# Patient Record
Sex: Female | Born: 1975 | Race: Black or African American | Hispanic: No | Marital: Single | Smoking: Former smoker
Health system: Southern US, Community
[De-identification: ages and names within clinical notes are randomized; demographics above are authoritative.]

## PROBLEM LIST (undated history)

## (undated) DIAGNOSIS — I10 Essential (primary) hypertension: Secondary | ICD-10-CM

## (undated) DIAGNOSIS — E119 Type 2 diabetes mellitus without complications: Secondary | ICD-10-CM

## (undated) DIAGNOSIS — E785 Hyperlipidemia, unspecified: Secondary | ICD-10-CM

## (undated) DIAGNOSIS — F319 Bipolar disorder, unspecified: Secondary | ICD-10-CM

## (undated) DIAGNOSIS — F32A Depression, unspecified: Secondary | ICD-10-CM

## (undated) DIAGNOSIS — F429 Obsessive-compulsive disorder, unspecified: Secondary | ICD-10-CM

## (undated) DIAGNOSIS — F419 Anxiety disorder, unspecified: Secondary | ICD-10-CM

## (undated) HISTORY — PX: OTHER SURGICAL HISTORY: SHX169

## (undated) HISTORY — DX: Type 2 diabetes mellitus without complications: E11.9

## (undated) HISTORY — DX: Essential (primary) hypertension: I10

## (undated) HISTORY — DX: Hyperlipidemia, unspecified: E78.5

## (undated) HISTORY — DX: Depression, unspecified: F32.A

---

## 1997-06-18 ENCOUNTER — Emergency Department (HOSPITAL_COMMUNITY): Admission: EM | Admit: 1997-06-18 | Discharge: 1997-06-18 | Payer: Self-pay | Admitting: Emergency Medicine

## 1997-07-21 ENCOUNTER — Emergency Department (HOSPITAL_COMMUNITY): Admission: EM | Admit: 1997-07-21 | Discharge: 1997-07-21 | Payer: Self-pay | Admitting: Emergency Medicine

## 1997-07-21 ENCOUNTER — Emergency Department (HOSPITAL_COMMUNITY): Admission: EM | Admit: 1997-07-21 | Discharge: 1997-07-21 | Payer: Self-pay | Admitting: *Deleted

## 1998-03-07 DIAGNOSIS — F259 Schizoaffective disorder, unspecified: Secondary | ICD-10-CM

## 1998-03-07 HISTORY — DX: Schizoaffective disorder, unspecified: F25.9

## 1998-05-18 ENCOUNTER — Emergency Department (HOSPITAL_COMMUNITY): Admission: EM | Admit: 1998-05-18 | Discharge: 1998-05-18 | Payer: Self-pay | Admitting: Emergency Medicine

## 1998-07-18 ENCOUNTER — Emergency Department (HOSPITAL_COMMUNITY): Admission: EM | Admit: 1998-07-18 | Discharge: 1998-07-18 | Payer: Self-pay | Admitting: Emergency Medicine

## 1998-08-28 ENCOUNTER — Inpatient Hospital Stay (HOSPITAL_COMMUNITY): Admission: RE | Admit: 1998-08-28 | Discharge: 1998-09-07 | Payer: Self-pay | Admitting: *Deleted

## 1998-10-28 ENCOUNTER — Ambulatory Visit (HOSPITAL_COMMUNITY): Admission: RE | Admit: 1998-10-28 | Discharge: 1998-10-28 | Payer: Self-pay | Admitting: Internal Medicine

## 1998-10-28 ENCOUNTER — Encounter: Payer: Self-pay | Admitting: Internal Medicine

## 1999-10-14 ENCOUNTER — Encounter: Payer: Self-pay | Admitting: Emergency Medicine

## 1999-10-14 ENCOUNTER — Emergency Department (HOSPITAL_COMMUNITY): Admission: EM | Admit: 1999-10-14 | Discharge: 1999-10-14 | Payer: Self-pay | Admitting: Emergency Medicine

## 2000-06-09 ENCOUNTER — Other Ambulatory Visit: Admission: RE | Admit: 2000-06-09 | Discharge: 2000-06-09 | Payer: Self-pay | Admitting: Obstetrics and Gynecology

## 2000-11-13 ENCOUNTER — Emergency Department (HOSPITAL_COMMUNITY): Admission: EM | Admit: 2000-11-13 | Discharge: 2000-11-13 | Payer: Self-pay | Admitting: *Deleted

## 2002-03-28 ENCOUNTER — Other Ambulatory Visit: Admission: RE | Admit: 2002-03-28 | Discharge: 2002-03-28 | Payer: Self-pay | Admitting: Obstetrics and Gynecology

## 2002-04-14 ENCOUNTER — Emergency Department (HOSPITAL_COMMUNITY): Admission: EM | Admit: 2002-04-14 | Discharge: 2002-04-14 | Payer: Self-pay | Admitting: Emergency Medicine

## 2004-02-09 ENCOUNTER — Emergency Department (HOSPITAL_COMMUNITY): Admission: EM | Admit: 2004-02-09 | Discharge: 2004-02-09 | Payer: Self-pay | Admitting: Emergency Medicine

## 2004-02-12 ENCOUNTER — Emergency Department (HOSPITAL_COMMUNITY): Admission: EM | Admit: 2004-02-12 | Discharge: 2004-02-13 | Payer: Self-pay | Admitting: Emergency Medicine

## 2004-03-13 ENCOUNTER — Emergency Department (HOSPITAL_COMMUNITY): Admission: EM | Admit: 2004-03-13 | Discharge: 2004-03-13 | Payer: Self-pay | Admitting: Emergency Medicine

## 2005-01-20 ENCOUNTER — Ambulatory Visit: Payer: Self-pay | Admitting: Psychiatry

## 2005-01-20 ENCOUNTER — Inpatient Hospital Stay (HOSPITAL_COMMUNITY): Admission: EM | Admit: 2005-01-20 | Discharge: 2005-01-25 | Payer: Self-pay | Admitting: Psychiatry

## 2005-03-28 ENCOUNTER — Ambulatory Visit: Payer: Self-pay | Admitting: Internal Medicine

## 2005-12-25 ENCOUNTER — Inpatient Hospital Stay (HOSPITAL_COMMUNITY): Admission: EM | Admit: 2005-12-25 | Discharge: 2006-01-02 | Payer: Self-pay | Admitting: *Deleted

## 2005-12-25 ENCOUNTER — Ambulatory Visit: Payer: Self-pay | Admitting: *Deleted

## 2006-06-16 ENCOUNTER — Inpatient Hospital Stay (HOSPITAL_COMMUNITY): Admission: AD | Admit: 2006-06-16 | Discharge: 2006-06-21 | Payer: Self-pay | Admitting: *Deleted

## 2006-06-16 ENCOUNTER — Ambulatory Visit: Payer: Self-pay | Admitting: *Deleted

## 2006-11-29 ENCOUNTER — Ambulatory Visit: Payer: Self-pay | Admitting: *Deleted

## 2006-11-29 ENCOUNTER — Inpatient Hospital Stay (HOSPITAL_COMMUNITY): Admission: AD | Admit: 2006-11-29 | Discharge: 2006-12-06 | Payer: Self-pay | Admitting: *Deleted

## 2007-02-23 ENCOUNTER — Ambulatory Visit: Payer: Self-pay | Admitting: Psychiatry

## 2007-02-23 ENCOUNTER — Inpatient Hospital Stay (HOSPITAL_COMMUNITY): Admission: AD | Admit: 2007-02-23 | Discharge: 2007-03-01 | Payer: Self-pay | Admitting: Psychiatry

## 2007-10-17 ENCOUNTER — Ambulatory Visit: Payer: Self-pay | Admitting: Obstetrics & Gynecology

## 2007-10-17 ENCOUNTER — Encounter: Payer: Self-pay | Admitting: Obstetrics & Gynecology

## 2008-07-30 ENCOUNTER — Ambulatory Visit: Payer: Self-pay | Admitting: Gastroenterology

## 2010-07-18 ENCOUNTER — Emergency Department (HOSPITAL_COMMUNITY)
Admission: EM | Admit: 2010-07-18 | Discharge: 2010-07-19 | Disposition: A | Discharge status: Other Acute Inpatient Hospital | Payer: Medicare Other | Source: Home / Self Care | Attending: Emergency Medicine | Admitting: Emergency Medicine

## 2010-07-18 DIAGNOSIS — F29 Unspecified psychosis not due to a substance or known physiological condition: Secondary | ICD-10-CM | POA: Insufficient documentation

## 2010-07-18 LAB — COMPREHENSIVE METABOLIC PANEL
ALT: 14 U/L (ref 0–35)
AST: 20 U/L (ref 0–37)
Albumin: 4.6 g/dL (ref 3.5–5.2)
Alkaline Phosphatase: 59 U/L (ref 39–117)
BUN: 19 mg/dL (ref 6–23)
CO2: 22 mEq/L (ref 19–32)
Calcium: 9.2 mg/dL (ref 8.4–10.5)
Chloride: 103 mEq/L (ref 96–112)
Creatinine, Ser: 1.2 mg/dL (ref 0.4–1.2)
GFR calc Af Amer: >60 mL/min (ref >60)
GFR calc non Af Amer: 52 mL/min — ABNORMAL LOW (ref >60)
Glucose, Bld: 164 mg/dL — ABNORMAL HIGH (ref 70–99)
Potassium: 3.6 mEq/L (ref 3.5–5.1)
Sodium: 138 mEq/L (ref 135–145)
Total Bilirubin: 0.3 mg/dL (ref 0.3–1.2)
Total Protein: 8.3 g/dL (ref 6.0–8.3)

## 2010-07-18 LAB — DIFFERENTIAL
Basophils Absolute: 0 10*3/uL (ref 0.0–0.1)
Basophils Relative: 0 % (ref 0–1)
Eosinophils Absolute: 0.2 10*3/uL (ref 0.0–0.7)
Eosinophils Relative: 2 % (ref 0–5)
Lymphocytes Relative: 37 % (ref 12–46)
Lymphs Abs: 3.4 10*3/uL (ref 0.7–4.0)
Monocytes Absolute: 0.9 10*3/uL (ref 0.1–1.0)
Monocytes Relative: 10 % (ref 3–12)
Neutro Abs: 4.6 10*3/uL (ref 1.7–7.7)
Neutrophils Relative %: 51 % (ref 43–77)

## 2010-07-18 LAB — ETHANOL: Alcohol, Ethyl (B): <11 mg/dL — ABNORMAL HIGH (ref 0–10)

## 2010-07-18 LAB — CBC
HCT: 39.6 % (ref 36.0–46.0)
Hemoglobin: 13.9 g/dL (ref 12.0–15.0)
MCH: 30.3 pg (ref 26.0–34.0)
MCHC: 35.1 g/dL (ref 30.0–36.0)
MCV: 86.5 fL (ref 78.0–100.0)
Platelets: 286 10*3/uL (ref 150–400)
RBC: 4.58 MIL/uL (ref 3.87–5.11)
RDW: 13.3 % (ref 11.5–15.5)
WBC: 9 10*3/uL (ref 4.0–10.5)

## 2010-07-19 ENCOUNTER — Inpatient Hospital Stay (HOSPITAL_COMMUNITY)
Admission: AD | Admit: 2010-07-19 | Discharge: 2010-07-27 | DRG: 885 | Disposition: A | Discharge status: Home / Self Care | Payer: Medicare Other | Attending: Psychiatry | Admitting: Psychiatry

## 2010-07-19 DIAGNOSIS — F29 Unspecified psychosis not due to a substance or known physiological condition: Secondary | ICD-10-CM

## 2010-07-19 DIAGNOSIS — F259 Schizoaffective disorder, unspecified: Secondary | ICD-10-CM

## 2010-07-19 DIAGNOSIS — Z560 Unemployment, unspecified: Secondary | ICD-10-CM

## 2010-07-19 LAB — RAPID URINE DRUG SCREEN, HOSP PERFORMED: Barbiturates: NOT DETECTED

## 2010-07-19 LAB — POCT PREGNANCY, URINE: Preg Test, Ur: NEGATIVE

## 2010-07-20 DIAGNOSIS — F259 Schizoaffective disorder, unspecified: Secondary | ICD-10-CM

## 2010-07-20 NOTE — H&P (Signed)
[name], [name]              ACCOUNT NO.:  [id number]   MEDICAL RECORD NO.:  [id number]          PATIENT TYPE:  IPS   LOCATION:  0407                          FACILITY:  [facility]   PHYSICIAN:  Jasmine Pang, M.D. DATE OF BIRTH:  [birth year]   DATE OF ADMISSION:  11/29/2006  DATE OF DISCHARGE:                       PSYCHIATRIC ADMISSION ASSESSMENT   IDENTIFYING INFORMATION:  This is a 36 year old single African-American  female involuntary committed on November 29, 2006.   HISTORY OF PRESENT ILLNESS:  The patient is here on petition, petitioned  per her brother.  Papers state that the patient is manic, agitated,  intrusive, and belligerent, suicidal and cursing.  The patient states  that she has been bored lately and wants to be a famous Immunologist.   PAST PSYCHIATRIC HISTORY:  Last admission was here in April of 2008  where it states the patient was here for full-blown mania.   SOCIAL HISTORY:  This is a 35 year old single African-American female,  lives alone.  She is unemployed and she is hoping to go to school.   FAMILY HISTORY:  None.   ALCOHOL/DRUG HISTORY:  The patient reports some occasional alcohol,  marijuana use.   PRIMARY CARE PHYSICIAN:  None.   MEDICAL PROBLEMS:  No acute or chronic health issues.   MEDICATIONS:  Has been on Depakote, lithium, Seroquel and Zyprexa.  Has  been noncompliant with her medications.   ALLERGIES:  No known allergies.   PHYSICAL EXAMINATION:  GENERAL:  This is a well-nourished female,  overweight and in no acute distress.  Disheveled.  REVIEW OF SYSTEMS:  Noncontributory.  VITAL SIGNS:  Temperature is 98.3, heart rate 85, respirations 20, blood  pressure 115/91, height 5 feet 2 inches tall and weight 168 pounds.  HEAD:  Atraumatic.  NECK:  Negative lymphadenopathy.  CHEST:  Clear, no wheezes or rales.  BREASTS:  Exam was deferred.  HEART:  Regular rate and rhythm.  No murmurs, gallops or rubs.  ABDOMEN:  Soft, nontender  abdomen with bowel sounds.  PELVIC:  Exam was deferred.  GU:  Exam was deferred.  EXTREMITIES:  He moves all extremities.  No clubbing or edema.  SKIN:  Warm and dry with a tattoo noted to left wrist.  NEUROLOGICAL:  Findings are intact and nonfocal.   LABORATORY DATA:  Glucose 123.  Valproic acid level on December 01, 2006 is 56.9.  CBC within normal limits.  Urinalysis is negative.  Urine  pregnancy test is negative.   MENTAL STATUS EXAM:  She is alert, cooperative, little eye contact.  Eating a snack as we are talking.  Speech is clear but somewhat  tangential, short sentences.  Mood is neutral.  The patient is mildly  irritable but not threatening.  She not cursing.  Thought processes with  some evidence of grandiosity.  No apparent hallucinations.  Cognitive  function intact.  Memory is good.  Judgment and insight are poor.  She  is a poor historian.   DIAGNOSES:  AXIS I:  Bipolar, manic versus mixed.  AXIS II:  Deferred.  AXIS III:  No major medical issues.  AXIS IV:  Other psychosocial problems related to burden of illness.  AXIS V:  Current 35.   PLAN:  Contract for safety.  Stabilize mood and thinking.  Will continue  with the Depakote.  Will have Zyprexa available on a p.r.n. basis.  We  will continue to do reality testing.  Work on medication compliance.  Will do a family session with mother and casemanager is to obtain her  follow-up.   TENTATIVE LENGTH OF STAY:  Five to seven days.      [name], N.P.      Jasmine Pang, M.D.  Electronically Signed    JO/MEDQ  D:  12/04/2006  T:  12/04/2006  Job:  [id number]

## 2010-07-20 NOTE — Group Therapy Note (Signed)
[name], [name]              ACCOUNT NO.:  [id number]   MEDICAL RECORD NO.:  [id number]          PATIENT TYPE:  WOC   LOCATION:  [facility]                   FACILITY:  [facility]   PHYSICIAN:  Allie Bossier, MD        DATE OF BIRTH:  September 03, 1975   DATE OF SERVICE:  10/17/2007                                  CLINIC NOTE   [name] is here for her annual exam.  She has a Pap smear and she  has a past medical history of manic depression, diagnosed when she was  age 35, that is being controlled with Depakote and Zyprexa.  She is  seeing a physician for those prescriptions.  She also was diagnosed with  an ovarian cyst in 2005.  She is currently abstinent but has been  sexually active in the past.  She uses condoms as needed.   She has a family history of mother having hypothyroidism, which is  controlled now.  Father is currently deceased but had a list of problems  of hypertension, coronary artery disease, CVA, prostate cancer, and  kidney failure.   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  She drinks 8-10 alcoholic drinks per week during the  weekend and uses no tobacco.   She has never had any surgical procedures.   No allergies.  She is not allergic to latex and has never had an  abnormal Pap in the past.   PHYSICAL EXAMINATION:  No lymph nodes palpated.  BREASTS:  Normal.  No masses, lumps, or lesions found.  No palpable  lymph nodes in the axilla.  ABDOMEN:  Soft and nontender to palpation.  Positive bowel sounds  throughout.  LUNGS:  Clear to auscultation bilaterally.  HEART:  Regular rate and rhythm.  No murmurs noted.  PELVIC:  Pap smear was done, noted a small amount of white cervical  vaginal discharge and a wet-prep was also done at that time.  Pap smear  performed and bimanual exam noted no tenderness on the cervix or uterus.  No adnexal masses, but she did have left-sided adnexal tenderness to  palpation.  She does  note that she has tenderness on the left side in  this region during her menstrual periods.   ASSESSMENT:  Normal GYN exam.   Follow up will be 1 year or sooner as needed.  The patient was  instructed on self breast exam per month at home.  The patient was asked  if she wanted to be on any birth control and she denied any birth  control at this time, and she denied that she need any birth control  since when she has sex, she has protected sex.  We also ran a GC and  Chlamydia screen for her at this time.  Again, followup will be in 1  year for repeat Pap.     Allie Bossier, MD    MCD/MEDQ  D:  10/17/2007  T:  10/18/2007  Job:  [id number]

## 2010-07-20 NOTE — H&P (Signed)
[name], [name]              ACCOUNT NO.:  [id number]   MEDICAL RECORD NO.:  [id number]          PATIENT TYPE:  IPS   LOCATION:  0402                          [facility]   PHYSICIAN:  Barbara Jungling, MD  DATE OF BIRTH:  23-Aug-1975   DATE OF ADMISSION:  02/23/2007  DATE OF DISCHARGE:                       PSYCHIATRIC ADMISSION ASSESSMENT   CHIEF COMPLAINT:  This is an involuntary admission to the services of  [name].  The patient reports that she had called her mother  and asked her to bring her some food.  She ended up being committed.  Apparently she has been noncompliant with her medication  since her last  admission here, and was fully manic.   When seen at [name], the respondent was noted to  be manic, verbally aggressive.  She had deteriorated once again because  she will not take her medication.  She did not get her last prescription  filled at all after being in [name] for over a month ago.  She was  threatening to kill her mother.  She was totally uncontrolled during the  interview, extremely loud, rude, labile, and threatening.  She continues  in the above manner today, despite having had several doses of Zyprexa.  Today the patient states she will take the Depakote, and Depakote will  be restarted today.   PAST PSYCHIATRIC HISTORY:  [name] was last with [place] here at the  [name] 11/29/2006 to 12/06/2006.  Prior to that she  had been here in 06/2006, when she was also in full-blown mania.   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   FAMILY HISTORY:  She lives alone, and reports that she is currently  employed.   FAMILY HISTORY:  None.   ALCOHOL AND DRUG HISTORY:  She reports occasional alcohol and marijuana  use.  She would not allow [place] to draw her labs, and I cannot confirm  whether or not she  is using substances at this time.  Her primary care  physician:  She does not have one.  Her psychiatrist:  She is followed  at [name] by [name].   MEDICAL PROBLEMS:  None are known.  However, she does appear to have  some degree of exophthalmus.  Her TSH has not been able to be checked.  We will be checking on that.   CURRENT MEDICATIONS:  When last discharged on 12/06/2006, discharge  medications were:  1. Zyprexa 20 mg at h.s.  2. Depakote ER 1500 mg at h.s.   ALLERGIES:  No known drug allergies.   POSITIVE PHYSICAL FINDINGS:  IN GENERAL:  She appears to be a well-  developed, well-nourished, African American female who appears her  stated age.  VITAL SIGNS ON ADMISSION:  Height 62 inches tall, weight 156,  temperature 99.7. blood pressure 133/93 to 129/81, pulse 82 to 89,  respirations 18.  She refuses to participate in any other part of  the  exam or to allow her labs to be drawn at this time.   MENTAL STATUS EXAM:  Today she is casually groomed and dressed.  She has  good eye contact.  Her motor behavior is restless.  She gets up and  leaves the interview several times.  Her speech is normal.  Her level of  consciousness is alert.  Her mood is quite labile.  She has bursts of  unusual laughter.  Her affect is inappropriate at times.  Her anxiety  level is moderate.  Her thought processes are coherent.  She knows her  rights, and she is oriented.  Her judgment is very poor.  Concentration  is decreased.  Her recent memory is intact, as is remote.  Her IQ is at  least average and, as already stated, her insight, impulse control are  poor.  She reports poor appetite and poor sleep.   ADMITTING IMPRESSION:   AXIS I:  Bipolar, manic, due to noncompliance with prescribed  medications.   AXIS II:  Deferred.   AXIS III:  Rule out hyperthyroid disease.   AXIS IV:  Chronic mental illness due to noncompliance of medications.  Problems with  primary support group and occupational issues.   AXIS V:  30.   PLAN:  1. Admit for safety and stabilization.  2. We will re-establish compliance with her medications.  3. Check thyroid.   ESTIMATED LENGTH OF STAY:  Three to 4 days.      [name], P.A.-C.      Barbara Jungling, MD  Electronically Signed    MD/MEDQ  D:  02/24/2007  T:  02/25/2007  Job:  [phone]

## 2010-07-23 NOTE — Discharge Summary (Signed)
[name], [name]              ACCOUNT NO.:  [id number]   MEDICAL RECORD NO.:  [id number]          PATIENT TYPE:  IPS   LOCATION:  0403                          [facility]   PHYSICIAN:  Jasmine Pang, M.D. DATE OF BIRTH:  Nov 23, 1975   DATE OF ADMISSION:  06/16/2006  DATE OF DISCHARGE:  06/21/2006                               DISCHARGE SUMMARY   IDENTIFICATION:  This is an involuntary admission on June 16, 2006 for  this 35 year old single African American female.   HISTORY OF PRESENT ILLNESS:  Her commitment papers indicate that she was  floridly psychotic at the time of her presentation to the [facility].  They state she was in a full blown mania.  She had lost her job  the day before for trying to give a white man sugar then cursing him  out.  She apparently went to her mother's house but was belligerent and  verbally aggressive around her mother and brother.  They brought her to  [facility].  She was noted to be deteriorating rapidly and cannot  survive within the community in this present condition.  Apparently she  stopped her Depakote at least a week ago and was refusing medications.  The patient was last admitted here October 21 to October 29.  At that  time she was bipolar manic with psychosis and was noted to be abusing  marijuana.   PHYSICAL FINDINGS:  The patient refused a physical exam.  There were no  acute physical or medical problems noted.   ADMISSION LABORATORIES:  The patient refused.   HOSPITAL COURSE:  Upon admission the patient was started on Depakote ER  1000 mg p.o. at bedtime and Zyprexa Zydis 10 mg q.a.m. and 20 mg at  bedtime.  On June 17, 2006 the patient was given a one-time dose of  Zydis 20 mg now due to severe agitation.  On June 17, 2006 Haldol 5 mg  p.o. or IM q.6 hours p.r.n. agitation was ordered.  A dose of Haldol 10  mg was also given now.  Cogentin 1 mg p.o. or IM q.6 hours p.r.n. EPS  was ordered.  On June 18, 2006 an a.m. Depakote level was ordered with  CBC and hepatic profile and hypothyroid panel since this had not been  done.  However, the patient was refusing blood drawing.  On June 18, 2006 the patient was given Haldol 5 mg now due to agitation.  On June 18, 2006 later in the day, Zyprexa Zydis 10 mg now times one was given  for agitation.  The patient tolerated her medications well with no  significant side effects   Upon first meeting the patient she was very agitated and rolling around  on the floor when I saw her.  She required p.r.n. Zyprexa and Haldol as  above.  She had been making threats to kill her mother and also threats  to kill herself.  On June 18, 2006 the patient slept well.  Appetite  was good.  Mood was depressed and anxious.  She was hyperverbal and  extremely  irritable, cursing, angry at her mother for not bringing her  clothes.  Thoughts were circumstantial.  Depakote ER 1000 mg p.o. at  bedtime was continued as well as the Zyprexa 10 mg q.a.m. and 20 mg at  bedtime.  On June 19, 2006 the patient slept just a couple of hours.  She was depressed, anxious and irritable.  Affect constricted, sleepy.  She became angry when I told her she was not going home today.  She  became agitated and terminated the conversation with me.  She had some  disorganized thinking.  On June 20, 2006 the patient was calmer.  She  had slept well.  Appetite good.  She was anxious about her job.  She  stated she does not want to lose it (though apparently she had already  been fired).  She is very sedate and kept falling asleep as we talked.  She wanted to know when she will go home.  She accepted that it would  not be today.   On June 21, 2006 mental status had improved markedly from admission.  The patient was friendly, cooperative, talkative.  She had good eye  contact.  Speech was normal rate and flow.  Psychomotor activity was  within normal limits.  Mood was euthymic.  Affect  wide range.  There was  no suicidal or homicidal ideation.  No thoughts of self-injurious  behavior.  No auditory or visual hallucinations.  No paranoia or  delusions.  Thoughts were logical and goal-directed.  Thought content no  predominant theme.  Cognitive was grossly within normal limits..  The  patient was felt safe to be discharged today after the family session  with her mother.   DISCHARGE DIAGNOSES:  AXIS I:  Bipolar disorder, manic with psychosis.  AXIS II:  None.  AXIS III:  No known illnesses.  AXIS IV:  Severe (problems with primary support group, occupational  problems, burden of psychiatric illness).  AXIS V:  GAF upon discharge was 48.  GAF upon admission was 22.  GAF  highest past year was 65.   DISCHARGE PLANS:  There were no specific activity level or dietary  restrictions.   DISCHARGE MEDICATIONS:  1. Depakote ER 1000 mg p.o. at bedtime.  2. Zyprexa 10 mg in the morning and 20 mg at bedtime.   POSTHOSPITAL CARE PLANS:  The patient will be connected with the [facility] team.  They will contact her upon discharge.      Jasmine Pang, M.D.  Electronically Signed     BHS/MEDQ  D:  06/21/2006  T:  06/21/2006  Job:  [id number]

## 2010-07-23 NOTE — Discharge Summary (Signed)
NAMECHARINA, FONS              ACCOUNT NO.:  [id number]   MEDICAL RECORD NO.:  [id number]          PATIENT TYPE:  IPS   LOCATION:  0407                          [facility]ACILITY:  [facility]   PHYSICIAN:  Anselm Jungling, MD  DATE OF BIRTH:  [birth year]   DATE OF ADMISSION:  11/29/2006  DATE OF DISCHARGE:  12/06/2006                               DISCHARGE SUMMARY   IDENTIFYING DATA/REASON FOR ADMISSION:  This was an inpatient  psychiatric admission for [name], a 35 year old single African-American  female with a history of bipolar disorder.  This was a repeat admission  for her.  She is currently a client of [facility],  and was admitted due to increasing symptoms of manic psychosis.  She  reported that she had been taking her outpatient medications correctly.  Please refer to the admission note for further details pertaining to the  symptoms, circumstances and history that led to her hospitalization.   INITIAL DIAGNOSTIC IMPRESSION:  She was given an initial AXIS I  diagnosis of bipolar disorder, currently manic with psychotic features.   MEDICAL/LABORATORY:  The patient was medically and physically assessed  by the psychiatric nurse practitioner.  She was in good health without  any active or chronic medical problems.   HOSPITAL COURSE:  The patient was admitted to the adult inpatient  psychiatric service.  She was initially seen by Dr. Milford Cage.  The  undersigned assumed care of the patient on the fifth hospital day.  At  that time, the patient had stabilized significantly on her regimen of  Zyprexa and Depakote.  She was pleasant, and appropriate, but still a  bit edgy and irritable.  She was making no delusional statements.  She  was sleeping fairly well.   Her mother was contacted regarding family session, which occurred later  that day.  Her mother indicated that she did not yet feel that the  patient was back to her baseline level of functioning.   The  patient remained in the inpatient setting another two days while her  medications were continued.  On the seventh hospital day, the patient  appeared appropriate for discharge.  At that time, she was pleasant,  calm, nonirritable, rational, and showed very good insight into her  illness and need for ongoing medication management.  She agreed to the  following aftercare plan.   AFTERCARE:  The patient was to follow up with the [facility] with  an appointment with her psychiatrist on the day following discharge,  December 07, 2006.   DISCHARGE MEDICATIONS:  1. Zyprexa 20 mg q.h.s.  2. Depakote ER 1500 mg q.h.s.   DISCHARGE DIAGNOSES:  AXIS I:  Bipolar disorder, manic phase, early  remission.  AXIS II:  Deferred.  AXIS III:  No acute or chronic illnesses.  AXIS IV:  Stressors:  Severe.  AXIS V:  GAF on discharge 55.      Anselm Jungling, MD  Electronically Signed     SPB/MEDQ  D:  12/07/2006  T:  12/07/2006  Job:  [id number]

## 2010-07-23 NOTE — Discharge Summary (Signed)
[name], [name]              ACCOUNT NO.:  [id number]   MEDICAL RECORD NO.:  [id number]          PATIENT TYPE:  IPS   LOCATION:  0403                          FACILITY:  [facility]   PHYSICIAN:  Jasmine Pang, M.D. DATE OF BIRTH:  May 18, 1975   DATE OF ADMISSION:  12/25/2005  DATE OF DISCHARGE:  01/02/2006                                 DISCHARGE SUMMARY   IDENTIFYING INFORMATION:  The patient is a 35 year old single African  American female who was admitted on an involuntary basis on December 25, 2005.  History was obtained primary from the petition.  The papers state  patient is a danger to herself and others.  They state she has been acting  in an aggressive, hostile manner and cursing people out.  She has been angry  at her mother and has been verbally abusive towards her.  She reports  decreased sleep and changes in appetite.  No particular weight loss.  She  denies suicidal ideation.  Denies homicidal ideation.  Denies psychosis.  She has a history of bipolar disorder.  This is the second psychiatric  admission for this patient.  She was here in 2006.  She was a patient of  [facility].  She has had no history of suicide  attempts.  The medications Depakote 1000 mg p.o. q.h.s.  For further  psychiatric admission information, see psychiatric admission assessment.   PHYSICAL EXAMINATION:  Well-nourished, well-developed young female without  acute distress.  Her physical exam was within normal limits.   LABORATORY DATA:  Alcohol level was less than 5.  Comprehensive metabolic  panel was remarkable for potassium of 3.4 (3.5-5.1) and glucose 102 (70-99).  A.m. Depakote level was 80.8 (50-100).  CBC was within normal limits.  TSH  was 0.875 which was within normal limits.   HOSPITAL COURSE:  Upon admission, the patient was continued on her Depakote  1000 mg p.o. q.h.s.  She was also started on Abilify 15 mg p.o. q.h.s.  On  December 26, 2005, the patient  was started on trazodone 50 mg p.o. q.h.s.  p.r.n. insomnia.  She was also started on Geodon 20 mg IM x1 for emergency  sedation only.  She was started on Zyprexa Zydis 5 mg p.o. q.6h. p.r.n.  agitation.  On December 26, 2005, the Zyprexa Zydis was discontinued.  Instead, she was started on Zyprexa Zydis 10 mg p.o. q.6h. p.r.n. agitation  or psychosis, not to exceed 30 mg daily.  She was also started on Ativan 1  mg p.o. q.6h. p.r.n. agitation.  On December 28, 2005, the patient's Abilify  was discontinued.  Instead, she was started on Zyprexa 10 mg in the morning  and 20 mg at h.s.  Total dose of Zyprexa in a 24-hour period not to exceed  40 mg including the p.r.n.  The patient tolerated her medications well  except for some sedation.   Upon first meeting the patient, she was very irritable and agitated.  She  was cursing about her mother.  She was also cursing to me and other staff.  She was  angry because I will not let her go home yet.  On December 27, 2005,  the patient continued to be very irritable and angry with me.  She was  cursing and telling me there was no way she would stay as long as I am  saying.  She became hostile with me and said she would not talked anymore.  Then, she became upset when I got up to leave.  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  She was angered by that.   On December 28, 2005, the patient had remained confused with inappropriate  behavior.  She is walking down the hallway in front of the patients in her  underwear.  She could not member who I was are you my nurse.  She was able  to be directed to put some clothes on.  On December 29, 2005, the patient  talked about an argument with her mother.  She had fast pressured speech.  She became agitated and upset.  She cursed towards her mother.  She  was also  agitated with me when told she would have to stay here for five days or  longer.  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  She does not have insight to know she would jeopardize her job should she  return in her state.  She was agitated with other peers on the unit and  staff.  On December 30, 2005, the patient was somewhat less irritable today.  She was able to talk more calmly and less angrily.  She was upset because  she cannot go home but able to handle this better.  She was able to be  redirected when she begins to get agitated.   On December 31, 2005, the patient remained pressured regarding discharge.  She stated I have a job.  She spoke at length about this with the on-call  doctor for the weekend.  She seemed to have minimal insight.  On January 01, 2006, the patient continued to insist that she is ready for discharge.  She  did not seem to absorb the message that the plan is to stay through the  weekend until I could see her tomorrow.  Dr. Electa Sniff did not feel she had  made enough progress to discharge that day.  On January 02, 2006, the  patient's mental status had improved markedly from admission status.  She  was friendly and cooperative with good eye contact.  Speech normal rate and  flow thought a little pressured.  Psychomotor activity was within normal  limits.  She had good eye contact.  Mood was less irritable and anxious and  depressed.  Her affect revealed wider range.  There was no suicidal or  homicidal ideation.  There was no history of self-injurious behavior.  No  auditory or visual hallucinations.  No delusions or paranoia.  Thoughts were  logical and goal directed.  Thought content wants to get home to go back to  work.  The cognitive was grossly within normal limits, back to baseline.   DISCHARGE DIAGNOSES:  AXIS I:  Bipolar disorder, manic severe with  psychosis.  THC abuse.  AXIS II:  None. AXIS III:  No acute or chronic  illness.  AXIS IV:  Severe (occupational problem, housing problem, economic problem,  other psychosocial problems as well as the burden of her illness).  AXIS V:  GAF upon discharge 50; GAF upon admission 35; GAF highest past year  65.   ACTIVITY/DIET:  There were no specific activity level or dietary  restrictions.   DISCHARGE MEDICATIONS:  1. Depakote ER 500 mg, 2 pills at bedtime.  2. Zyprexa 10 mg daily in the morning and 20 mg at bedtime.   POST-HOSPITAL CARE PLANS:  The patient will be seen at the [facility]  for follow-up.  This appointment will be made by our casemanager.      Jasmine Pang, M.D.  Electronically Signed     BHS/MEDQ  D:  01/02/2006  T:  01/02/2006  Job:  [id number]

## 2010-07-23 NOTE — Discharge Summary (Signed)
[name], [name]              ACCOUNT NO.:  [id number]   MEDICAL RECORD NO.:  [id number]          PATIENT TYPE:  IPS   LOCATION:  0402                          FACILITY:  [facility]   PHYSICIAN:  Jasmine Pang, M.D. DATE OF BIRTH:  [birth year]   DATE OF ADMISSION:  02/23/2007  DATE OF DISCHARGE:  03/01/2007                               DISCHARGE SUMMARY   IDENTIFICATION:  This is a 35 year old single female who was admitted on  voluntary admission on February 23, 2007.   HISTORY OF PRESENT ILLNESS:  The patient reports that she had called her  mother and asked her to bring some food.  She ended up being committed.  Apparently, she has been noncompliant with her medications since her  last admission here and was fully manic.  When seen at the [facility] mental [facility] center, the respondent was noted to be manic and  verbally aggressive.  She deteriorated once again because she would not  take her medications.  She did not get her last prescription filled at  all.  After being in [place] for over a month ago, she was  threatening to kill her mother.  She was totally uncontrolled during the  interview, being extremely loud, labile and threatening.  She continued  in the above manner on the day of admission to the hospital, despite  having several doses of Zyprexa.  She did finally agreed to take  Depakote.  Shandora was last  with this in the [facility],  November 29, 2006 to December 06, 2006.  Prior to that she had been here  in April 2008 with full blown mania.  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  She apparently does well at work unless  she becomes manic.  She was currently seen at [facility] by Dr.  De Nurse.   She has no known medical problems.  When discharged on December 06, 2006,  her discharge medications were Zyprexa 20 mg h.s. and Depakote ER 1500  mg h.s.   She has no known drug allergies.   PHYSICAL FINDINGS:   The patient appears to be a well-developed, well-  nourished [place] American female who appears her stated age.  She was  in no acute distress.  She she had no acute medical or physical  problems.   HOSPITAL COURSE:  Upon admission, the patient was started on Zyprexa  Zydis 10 mg p.o. q.h.s. and Zyprexa Zydis 10 mg p.o. q.6 hours p.r.n.  agitation not to exceed 30 mg in the 24-hour period.  On February 24, 2007, the patient was started on Depakote ER 500 mg now then 1500 mg  p.o. q.h.s.  On February 25, 2007, the patient was changed to Zyprexa 15  mg p.o. q.h.s. and Zyprexa Zydis 10 mg p.o. q.4 hours p.r.n. agitation.  The patient tolerated these medications well, although she was sedated  initially and had dry mouth.  As indicated above, she was initially very  hostile and loud and profane.  She was cursing.  She was racially  insensitive, using  racial insensitive language.  She upset her peers on  the unit by changing the television station to what she wanted to see.  At hospitalization progress, she continued to have flight of ideas,  pressured speech and hyper verbal.  Her mood was angry and irritable.  She stated she wanted her mother to die, but was not threatening to harm  her.  She was demanding discharge.  As hospitalization progressed  further mental status improved.  She continued to focus on wanting to go  home and would become temporarily upset when she found out she could not  leave each day.  She stated she was afraid she would lose her job.  She  was unable to have insight that she was in no shape to work at this  point.  Sleep improved.  Appetite was good.  Mood began to stabilize.  On March 01, 2007, mental status had improved.  The patient was  pleasant and well organized.  She stated she knows she needed to take  the prescription, to be the kind of person I want to be.  She had no  suicidal or homicidal ideation.  Mood was less irritable, less angry,  and affect  consistent with mood.  There were no thoughts of self  injurious behavior.  No auditory or visual hallucinations.  No paranoia  or delusions.  Thoughts were logical and goal directed and it was felt  the patient was able to be discharged safely today.   DISCHARGE DIAGNOSES:  Axis I:  Bipolar disorder, manic, and severe.  Axis II:  None.  Axis III:  None.  Axis IV:  Severe (chronic mental illness due to noncompliance of  medications).  Problems with primary support group and occupational  issues.  Axis V:  GAF was 45 and on discharge.  GAF was 30 upon admission.  GAF  highest past year was 65-70.   DISCHARGE/PLAN:  There were no specific activity level or dietary  restrictions.   POST HOSPITAL CARE PLANS:  The patient will be seen at the [facility] by Dr. Joni Reining for re-entry appointment.   POST DISCHARGE MEDICATIONS:  1. Zyprexa 15 mg p.o. q.h.s.  2. Depakote ER 1500 mg p.o. q.h.s.      Jasmine Pang, M.D.  Electronically Signed     BHS/MEDQ  D:  03/22/2007  T:  03/23/2007  Job:  [id number]

## 2010-07-23 NOTE — Discharge Summary (Signed)
NAMETAIS, KOESTNER              ACCOUNT NO.:  [id number]   MEDICAL RECORD NO.:  [id number]          PATIENT TYPE:  IPS   LOCATION:  0403                          FACILITY:  [facility]   PHYSICIAN:  Jasmine Pang, M.D. DATE OF BIRTH:  October 19, 1975   DATE OF ADMISSION:  12/25/2005  DATE OF DISCHARGE:  01/02/2006                               DISCHARGE SUMMARY   IDENTIFYING INFORMATION:  A 35 year old single African American female  who was admitted on an involuntary basis on December 25, 2005.   HISTORY OF PRESENT ILLNESS:  The patient's history of petition paper  states she is a danger to herself and others.  She has been aggressive,  hostile, cursing.  She was angry at her mother for the commitment.  She  reports decreased sleep, changes in appetite.  No apparent weight loss.  She denies suicidal or homicidal ideation or psychosis.  Stressor  revolved around [place] in her apartment.  This is the second  psychiatric admission here.  The patient was at [facility] in 2006.  She is the patient of [facility].  She has had no history of suicide attempts.   MEDICATIONS:  Depakote 1000 mg p.o. q.h.s.   For further admission information see psychiatric admission assessment.   PHYSICAL FINDINGS:  There were no acute physical distress noted on exam.   CBC was within normal limits.  Potassium was 3.4, glucose 102.  Alcohol  level less than 5.  TSH was 0.875 and valproic acid was 80 (50-100).   HOSPITAL COURSE:  Upon admission the patient was continued on her home  medications of Depakote 1000 mg p.o. q.h.s. and Abilify 15 mg p.o.  q.h.s.  She was also started on trazodone 50 mg p.o. q.h.s. p.r.n. and  Geodon 20 mg IM x1 for agitation and sedation only.  She was started on  Zyprexa Zydis 5 mg p.o. q.6 h p.r.n. agitation.  On December 26, 2005 the  patient's Zyprexa was changed to 10 mg p.o. q.6 h p.o. p.r.n. agitation  or psychosis not  to exceed 30 in a day.  She was also placed on Ativan 1  mg p.o. q.6 h p.r.n. agitation.  On December 28, 2005 Abilify was  discontinued.  She was begun on Zyprexa 10 mg p.o. q.a.m. and 20 mg p.o.  q.h.s.  Total dose of Zyprexa not to exceed 40 mg in a 24-hour period  including p.r.n.'s.   Upon first meeting the patient she was very irritable and agitated.  She  was cursing about her mother.  She was also cursing to me and other  staff.  She was angry because I would not let her go home yet.  On  December 27, 2005 the patient was very irritable and angry with me.  She  was cursing and telling me there would be no way she would stay as long  as I am saying.  She became hostile with me and said she would not talk  to me any more.  Then she became upset when I got up  to leave.  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  On December 28, 2005 the patient remained confused with inappropriate behavior.  She  was walking down the hallway in front of patients in her underwear.  She  could not remember who I was (are you my nurse?).  She was able to be  directed and put some clothes on finally.  On December 29, 2005 the  patient had fast pressured speech.  She became agitated and upset.  She  cursed towards her mother.  She was also agitated with me when told she  would have to stay here for 5 days or longer.  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  She does not have the insight to  know that she would jeopardize her job should she go to work with her  current mental status.  She became agitated with other peers on the unit  and the staff.  On December 30, 2005 the patient was somewhat less  irritable.  She was able to talk more calmly and less angrily.  She was  still upset because she could not go home.  She was able to be  redirected when she became agitated. On December 31, 2005 the patient met  with the on-call physician.  She was very focused on  getting back to her  job.  She had fixed eye contact, blunted and constricted affect, minimal  insight.  On January 01, 2006 the patient continued to be pressured  insisting that she was ready for discharge.  She seemed not to absorb  the message that the plan is for her to stay through the weekend until I  could see her the following day.  Her insight was very limited.    On 01/02/2006 mental status had improved markedly from admission status.  She was friendlier, more cooperative, conversant.  She had good eye  contact.  Speech was normal rate and flow.  Psychomotor activity was  within normal limits.  Mood was euthymic though still slightly  irritable.  Affect wide range.  There was no suicidal or homicidal  ideation.  No self injurious behavior.  No auditory or visual  hallucinations.  Thoughts were logical and goal-directed.  Thought  content; no predominant theme.  Cognitive was grossly within normal  limits.  It was felt that she was safe to return home today.   DISCHARGE DIAGNOSES:  AXIS I: Bipolar disorder, manic, severe with  psychosis. THC abuse.  AXIS II: None.  AXIS III: No acute or chronic illness.  AXIS IV: Severe (occupational problem, housing problem, economic  problem, other psychosocial problem, burden of illness).  AXIS V: GAF upon discharge was 47.  GAF upon admission was 35.  GAF  highest past year was 65.   DISCHARGE/PLAN:  There were no specific activity level or dietary  restrictions.   DISCHARGE MEDICATIONS:  1. Depakote ER 500 mg, 2 pills at bedtime.  2. Zyprexa 10 mg daily in the morning and 20 mg at bedtime.   POST HOSPITAL CARE PLANS:  The patient is to return to the [facility] to see Dr. Lang Snow for a reentry meeting.      Jasmine Pang, M.D.  Electronically Signed     BHS/MEDQ  D:  02/15/2006  T:  02/15/2006  Job:  [id number]

## 2010-07-23 NOTE — Discharge Summary (Signed)
NAMEJAQUILLA, WOODROOF              ACCOUNT NO.:  [id number]   MEDICAL RECORD NO.:  [id number]          PATIENT TYPE:  IPS   LOCATION:  0402                          FACILITY:  [facility]   PHYSICIAN:  Anselm Jungling, MD  DATE OF BIRTH:  Jul 22, 1975   DATE OF ADMISSION:  01/20/2005  DATE OF DISCHARGE:  01/25/2005                                 DISCHARGE SUMMARY   IDENTIFYING DATA AND REASON FOR ADMISSION:  This was the first [facility] admission  for Ronda, a 34 year old single African-American female who was admitted on  an involuntary basis. She came to [place] with a history of bipolar disorder, and  had been not taking her usual Depakote. As such, she was assessed by  [facility] mental health where she was found to be in a manic state and  referred for inpatient treatment. She was able to acknowledge fast  thoughts and mixed up thinking.  Please refer to the admission note for  further details pertaining to the symptoms, circumstances and history that  led to her hospitalization. She was given an initial Axis I diagnosis of  bipolar disorder, type 1, manic.   The patient had been on a regimen of Depakote 1000 milligrams q.h.s.Marland Kitchen   MEDICAL AND LABORATORY:  The patient is essentially in good health without  any active or chronic medical problems. There were no significant medical  issues during this brief inpatient psychiatric stay. She was physically and  medically assessed by the nurse practitioner at the onset of her treatment.   HOSPITAL COURSE:  The patient was admitted to the adult inpatient service  where she was restarted on her usual regimen of Depakote ER 1000 milligrams  q.h.s.. A serum pregnancy test was confirmed as negative. Zyprexa 10  milligrams was given on a one-time basis to address agitation, and Ativan  was made available on a p.r.n. basis to address agitation. Subsequently,  Zyprexa was discontinued in favor of a trial of low-dose Risperdal 0.5  milligrams q.h.s. This  did not appear to be to the patient's liking, and was  it was discontinued as well.   Over the 6 days of her inpatient stay, the patient gradually stabilized with  respect to mood, becoming gradually less manic, and merely euthymic at the  time of discharge. She was able to acknowledge that she had a relapsed into  a manic state due to medication noncompliance, and pledged that she would  continue to take her medication more regularly.   AFTERCARE:  The patient was discharged on a regimen of Depakote ER 1000  milligrams q.h.s.. She was to follow-up with Dr. Lang Snow at the [facility] on 02/01/2005 for medication management.   DISCHARGE DIAGNOSES:  AXIS I: Bipolar affective disorder, most recently  manic.  AXIS II: Deferred.  AXIS III: No acute or chronic illnesses.  AXIS IV: Stressors moderate.  AXIS V: GAF on discharge 65.           ______________________________  Anselm Jungling, MD  Electronically Signed     SPB/MEDQ  D:  01/25/2005  T:  01/25/2005  Job:  [id number] 

## 2010-07-23 NOTE — H&P (Signed)
[name], [name]              ACCOUNT NO.:  [id number]   MEDICAL RECORD NO.:  [id number]          PATIENT TYPE:  IPS   LOCATION:  0403                          FACILITY:  [facility]   PHYSICIAN:  Vic Ripper, P.A.-C.DATE OF BIRTH:  1975/12/21   DATE OF ADMISSION:  06/16/2006  DATE OF DISCHARGE:                       PSYCHIATRIC ADMISSION ASSESSMENT   PSYCHIATRIC ADMISSION ASSESSMENT:  This is an involuntary admission to  the services of Dr. Milford Cage.   HISTORY OF PRESENT ILLNESS:  This is a 35 year old single African-  American female.  Her commitment papers indicate that she was floridly  psychotic at the time of her presentation to the [facility] and in  full-blown mania.  She had lost her job yesterday for trying to give a  white man sugar, then cursing him out.  She apparently went to her  mother's house but was belligerent and verbally aggressive around her  mother and her brother, so they brought her to the [facility].  She  was noted to be deteriorating rapidly and cannot survive within the  community in her present condition.  Apparently she stopped her Depakote  at least a week ago and was refusing medications.   PAST PSYCHIATRIC HISTORY:  She was last admitted here October 21 to  October 29.  Also at that time she was bipolar manic with psychosis, and  she was noted to be abusing marijuana at that point in time.   Social history, family history, alcohol and drug history are  unobtainable.  Patient is not cooperative with exam.  She does not seem  to have a primary care physician. She was supposed to have been on  Depakote-ER 1000 mg at h.s. and Seroquel 25 mg at h.s.  However, she had  discontinued meds at least a week ago.   No known drug allergies.   PHYSICAL FINDINGS:  She refuses an exam.   Her vital signs on admission show that she is 63 inches tall, she weighs  140, temperature is 100.6, blood pressure is 137/101, pulse is 118,  respirations  64.  She is alert.  However, she remains manic and  psychotic.  It is difficult to do a thorough assessment as she refuses  to speak.   DIAGNOSES:  AXIS I:  Bipolar manic, noncompliant with medications.  AXIS II:  Deferred.  AXIS III:  No known illnesses.  AXIS IV:  Severe; problems with primary support group and occupation.  AXIS V:  Her GAF on admission was 22.   We admitted her for safety and stabilization.  We restarted her  discharge meds from her last admission, which were Depakote 1000 mg and  Zyprexa 20 mg.  This morning she has had another 10 mg of Zyprexa as  well as 10 of Haldol, and she is not slowing up yet.  We will continue  to adjust her medications until she is free of mania and will return her  to outpatient followup at the [facility].      Vic Ripper, P.A.-C.     MD/MEDQ  D:  06/17/2006  T:  06/17/2006  Job:  [id number] 

## 2010-07-25 NOTE — H&P (Signed)
NAMEMAGNOLIA, [name]              ACCOUNT NO.:  [id number]  MEDICAL RECORD NO.:  [id number]           PATIENT TYPE:  I  LOCATION:  0403                          FACILITY:  [facility]  PHYSICIAN:  Eulogio Ditch, MD DATE OF BIRTH:  [birth year]  DATE OF ADMISSION:  07/19/2010 DATE OF DISCHARGE:                      PSYCHIATRIC ADMISSION ASSESSMENT   IDENTIFICATION:  This is a 35 year old female here on involuntary papers, admitted on Jul 19, 2010.  HISTORY OF PRESENT ILLNESS:  This is a 35 year old female who was admitted on Jul 19, 2010.  The patient presented to the emergency department with paranoid thinking, anger, accusing people stealing her money and making porn movies and has been noncompliant with the medication because she states "she does not need them."  The patient reports that she has been living with ghosts, one of them being [name]. She denies any suicidal or homicidal thoughts.  Denies any auditory hallucinations.  The patient is here on petition.  Papers state the patient believes she is the singer [name] or [name].  She talks about seeing zombies or ghosts.  She states she has come back from the dead twice.  She is refusing to take her psychiatric medications because she thinks they will harm her.  The patient is not able to make rational decisions.  PAST PSYCHIATRIC HISTORY:  The patient has been here before.  Last visit approximately 6-7 years ago.  Is followed by the [facility].  SOCIAL HISTORY:  The patient lives alone.  She resides in [place]. She is unemployed.  FAMILY HISTORY:  None.  ALCOHOL AND DRUG HISTORY:  The patient denies any alcohol or substance use.  PRIMARY CARE PROVIDER:  None.  MEDICAL PROBLEMS:  The patient considers himself healthy.  MEDICATIONS:  Last listed is Depakote and Risperdal.  The patient has been noncompliant.  DRUG ALLERGIES:  No known allergies.  PHYSICAL EXAMINATION:  This is a well-nourished, well-developed  female. She appears in no distress.  She offers no complaints.  The patient again was seen in the emergency room.  She was noted to be agitated, argumentative and delusional and received Geodon 10 mg IM for psychotic symptoms.  Her speech is clear, normal pace and tone.  The patient is mood is neutral.  She is pleasant and laughs readily herself.  Not hostile or argumentative.  Thought processes again delusional thinking. She is aware of herself and situation.  LABORATORY DATA:  Laboratory data shows a urine drug screen positive for cannabis.  Alcohol level less than 11.  Urine pregnancy test is negative.  CBC is within normal limits.  Glucose of 164.  MENTAL STATUS EXAM:  The patient is fully alert.  She was seen in the interview along with Dr. Rogers [name].  She is cooperative with fair eye contact.  She is currently dressed in hospital scrubs.  She has a pleasant.  She rarely talks about the ghosts that she lives with and God that is with [place] all.  She believes Dr. Rogers [name] is a rapper.  ADMISSION DIAGNOSES:  AXIS I:  Psychosis NOS, rule out schizoaffective disorder. AXIS II:  Deferred.  No known medical conditions. AXIS V:  Psychosocial problems related to burden of illness. AXIS V:  Current is 25-30 legal  PLAN:  Our plan is to initiate Risperdal.  Will continue to monitor her mood and affect, identify her support system, reinforce med compliance. Her tentative length of stay 3-5 days.     [name], N.P.   ______________________________ Eulogio Ditch, MD    JO/MEDQ  D:  07/20/2010  T:  07/20/2010  Job:  [id number]  Electronically Signed by Limmie PatriciaP. on 07/21/2010 09:05:30 AM Electronically Signed by Eulogio Ditch  on 07/25/2010 12:21:00 PM

## 2010-08-04 NOTE — Discharge Summary (Signed)
[name], [name]              ACCOUNT NO.:  [id number]  MEDICAL RECORD NO.:  [id number]           PATIENT TYPE:  I  LOCATION:  0403                          FACILITY:  [facility]  PHYSICIAN:  Eulogio Ditch, MD DATE OF BIRTH:  07/10/1975  DATE OF ADMISSION:  07/19/2010 DATE OF DISCHARGE:  07/27/2010                              DISCHARGE SUMMARY   IDENTIFYING INFORMATION:  This is a 35 year old African American female. This was an involuntary admission.  HISTORY OF PRESENT ILLNESS:  This one of several [facility] admissions for [name] who presented our emergency room with paranoid thinking, anger, accusing people of stealing her money and expressing some delusional thoughts with grandiosity, believing that she is a famous rapper .  She is followed as an outpatient by the [facility]  ACTT team.  She apparently not been taking her medications and had been hoarding them at home. Said she did not like the way they made her feel, they were making her feel too sedated and attempting to control her mind.  MEDICAL EVALUATION AND DIAGNOSTIC STUDIES:  This is a well-nourished, well developed African American female, mildly overweight, who presented in no acute physical distress, was noted to be agitated, argumentative and delusional in the emergency room where she received 10 mg of Geodon IM.  Diagnostic studies showed a normal CBC and chemistry.  Urine pregnancy test negative.  Urine drug screen positive for cannabis and alcohol screen was less than 11.  COURSE OF HOSPITALIZATION:  She was admitted to our acute stabilization unit and given a provisional diagnosis of psychosis NOS, rule out schizoaffective disorder and initially started on Risperdal 4 mg p.o. at bedtime and Benadryl 50 mg p.o. at bedtime.  We contacted her ACTT team to verify her home medications which were Depakote ER 1000 mg p.o. at bedtime and Zyprexa 30 mg p.o. bedtime.  She initially presented quite irritable with pressured  speech and floridly psychotic, believing that she was the incarnation of a rapper and complained of having olfactory hallucinations at home and that ghosts were in having her apartment. She gave [place] permission to contact her mother who reported that the patient frequently liked to stay manic because it made her feel alive and stimulated.  Mother also reported that she was indeed hoarding her medication packs rather than taking them.  She reported Tanishi had been employed previously but had been fired from her job because of her olfactory hallucinations.  Emilija was very cautious about taking medications and did not like sedation feel, she had had on many previous medications.  We continued to work with the Risperdal and on Jul 22, 2010 added Klonopin 0.5 mg p.o. t.i.d..  She responded very positively, feeling much more relaxed, could tolerate challenges in group and participation in group therapy improved.  He was compliant with medications throughout her stay. Insight significantly improved by Jul 25, 2010 but she continued to complain about anxiety, still having some paranoid and delusional thoughts.  By Jul 26, 2010 was attentive and alert throughout her group therapy meeting, by Jul 27, 2010 in full contact with reality. Expressed that she was satisfied with medications,  felt that they were helping her and planned to stay on them.  She was initially resistant to returning to the ACTT team but then was cooperative and wanted to work out a schedule for their visits.  Her mother agreed to pick her up and transport her to her own apartment.  For follow-up by the [facility] team was to call her and set up a meeting schedule by 11:00 a.m. on Jul 28, 2010.  They planned on seeing her three times weekly and it was stressed that they would collect her old pill packs at each visit when they delivered the new ones.  DISCHARGE DIAGNOSIS:  Axis I:  Psychosis NOS, rule out  schizoaffective disorder. Axis II:  Deferred. Axis III:  No diagnosis. Axis IV: Deferred. Axis V: Current 58, past year not known.  DISCHARGE CONDITION:  Is stable.Marland Kitchen  DISCHARGE MEDICATIONS: 1. Clonazepam 0.5 mg at 8:00 a.m., 1:00 p.m. and 9:00 p.m. 2. Risperdal 2 mg 2 tablets at bedtime. 3. She was instructed to discontinue Zyprexa 30 mg at bedtime and stop     Depakote 1000 mg at bedtime.     Margaret A. Lorin Picket, N.P.   ______________________________ Eulogio Ditch, MD    MAS/MEDQ  D:  07/29/2010  T:  07/30/2010  Job:  [id number]  Electronically Signed by Kari Baars N.P. on 08/03/2010 01:05:46 PM Electronically Signed by Eulogio Ditch  on 08/04/2010 05:30:21 PM

## 2010-11-26 ENCOUNTER — Emergency Department (HOSPITAL_COMMUNITY)
Admission: EM | Admit: 2010-11-26 | Discharge: 2010-11-29 | Disposition: A | Discharge status: Other Acute Inpatient Hospital | Payer: Medicare Other | Attending: Emergency Medicine | Admitting: Emergency Medicine

## 2010-11-26 DIAGNOSIS — F29 Unspecified psychosis not due to a substance or known physiological condition: Secondary | ICD-10-CM | POA: Insufficient documentation

## 2010-11-26 DIAGNOSIS — F319 Bipolar disorder, unspecified: Secondary | ICD-10-CM | POA: Insufficient documentation

## 2010-11-26 LAB — COMPREHENSIVE METABOLIC PANEL
ALT: 15 U/L (ref 0–35)
Alkaline Phosphatase: 59 U/L (ref 39–117)
BUN: 9 mg/dL (ref 6–23)
CO2: 23 mEq/L (ref 19–32)
Calcium: 9.5 mg/dL (ref 8.4–10.5)
GFR calc Af Amer: >60 mL/min (ref >60)
GFR calc non Af Amer: 59 mL/min — ABNORMAL LOW (ref >60)
Glucose, Bld: 131 mg/dL — ABNORMAL HIGH (ref 70–99)
Potassium: 3.5 mEq/L (ref 3.5–5.1)
Sodium: 130 mEq/L — ABNORMAL LOW (ref 135–145)
Total Protein: 8.3 g/dL (ref 6.0–8.3)

## 2010-11-26 LAB — DIFFERENTIAL
Basophils Relative: 0 % (ref 0–1)
Lymphocytes Relative: 31 % (ref 12–46)
Lymphs Abs: 2 10*3/uL (ref 0.7–4.0)
Monocytes Absolute: 0.8 10*3/uL (ref 0.1–1.0)
Monocytes Relative: 13 % — ABNORMAL HIGH (ref 3–12)
Neutro Abs: 3.6 10*3/uL (ref 1.7–7.7)
Neutrophils Relative %: 56 % (ref 43–77)

## 2010-11-26 LAB — CBC
HCT: 36.7 % (ref 36.0–46.0)
Hemoglobin: 13 g/dL (ref 12.0–15.0)
MCH: 30.2 pg (ref 26.0–34.0)
MCHC: 35.4 g/dL (ref 30.0–36.0)
RBC: 4.31 MIL/uL (ref 3.87–5.11)

## 2010-11-26 LAB — ETHANOL: Alcohol, Ethyl (B): <11 mg/dL (ref 0–11)

## 2010-11-27 LAB — URINALYSIS, ROUTINE W REFLEX MICROSCOPIC
Bilirubin Urine: NEGATIVE
Nitrite: NEGATIVE
Protein, ur: NEGATIVE mg/dL
Urobilinogen, UA: 0.2 mg/dL (ref 0.0–1.0)

## 2010-11-27 LAB — RAPID URINE DRUG SCREEN, HOSP PERFORMED
Amphetamines: NOT DETECTED
Barbiturates: NOT DETECTED
Benzodiazepines: NOT DETECTED
Cocaine: NOT DETECTED
Tetrahydrocannabinol: NOT DETECTED

## 2010-11-29 LAB — POCT PREGNANCY, URINE: Preg Test, Ur: NEGATIVE

## 2010-12-10 LAB — DRUGS OF ABUSE SCREEN W/O ALC, ROUTINE URINE
Benzodiazepines.: NEGATIVE
Cocaine Metabolites: NEGATIVE
Opiate Screen, Urine: NEGATIVE
Phencyclidine (PCP): NEGATIVE
Propoxyphene: NEGATIVE

## 2010-12-10 LAB — URINALYSIS, ROUTINE W REFLEX MICROSCOPIC
Bilirubin Urine: NEGATIVE
Nitrite: NEGATIVE
Protein, ur: NEGATIVE
Urobilinogen, UA: 0.2
pH: 6.5

## 2010-12-16 LAB — COMPREHENSIVE METABOLIC PANEL
ALT: 16
AST: 20
Calcium: 9.5
GFR calc Af Amer: >60
Sodium: 137
Total Protein: 7.2

## 2010-12-16 LAB — CBC
MCHC: 34.8
RBC: 4.1
RDW: 14.3 — ABNORMAL HIGH

## 2010-12-16 LAB — URINALYSIS, ROUTINE W REFLEX MICROSCOPIC
Bilirubin Urine: NEGATIVE
Hgb urine dipstick: NEGATIVE
Ketones, ur: NEGATIVE
Specific Gravity, Urine: 1.016
Urobilinogen, UA: 0.2

## 2010-12-16 LAB — RAPID URINE DRUG SCREEN, HOSP PERFORMED
Amphetamines: NOT DETECTED
Barbiturates: NOT DETECTED
Benzodiazepines: NOT DETECTED
Opiates: NOT DETECTED

## 2011-05-05 ENCOUNTER — Ambulatory Visit (HOSPITAL_COMMUNITY)
Admission: AD | Admit: 2011-05-05 | Discharge: 2011-05-05 | Disposition: A | Discharge status: Home / Self Care | Payer: Medicaid Other | Attending: Psychiatry | Admitting: Psychiatry

## 2011-05-05 ENCOUNTER — Emergency Department (HOSPITAL_COMMUNITY)
Admission: EM | Admit: 2011-05-05 | Discharge: 2011-05-06 | Disposition: A | Discharge status: Other Acute Inpatient Hospital | Payer: Medicare Other | Attending: Emergency Medicine | Admitting: Emergency Medicine

## 2011-05-05 ENCOUNTER — Encounter (HOSPITAL_COMMUNITY): Payer: Self-pay | Admitting: Emergency Medicine

## 2011-05-05 DIAGNOSIS — F39 Unspecified mood [affective] disorder: Secondary | ICD-10-CM | POA: Diagnosis not present

## 2011-05-05 DIAGNOSIS — F29 Unspecified psychosis not due to a substance or known physiological condition: Secondary | ICD-10-CM | POA: Diagnosis not present

## 2011-05-05 DIAGNOSIS — F309 Manic episode, unspecified: Secondary | ICD-10-CM | POA: Diagnosis not present

## 2011-05-05 DIAGNOSIS — IMO0002 Reserved for concepts with insufficient information to code with codable children: Secondary | ICD-10-CM | POA: Insufficient documentation

## 2011-05-05 HISTORY — DX: Bipolar disorder, unspecified: F31.9

## 2011-05-05 LAB — CBC
Hemoglobin: 13.2 g/dL (ref 12.0–15.0)
MCH: 30 pg (ref 26.0–34.0)
MCV: 84.3 fL (ref 78.0–100.0)
RBC: 4.4 MIL/uL (ref 3.87–5.11)

## 2011-05-05 NOTE — ED Notes (Signed)
Pt alert, nad, c/o bi polar, manic behavior, onset this evening, pt presents per [facility], pt denies SI/HI, states told mother she was going to kill herself, resp even unlabored, skin pwd

## 2011-05-05 NOTE — ED Notes (Addendum)
Pt. And belongings searched and wanded by security. Pt. Has 1 belongings bag. Pt. Changed in new blue scrubs and red socks. Pt. Has shoes, pants, hat, jacket, sports bra, hair band and shirt. Pt. Belongings locked up in [facility] locker # 28.

## 2011-05-06 DIAGNOSIS — F6381 Intermittent explosive disorder: Secondary | ICD-10-CM | POA: Diagnosis present

## 2011-05-06 DIAGNOSIS — F17200 Nicotine dependence, unspecified, uncomplicated: Secondary | ICD-10-CM | POA: Diagnosis present

## 2011-05-06 DIAGNOSIS — F3160 Bipolar disorder, current episode mixed, unspecified: Secondary | ICD-10-CM | POA: Diagnosis present

## 2011-05-06 DIAGNOSIS — R45851 Suicidal ideations: Secondary | ICD-10-CM | POA: Diagnosis not present

## 2011-05-06 DIAGNOSIS — F200 Paranoid schizophrenia: Secondary | ICD-10-CM | POA: Diagnosis not present

## 2011-05-06 DIAGNOSIS — R45850 Homicidal ideations: Secondary | ICD-10-CM | POA: Diagnosis not present

## 2011-05-06 DIAGNOSIS — L708 Other acne: Secondary | ICD-10-CM | POA: Diagnosis present

## 2011-05-06 DIAGNOSIS — F3110 Bipolar disorder, current episode manic without psychotic features, unspecified: Secondary | ICD-10-CM | POA: Diagnosis not present

## 2011-05-06 LAB — URINALYSIS, ROUTINE W REFLEX MICROSCOPIC
Hgb urine dipstick: NEGATIVE
Nitrite: NEGATIVE
Specific Gravity, Urine: 1.03 (ref 1.005–1.030)
Urobilinogen, UA: 1 mg/dL (ref 0.0–1.0)
pH: 5.5 (ref 5.0–8.0)

## 2011-05-06 LAB — PREGNANCY, URINE: Preg Test, Ur: NEGATIVE

## 2011-05-06 LAB — ETHANOL: Alcohol, Ethyl (B): <11 mg/dL (ref 0–11)

## 2011-05-06 LAB — RAPID URINE DRUG SCREEN, HOSP PERFORMED
Barbiturates: NOT DETECTED
Cocaine: NOT DETECTED

## 2011-05-06 LAB — COMPREHENSIVE METABOLIC PANEL
ALT: 18 U/L (ref 0–35)
CO2: 24 mEq/L (ref 19–32)
Calcium: 9.5 mg/dL (ref 8.4–10.5)
Creatinine, Ser: 0.94 mg/dL (ref 0.50–1.10)
GFR calc Af Amer: >90 mL/min (ref >90)
GFR calc non Af Amer: 79 mL/min — ABNORMAL LOW (ref >90)
Glucose, Bld: 164 mg/dL — ABNORMAL HIGH (ref 70–99)

## 2011-05-06 MED ORDER — ONDANSETRON HCL 4 MG PO TABS: 4.0000 mg | ORAL_TABLET | Freq: Three times a day (TID) | ORAL | Status: DC | PRN

## 2011-05-06 MED ORDER — ZIPRASIDONE MESYLATE 20 MG IM SOLR
20.0000 mg | Freq: Two times a day (BID) | INTRAMUSCULAR | Status: DC | PRN
  Administered 2011-05-06: 20 mg via INTRAMUSCULAR
  Filled 2011-05-06: qty 20

## 2011-05-06 MED ORDER — ACETAMINOPHEN 325 MG PO TABS: 650.0000 mg | ORAL_TABLET | ORAL | Status: DC | PRN

## 2011-05-06 MED ORDER — LORAZEPAM 1 MG PO TABS
1.0000 mg | ORAL_TABLET | Freq: Three times a day (TID) | ORAL | Status: DC | PRN
  Administered 2011-05-06: 1 mg via ORAL
  Filled 2011-05-06: qty 1

## 2011-05-06 MED ORDER — IBUPROFEN 600 MG PO TABS: 600.0000 mg | ORAL_TABLET | Freq: Three times a day (TID) | ORAL | Status: DC | PRN

## 2011-05-06 MED ORDER — ALUM & MAG HYDROXIDE-SIMETH 200-200-20 MG/5ML PO SUSP: 30.0000 mL | ORAL | Status: DC | PRN

## 2011-05-06 NOTE — ED Notes (Signed)
IVC papers faxed to magistrate  

## 2011-05-06 NOTE — ED Notes (Signed)
Pt. Came to RN window, walked out of RN station to assist pt. Asked pt. What I can do to help her, pt. Approached and pushed my left shoulder then took a step back and laughed.  Informed pt. That behavior will not be tolerated and asked pt. To return to her room,  Pt. Went back to room.

## 2011-05-06 NOTE — ED Notes (Signed)
TC from [place] from [place]. Pt accepted to [facility] by Dr. Forrestine Him. RN Report [phone]. Updated RN and EDP. Pt is IVC and will be transported via [facility].

## 2011-05-06 NOTE — ED Notes (Signed)
Info sent to [facility] for review for possible IPT admission.

## 2011-05-06 NOTE — BH Assessment (Signed)
Assessment Note   [name] is an 36 y.o. female who presented  Here accompanied by her mother and brother. Pt reports "I am crazy". Was here 4-5 moths ago for symptoms of bipolar. Pt is paranoid, agitated and restless. Reports that she has been off her meds for about 4 months "because they were not working". Family has hx of bipolar. Pt admit using marijuana on regular basis and also smokes weed. Last intake was 4 days ago. pt also drinks alcohol, 1 bottle of liquor twice a week.  Pt  reports that is depressed and suicidal with no plan. She is also homicidal toward mother. Pt's mother reports that pt has been exhibiting bizarre behavior, irritable and talking too much. Pt was sent to [facility] per [facility] instructions as there is no available bed on [address] hall.  Axis I: Chronic Paranoid Schizophrenia Axis II: Deferred Axis III:  Past Medical History  Diagnosis Date  . Bipolar affective    Axis IV: housing problems, occupational problems, other psychosocial or environmental problems, problems related to social environment and problems with primary support group Axis V: 21-30 behavior considerably influenced by delusions or hallucinations OR serious impairment in judgment, communication OR inability to function in almost all areas  Past Medical History:  Past Medical History  Diagnosis Date  . Bipolar affective     History reviewed. No pertinent past surgical history.  Family History: No family history on file.  Social History:  reports that she has been smoking Cigarettes.  She has been smoking about 1 pack per day. She does not have any smokeless tobacco history on file. She reports that she uses illicit drugs (Marijuana). She reports that she does not drink alcohol.  Additional Social History:    Allergies: No Known Allergies  Home Medications:  Medications Prior to Admission  Medication Dose Route Frequency Provider Last Rate Last Dose  . acetaminophen (TYLENOL) tablet 650 mg  650 mg  Oral Q4H PRN April K Palumbo-Rasch, MD      . alum & mag hydroxide-simeth (MAALOX/MYLANTA) 200-200-20 MG/5ML suspension 30 mL  30 mL Oral PRN April K Palumbo-Rasch, MD      . ibuprofen (ADVIL,MOTRIN) tablet 600 mg  600 mg Oral Q8H PRN April K Palumbo-Rasch, MD      . LORazepam (ATIVAN) tablet 1 mg  1 mg Oral Q8H PRN April K Palumbo-Rasch, MD      . ondansetron [facility]) tablet 4 mg  4 mg Oral Q8H PRN April K Palumbo-Rasch, MD       No current outpatient prescriptions on file as of 05/05/2011.    OB/GYN Status:  No LMP recorded.  General Assessment Data Location of Assessment: [facility] Services Living Arrangements: Alone Can pt return to current living arrangement?: Yes Admission Status: Voluntary Is patient capable of signing voluntary admission?: Yes Transfer from: Home Referral Source: Self/Family/Friend  Education Status Is patient currently in school?: No Current Grade: na Highest grade of school patient has completed: some college Contact person: [name] (brother: [phone])  Risk to self Suicidal Ideation: Yes-Currently Present Suicidal Intent: No Is patient at risk for suicide?: No Suicidal Plan?: No Access to Means: No What has been your use of drugs/alcohol within the last 12 months?: active (uses marijuane. Last was taken 4 days ago) Previous Attempts/Gestures: No How many times?: 0  Other Self Harm Risks: 0 Triggers for Past Attempts: Unknown Intentional Self Injurious Behavior: None Family Suicide History: No Recent stressful life event(s): Conflict (Comment);Other (Comment) (Stopped taking  meds) Persecutory voices/beliefs?: No Depression: Yes Depression Symptoms: Isolating;Fatigue;Loss of interest in usual pleasures Substance abuse history and/or treatment for substance abuse?: No Suicide prevention information given to non-admitted patients: Not applicable  Risk to Others Homicidal Ideation: Yes-Currently Present (to mother "because the bitch  is crazy') Thoughts of Harm to Others: No Current Homicidal Intent: No Current Homicidal Plan: No Access to Homicidal Means: No Identified Victim: MOTHER History of harm to others?: No Assessment of Violence: None Noted Violent Behavior Description: NA Does patient have access to weapons?: No Criminal Charges Pending?: No Does patient have a court date: No  Psychosis Hallucinations: Auditory;Visual Delusions: None noted  Mental Status Report Appear/Hygiene: Meticulous Eye Contact: Poor Motor Activity: Restlessness Speech: Rapid Level of Consciousness: Alert Mood: Depressed Affect: Depressed;Irritable;Sad;Silly Anxiety Level: Moderate Thought Processes: Coherent;Relevant Judgement: Impaired Orientation: Person;Place;Time;Situation Obsessive Compulsive Thoughts/Behaviors: Moderate  Cognitive Functioning Concentration: Decreased Memory: Recent Intact IQ: Average Insight: Poor Impulse Control: Good Appetite: Fair Weight Loss: 0  Weight Gain: 0  Sleep: No Change Total Hours of Sleep: 4  Vegetative Symptoms: Staying in bed  Prior Inpatient Therapy Prior Inpatient Therapy: Yes Prior Therapy Dates: 4 MOTHS AGO Prior [facility] Facilty[facility]Provider(s): [facility] Reason for Treatment: [facility]  Prior Outpatient Therapy Prior Outpatient Therapy: No Prior Therapy Dates: na            Values / Beliefs Cultural Requests During Hospitalization: None Spiritual Requests During Hospitalization: None        Additional Information 1:1 In Past 12 Months?: No CIRT Risk: No Elopement Risk: No Does patient have medical clearance?: No     Disposition:  Disposition Disposition of Patient: Referred to (referred to [facility]) Patient referred to: Other (Comment)  On Site Evaluation by:   Reviewed with Physician:     Olin Pia 05/06/2011 2:17 AM

## 2011-05-06 NOTE — ED Notes (Signed)
Pt becoming increasingly agitated.  Yelling at staff, pushing security.  Geodon given per order.  Pt redirected back to room.

## 2011-05-06 NOTE — ED Provider Notes (Signed)
History     CSN: [id number]  Arrival date & time 05/05/11  2242   First MD Initiated Contact with Patient 05/05/11 2330      Chief Complaint  Patient presents with  . Manic Behavior  . Medical Clearance    (Consider location/radiation/quality/duration/timing/severity/associated sxs/prior treatment) Patient is a 36 y.o. female presenting with mental health disorder. The history is provided by the patient. No language interpreter was used.  Mental Health Problem The primary symptoms include disorganized speech. The primary symptoms do not include hallucinations. The current episode started today. This is a recurrent problem.  Onset: unknown. Progression: unknown. Her speech exhibits tangentiality, illogicality, circumstantiality and pressured speech.  The onset of the illness is precipitated by emotional stress. The degree of incapacity that she is experiencing as a consequence of her illness is severe. Sequelae of the illness include harmed interpersonal relations. Additional symptoms of the illness include agitation, euphoric mood, flight of ideas and distractible. Additional symptoms of the illness do not include no anhedonia, no insomnia, no hypersomnia, no headaches, no abdominal pain or no seizures. She admits to suicidal ideas. She does not have a plan to commit suicide. She contemplates harming herself. She has not already injured self. She contemplates injuring another person. She has not already  injured another person. Risk factors that are present for mental illness include a history of mental illness.    Past Medical History  Diagnosis Date  . Bipolar affective     History reviewed. No pertinent past surgical history.  No family history on file.  History  Substance Use Topics  . Smoking status: Current Everyday Smoker -- 1.0 packs/day    Types: Cigarettes  . Smokeless tobacco: Not on file  . Alcohol Use: No    OB History    Grav Para Term Preterm Abortions TAB SAB  Ect Mult Living                  Review of Systems  Constitutional: Negative.   HENT: Negative.   Eyes: Negative.   Respiratory: Negative.   Cardiovascular: Negative.   Gastrointestinal: Negative.  Negative for abdominal pain.  Genitourinary: Negative.   Musculoskeletal: Negative.   Skin: Negative.   Neurological: Negative for seizures and headaches.  Hematological: Negative.   Psychiatric/Behavioral: Positive for agitation. Negative for hallucinations. The patient does not have insomnia.     Allergies  Review of patient's allergies indicates no known allergies.  Home Medications  No current outpatient prescriptions on file.  BP 158/90  Pulse 89  Temp(Src) 98 F (36.7 C) (Oral)  Resp 16  Wt 190 lb (86.183 kg)  SpO2 96%  Physical Exam  Constitutional: She is oriented to person, place, and time. She appears well-developed and well-nourished. No distress.  HENT:  Head: Normocephalic and atraumatic.  Mouth/Throat: Oropharynx is clear and moist. No oropharyngeal exudate.  Eyes: Conjunctivae are normal. Pupils are equal, round, and reactive to light.  Neck: Normal range of motion. Neck supple.  Cardiovascular: Normal rate and regular rhythm.   Pulmonary/Chest: Effort normal and breath sounds normal. She has no wheezes. She has no rales.  Abdominal: Soft. Bowel sounds are normal. There is no tenderness. There is no rebound and no guarding.  Musculoskeletal: Normal range of motion. She exhibits no edema.  Neurological: She is alert and oriented to person, place, and time. She has normal reflexes.  Skin: Skin is warm and dry.  Psychiatric: Judgment and thought content normal. Her affect is angry. Her speech is  rapid and/or pressured. She is agitated. Cognition and memory are normal.    ED Course  Procedures (including critical care time)  Labs Reviewed  CBC - Abnormal; Notable for the following:    WBC 10.6 (*)    All other components within normal limits    COMPREHENSIVE METABOLIC PANEL  ETHANOL  URINE RAPID DRUG SCREEN (HOSP PERFORMED)  PREGNANCY, URINE  URINALYSIS, ROUTINE W REFLEX MICROSCOPIC   No results found.   No diagnosis found.    MDM  telepsych        Jerae Izard K Wahid Holley-Rasch, MD 05/06/11 6045

## 2011-05-06 NOTE — ED Notes (Signed)
Redirected back to room

## 2011-08-19 DIAGNOSIS — Z124 Encounter for screening for malignant neoplasm of cervix: Secondary | ICD-10-CM | POA: Diagnosis not present

## 2011-08-19 DIAGNOSIS — Z0000 Encounter for general adult medical examination without abnormal findings: Secondary | ICD-10-CM | POA: Diagnosis not present

## 2011-08-19 DIAGNOSIS — N760 Acute vaginitis: Secondary | ICD-10-CM | POA: Diagnosis not present

## 2011-08-19 DIAGNOSIS — Z01419 Encounter for gynecological examination (general) (routine) without abnormal findings: Secondary | ICD-10-CM | POA: Diagnosis not present

## 2011-08-19 DIAGNOSIS — N9489 Other specified conditions associated with female genital organs and menstrual cycle: Secondary | ICD-10-CM | POA: Diagnosis not present

## 2012-03-25 DIAGNOSIS — F411 Generalized anxiety disorder: Secondary | ICD-10-CM | POA: Diagnosis present

## 2012-03-25 DIAGNOSIS — F429 Obsessive-compulsive disorder, unspecified: Secondary | ICD-10-CM | POA: Diagnosis present

## 2012-03-25 DIAGNOSIS — F259 Schizoaffective disorder, unspecified: Principal | ICD-10-CM | POA: Diagnosis present

## 2012-03-25 DIAGNOSIS — F1220 Cannabis dependence, uncomplicated: Secondary | ICD-10-CM | POA: Diagnosis present

## 2012-03-25 DIAGNOSIS — Z79899 Other long term (current) drug therapy: Secondary | ICD-10-CM

## 2012-06-07 DIAGNOSIS — Z79899 Other long term (current) drug therapy: Secondary | ICD-10-CM | POA: Diagnosis not present

## 2012-06-07 DIAGNOSIS — F3110 Bipolar disorder, current episode manic without psychotic features, unspecified: Secondary | ICD-10-CM | POA: Diagnosis not present

## 2012-08-14 DIAGNOSIS — F3110 Bipolar disorder, current episode manic without psychotic features, unspecified: Secondary | ICD-10-CM | POA: Diagnosis not present

## 2012-10-01 ENCOUNTER — Encounter (HOSPITAL_COMMUNITY): Payer: Self-pay | Admitting: Emergency Medicine

## 2012-10-01 ENCOUNTER — Emergency Department (EMERGENCY_DEPARTMENT_HOSPITAL)
Admission: EM | Admit: 2012-10-01 | Discharge: 2012-10-02 | Disposition: A | Discharge status: Other Acute Inpatient Hospital | Payer: Medicare Other | Source: Home / Self Care | Attending: Emergency Medicine | Admitting: Emergency Medicine

## 2012-10-01 DIAGNOSIS — F29 Unspecified psychosis not due to a substance or known physiological condition: Secondary | ICD-10-CM

## 2012-10-01 DIAGNOSIS — Z3202 Encounter for pregnancy test, result negative: Secondary | ICD-10-CM | POA: Insufficient documentation

## 2012-10-01 DIAGNOSIS — F429 Obsessive-compulsive disorder, unspecified: Secondary | ICD-10-CM | POA: Insufficient documentation

## 2012-10-01 DIAGNOSIS — Z8659 Personal history of other mental and behavioral disorders: Secondary | ICD-10-CM | POA: Insufficient documentation

## 2012-10-01 DIAGNOSIS — F319 Bipolar disorder, unspecified: Secondary | ICD-10-CM | POA: Insufficient documentation

## 2012-10-01 DIAGNOSIS — F17200 Nicotine dependence, unspecified, uncomplicated: Secondary | ICD-10-CM | POA: Insufficient documentation

## 2012-10-01 DIAGNOSIS — F3112 Bipolar disorder, current episode manic without psychotic features, moderate: Secondary | ICD-10-CM

## 2012-10-01 DIAGNOSIS — Z79899 Other long term (current) drug therapy: Secondary | ICD-10-CM | POA: Insufficient documentation

## 2012-10-01 HISTORY — DX: Anxiety disorder, unspecified: F41.9

## 2012-10-01 HISTORY — DX: Obsessive-compulsive disorder, unspecified: F42.9

## 2012-10-01 LAB — CBC WITH DIFFERENTIAL/PLATELET
Hemoglobin: 13.3 g/dL (ref 12.0–15.0)
Lymphocytes Relative: 27 % (ref 12–46)
Lymphs Abs: 3.3 10*3/uL (ref 0.7–4.0)
Neutrophils Relative %: 64 % (ref 43–77)
Platelets: 361 10*3/uL (ref 150–400)
RBC: 4.49 MIL/uL (ref 3.87–5.11)
WBC: 12.3 10*3/uL — ABNORMAL HIGH (ref 4.0–10.5)

## 2012-10-01 LAB — COMPREHENSIVE METABOLIC PANEL
ALT: 25 U/L (ref 0–35)
Alkaline Phosphatase: 56 U/L (ref 39–117)
CO2: 20 mEq/L (ref 19–32)
Chloride: 105 mEq/L (ref 96–112)
GFR calc Af Amer: 51 mL/min — ABNORMAL LOW (ref >90)
GFR calc non Af Amer: 44 mL/min — ABNORMAL LOW (ref >90)
Glucose, Bld: 136 mg/dL — ABNORMAL HIGH (ref 70–99)
Potassium: 3.5 mEq/L (ref 3.5–5.1)
Sodium: 139 mEq/L (ref 135–145)
Total Bilirubin: 0.6 mg/dL (ref 0.3–1.2)
Total Protein: 8.9 g/dL — ABNORMAL HIGH (ref 6.0–8.3)

## 2012-10-01 LAB — ETHANOL: Alcohol, Ethyl (B): <11 mg/dL (ref 0–11)

## 2012-10-01 LAB — URINALYSIS, ROUTINE W REFLEX MICROSCOPIC
Ketones, ur: NEGATIVE mg/dL
Leukocytes, UA: NEGATIVE
Nitrite: NEGATIVE
pH: 5 (ref 5.0–8.0)

## 2012-10-01 LAB — RAPID URINE DRUG SCREEN, HOSP PERFORMED: Amphetamines: NOT DETECTED

## 2012-10-01 LAB — URINE MICROSCOPIC-ADD ON

## 2012-10-01 MED ORDER — ALUM & MAG HYDROXIDE-SIMETH 200-200-20 MG/5ML PO SUSP: 30.0000 mL | ORAL | Status: DC | PRN

## 2012-10-01 MED ORDER — ZIPRASIDONE MESYLATE 20 MG IM SOLR
20.0000 mg | Freq: Once | INTRAMUSCULAR | Status: AC
  Administered 2012-10-01: 20 mg via INTRAMUSCULAR
  Filled 2012-10-01: qty 20

## 2012-10-01 MED ORDER — DIPHENHYDRAMINE HCL 50 MG/ML IJ SOLN
50.0000 mg | Freq: Once | INTRAMUSCULAR | Status: AC
  Administered 2012-10-01: 50 mg via INTRAMUSCULAR
  Filled 2012-10-01: qty 1

## 2012-10-01 MED ORDER — IBUPROFEN 200 MG PO TABS: 400.0000 mg | ORAL_TABLET | Freq: Three times a day (TID) | ORAL | Status: DC | PRN

## 2012-10-01 MED ORDER — LORAZEPAM 1 MG PO TABS
1.0000 mg | ORAL_TABLET | Freq: Three times a day (TID) | ORAL | Status: DC | PRN
  Administered 2012-10-02: 1 mg via ORAL
  Filled 2012-10-01: qty 1

## 2012-10-01 MED ORDER — ACETAMINOPHEN 325 MG PO TABS: 650.0000 mg | ORAL_TABLET | ORAL | Status: DC | PRN

## 2012-10-01 MED ORDER — NICOTINE 21 MG/24HR TD PT24: 21.0000 mg | MEDICATED_PATCH | Freq: Every day | TRANSDERMAL | Status: DC | PRN

## 2012-10-01 MED ORDER — ONDANSETRON HCL 4 MG PO TABS: 4.0000 mg | ORAL_TABLET | Freq: Three times a day (TID) | ORAL | Status: DC | PRN

## 2012-10-01 MED ORDER — LORAZEPAM 2 MG/ML IJ SOLN
2.0000 mg | Freq: Once | INTRAMUSCULAR | Status: AC
  Administered 2012-10-01: 2 mg via INTRAMUSCULAR
  Filled 2012-10-01: qty 1

## 2012-10-01 NOTE — ED Notes (Signed)
Urine sent to lab and verified by Jacqulynn Cadet.

## 2012-10-01 NOTE — ED Notes (Signed)
Pt has been changed into blue scurbs and  wanded by security.  Pt has shirt, pants and shoes.  Put into locker 28

## 2012-10-01 NOTE — ED Notes (Signed)
Pt states she had an altercation with her mother tonight and her mother brought her in for evaluation  Pt states she has been placed in inpt behavioral health several times in the past   Hx of bipolar, OCD, and anxiety  Pt has inappropriate behavior in triage, laughing in room by herself, talking to self, not following directions, etc

## 2012-10-01 NOTE — ED Notes (Signed)
Pt presents for eval after altercation with mother,  Pt agitated, uncooperative, irritable, unredirectable.  As per triage, pt SI & HI. Previous admissions to [facility] in past, Hx of Bipolar, OCD and Anxiety.

## 2012-10-02 ENCOUNTER — Inpatient Hospital Stay (HOSPITAL_COMMUNITY)
Admission: AD | Admit: 2012-10-02 | Discharge: 2012-10-08 | DRG: 885 | Disposition: A | Discharge status: Home / Self Care | Payer: Medicare Other | Source: Intra-hospital | Attending: Psychiatry | Admitting: Psychiatry

## 2012-10-02 ENCOUNTER — Encounter (HOSPITAL_COMMUNITY): Payer: Self-pay | Admitting: *Deleted

## 2012-10-02 DIAGNOSIS — Z79899 Other long term (current) drug therapy: Secondary | ICD-10-CM

## 2012-10-02 DIAGNOSIS — F259 Schizoaffective disorder, unspecified: Principal | ICD-10-CM | POA: Diagnosis present

## 2012-10-02 DIAGNOSIS — F29 Unspecified psychosis not due to a substance or known physiological condition: Secondary | ICD-10-CM | POA: Diagnosis not present

## 2012-10-02 DIAGNOSIS — F1210 Cannabis abuse, uncomplicated: Secondary | ICD-10-CM | POA: Diagnosis present

## 2012-10-02 MED ORDER — MAGNESIUM HYDROXIDE 400 MG/5ML PO SUSP: 30.0000 mL | Freq: Every day | ORAL | Status: DC | PRN

## 2012-10-02 MED ORDER — ALUM & MAG HYDROXIDE-SIMETH 200-200-20 MG/5ML PO SUSP: 30.0000 mL | ORAL | Status: DC | PRN

## 2012-10-02 MED ORDER — ACETAMINOPHEN 325 MG PO TABS: 650.0000 mg | ORAL_TABLET | Freq: Four times a day (QID) | ORAL | Status: DC | PRN

## 2012-10-02 MED ORDER — TRAZODONE HCL 50 MG PO TABS
50.0000 mg | ORAL_TABLET | Freq: Every evening | ORAL | Status: DC | PRN
  Administered 2012-10-02 – 2012-10-07 (×5): 50 mg via ORAL
  Filled 2012-10-02 (×16): qty 1

## 2012-10-02 NOTE — [facility] Group Notes (Signed)
Adult Psychoeducational Group Note  Date:  10/02/2012 Time:  8:44 PM  Group Topic/Focus:  Wrap-Up Group:   The focus of this group is to help patients review their daily goal of treatment and discuss progress on daily workbooks.  Participation Level:  Minimal  Participation Quality:  Redirectable  Affect:  Flat  Cognitive:  Delusional  Insight: Lacking  Engagement in Group:  Distracting  Modes of Intervention:  Discussion  Additional Comments:  [name] stated that her name was [employer].  She went on to say that she didn't know why she was here and that she had an argument with her mother.  [name] had to be redirected numerous times for talking while others were talking and going off topic.  She expressed that she wanted to work on being patient.  Barbara, [name] 10/02/2012, 8:44 PM

## 2012-10-02 NOTE — ED Notes (Signed)
Talking and approaching the NS repeatedly. Not easily directed back to room. Agitated, hyperverbal.

## 2012-10-02 NOTE — Progress Notes (Signed)
[name], female   DOB: 08-03-1975, 37 y.o.   MRN: [id number] Patient admitted to [facility] voluntarily from ED.  Patient got into an altercation with her mother over her being noncompliant with her medications.  Patient has a hx of prior HI toward her mother from a pervious psyche assessment in March of this year.  She denies any SI/HI/AVH.  She lives in an apartment with a roommate.  She has a long hx of schizoaffective d/o.  Patient was also extremely agitated and sedated in the ED.  Her main complaint now is depression.  Patient was oriented to unit and room.

## 2012-10-02 NOTE — Tx Team (Signed)
Initial Interdisciplinary Treatment Plan  PATIENT STRENGTHS: (choose at least two) Average or above average intelligence Capable of independent living Communication skills Physical Health Supportive family/friends  PATIENT STRESSORS: Marital or family conflict Medication change or noncompliance   PROBLEM LIST: Problem List/Patient Goals Date to be addressed Date deferred Reason deferred Estimated date of resolution  Depression 10/02/12     Medication Noncompliance 10/02/12     Family Conflict 10/02/12                                          DISCHARGE CRITERIA:  Improved stabilization in mood, thinking, and/or behavior Motivation to continue treatment in a less acute level of care Verbal commitment to aftercare and medication compliance  PRELIMINARY DISCHARGE PLAN: Return to previous living arrangement Return to previous work or school arrangements  PATIENT/FAMIILY INVOLVEMENT: This treatment plan has been presented to and reviewed with the patient, [name].  The patient and family have been given the opportunity to ask questions and make suggestions.  [place] Mon 10/02/2012, 5:38 PM

## 2012-10-02 NOTE — ED Provider Notes (Signed)
Act team indicates pt accepted to [facility], bed ready, Dr Jannifer Franklin. On review, cr sl elevated, will hold any nsaids, po fluids, order repeat for tomorrow am.  If increases, admitting psych team may get triad hospitalist/med consult.    Suzi Roots, MD 10/02/12 [phone]

## 2012-10-02 NOTE — ED Notes (Signed)
Report given to Blane Ohara at [facility], security to transport.
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
## 2012-10-02 NOTE — BHH Counselor (Signed)
Pt accepted by Donell Sievert, PA, pending 400 hall bed.  Patient also referred to [facility]; pending review.
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
## 2012-10-02 NOTE — BHH Counselor (Signed)
Pt accepted to [facility] by Donell Sievert, PA to Dr. Jannifer Franklin. The room assignment is 403-2. Nursing report # is [phone].

## 2012-10-03 DIAGNOSIS — F3130 Bipolar disorder, current episode depressed, mild or moderate severity, unspecified: Secondary | ICD-10-CM | POA: Diagnosis not present

## 2012-10-03 DIAGNOSIS — F259 Schizoaffective disorder, unspecified: Principal | ICD-10-CM | POA: Diagnosis present

## 2012-10-03 MED ORDER — ARIPIPRAZOLE 10 MG PO TABS
10.0000 mg | ORAL_TABLET | Freq: Every day | ORAL | Status: DC
  Administered 2012-10-03 – 2012-10-08 (×6): 10 mg via ORAL
  Filled 2012-10-03 (×10): qty 1

## 2012-10-03 MED ORDER — TRIHEXYPHENIDYL HCL 2 MG PO TABS
2.0000 mg | ORAL_TABLET | Freq: Every day | ORAL | Status: DC
  Administered 2012-10-03 – 2012-10-08 (×6): 2 mg via ORAL
  Filled 2012-10-03 (×10): qty 1
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
## 2012-10-03 NOTE — BHH Group Notes (Signed)
[facility] LCSW Aftercare Discharge Planning Group Note   10/03/2012 8:03 AM  Participation Quality:  Engaged  Mood/Affect:  Blunted  Depression Rating:  Denies  Anxiety Rating:  denies  Thoughts of Suicide:  No Will you contract for safety?   NA  Current AVH:  Denies, but then states there are "evil, dark spirits" in her apartment  Plan for Discharge/Comments:  States it was her "f___ing mother's idea" for her to come in.  "My mom said I was loud and she was scared.  She also threatened to take away my disability check.    Who does that?"  States she is outpt at [facility] and also says she is compliant with meds.  Admits to smoking cannabis regularly with female roommate.  "It's platonic."  Transportation Means: family  Supports:  family  [name], [name]

## 2012-10-03 NOTE — Progress Notes (Signed)
Recreation Therapy Notes  Date: 07.30.2014 Time: 9:30am Location: 400 Hall Dayroom  Group Topic: Leisure Education  Goal Area(s) Addresses:  Patient will verbalize activity of interest by end of group session. Patient will verbalize the ability to use positive leisure/recreation as a coping mechanism.  Behavioral Response: Disengaged   Intervention: Adapted Game  Activity: Leisure ABC's. LRT wrote the letter of the alphabet on the white board in the dayroom and passed a cup around the room with each letter of the alphabet on it. Patients selected a letter from the cup and stated a leisure/recreation activity to coorespond with that letter.   Education:  Discharge Planning, Pharmacologist, Leisure Education  Education Outcome: Needs additional education  Clinical Observations/Feedback: Patient attended group for approximately 5 minutes, did not participate in group activity and made no statements of interest to group.    Marykay Lex Channell Quattrone, LRT/CTRS  Bethzaida Boord L 10/03/2012 2:45 PM

## 2012-10-03 NOTE — BHH Group Notes (Signed)
Adult Psychoeducational Group Note  Date:  10/03/2012 Time:  9:59 PM  Group Topic/Focus:  Wrap-Up Group:   The focus of this group is to help patients review their daily goal of treatment and discuss progress on daily workbooks.  Participation Level:  Did Not Attend  Participation Quality:  None  Affect:  None  Cognitive:  None  Insight: None  Engagement in Group:  Did not attend  Modes of Intervention:  Discussion  Additional Comments:  [name] did not attend group.  Barbara, [name] 10/03/2012, 9:59 PM
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
## 2012-10-03 NOTE — BHH Group Notes (Signed)
[facility] Group Therapy  10/03/2012 , 1:19 PM    Type of Therapy:  [facility] Presentation  Participation Level:  Was in group initially, but called out to see NP and never returned.    Summary of Progress/Problems:  Onalee Hua from [facility] came to present his recovery story and play the guitar.    [name] 10/03/2012 , 1:19 PM

## 2012-10-03 NOTE — BHH Suicide Risk Assessment (Signed)
Suicide Risk Assessment  Admission Assessment     Nursing information obtained from:    Demographic factors:    Current Mental Status:    Loss Factors:    Historical Factors:    Risk Reduction Factors:     CLINICAL FACTORS:   Severe Anxiety and/or Agitation Bipolar Disorder: manic phase Alcohol/Substance Abuse/Dependencies Previous Psychiatric Diagnoses and Treatments  COGNITIVE FEATURES THAT CONTRIBUTE TO RISK:  Closed-mindedness Polarized thinking    SUICIDE RISK:   Minimal: No identifiable suicidal ideation.  Patients presenting with no risk factors but with morbid ruminations; may be classified as minimal risk based on the severity of the depressive symptoms  PLAN OF CARE:1. Admit for crisis management and stabilization. 2. Medication management to reduce current symptoms to base line and improve the     patient's overall level of functioning 3. Treat health problems as indicated. 4. Develop treatment plan to decrease risk of relapse upon discharge and the need for     readmission. 5. Psycho-social education regarding relapse prevention and self care. 6. Health care follow up as needed for medical problems. 7. Restart home medications where appropriate.   I certify that inpatient services furnished can reasonably be expected to improve the patient's condition.  Maddux Vanscyoc,MD 10/03/2012, 11:19 AM

## 2012-10-03 NOTE — H&P (Signed)
Psychiatric Admission Assessment Adult  Patient Identification:  [name] Date of Evaluation:  10/03/2012 Chief Complaint:  BIPOLAR DISORDER; OCD History of Present Illness: This is a voluntary admission for this 37 year old female who presented to [facility] ED after a reported altercation with her mother. Patient states "I drove over to eat with my mother after she called me up. She was irritable and said I needed to come to the hospital so I went. There wasn't anything wrong with me. I should have just went back to my apartment. I have been manic but I had it under control. I don't really think the medication affects anything. I take the Abilify about once or twice a week. I can tell when I'm manic. My apartment has an evil vibe in it. It just needs to be blessed so the devil will stop moving stuff around and slamming doors. It's really not a big deal. I'm not manic now. I really need to be out to pay my bills. But if the MD thinks I need the long acting medicine I would comply. My mother is the problem saying she needs to have control over my Disability money." The patient does not appear manic during assessment but does appear to be delusional.   Elements:  Location:  [facility] in-patient. Quality:  Mood lability, medication noncompliance. Severity:  Conflict with family, SI. Timing:  Last few weeks. Duration:  Chronic. Context:  Manic behaviors, medication noncompliance. Associated Signs/Synptoms: Depression Symptoms:  depressed mood, anhedonia, feelings of worthlessness/guilt, hopelessness, suicidal thoughts without plan, disturbed sleep, (Hypo) Manic Symptoms:  Delusions, Hallucinations, Irritable Mood, Labiality of Mood, Anxiety Symptoms:  Excessive Worry, Psychotic Symptoms:  Hallucinations: Visual PTSD Symptoms: Denies  Psychiatric Specialty Exam: Physical Exam  Constitutional: She appears well-developed and well-nourished.  -Findings from the ED reviewed.   Review of  Systems  Constitutional: Negative.   HENT: Negative.   Eyes: Negative.   Respiratory: Negative.   Cardiovascular: Negative.   Gastrointestinal: Negative.   Genitourinary: Negative.   Musculoskeletal: Negative.   Skin: Negative.   Neurological: Negative.   Endo/Heme/Allergies: Negative.   Psychiatric/Behavioral: Positive for depression, suicidal ideas, hallucinations and substance abuse. Negative for memory loss. The patient is not nervous/anxious and does not have insomnia.     Blood pressure 111/76, pulse 62, temperature 98.8 F (37.1 C), temperature source Oral, resp. rate 16, height 5\' 1"  (1.549 m), weight 87.091 kg (192 lb).Body mass index is 36.3 kg/(m^2).  General Appearance: Casual and Disheveled  Eye Contact::  Good  Speech:  Clear and Coherent and Pressured  Volume:  Normal  Mood:  Depressed and Dysphoric  Affect:  Flat  Thought Process:  Circumstantial  Orientation:  Full (Time, Place, and Person)  Thought Content:  Delusions and Paranoid Ideation  Suicidal Thoughts:  Yes.  without intent/plan  Homicidal Thoughts:  No  Memory:  Immediate;   Good Recent;   Good Remote;   Good  Judgement:  Poor  Insight:  Shallow  Psychomotor Activity:  Normal  Concentration:  Fair  Recall:  Good  Akathisia:  No  Handed:  Left  AIMS (if indicated):     Assets:  Communication Skills Desire for Improvement Housing Intimacy Leisure Time Physical Health Resilience Social Support  Sleep:  Number of Hours: 5.3    Past Psychiatric History:Yes Diagnosis: Schizoaffective  Hospitalizations:Multiple at [facility], also [facility], [facility]  Outpatient Care: [facility]  Substance Abuse Care:Denies  Self-Mutilation:Denies  Suicidal Attempts:Denies  Violent Behaviors:Denies  Past Medical History:   Past Medical History  Diagnosis Date  . Bipolar affective   . OCD (obsessive compulsive disorder)   . Anxiety    None. Allergies:  No Known Allergies PTA  Medications: Prescriptions prior to admission  Medication Sig Dispense Refill  . ARIPiprazole (ABILIFY) 10 MG tablet Take 10 mg by mouth daily.      . benztropine (COGENTIN) 1 MG tablet Take 1 mg by mouth at bedtime.        Previous Psychotropic Medications:  Medication/Dose  Depakote-"My brain gets to heavy"  Cogentin "Makes my throat dry"             Substance Abuse History in the last 12 months:  yes  Consequences of Substance Abuse: Negative  Social History:  reports that she has been smoking Cigarettes.  She has been smoking about 1.00 pack per day. She does not have any smokeless tobacco history on file. She reports that  drinks alcohol. She reports that she uses illicit drugs (Marijuana). Additional Social History:                      Current Place of Residence: [place], [place] Place of Birth:  [place], [place]  Family Members: Marital Status:  Single Children: None  Sons:  Daughters: Relationships: Education:  Corporate treasurer Problems/Performance: Religious Beliefs/Practices: History of Abuse (Emotional/Phsycial/Sexual)-Denies Teacher, music History:  None. Legal History: Denies Hobbies/Interests:  Family History:   Family History  Problem Relation Age of Onset  . Mental illness Neg Hx     Results for orders placed during the hospital encounter of 10/01/12 (from the past 72 hour(s))  URINE RAPID DRUG SCREEN (HOSP PERFORMED)     Status: Abnormal   Collection Time    10/01/12  9:52 PM      Result Value Range   Opiates NONE DETECTED  NONE DETECTED   Cocaine NONE DETECTED  NONE DETECTED   Benzodiazepines NONE DETECTED  NONE DETECTED   Amphetamines NONE DETECTED  NONE DETECTED   Tetrahydrocannabinol POSITIVE (*) NONE DETECTED   Barbiturates NONE DETECTED  NONE DETECTED   Comment:            DRUG SCREEN FOR MEDICAL PURPOSES     ONLY.  IF CONFIRMATION IS NEEDED     FOR ANY PURPOSE, NOTIFY LAB     WITHIN 5 DAYS.                 LOWEST DETECTABLE LIMITS     FOR URINE DRUG SCREEN     Drug Class       Cutoff (ng/mL)     Amphetamine      1000     Barbiturate      200     Benzodiazepine   200     Tricyclics       300     Opiates          300     Cocaine          300     THC              50  URINALYSIS, ROUTINE W REFLEX MICROSCOPIC     Status: Abnormal   Collection Time    10/01/12  9:52 PM      Result Value Range   Color, Urine AMBER (*) YELLOW   Comment: BIOCHEMICALS MAY BE AFFECTED BY COLOR   APPearance CLOUDY (*) CLEAR   Specific Gravity, Urine 1.033 (*)  1.005 - 1.030   pH 5.0  5.0 - 8.0   Glucose, UA NEGATIVE  NEGATIVE mg/dL   Hgb urine dipstick NEGATIVE  NEGATIVE   Bilirubin Urine SMALL (*) NEGATIVE   Ketones, ur NEGATIVE  NEGATIVE mg/dL   Protein, ur 30 (*) NEGATIVE mg/dL   Urobilinogen, UA 0.2  0.0 - 1.0 mg/dL   Nitrite NEGATIVE  NEGATIVE   Leukocytes, UA NEGATIVE  NEGATIVE  PREGNANCY, URINE     Status: None   Collection Time    10/01/12  9:52 PM      Result Value Range   Preg Test, Ur NEGATIVE  NEGATIVE   Comment:            THE SENSITIVITY OF THIS     METHODOLOGY IS >20 mIU/mL.  URINE MICROSCOPIC-ADD ON     Status: None   Collection Time    10/01/12  9:52 PM      Result Value Range   Squamous Epithelial / LPF RARE  RARE   Urine-Other MUCOUS PRESENT    CBC WITH DIFFERENTIAL     Status: Abnormal   Collection Time    10/01/12  9:55 PM      Result Value Range   WBC 12.3 (*) 4.0 - 10.5 K/uL   RBC 4.49  3.87 - 5.11 MIL/uL   Hemoglobin 13.3  12.0 - 15.0 g/dL   HCT 16.1  09.6 - 04.5 %   MCV 86.0  78.0 - 100.0 fL   MCH 29.6  26.0 - 34.0 pg   MCHC 34.5  30.0 - 36.0 g/dL   RDW 40.9  81.1 - 91.4 %   Platelets 361  150 - 400 K/uL   Neutrophils Relative % 64  43 - 77 %   Neutro Abs 7.8 (*) 1.7 - 7.7 K/uL   Lymphocytes Relative 27  12 - 46 %   Lymphs Abs 3.3  0.7 - 4.0 K/uL   Monocytes Relative 8  3 - 12 %   Monocytes Absolute 0.9  0.1 - 1.0 K/uL   Eosinophils Relative 1  0 - 5 %    Eosinophils Absolute 0.2  0.0 - 0.7 K/uL   Basophils Relative 0  0 - 1 %   Basophils Absolute 0.0  0.0 - 0.1 K/uL  COMPREHENSIVE METABOLIC PANEL     Status: Abnormal   Collection Time    10/01/12  9:55 PM      Result Value Range   Sodium 139  135 - 145 mEq/L   Potassium 3.5  3.5 - 5.1 mEq/L   Chloride 105  96 - 112 mEq/L   CO2 20  19 - 32 mEq/L   Glucose, Bld 136 (*) 70 - 99 mg/dL   BUN 15  6 - 23 mg/dL   Creatinine, Ser 7.82 (*) 0.50 - 1.10 mg/dL   Calcium 9.6  8.4 - 95.6 mg/dL   Total Protein 8.9 (*) 6.0 - 8.3 g/dL   Albumin 4.4  3.5 - 5.2 g/dL   AST 27  0 - 37 U/L   ALT 25  0 - 35 U/L   Alkaline Phosphatase 56  39 - 117 U/L   Total Bilirubin 0.6  0.3 - 1.2 mg/dL   GFR calc non Af Amer 44 (*) >90 mL/min   GFR calc Af Amer 51 (*) >90 mL/min   Comment:            The eGFR has been calculated     using the CKD EPI  equation.     This calculation has not been     validated in all clinical     situations.     eGFR's persistently     <90 mL/min signify     possible Chronic Kidney Disease.  ETHANOL     Status: None   Collection Time    10/01/12  9:55 PM      Result Value Range   Alcohol, Ethyl (B) <11  0 - 11 mg/dL   Comment:            LOWEST DETECTABLE LIMIT FOR     SERUM ALCOHOL IS 11 mg/dL     FOR MEDICAL PURPOSES ONLY   Psychological Evaluations:  Assessment:   AXIS I:  Bipolar, Depressed AXIS II:  Deferred AXIS III:   Past Medical History  Diagnosis Date  . Bipolar affective   . OCD (obsessive compulsive disorder)   . Anxiety    AXIS IV:  economic problems, educational problems, housing problems, occupational problems and other psychosocial or environmental problems AXIS V:  41-50 serious symptoms   Treatment Plan/Recommendations:   1. Admit for crisis management and stabilization. Estimated length of stay 5-7 days. 2. Medication management to reduce current symptoms to base line and improve the patient's level of functioning. Continue on Abilify 10 mg for  mood stability and order Artane 2 mg at hs for prevention of side effects. Due to patient's medication noncompliance will consider starting long acting injection of Abilify. Trazodone 50 mg hs prn initiated to help improve sleep. 3. Develop treatment plan to decrease risk of relapse upon discharge of depressive symptoms and the need for readmission. 5. Group therapy to facilitate development of healthy coping skills to use for depressive and manic symptoms.  6. Health care follow up as needed for medical problems.  7. Discharge plan to include therapy to help patient cope with stressor of chronic mental illness.  8. Call for Consult with Hospitalist for additional specialty patient services as needed.   Treatment Plan Summary: Daily contact with patient to assess and evaluate symptoms and progress in treatment Medication management Current Medications:  Current Facility-Administered Medications  Medication Dose Route Frequency Provider Last Rate Last Dose  . acetaminophen (TYLENOL) tablet 650 mg  650 mg Oral Q6H PRN Kerry Hough, PA-C      . alum & mag hydroxide-simeth (MAALOX/MYLANTA) 200-200-20 MG/5ML suspension 30 mL  30 mL Oral Q4H PRN Kerry Hough, PA-C      . ARIPiprazole (ABILIFY) tablet 10 mg  10 mg Oral Daily Jazlin Tapscott   10 mg at 10/03/12 1134  . magnesium hydroxide (MILK OF MAGNESIA) suspension 30 mL  30 mL Oral Daily PRN Kerry Hough, PA-C      . traZODone (DESYREL) tablet 50 mg  50 mg Oral QHS,MR X 1 Kerry Hough, PA-C   50 mg at 10/02/12 2133  . trihexyphenidyl (ARTANE) tablet 2 mg  2 mg Oral Daily Jillian Warth   2 mg at 10/03/12 1134    Observation Level/Precautions:  15 minute checks  Laboratory:  CBC Chemistry Profile UDS UA  Psychotherapy:  Group Sessions  Medications:  See list  Consultations:  As needed  Discharge Concerns:  Safety, Stability, and Medication Compliance  Estimated LOS: 5-7 days  Other:  Obtain collateral information from mother.     I certify that inpatient services furnished can reasonably be expected to improve the patient's condition.   Fransisca Kaufmann NP-C  7/30/20141:32 PM  1.  Admit for crisis management and stabilization. 2. Medication management to reduce current symptoms to base line and improve the     patient's overall level of functioning 3. Treat health problems as indicated. 4. Develop treatment plan to decrease risk of relapse upon discharge and the need for     readmission. 5. Psycho-social education regarding relapse prevention and self care. 6. Health care follow up as needed for medical problems. 7. Restart home medications where appropriate.

## 2012-10-03 NOTE — Progress Notes (Signed)
[name], female   DOB: February 07, 1976, 37 y.o.   MRN: [id number] D: Patient met with treatment team and discussed her conflict with her mother.  She stated she got upset with her mother because her mother "wants to take over my disability money."  "I'm the payee and she can't do that."  "I called her a f###in bitch."  Patient states that she is seeing evil spirits in her apartment and that "they come from the dark in the atmosphere."  She states that she has had SI, but "I love life too much to do it."  Patient cursed several times while talking to MD and was asked not to use that language.  She complied and then started again.  She appears angry with flat, blunted affect.  She was receptive to the idea of an injectable medication.  She denies have HI toward her mother.  She denies AH, however reports VH.  She has had prior multiple admissions A:  Continue with medication management and monitor MD orders.  Safety checks completed every 15 minutes per protocol.  R: Patient is cooperative and her behavior is appropriate.

## 2012-10-03 NOTE — Progress Notes (Signed)
D: Patient in her room, awake during this assessment. Mood and affects depressed and blunted. Although she reported feeling better, denied SI/HI and denied hallucinations.  A: Writer encouraged and supported patient. R: Patient receptive to encouragement and support. She reported that her mother would be visiting tomorrow. q 15 minute check continues as ordered to maintain safety.

## 2012-10-04 DIAGNOSIS — F3130 Bipolar disorder, current episode depressed, mild or moderate severity, unspecified: Secondary | ICD-10-CM | POA: Diagnosis not present

## 2012-10-04 NOTE — Progress Notes (Signed)
[name], female   DOB: April 22, 1975, 37 y.o.   MRN: [id number] Pt did not attend group. She was asleep in her bed.

## 2012-10-04 NOTE — Progress Notes (Signed)
[name], female   DOB: 28-Aug-1975, 37 y.o.   MRN: [id number]  D:  Pt flat blunted affect upon approach.  Pt denies SI/HI/AVH/Pain. Pt is pleasant and cooperative. Pt a little guarded, but states " I will stay on my medications and surround my self with positive people when I get out"  A: Pt was offered support and encouragement. Pt was given scheduled medications. Pt was encourage to attend groups. Q 15 minute checks were done for safety.   R: Pt is taking medication. Pt has no complaints.Pt receptive to treatment and safety maintained on unit.

## 2012-10-04 NOTE — Progress Notes (Signed)
Adult Psychoeducational Group Note  Date:  10/04/2012 Time:  10:22 AM  Group Topic/Focus:  Rediscovering Joy:   The focus of this group is to explore various ways to relieve stress in a positive manner.  Participation Level:  None  Participation Quality:  Pt left group after 5 minutes.  Affect:  Flat  Cognitive:  Appropriate  Insight: None  Engagement in Group:  Pt left group after 5 minutes.  Modes of Intervention:  Discussion, Socialization and Support  Additional Comments:  Pt came to group and engaged with what the definition of joy was, but then pt left and never returned to group.  Cathlean Cower 10/04/2012, 10:22 AM

## 2012-10-04 NOTE — [facility] Group Notes (Signed)
[facility] Group Notes:  (Counselor/Nursing/MHT/Case Management/Adjunct)  10/04/2012 1:15PM  Type of Therapy:  Group Therapy  Participation Level:  Did not attend    Summary of Progress/Problems: The topic for group was balance in life.  Pt participated in the discussion about when their life was in balance and out of balance and how this feels.  Pt discussed ways to get back in balance and short term goals they can work on to get where they want to be.    [name] 10/04/2012 2:40 PM

## 2012-10-04 NOTE — Progress Notes (Signed)
[name], female   DOB: 1975/09/04, 37 y.o.   MRN: [id number] D: Patient remains with flat, blunted affects.  She is isolative to room and does any attend many groups.  When she attends groups, she is usually disengaged.  She has poor insight and has minimal interaction with staff and others.  She denies any SI/HI/AVH.  She is hoping that her mother will come and visit her today.  A: Continue to monitor medication management and MD orders.  Safety checks completed every 15 minutes per protocol.  R: Patient remains isolative on the milieu.

## 2012-10-04 NOTE — Progress Notes (Signed)
[facility] MD Progress Note  10/04/2012 2:39 PM [name]  MRN:  [id number] Subjective:   Patient states "I slept well. I need to get my mother to pay my bills if I can't get out. I'm fine."   Objective:  The patient remains guarded, paranoid and delusional. She makes very poor eye contact and has limited insight into her reasons for being in the hospital. She is agreeable to receiving an injectable form of Abilify as the patient admits to medication noncompliance. The patient still believes that the devil is residing in her apartment.   Diagnosis:   Axis I: Bipolar, Depressed Axis II: Deferred Axis III:  Past Medical History  Diagnosis Date  . Bipolar affective   . OCD (obsessive compulsive disorder)   . Anxiety    Axis IV: economic problems, educational problems, occupational problems and other psychosocial or environmental problems Axis V: 41-50 serious symptoms  ADL's:  Intact  Sleep: Good  Appetite:  Fair  Suicidal Ideation:  Denies Homicidal Ideation:  Denies AEB (as evidenced by):  Psychiatric Specialty Exam: Review of Systems  Constitutional: Negative.   HENT: Negative.   Eyes: Negative.   Respiratory: Negative.   Cardiovascular: Negative.   Gastrointestinal: Negative.   Genitourinary: Negative.   Musculoskeletal: Negative.   Skin: Negative.   Neurological: Negative.   Endo/Heme/Allergies: Negative.   Psychiatric/Behavioral: Positive for depression and hallucinations. Negative for suicidal ideas, memory loss and substance abuse. The patient is not nervous/anxious and does not have insomnia.     Blood pressure 120/79, pulse 116, temperature 98.2 F (36.8 C), temperature source Oral, resp. rate 16, height 5\' 1"  (1.549 m), weight 87.091 kg (192 lb).Body mass index is 36.3 kg/(m^2).  General Appearance: Disheveled  Eye Solicitor::  Fair  Speech:  Slow  Volume:  Normal  Mood:  Depressed  Affect:  Flat  Thought Process:  Circumstantial  Orientation:  Full  (Time, Place, and Person)  Thought Content:  Delusions  Suicidal Thoughts:  No  Homicidal Thoughts:  No  Memory:  Immediate;   Good Recent;   Good Remote;   Good  Judgement:  Impaired  Insight:  Shallow  Psychomotor Activity:  Normal  Concentration:  Fair  Recall:  Fair  Akathisia:  No  Handed:  Right  AIMS (if indicated):     Assets:  Communication Skills Desire for Improvement Housing Intimacy Leisure Time Physical Health Resilience  Sleep:  Number of Hours: 5.5   Current Medications: Current Facility-Administered Medications  Medication Dose Route Frequency Provider Last Rate Last Dose  . acetaminophen (TYLENOL) tablet 650 mg  650 mg Oral Q6H PRN Kerry Hough, PA-C      . alum & mag hydroxide-simeth (MAALOX/MYLANTA) 200-200-20 MG/5ML suspension 30 mL  30 mL Oral Q4H PRN Kerry Hough, PA-C      . ARIPiprazole (ABILIFY) tablet 10 mg  10 mg Oral Daily Mojeed Akintayo   10 mg at 10/04/12 0748  . magnesium hydroxide (MILK OF MAGNESIA) suspension 30 mL  30 mL Oral Daily PRN Kerry Hough, PA-C      . traZODone (DESYREL) tablet 50 mg  50 mg Oral QHS,MR X 1 Kerry Hough, PA-C   50 mg at 10/03/12 2201  . trihexyphenidyl (ARTANE) tablet 2 mg  2 mg Oral Daily Mojeed Akintayo   2 mg at 10/04/12 9528    Lab Results: No results found for this or any previous visit (from the past 48 hour(s)).  Physical Findings: AIMS: Facial and Oral  Movements Muscles of Facial Expression: None, normal Lips and Perioral Area: None, normal Jaw: None, normal Tongue: None, normal,Extremity Movements Upper (arms, wrists, hands, fingers): None, normal Lower (legs, knees, ankles, toes): None, normal, Trunk Movements Neck, shoulders, hips: None, normal, Overall Severity Severity of abnormal movements (highest score from questions above): None, normal Incapacitation due to abnormal movements: None, normal Patient's awareness of abnormal movements (rate only patient's report): No Awareness,  Dental Status Current problems with teeth and/or dentures?: No Does patient usually wear dentures?: No  CIWA:    COWS:     Treatment Plan Summary: Daily contact with patient to assess and evaluate symptoms and progress in treatment Medication management  Plan:  Continue crisis management and stabilization.  Medication management: Continue Abilify 10 mg daily. Will start Abilify Sustenna long acting injection tomorrow to improve medication compliance and decrease the need for hospital admission.  Encouraged patient to attend groups and participate in group counseling sessions and activities.  Discharge plan in progress.  Continue current treatment plan.  Address health issues: Vitals reviewed and stable.   Medical Decision Making Problem Points:  Established problem, stable/improving (1) and Review of psycho-social stressors (1) Data Points:  Review of medication regiment & side effects (2)  I certify that inpatient services furnished can reasonably be expected to improve the patient's condition.   Adewale Pucillo NP-C 10/04/2012, 2:39 PM

## 2012-10-05 DIAGNOSIS — F259 Schizoaffective disorder, unspecified: Secondary | ICD-10-CM | POA: Diagnosis not present

## 2012-10-05 MED ORDER — DIPHENHYDRAMINE HCL 50 MG/ML IJ SOLN
25.0000 mg | Freq: Once | INTRAMUSCULAR | Status: AC
  Administered 2012-10-05: 25 mg via INTRAMUSCULAR
  Filled 2012-10-05: qty 0.5

## 2012-10-05 MED ORDER — DIPHENHYDRAMINE HCL 50 MG/ML IJ SOLN
INTRAMUSCULAR | Status: AC
  Filled 2012-10-05: qty 1

## 2012-10-05 MED ORDER — ARIPIPRAZOLE ER 400 MG IM SUSR
400.0000 mg | INTRAMUSCULAR | Status: DC
  Administered 2012-10-05: 400 mg via INTRAMUSCULAR
  Filled 2012-10-05: qty 400

## 2012-10-05 NOTE — Progress Notes (Signed)
[name], female   DOB: [birth year], 37 y.o.   MRN: [id number] [facility] MD Progress Note  10/05/2012 10:32 AM [name]  MRN:  [id number] Subjective: "I am worry about not paying my bill on time." Objective:  The patient reports excessive worry about not paying her bill on time due to being in the hospital, she appears withdrawn today. However, she denies suicidal ideation and psychosis but remains guarded and paranoid. She has limited insight into the reasons for being in the hospital, still thinking that her mother put her in the hospital in order to punish her.  The patient still believes that the devil is residing in her apartment. She is agreeable to receiving an injectable form of Abilify today.   Diagnosis:   Axis I: Schizoaffective disorder Axis II: Deferred Axis III:  Past Medical History  Diagnosis Date   Axis IV: economic problems, educational problems, occupational problems and other psychosocial or environmental problems Axis V: 41-50 serious symptoms  ADL's:  Intact  Sleep: Good  Appetite:  Fair  Suicidal Ideation:  Denies Homicidal Ideation:  Denies AEB (as evidenced by):  Psychiatric Specialty Exam: Review of Systems  Constitutional: Negative.   HENT: Negative.   Eyes: Negative.   Respiratory: Negative.   Cardiovascular: Negative.   Gastrointestinal: Negative.   Genitourinary: Negative.   Musculoskeletal: Negative.   Skin: Negative.   Neurological: Negative.   Endo/Heme/Allergies: Negative.   Psychiatric/Behavioral: Positive for depression and hallucinations. Negative for suicidal ideas, memory loss and substance abuse. The patient is not nervous/anxious and does not have insomnia.     Blood pressure 128/80, pulse 91, temperature 97 F (36.1 C), temperature source Oral, resp. rate 16, height 5\' 1"  (1.549 m), weight 87.091 kg (192 lb).Body mass index is 36.3 kg/(m^2).  General Appearance: Disheveled  Eye Solicitor::  Fair  Speech:  Slow   Volume:  Normal  Mood:  Depressed  Affect:  Flat  Thought Process:  Circumstantial  Orientation:  Full (Time, Place, and Person)  Thought Content:  Delusions  Suicidal Thoughts:  No  Homicidal Thoughts:  No  Memory:  Immediate;   Good Recent;   Good Remote;   Good  Judgement:  Impaired  Insight:  Shallow  Psychomotor Activity:  Normal  Concentration:  Fair  Recall:  Fair  Akathisia:  No  Handed:  Right  AIMS (if indicated):     Assets:  Communication Skills Desire for Improvement Housing Intimacy Leisure Time Physical Health Resilience  Sleep:  Number of Hours: 5.5   Current Medications: Current Facility-Administered Medications  Medication Dose Route Frequency Provider Last Rate Last Dose  . acetaminophen (TYLENOL) tablet 650 mg  650 mg Oral Q6H PRN Kerry Hough, PA-C      . alum & mag hydroxide-simeth (MAALOX/MYLANTA) 200-200-20 MG/5ML suspension 30 mL  30 mL Oral Q4H PRN Kerry Hough, PA-C      . ARIPiprazole (ABILIFY) tablet 10 mg  10 mg Oral Daily Torsten Weniger   10 mg at 10/05/12 0744  . ARIPiprazole SUSR 400 mg  400 mg Intramuscular Q28 days Addilee Neu      . magnesium hydroxide (MILK OF MAGNESIA) suspension 30 mL  30 mL Oral Daily PRN Kerry Hough, PA-C      . traZODone (DESYREL) tablet 50 mg  50 mg Oral QHS,MR X 1 Kerry Hough, PA-C   50 mg at 10/03/12 2201  . trihexyphenidyl (ARTANE) tablet 2 mg  2 mg Oral Daily Emad Brechtel  2 mg at 10/05/12 1610    Lab Results: No results found for this or any previous visit (from the past 48 hour(s)).  Physical Findings: AIMS: Facial and Oral Movements Muscles of Facial Expression: None, normal Lips and Perioral Area: None, normal Jaw: None, normal Tongue: None, normal,Extremity Movements Upper (arms, wrists, hands, fingers): None, normal Lower (legs, knees, ankles, toes): None, normal, Trunk Movements Neck, shoulders, hips: None, normal, Overall Severity Severity of abnormal movements (highest  score from questions above): None, normal Incapacitation due to abnormal movements: None, normal Patient's awareness of abnormal movements (rate only patient's report): No Awareness, Dental Status Current problems with teeth and/or dentures?: No Does patient usually wear dentures?: No  CIWA:    COWS:     Treatment Plan Summary: Daily contact with patient to assess and evaluate symptoms and progress in treatment Medication management  Plan:  Continue crisis management and stabilization.  Medication management: Continue Abilify 10 mg daily. Will start Abilify Sustenna long acting injection tomorrow to improve medication compliance and decrease the need for hospital admission.  Encouraged patient to attend groups and participate in group counseling sessions and activities.  Discharge plan in progress.  Continue current treatment plan.  Address health issues: Vitals reviewed and stable.  Abilify Maintaina 400mg  IM every 28 days, first dose today. Benadryl 25mg  IM x 1 dose for EPR prevention. Medical Decision Making Problem Points:  Established problem, stable/improving (1) and Review of psycho-social stressors (1) Data Points:  Review of medication regiment & side effects (2)  I certify that inpatient services furnished can reasonably be expected to improve the patient's condition.   Thedore Mins, MD 10/05/2012, 10:32 AM

## 2012-10-05 NOTE — Progress Notes (Signed)
D: Patient denies SI/HI and auditory and visual hallucinations. The patient has a depressed mood and an appropriate affect. The patient reports sleeping fairly well and states that she is feeling "a little better today." The patient is isolative to her room and isn't having much interaction within the milieu.  A: Patient given emotional support from RN. Patient encouraged to come to staff with concerns and/or questions. Patient's medication routine continued. Patient's orders and plan of care reviewed. Patient given education regarding the importance of attending groups.  R: Patient remains cooperative with medications but remains isolative to her room. Patient states that she just gets "too sad sometimes" and that she is "too tired" to participate fully. Will continue to monitor patient q15 minutes for safety.

## 2012-10-05 NOTE — [facility] Group Notes (Signed)
[facility] LCSW Aftercare Discharge Planning Group Note   10/05/2012 8:06 AM  Participation Quality:  Minimal  Mood/Affect:  Flat  Depression Rating:  5  Anxiety Rating:  7  Thoughts of Suicide:  No Will you contract for safety?   NA  Current AVH:  No  Plan for Discharge/Comments:  States she is doing a bit better.  OK with getting injection today.  OK with being here thru the weekend.  Transportation Means: family  Supports: family  [name], [name]

## 2012-10-05 NOTE — [facility] Group Notes (Signed)
[facility] LCSW Group Therapy  10/05/2012  1:05 PM  Type of Therapy:  Group therapy  Participation Level:  Did not attend    Summary of Progress/Problems:  Chaplain was here to lead a group on themes of hope and courage.  Ida Rogue 10/05/2012 2:48 PM

## 2012-10-06 DIAGNOSIS — F259 Schizoaffective disorder, unspecified: Secondary | ICD-10-CM | POA: Diagnosis not present

## 2012-10-06 NOTE — Progress Notes (Signed)
[name], female   DOB: July 01, 1975, 37 y.o.   MRN: [id number] D. The patient isolated in her room most of the evening. Was pleasant upon approach. Stated that her mother visited and it went well. A. Encouraged to attend evening group. Administered HS medication. R. She did not attend evening group. Compliant with medication.

## 2012-10-06 NOTE — [facility] Group Notes (Signed)
[facility] Group Notes: (Clinical Social Work)   10/06/2012      Type of Therapy:  Group Therapy   Participation Level:  Did Not Attend    Ambrose Mantle, LCSW 10/06/2012, 12:22 PM

## 2012-10-06 NOTE — Progress Notes (Signed)
[name], female   DOB: 1976-03-05, 37 y.o.   MRN: [id number]   [facility] MD Progress Note  10/06/2012 10:24 AM [name]  MRN:  [id number] Subjective:  Patient states "I'm a little drowsy from the medication but I'm ok. I'm glad I came now. I was upset that my mother sent me to the hospital. She can tell when I'm manic and I guess I was acting that way. I'm ok with getting the injections. It's a good idea since I forget to take the medications most of the time."   Objective:  Patient observed resting in bed this morning and appears depressed. Patient remains somewhat guarded during conversation and is not forthcoming with information. Patient encouraged to attend groups.   Diagnosis:   Axis I: Schizoaffective disorder Axis II: Deferred Axis III:  Past Medical History  Diagnosis Date   Axis IV: economic problems, educational problems, occupational problems and other psychosocial or environmental problems Axis V: 41-50 serious symptoms  ADL's:  Intact  Sleep: Good  Appetite:  Fair  Suicidal Ideation:  Denies Homicidal Ideation:  Denies AEB (as evidenced by):  Psychiatric Specialty Exam: Review of Systems  Constitutional: Negative.   HENT: Negative.   Eyes: Negative.   Respiratory: Negative.   Cardiovascular: Negative.   Gastrointestinal: Negative.   Genitourinary: Negative.   Musculoskeletal: Negative.   Skin: Negative.   Neurological: Negative.   Endo/Heme/Allergies: Negative.   Psychiatric/Behavioral: Positive for depression. Negative for suicidal ideas, hallucinations, memory loss and substance abuse. The patient is not nervous/anxious and does not have insomnia.     Blood pressure 123/80, pulse 90, temperature 97 F (36.1 C), temperature source Oral, resp. rate 16, height 5\' 1"  (1.549 m), weight 87.091 kg (192 lb).Body mass index is 36.3 kg/(m^2).  General Appearance: Disheveled  Eye Solicitor::  Fair  Speech:  Slow  Volume:  Normal  Mood:   Depressed  Affect:  Flat  Thought Process:  Circumstantial  Orientation:  Full (Time, Place, and Person)  Thought Content:  Delusions  Suicidal Thoughts:  No  Homicidal Thoughts:  No  Memory:  Immediate;   Good Recent;   Good Remote;   Good  Judgement:  Impaired  Insight:  Shallow  Psychomotor Activity:  Normal  Concentration:  Fair  Recall:  Fair  Akathisia:  No  Handed:  Right  AIMS (if indicated):     Assets:  Communication Skills Desire for Improvement Housing Intimacy Leisure Time Physical Health Resilience  Sleep:  Number of Hours: 5.5   Current Medications: Current Facility-Administered Medications  Medication Dose Route Frequency Provider Last Rate Last Dose  . acetaminophen (TYLENOL) tablet 650 mg  650 mg Oral Q6H PRN Kerry Hough, PA-C      . alum & mag hydroxide-simeth (MAALOX/MYLANTA) 200-200-20 MG/5ML suspension 30 mL  30 mL Oral Q4H PRN Kerry Hough, PA-C      . ARIPiprazole (ABILIFY) tablet 10 mg  10 mg Oral Daily Mojeed Akintayo   10 mg at 10/06/12 0803  . ARIPiprazole SUSR 400 mg  400 mg Intramuscular Q28 days Mojeed Akintayo   400 mg at 10/05/12 1100  . magnesium hydroxide (MILK OF MAGNESIA) suspension 30 mL  30 mL Oral Daily PRN Kerry Hough, PA-C      . traZODone (DESYREL) tablet 50 mg  50 mg Oral QHS,MR X 1 Kerry Hough, PA-C   50 mg at 10/05/12 2145  . trihexyphenidyl (ARTANE) tablet 2 mg  2 mg Oral Daily  Mojeed Akintayo   2 mg at 10/06/12 9604    Lab Results: No results found for this or any previous visit (from the past 48 hour(s)).  Physical Findings: AIMS: Facial and Oral Movements Muscles of Facial Expression: None, normal Lips and Perioral Area: None, normal Jaw: None, normal Tongue: None, normal,Extremity Movements Upper (arms, wrists, hands, fingers): None, normal Lower (legs, knees, ankles, toes): None, normal, Trunk Movements Neck, shoulders, hips: None, normal, Overall Severity Severity of abnormal movements (highest score  from questions above): None, normal Incapacitation due to abnormal movements: None, normal Patient's awareness of abnormal movements (rate only patient's report): No Awareness, Dental Status Current problems with teeth and/or dentures?: No Does patient usually wear dentures?: No  CIWA:    COWS:     Treatment Plan Summary: Daily contact with patient to assess and evaluate symptoms and progress in treatment Medication management  Plan:  Continue crisis management and stabilization.  Medication management: Continue Abilify 10 mg daily. Reviewed with patient who stated no untoward effects from oral medication or from [facility].  Encouraged patient to attend groups and participate in group counseling sessions and activities.  Discharge plan in progress. Anticipate discharge Monday if no side effects from medication observed over the weekend.  Continue current treatment plan.  Address health issues: Vitals reviewed and stable.  Abilify Maintaina 400mg  IM every 28 days, first dose given yesterday.   Medical Decision Making Problem Points:  Established problem, stable/improving (1) and Review of psycho-social stressors (1) Data Points:  Review of medication regiment & side effects (2)  I certify that inpatient services furnished can reasonably be expected to improve the patient's condition.   Fransisca Kaufmann, NP-C 10/06/2012, 10:24 AM

## 2012-10-06 NOTE — Progress Notes (Signed)
[name], female   DOB: [birth year], 37 y.o.   MRN: [id number] D. The patient has a blunted mood and affect, but is pleasant on approach. Stated that she was feeling much better and hoped that she would be discharged over the weekend. A. Met with patient 1:1. Verbal support given. Administered HS medication. R. The patient is compliant with medication. Safety maintained.

## 2012-10-06 NOTE — Progress Notes (Signed)
Patient ID: ANDRENA MARGERUM, female   DOB: [birth year], 37 y.o.   MRN: [id number] Psychoeducational Group Note  Date:  10/06/2012 Time:1000am  Group Topic/Focus:  Identifying Needs:   The focus of this group is to help patients identify their personal needs that have been historically problematic and identify healthy behaviors to address their needs.  Participation Level:  Did Not Attend  Participation Quality:    Affect:   Cognitive:  Insight: Engagement in Group: Additional Comments:  Inventory and Psychoeducational group   Valente David 10/06/2012,10:44 AM

## 2012-10-06 NOTE — Progress Notes (Signed)
[name], female   DOB: [birth year], 37 y.o.   MRN: [id number] 10-06-2012 nursing shift note: A: pt has not been visible in the milieu and did not come to am group. She has had some hi toward her mother and stated she is needing a medication adjustment . She has had no complaints of pain and has not voiced any needs. A: she is taking her medications without any adverse effects. Staff is supportive and encouraging her. R: on her inventory sheet she wrote slept well, appetite good, energy normal, attention good and she didn't address her depression and hopelessness. She denied any si/hi. She took her abilify injection yesterday and hopes to be discharge on Sunday or Monday. RN will monitor and Q 15 min ck's continue.

## 2012-10-07 DIAGNOSIS — F259 Schizoaffective disorder, unspecified: Secondary | ICD-10-CM | POA: Diagnosis not present

## 2012-10-07 NOTE — Progress Notes (Signed)
[name], female   DOB: [birth year], 37 y.o.   MRN: [id number] Psychoeducational Group Note  Date:  10/07/2012 Time:  1000am  Group Topic/Focus:  Making Healthy Choices:   The focus of this group is to help patients identify negative/unhealthy choices they were using prior to admission and identify positive/healthier coping strategies to replace them upon discharge.  Participation Level:  Did Not Attend  Participation Quality:    Affect: Cognitive:  Insight:  Engagement in Group:  Additional Comments:  Inventory and health support systems.   Valente David 10/07/2012,11:20 AM

## 2012-10-07 NOTE — Progress Notes (Signed)
[name], female   DOB: 1975-06-02, 37 y.o.   MRN: [id number] 10-07-12 nursing shift note: D: pt has not been visible in the milieu and not coming to groups. She denies any si/hi/av presently. She is not showing any adverse effects from her medications. She stated she will be discharged on 10-08-12. A: staff continues to encourage her to come to groups and has made themselves available to her if she has any needs. Staff continues to encourage and support. R: on her inventory sheet she wrote: slept well, appetite good, energy normal and attention good. She is not having any physical problems. After discharge she plans to take her medications. RN will monitor and Q 15 min ck's continue.

## 2012-10-07 NOTE — Progress Notes (Signed)
[name], female   DOB: Apr 13, 1975, 37 y.o.   MRN: [id number] D. The patient was pleasant and appropriate. Isolated in her room. The patient stated she is going home tomorrow. Denied any a/v hallucinations. Denied any suicidal/homicidal ideation. A. Encouraged to attend evening group. Administered HS medication. R. Did not attend wrap up group. Compliant with medication.

## 2012-10-07 NOTE — Progress Notes (Signed)
[facility] MD Progress Note  10/07/2012 2:37 PM [name]  MRN:  [id number] Subjective:  Patient has minimal symptoms of depression, denies suicidal ideations, but anxiety high because she needs to pay her bills tomorrow--discharge is planned tomorrow and she needs to get things done.  Sleep and appetite are good, affect bright, and medications are doing well for her.  Diagnosis:   Axis I: Schizoaffective Disorder Axis II: Deferred Axis III:  Past Medical History  Diagnosis Date  . Bipolar affective   . OCD (obsessive compulsive disorder)   . Anxiety    Axis IV: other psychosocial or environmental problems, problems related to social environment and problems with primary support group Axis V: 41-50 serious symptoms  ADL's:  Intact  Sleep: Good  Appetite:  Good  Suicidal Ideation:  Denies Homicidal Ideation:  Denies  Psychiatric Specialty Exam: Review of Systems  Constitutional: Negative.   HENT: Negative.   Eyes: Negative.   Respiratory: Negative.   Cardiovascular: Negative.   Gastrointestinal: Negative.   Genitourinary: Negative.   Musculoskeletal: Negative.   Skin: Negative.   Neurological: Negative.   Endo/Heme/Allergies: Negative.     Blood pressure 123/73, pulse 112, temperature 98.9 F (37.2 C), temperature source Oral, resp. rate 18, height 5\' 1"  (1.549 m), weight 87.091 kg (192 lb).Body mass index is 36.3 kg/(m^2).  General Appearance: Casual  Eye Contact::  Good  Speech:  Normal Rate  Volume:  Normal  Mood:  Anxious  Affect:  Congruent  Thought Process:  Coherent  Orientation:  Full (Time, Place, and Person)  Thought Content:  WDL  Suicidal Thoughts:  No  Homicidal Thoughts:  No  Memory:  Immediate;   Fair Recent;   Fair Remote;   Fair  Judgement:  Fair  Insight:  Fair  Psychomotor Activity:  Normal  Concentration:  Fair  Recall:  Fair  Akathisia:  No  Handed:  Right  AIMS (if indicated):     Assets:  Communication Skills Resilience Social  Support  Sleep:  Number of Hours: 6.25   Current Medications: Current Facility-Administered Medications  Medication Dose Route Frequency Provider Last Rate Last Dose  . acetaminophen (TYLENOL) tablet 650 mg  650 mg Oral Q6H PRN Kerry Hough, PA-C      . alum & mag hydroxide-simeth (MAALOX/MYLANTA) 200-200-20 MG/5ML suspension 30 mL  30 mL Oral Q4H PRN Kerry Hough, PA-C      . ARIPiprazole (ABILIFY) tablet 10 mg  10 mg Oral Daily Mojeed Akintayo   10 mg at 10/07/12 0818  . ARIPiprazole SUSR 400 mg  400 mg Intramuscular Q28 days Mojeed Akintayo   400 mg at 10/05/12 1100  . magnesium hydroxide (MILK OF MAGNESIA) suspension 30 mL  30 mL Oral Daily PRN Kerry Hough, PA-C      . traZODone (DESYREL) tablet 50 mg  50 mg Oral QHS,MR X 1 Kerry Hough, PA-C   50 mg at 10/06/12 2117  . trihexyphenidyl (ARTANE) tablet 2 mg  2 mg Oral Daily Mojeed Akintayo   2 mg at 10/07/12 0818    Lab Results: No results found for this or any previous visit (from the past 48 hour(s)).  Physical Findings: AIMS: Facial and Oral Movements Muscles of Facial Expression: None, normal Lips and Perioral Area: None, normal Jaw: None, normal Tongue: None, normal,Extremity Movements Upper (arms, wrists, hands, fingers): None, normal Lower (legs, knees, ankles, toes): None, normal, Trunk Movements Neck, shoulders, hips: None, normal, Overall Severity Severity of abnormal movements (highest score  from questions above): None, normal Incapacitation due to abnormal movements: None, normal Patient's awareness of abnormal movements (rate only patient's report): No Awareness, Dental Status Current problems with teeth and/or dentures?: No Does patient usually wear dentures?: No  CIWA:    COWS:     Treatment Plan Summary: Daily contact with patient to assess and evaluate symptoms and progress in treatment Medication management  Plan:  Review of chart, vital signs, medications, and notes. 1-Individual and group  therapy 2-Medication management for depression and anxiety:  Medications reviewed with the patient and she stated no untoward effects, no changes made 3-Coping skills for depression, anxiety 4-Continue crisis stabilization and management 5-Address health issues--monitoring vital signs, stable 6-Treatment plan in progress to prevent relapse of depression and anxiety  Medical Decision Making Problem Points:  Established problem, stable/improving (1) and Review of psycho-social stressors (1) Data Points:  Review of medication regiment & side effects (2)  I certify that inpatient services furnished can reasonably be expected to improve the patient's condition.   Nanine Means, PMH-NP 10/07/2012, 2:37 PM  Reviewed the information documented and agree with the treatment plan.  Wyona Neils,JANARDHAHA R. 10/08/2012 12:31 PM

## 2012-10-07 NOTE — [facility] Group Notes (Signed)
[facility] Group Notes: (Clinical Social Work)   10/07/2012      Type of Therapy:  Group Therapy   Participation Level:  Did Not Attend    Ambrose Mantle, LCSW 10/07/2012, 12:32 PM

## 2012-10-08 DIAGNOSIS — F259 Schizoaffective disorder, unspecified: Secondary | ICD-10-CM | POA: Diagnosis not present

## 2012-10-08 MED ORDER — ARIPIPRAZOLE 10 MG PO TABS: 10.0000 mg | ORAL_TABLET | Freq: Every day | ORAL | Status: DC

## 2012-10-08 MED ORDER — ARIPIPRAZOLE ER 400 MG IM SUSR: 400.0000 mg | INTRAMUSCULAR | Status: DC

## 2012-10-08 MED ORDER — TRIHEXYPHENIDYL HCL 2 MG PO TABS
2.0000 mg | ORAL_TABLET | Freq: Every day | ORAL | Status: DC
Start: 2012-10-08 — End: 2012-12-31

## 2012-10-08 MED ORDER — TRAZODONE HCL 50 MG PO TABS: 50.0000 mg | ORAL_TABLET | Freq: Every evening | ORAL | Status: DC | PRN

## 2012-10-08 NOTE — Progress Notes (Signed)
Recreation Therapy Notes  Date: 08.04.2014 Time: 9:30am Location: [address] Day Room  Group Topic: Leisure Education  Goal Area(s) Addresses:  Patient will verbalize activity of interest by end of group session. Patient will verbalize the ability to use positive leisure/recreation as a coping mechanism.  Behavioral Response: Did not attend.    Marykay Lex Anshu Wehner, LRT/CTRS  Jearl Klinefelter 10/08/2012 8:36 PM

## 2012-10-08 NOTE — Progress Notes (Signed)
D/C instructions/meds/follow-up appointments reviewed, pt verbalized understanding, pt's belongings returned to pt, samples given. 

## 2012-10-08 NOTE — [facility] Suicide Risk Assessment (Signed)
Suicide Risk Assessment  Discharge Assessment     Demographic Factors:  Low socioeconomic status, Unemployed and female  Mental Status Per Nursing Assessment::   On Admission:     Current Mental Status by Physician: patient denies suicidal ideation, intent or plan  Loss Factors: Financial problems/change in socioeconomic status  Historical Factors: Impulsivity  Risk Reduction Factors:   Sense of responsibility to family, Living with another person, especially a relative and Positive social support  Continued Clinical Symptoms:  Alcohol/Substance Abuse/Dependencies  Cognitive Features That Contribute To Risk:  Closed-mindedness Polarized thinking    Suicide Risk:  Minimal: No identifiable suicidal ideation.  Patients presenting with no risk factors but with morbid ruminations; may be classified as minimal risk based on the severity of the depressive symptoms  Discharge Diagnoses:   AXIS I:  Schizoaffective disorder, unspecified condition              Cannabis use disorder AXIS II:  Deferred AXIS III:   Past Medical History  Diagnosis Date   AXIS IV:  other psychosocial or environmental problems, problems related to social environment and problems with primary support group AXIS V:  61-70 mild symptoms  Plan Of Care/Follow-up recommendations:  Activity:  as tolerated Diet:  healthy Tests:  routine blood work Other:  patient to keep her after care appointment  Is patient on multiple antipsychotic therapies at discharge:  No   Has Patient had three or more failed trials of antipsychotic monotherapy by history:  No  Recommended Plan for Multiple Antipsychotic Therapies: N/A  Preston Weill,MD 10/08/2012, 10:20 AM

## 2012-10-08 NOTE — Progress Notes (Signed)
Adult Psychoeducational Group Note  Date:  10/08/2012 Time:  11:00am Group Topic/Focus:  Goals Group:   The focus of this group is to help patients establish daily goals to achieve during treatment and discuss how the patient can incorporate goal setting into their daily lives to aide in recovery.  Participation Level:  Did Not Attend  Participation Quality:    Affect:    Cognitive:   Insight:  Engagement in Group:  Modes of Intervention:    Additional Comments:  Pt did not attend group.  Shelly Bombard D 10/08/2012, 12:01 PM
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
## 2012-10-08 NOTE — Progress Notes (Signed)
[facility] Adult Case Management Discharge Plan :  Will you be returning to the same living situation after discharge: Yes,  home At discharge, do you have transportation home?:Yes,  mother Do you have the ability to pay for your medications:Yes,  MCD  Release of information consent forms completed and in the chart;  Patient's signature needed at discharge.  Patient to Follow up at: Follow-up Information   Follow up with [facility]. (Go to the walk-in clinic M-F between 8 and 9AM for your hospital follow up appointment, unless you already have an appointment in the next 14 days.  If so, just go to that appointment.)    Contact information:   [address]  [facility]  [phone]      Patient denies SI/HI:   Yes,  yes    Safety Planning and Suicide Prevention discussed:  Yes,  yes  [name] 10/08/2012, 10:09 AM

## 2012-10-08 NOTE — BHH Suicide Risk Assessment (Signed)
[facility] INPATIENT:  Family/Significant Other Suicide Prevention Education  Suicide Prevention Education:  Education Completed; [name], mother, [phone] has been identified by the patient as the family member/significant other with whom the patient will be residing, and identified as the person(s) who will aid the patient in the event of a mental health crisis (suicidal ideations/suicide attempt).  With written consent from the patient, the family member/significant other has been provided the following suicide prevention education, prior to the and/or following the discharge of the patient.  The suicide prevention education provided includes the following:  Suicide risk factors  Suicide prevention and interventions  National Suicide Hotline telephone number  [facility] assessment telephone number  [facility] Emergency Assistance 911  [facility] and/or Residential Mobile Crisis Unit telephone number  Request made of family/significant other to:  Remove weapons (e.g., guns, rifles, knives), all items previously/currently identified as safety concern.    Remove drugs/medications (over-the-counter, prescriptions, illicit drugs), all items previously/currently identified as a safety concern.  The family member/significant other verbalizes understanding of the suicide prevention education information provided.  The family member/significant other agrees to remove the items of safety concern listed above.  [name] B 10/08/2012, 11:50 AM

## 2012-10-08 NOTE — Tx Team (Signed)
  Interdisciplinary Treatment Plan Update   Date Reviewed:  10/08/2012  Time Reviewed:  10:05 AM  Progress in Treatment:   Attending groups: Yes Participating in groups: Yes Taking medication as prescribed: Yes  Tolerating medication: Yes Family/Significant other contact made: Yes  Patient understands diagnosis: Yes  Discussing patient identified problems/goals with staff: Yes Medical problems stabilized or resolved: Yes Denies suicidal/homicidal ideation: Yes Patient has not harmed self or others: Yes  For review of initial/current patient goals, please see plan of care.  Estimated Length of Stay:  D/C today  Reason for Continuation of Hospitalization:   New Problems/Goals identified:  N/A  Discharge Plan or Barriers:   return home, follow up outpt  Additional Comments:  Attendees:  Signature: Thedore Mins, MD 10/08/2012 10:05 AM   Signature: Richelle Ito, LCSW 10/08/2012 10:05 AM  Signature: Fransisca Kaufmann, NP 10/08/2012 10:05 AM  Signature: Joslyn Devon, RN 10/08/2012 10:05 AM  Signature:  10/08/2012 10:05 AM  Signature:  10/08/2012 10:05 AM  Signature:   10/08/2012 10:05 AM  Signature:    Signature:    Signature:    Signature:    Signature:    Signature:      Scribe for Treatment Team:   Richelle Ito, LCSW  10/08/2012 10:05 AM

## 2012-10-12 NOTE — Progress Notes (Signed)
Patient Discharge Instructions:  No documentation was sent as there was no ROI available.  [name], 10/12/2012, 3:41 PM

## 2012-10-12 NOTE — Discharge Summary (Signed)
Seen and agreed. Sadey Yandell, MD 

## 2012-10-15 DIAGNOSIS — F3110 Bipolar disorder, current episode manic without psychotic features, unspecified: Secondary | ICD-10-CM | POA: Diagnosis not present

## 2012-12-18 DIAGNOSIS — F3110 Bipolar disorder, current episode manic without psychotic features, unspecified: Secondary | ICD-10-CM | POA: Diagnosis not present

## 2012-12-22 ENCOUNTER — Ambulatory Visit (HOSPITAL_COMMUNITY)
Admission: AD | Admit: 2012-12-22 | Discharge: 2012-12-22 | Disposition: A | Discharge status: Home / Self Care | Payer: Medicare Other | Attending: Psychiatry | Admitting: Psychiatry

## 2012-12-22 ENCOUNTER — Inpatient Hospital Stay (HOSPITAL_COMMUNITY): Admit: 2012-12-22 | Payer: Self-pay

## 2012-12-22 NOTE — BH Assessment (Signed)
Pt presented to [facility] psychotic and agitated. At Laverle Hobby, [facility] recommendation the writer called 911 and requested police assistance with keeping Pt safe and on premises until [facility] staff could go to [employer] office and petition for involuntary commitment. Per Verne Spurr, PA Pt needs to be transported to [facility] for medical clearance. Called [phone] and spoke with RN and notified of situation.  Harlin Rain Ria Comment, [facility] Triage Specialist

## 2012-12-23 ENCOUNTER — Encounter (HOSPITAL_COMMUNITY): Payer: Self-pay | Admitting: Emergency Medicine

## 2012-12-23 ENCOUNTER — Emergency Department (EMERGENCY_DEPARTMENT_HOSPITAL)
Admission: EM | Admit: 2012-12-23 | Discharge: 2012-12-24 | Disposition: A | Discharge status: Other Acute Inpatient Hospital | Payer: Medicare Other | Source: Home / Self Care | Attending: Emergency Medicine | Admitting: Emergency Medicine

## 2012-12-23 DIAGNOSIS — F411 Generalized anxiety disorder: Secondary | ICD-10-CM | POA: Insufficient documentation

## 2012-12-23 DIAGNOSIS — F3112 Bipolar disorder, current episode manic without psychotic features, moderate: Secondary | ICD-10-CM | POA: Diagnosis not present

## 2012-12-23 DIAGNOSIS — F3110 Bipolar disorder, current episode manic without psychotic features, unspecified: Secondary | ICD-10-CM

## 2012-12-23 DIAGNOSIS — F429 Obsessive-compulsive disorder, unspecified: Secondary | ICD-10-CM | POA: Insufficient documentation

## 2012-12-23 DIAGNOSIS — F29 Unspecified psychosis not due to a substance or known physiological condition: Secondary | ICD-10-CM | POA: Diagnosis not present

## 2012-12-23 DIAGNOSIS — Z79899 Other long term (current) drug therapy: Secondary | ICD-10-CM | POA: Insufficient documentation

## 2012-12-23 DIAGNOSIS — F3012 Manic episode without psychotic symptoms, moderate: Secondary | ICD-10-CM | POA: Insufficient documentation

## 2012-12-23 DIAGNOSIS — F259 Schizoaffective disorder, unspecified: Secondary | ICD-10-CM | POA: Diagnosis not present

## 2012-12-23 DIAGNOSIS — F17200 Nicotine dependence, unspecified, uncomplicated: Secondary | ICD-10-CM | POA: Insufficient documentation

## 2012-12-23 LAB — SALICYLATE LEVEL: Salicylate Lvl: <2 mg/dL — ABNORMAL LOW (ref 2.8–20.0)

## 2012-12-23 LAB — COMPREHENSIVE METABOLIC PANEL
Albumin: 4.6 g/dL (ref 3.5–5.2)
Alkaline Phosphatase: 56 U/L (ref 39–117)
BUN: 17 mg/dL (ref 6–23)
Calcium: 10.2 mg/dL (ref 8.4–10.5)
Creatinine, Ser: 1.33 mg/dL — ABNORMAL HIGH (ref 0.50–1.10)
GFR calc Af Amer: 59 mL/min — ABNORMAL LOW (ref >90)
Glucose, Bld: 138 mg/dL — ABNORMAL HIGH (ref 70–99)
Total Protein: 8.8 g/dL — ABNORMAL HIGH (ref 6.0–8.3)

## 2012-12-23 LAB — URINE MICROSCOPIC-ADD ON

## 2012-12-23 LAB — ETHANOL: Alcohol, Ethyl (B): <11 mg/dL (ref 0–11)

## 2012-12-23 LAB — CBC
HCT: 37.3 % (ref 36.0–46.0)
Hemoglobin: 12.9 g/dL (ref 12.0–15.0)
MCH: 29.4 pg (ref 26.0–34.0)
MCHC: 34.6 g/dL (ref 30.0–36.0)
MCV: 85 fL (ref 78.0–100.0)
RDW: 13.5 % (ref 11.5–15.5)

## 2012-12-23 LAB — URINALYSIS, ROUTINE W REFLEX MICROSCOPIC
Bilirubin Urine: NEGATIVE
Glucose, UA: NEGATIVE mg/dL
Ketones, ur: NEGATIVE mg/dL
Protein, ur: NEGATIVE mg/dL

## 2012-12-23 LAB — RAPID URINE DRUG SCREEN, HOSP PERFORMED
Barbiturates: NOT DETECTED
Cocaine: NOT DETECTED

## 2012-12-23 MED ORDER — IBUPROFEN 200 MG PO TABS: 600.0000 mg | ORAL_TABLET | Freq: Three times a day (TID) | ORAL | Status: DC | PRN

## 2012-12-23 MED ORDER — ONDANSETRON HCL 4 MG PO TABS: 4.0000 mg | ORAL_TABLET | Freq: Three times a day (TID) | ORAL | Status: DC | PRN

## 2012-12-23 MED ORDER — DIPHENHYDRAMINE HCL 50 MG/ML IJ SOLN
50.0000 mg | Freq: Once | INTRAMUSCULAR | Status: AC
  Administered 2012-12-23: 50 mg via INTRAMUSCULAR
  Filled 2012-12-23: qty 1

## 2012-12-23 MED ORDER — NICOTINE 21 MG/24HR TD PT24: 21.0000 mg | MEDICATED_PATCH | Freq: Every day | TRANSDERMAL | Status: DC

## 2012-12-23 MED ORDER — ZIPRASIDONE MESYLATE 20 MG IM SOLR
20.0000 mg | Freq: Once | INTRAMUSCULAR | Status: AC
  Administered 2012-12-23: 20 mg via INTRAMUSCULAR
  Filled 2012-12-23: qty 20

## 2012-12-23 MED ORDER — LORAZEPAM 2 MG/ML IJ SOLN
2.0000 mg | Freq: Once | INTRAMUSCULAR | Status: AC
  Administered 2012-12-23: 2 mg via INTRAMUSCULAR
  Filled 2012-12-23: qty 1

## 2012-12-23 MED ORDER — LORAZEPAM 1 MG PO TABS
1.0000 mg | ORAL_TABLET | Freq: Three times a day (TID) | ORAL | Status: DC | PRN
  Administered 2012-12-23 – 2012-12-24 (×4): 1 mg via ORAL
  Filled 2012-12-23 (×4): qty 1

## 2012-12-23 MED ORDER — ACETAMINOPHEN 325 MG PO TABS: 650.0000 mg | ORAL_TABLET | ORAL | Status: DC | PRN

## 2012-12-23 MED ORDER — ALUM & MAG HYDROXIDE-SIMETH 200-200-20 MG/5ML PO SUSP: 30.0000 mL | ORAL | Status: DC | PRN

## 2012-12-23 MED ORDER — ARIPIPRAZOLE 10 MG PO TABS
10.0000 mg | ORAL_TABLET | Freq: Two times a day (BID) | ORAL | Status: DC
  Administered 2012-12-23 – 2012-12-24 (×3): 10 mg via ORAL
  Filled 2012-12-23 (×5): qty 1

## 2012-12-23 MED ORDER — HALOPERIDOL LACTATE 5 MG/ML IJ SOLN
5.0000 mg | Freq: Once | INTRAMUSCULAR | Status: AC
  Administered 2012-12-23: 5 mg via INTRAMUSCULAR
  Filled 2012-12-23: qty 1

## 2012-12-23 MED ORDER — ZOLPIDEM TARTRATE 5 MG PO TABS
5.0000 mg | ORAL_TABLET | Freq: Every evening | ORAL | Status: DC | PRN
  Administered 2012-12-23 – 2012-12-24 (×2): 5 mg via ORAL
  Filled 2012-12-23 (×2): qty 1

## 2012-12-23 NOTE — BH Assessment (Signed)
BHH Assessment Progress Note     Spoke with Verne Spurr, PA-C; she declined patient due to acuity and the need for a higher level of care; recommending long term hospitalization, preferably at [facility]. Patient has had multiple inpatient hospitalizations, with poor psychiatric compliance.   Shon Baton, MSW, LCSW, LCASA, CSW-G

## 2012-12-23 NOTE — ED Notes (Signed)
Ford from Triage just called and Nanine Means NP cannot get TTS machine to work so she will see pt in the AM.

## 2012-12-23 NOTE — Progress Notes (Addendum)
Contacted Coralee North at [facility] [phone]) to inform that staff that this writer is faxing pt. Lab work. Writer was informed that patient medical history and physical needed to be faxed as well.   Rodman Pickle, MHT   Contacted Ann at [employer] who confirmed that the documents listed above had been received.  Rodman Pickle, MHT

## 2012-12-23 NOTE — BH Assessment (Addendum)
BHH Assessment Progress Note Update: Received call from Danny from [facility] stating pt declined by Dr. Otelia Santee due to pt's high acuity @ 1650.

## 2012-12-23 NOTE — BH Assessment (Signed)
Nanine Means, NP called and said Assist was unable to resolve the tele-psych problem and she will see Pt face-to-face later this morning. Notified Roanna Epley, RN of situation.  Harlin Rain Ria Comment, [facility] Triage Specialist
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
## 2012-12-23 NOTE — ED Notes (Signed)
By the time pt calmed down enough to have BP retaken she fell asleep. Due to reports of her being awake several days and behavior at [facility] warranting [employer] to be called we aren't waking pt up to retake BP at this time. Will mention to EDP, awaiting a call back from him now.

## 2012-12-23 NOTE — ED Notes (Signed)
Has been up and down, back and forth all day long. Will not stay in her room. Requires constant redirection.

## 2012-12-23 NOTE — Progress Notes (Signed)
RN from [facility] called stating he needed a UA, drug screen and pregnancy test when results are back.  Spoke with Archie Patten, RN at [facility] on 12/23/12 pt refused tests.  RN would pass on to next shift those needed to be faxed in to put pt on wait list.   Blain Pais, MHT/NS

## 2012-12-23 NOTE — ED Notes (Signed)
Marcelino Duster called from [facility] asking for the IVC and 1st opinion to get pt on [facility] wait list. Told her EDP has to fill out and will call [phone] and fax [phone] asap.

## 2012-12-23 NOTE — BH Assessment (Signed)
Per Dewayne Hatch at [facility], Pt has been accepted to wait list.  Pamalee Leyden, [facility], [facility] Triage Specialist

## 2012-12-23 NOTE — Progress Notes (Signed)
Follow- up calls were placed to the following facilities regarding inpt treatment:  [facility]- per Lanora Manis no beds available [facility]- per Trinna Post no beds available at this time; possible d/c's tomorrow [facility]- per Joyce Gross referral was not received but can refax for review [facility]- Per Belenda Cruise asked if information could be resent, have not received referral [facility]- left message @ [facility]- currently being review, will call TTS with decision [facility]- per Bonita Quin at capacity, may have d/c's tomorrow [facility]-  Per Thayer Ohm pt has been declined d/t aggression  Tomi Bamberger, MHT
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
## 2012-12-23 NOTE — Progress Notes (Signed)
Underwriter contacted Stephen,clinican at [facility] and got authorization [id number] for [facility] referral.  This authorization is good until 12/30/12.  [facility] referral was faxed to add pt on wait list.  Blain Pais, MHT/NS
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
## 2012-12-23 NOTE — Progress Notes (Signed)
Underwriter initiated bed placement for the pt by contacting the following facilities: 1)[facility]-no beds 2)[facility]- faxed referral 3)[facility]-faxed referral [facility]-no beds 5)[facility]- no beds 6)[facility]- no beds 7)[facility]-no beds 8)[facility]-faxed referral 9)[facility]-no beds 10)[facility] beds 11)[facility]called about open beds in the AM 12)[facility]-no beds 13)[facility]-faxed referral 14)[facility]-faxed referral 15)[facility]-faxed referral [facility]-no beds 17)[facility]-out of encatchment area 18)[facility]-faxed referral 19)[facility]-faxed referral.  Blain Pais, MHT/NS

## 2012-12-23 NOTE — BH Assessment (Signed)
Spoke to Nanine Means, NP who is on-call for tele-psych consults. She reports she is having difficulty connecting to the tele-consult equipment at [facility] and she has called Assist and requested a critical ticket. She will call back when she has an update on the problem. Notified Ardelia Mems, RN of delay.  Harlin Rain Ria Comment, [facility] Triage Specialist

## 2012-12-23 NOTE — Consult Note (Signed)
[facility] Face-to-Face Psychiatry Consult   Reason for Consult:  Psychosis Referring Physician:  EDP  [name] is an 37 y.o. female.  Assessment: AXIS I:  Bipolar, Manic AXIS II:  Deferred AXIS III:   Past Medical History  Diagnosis Date  . Bipolar affective   . OCD (obsessive compulsive disorder)   . Anxiety    AXIS IV:  other psychosocial or environmental problems, problems related to social environment and problems with primary support group AXIS V:  1-10 persistent dangerousness to self and others present  Plan:  Recommend psychiatric Inpatient admission when medically cleared.  Subjective:   [name] is a 37 y.o. female patient admitted with bizarre behavior.Marland Kitchen  HPI:  Patient seen chart reviewed.  Patient is 37 year old African American female who was brought in by her brother as patient was acting bizarre.  She was initially as a walk-in at [facility] however during conversation she pulled her chair up close to the assessment counselor that she has to be asked to move back.  She was banging the floor with her chair.  She was IVC and referred to emergency room.  In the emergency room patient was very loud, cursing yelling and threatening to kill her mother.  She was last discharged from behavioral Center in July however as per family noncompliance with medication.  Patient is very delusional, psychotic and grandiose.  She is using profanity against her mother.  She also endorsed hearing voices, feeling people are reading her mind and appears responding to internal stimuli.  Her thought process is very disorganized illogical and she remains very impulsive.  In the emergency room she was given Haldol Ativan and Benadryl to calm her down.  As per chart, family reported patient is not taking her medications soon after release from [facility].  She smoking pot.  Her UDS is pending.  Patient has extensive history of psychosis, bipolar disorder and multiple hospitalization.   Patient will require inpatient psychiatric services. HPI Elements:   Location:  Emergency room. Quality:  Poor. Severity:  Severe.  Past Psychiatric History: Past Medical History  Diagnosis Date  . Bipolar affective   . OCD (obsessive compulsive disorder)   . Anxiety     reports that she has been smoking Cigarettes.  She has been smoking about 1.00 pack per day. She does not have any smokeless tobacco history on file. She reports that she drinks alcohol. She reports that she uses illicit drugs (Marijuana). Family History  Problem Relation Age of Onset  . Mental illness Neg Hx            Allergies:  No Known Allergies  ACT Assessment Complete:  Yes:    Educational Status    Risk to Self: Risk to self Substance abuse history and/or treatment for substance abuse?: Yes (admits smoking weed)  Risk to Others:    Abuse:    Prior Inpatient Therapy:    Prior Outpatient Therapy:    Additional Information:                    Objective: Blood pressure 125/86, pulse 118, temperature 98.7 F (37.1 C), temperature source Oral, resp. rate 22, height 5\' 3"  (1.6 m), weight 180 lb (81.647 kg), last menstrual period 12/23/2012, SpO2 100.00%.Body mass index is 31.89 kg/(m^2). Results for orders placed during the hospital encounter of 12/23/12 (from the past 72 hour(s))  ACETAMINOPHEN LEVEL     Status: None   Collection Time    12/23/12  12:55 AM      Result Value Range   Acetaminophen (Tylenol), Serum <15.0  10 - 30 ug/mL   Comment:            THERAPEUTIC CONCENTRATIONS VARY     SIGNIFICANTLY. A RANGE OF 10-30     ug/mL MAY BE AN EFFECTIVE     CONCENTRATION FOR MANY PATIENTS.     HOWEVER, SOME ARE BEST TREATED     AT CONCENTRATIONS OUTSIDE THIS     RANGE.     ACETAMINOPHEN CONCENTRATIONS     >150 ug/mL AT 4 HOURS AFTER     INGESTION AND >50 ug/mL AT 12     HOURS AFTER INGESTION ARE     OFTEN ASSOCIATED WITH TOXIC     REACTIONS.  CBC     Status: None   Collection Time     12/23/12 12:55 AM      Result Value Range   WBC 8.8  4.0 - 10.5 K/uL   RBC 4.39  3.87 - 5.11 MIL/uL   Hemoglobin 12.9  12.0 - 15.0 g/dL   HCT 40.9  81.1 - 91.4 %   MCV 85.0  78.0 - 100.0 fL   MCH 29.4  26.0 - 34.0 pg   MCHC 34.6  30.0 - 36.0 g/dL   RDW 78.2  95.6 - 21.3 %   Platelets 359  150 - 400 K/uL  COMPREHENSIVE METABOLIC PANEL     Status: Abnormal   Collection Time    12/23/12 12:55 AM      Result Value Range   Sodium 137  135 - 145 mEq/L   Potassium 3.9  3.5 - 5.1 mEq/L   Chloride 103  96 - 112 mEq/L   CO2 21  19 - 32 mEq/L   Glucose, Bld 138 (*) 70 - 99 mg/dL   BUN 17  6 - 23 mg/dL   Creatinine, Ser 0.86 (*) 0.50 - 1.10 mg/dL   Calcium 57.8  8.4 - 46.9 mg/dL   Total Protein 8.8 (*) 6.0 - 8.3 g/dL   Albumin 4.6  3.5 - 5.2 g/dL   AST 31  0 - 37 U/L   ALT 40 (*) 0 - 35 U/L   Alkaline Phosphatase 56  39 - 117 U/L   Total Bilirubin 0.3  0.3 - 1.2 mg/dL   GFR calc non Af Amer 51 (*) >90 mL/min   GFR calc Af Amer 59 (*) >90 mL/min   Comment: (NOTE)     The eGFR has been calculated using the CKD EPI equation.     This calculation has not been validated in all clinical situations.     eGFR's persistently <90 mL/min signify possible Chronic Kidney     Disease.  ETHANOL     Status: None   Collection Time    12/23/12 12:55 AM      Result Value Range   Alcohol, Ethyl (B) <11  0 - 11 mg/dL   Comment:            LOWEST DETECTABLE LIMIT FOR     SERUM ALCOHOL IS 11 mg/dL     FOR MEDICAL PURPOSES ONLY  SALICYLATE LEVEL     Status: Abnormal   Collection Time    12/23/12 12:55 AM      Result Value Range   Salicylate Lvl <2.0 (*) 2.8 - 20.0 mg/dL   Labs are reviewed and are pertinent for high creatinine and ALT.  Current Facility-Administered Medications  Medication Dose Route  Frequency Provider Last Rate Last Dose  . acetaminophen (TYLENOL) tablet 650 mg  650 mg Oral Q4H PRN Antony Madura, PA-C      . alum & mag hydroxide-simeth (MAALOX/MYLANTA) 200-200-20 MG/5ML  suspension 30 mL  30 mL Oral PRN Antony Madura, PA-C      . ARIPiprazole (ABILIFY) tablet 10 mg  10 mg Oral BID Verne Spurr, PA-C   10 mg at 12/23/12 0228  . ibuprofen (ADVIL,MOTRIN) tablet 600 mg  600 mg Oral Q8H PRN Antony Madura, PA-C      . LORazepam (ATIVAN) tablet 1 mg  1 mg Oral Q8H PRN Antony Madura, PA-C   1 mg at 12/23/12 0229  . nicotine (NICODERM CQ - dosed in mg/24 hours) patch 21 mg  21 mg Transdermal Daily Antony Madura, PA-C      . ondansetron [facility]) tablet 4 mg  4 mg Oral Q8H PRN Antony Madura, PA-C      . zolpidem (AMBIEN) tablet 5 mg  5 mg Oral QHS PRN Antony Madura, PA-C   5 mg at 12/23/12 0229   Current Outpatient Prescriptions  Medication Sig Dispense Refill  . ARIPiprazole (ABILIFY) 10 MG tablet Take 1 tablet (10 mg total) by mouth daily. For mood stability.  30 tablet  0  . traZODone (DESYREL) 50 MG tablet Take 1 tablet (50 mg total) by mouth at bedtime and may repeat dose one time if needed. For sleep.  60 tablet  0  . trihexyphenidyl (ARTANE) 2 MG tablet Take 1 tablet (2 mg total) by mouth daily.  30 tablet  0  . ARIPiprazole (ABILIFY MAINTENA) 400 MG SUSR Inject 400 mg into the muscle every 28 (twenty-eight) days.  1 each      Psychiatric Specialty Exam:     Blood pressure 125/86, pulse 118, temperature 98.7 F (37.1 C), temperature source Oral, resp. rate 22, height 5\' 3"  (1.6 m), weight 180 lb (81.647 kg), last menstrual period 12/23/2012, SpO2 100.00%.Body mass index is 31.89 kg/(m^2).  General Appearance: Bizarre and Guarded  Eye Contact::  Poor  Speech:  Pressured  Volume:  Increased  Mood:  Angry and Irritable  Affect:  Labile  Thought Process:  Circumstantial, Disorganized, Irrelevant, Loose and Tangential  Orientation:  Full (Time, Place, and Person)  Thought Content:  Delusions, Hallucinations: Auditory, Paranoid Ideation and Rumination  Suicidal Thoughts:  No  Homicidal Thoughts:  Yes.  with intent/plan  Memory:  Negative  Judgement:  Poor  Insight:   Lacking  Psychomotor Activity:  Increased  Concentration:  Poor  Recall:  Fair  Akathisia:  Yes  Handed:  Right  AIMS (if indicated):     Assets:  Housing  Sleep:      Treatment Plan Summary: Medication management We will start her antipsychotic medication.  Recommend inpatient psychiatric services for stabilization and treatment.  Kingdavid Leinbach T. 12/23/2012 10:19 AM

## 2012-12-23 NOTE — Progress Notes (Signed)
Informed RN Ardelia Mems that [facility] needed UA, pregnancy test, and UDS for evaluation. At this time patient refusing to give urine specimen. Rosey Bath, RN

## 2012-12-23 NOTE — BH Assessment (Signed)
[facility] Assessment Progress Note     Spoke with Marcelino Duster at [facility]; she will begin to work on the disposition for the patient.   Shon Baton, MSW, LCSW, LCASA, CSW-G

## 2012-12-24 ENCOUNTER — Encounter (HOSPITAL_COMMUNITY): Payer: Self-pay

## 2012-12-24 ENCOUNTER — Inpatient Hospital Stay (HOSPITAL_COMMUNITY)
Admission: AD | Admit: 2012-12-24 | Discharge: 2012-12-31 | DRG: 885 | Disposition: A | Discharge status: Home / Self Care | Payer: Medicare Other | Source: Intra-hospital | Attending: Psychiatry | Admitting: Psychiatry

## 2012-12-24 DIAGNOSIS — F29 Unspecified psychosis not due to a substance or known physiological condition: Secondary | ICD-10-CM

## 2012-12-24 DIAGNOSIS — F259 Schizoaffective disorder, unspecified: Secondary | ICD-10-CM

## 2012-12-24 DIAGNOSIS — F1210 Cannabis abuse, uncomplicated: Secondary | ICD-10-CM

## 2012-12-24 MED ORDER — LORAZEPAM 2 MG/ML IJ SOLN
2.0000 mg | Freq: Once | INTRAMUSCULAR | Status: AC
  Administered 2012-12-24: 2 mg via INTRAMUSCULAR
  Filled 2012-12-24: qty 1

## 2012-12-24 MED ORDER — ACETAMINOPHEN 325 MG PO TABS: 650.0000 mg | ORAL_TABLET | Freq: Four times a day (QID) | ORAL | Status: DC | PRN

## 2012-12-24 MED ORDER — DIPHENHYDRAMINE HCL 50 MG/ML IJ SOLN
50.0000 mg | Freq: Once | INTRAMUSCULAR | Status: AC
  Administered 2012-12-24: 50 mg via INTRAMUSCULAR
  Filled 2012-12-24: qty 1

## 2012-12-24 MED ORDER — RISPERIDONE 1 MG PO TABS
1.0000 mg | ORAL_TABLET | Freq: Two times a day (BID) | ORAL | Status: DC
  Administered 2012-12-24: 1 mg via ORAL
  Filled 2012-12-24: qty 1

## 2012-12-24 MED ORDER — ALUM & MAG HYDROXIDE-SIMETH 200-200-20 MG/5ML PO SUSP: 30.0000 mL | ORAL | Status: DC | PRN

## 2012-12-24 MED ORDER — HYDROXYZINE HCL 50 MG PO TABS
50.0000 mg | ORAL_TABLET | Freq: Every evening | ORAL | Status: DC | PRN
  Administered 2012-12-24: 50 mg via ORAL
  Filled 2012-12-24 (×2): qty 1

## 2012-12-24 MED ORDER — TRAZODONE HCL 50 MG PO TABS: 50.0000 mg | ORAL_TABLET | Freq: Every evening | ORAL | Status: DC | PRN

## 2012-12-24 MED ORDER — ARIPIPRAZOLE ER 400 MG IM SUSR
400.0000 mg | INTRAMUSCULAR | Status: DC
  Filled 2012-12-24: qty 400

## 2012-12-24 MED ORDER — LORAZEPAM 1 MG PO TABS
1.0000 mg | ORAL_TABLET | Freq: Once | ORAL | Status: AC
  Administered 2012-12-24: 1 mg via ORAL
  Filled 2012-12-24: qty 1

## 2012-12-24 MED ORDER — BENZTROPINE MESYLATE 1 MG PO TABS: 1.0000 mg | ORAL_TABLET | Freq: Every day | ORAL | Status: DC

## 2012-12-24 MED ORDER — MAGNESIUM HYDROXIDE 400 MG/5ML PO SUSP: 30.0000 mL | Freq: Every day | ORAL | Status: DC | PRN

## 2012-12-24 NOTE — Progress Notes (Signed)
[name], female   DOB: Jul 06, 1975, 37 y.o.   MRN: [id number] Patient admitted to [facility].  Patient was brought to [facility] by her bother.  Patient was acting bizarre.  She was initially a walk in at [facility] and acted out in the assessment room.  Patient was IVCd and referred to ED.  In the ER, patient was loud and threatening to kill her mother.  She also reported that she was hearing voices.  Patient was discharged from [facility] in July and stopped taking her medications.  Patient does live with a roommate.  She denies any SI/HI.  She does appear to be responding to internal stimuli.  Patient was oriented to the unit and given nutrition.

## 2012-12-24 NOTE — BH Assessment (Signed)
Per Marylene Land at [facility], pt is on their wait list.  Evette Cristal, [facility] Assessment Counselor

## 2012-12-24 NOTE — Progress Notes (Signed)
[name], female   DOB: 04-15-1975, 37 y.o.   MRN: [id number] Patient Identification:  [name] Date of Evaluation:  12/24/2012   History of Present Illness:  Patient is here with [place] for psychosis and agitation.  Patient have not been compliant with her Abilify.   We will change her Abilify to Risperdal due to cost and medication is not effective this time.  We will try patient on Risperdal and plan to give her Risperdal injection by tomorrow afternoon.  Patient is still Psychotic and wandering about drowsy, she is homicidal towards her mother.  And she is having auditory hallucinations but is unable to say what the voices are saying..   She is easily redirectable.  Past Psychiatric History: Schizoaffective d/o   Past Medical History:     Past Medical History  Diagnosis Date  . Bipolar affective   . OCD (obsessive compulsive disorder)   . Anxiety       History reviewed. No pertinent past surgical history.  Allergies: No Known Allergies  Current Medications:  Prior to Admission medications   Medication Sig Start Date End Date Taking? Authorizing Provider  ARIPiprazole (ABILIFY) 10 MG tablet Take 1 tablet (10 mg total) by mouth daily. For mood stability. 10/08/12  Yes Fransisca Kaufmann, NP  traZODone (DESYREL) 50 MG tablet Take 1 tablet (50 mg total) by mouth at bedtime and may repeat dose one time if needed. For sleep. 10/08/12  Yes Fransisca Kaufmann, NP  trihexyphenidyl (ARTANE) 2 MG tablet Take 1 tablet (2 mg total) by mouth daily. 10/08/12  Yes Fransisca Kaufmann, NP  ARIPiprazole (ABILIFY MAINTENA) 400 MG SUSR Inject 400 mg into the muscle every 28 (twenty-eight) days. 10/08/12   Fransisca Kaufmann, NP    Social History:    reports that she has been smoking Cigarettes.  She has been smoking about 1.00 pack per day. She does not have any smokeless tobacco history on file. She reports that she drinks alcohol. She reports that she uses illicit drugs (Marijuana).   Family History:     Family History  Problem Relation Age of Onset  . Mental illness Neg Hx     Mental Status Examination/Evaluation: Unable to perform at this time.  Patient is not able to answer much questions or participate.  Patient is seen walking the hallway trying to get into peoples room drowsy but refused to sleep.   DIAGNOSIS:   AXIS I   Psychosis, Schizoaffective d/o  AXIS II  Deffered  AXIS III See medical notes.  AXIS IV housing problems, other psychosocial or environmental problems, problems related to social environment and problems with primary support group  AXIS V 21-30 behavior considerably influenced by delusions or hallucinations OR serious impairment in judgment, communication OR inability to function in almost all areas     Assessment/Plan: Consult and face to face interview with Dr Lolly Mustache We will  Switch patient from Abilify to Risperdal tablet 1 mg po bid We plan to administer Risperdal injection in the next 24 hours if patient tolerates po Risperdal We will continue to seek admission to our 400 hall unit when bed is available We will also seek placement at other facilities with available beds. Dahlia Byes  PMHNP-BC   I have personally seen the patient and agreed with the findings and involved in the treatment plan. Kathryne Sharper, MD

## 2012-12-24 NOTE — BH Assessment (Addendum)
Myriam Jacobson at [facility] - may have one d/c later today so can fax referral.  Writer faxed referral at 1:10 PM. Jeanine Luz - no beds available and she has pts in her ED she is trying to place.  Education officer, environmental for [employer] at [place] re: available beds and if Clinical research associate could fax referral. [facility] Toniann Fail - intake coordinator at lunch. Toniann Fail not sure if they received TTS referral.   Kathlene November at [facility] states pt is on their wait list, however there are no discharges today. HH will call TTS when beds becomes available.  Christiane Ha at [facility] sts he doesn't have pt's referral. Writer faxed referral at 12:20.  Evette Cristal, [place] Assessment Counselor

## 2012-12-24 NOTE — ED Provider Notes (Signed)
Medical screening examination/treatment/procedure(s) were performed by non-physician practitioner and as supervising physician I was immediately available for consultation/collaboration.   Louanna Vanliew, MD 12/24/12 0609 

## 2012-12-24 NOTE — ED Provider Notes (Signed)
4:32 PM Pt accepted to [facility], Dr. Jannifer Franklin. I was not directly involved in pt's care or disposition planning.  1. Bipolar disorder, manic, moderate      Shanna Cisco, MD 12/24/12 1946

## 2012-12-24 NOTE — Tx Team (Signed)
Initial Interdisciplinary Treatment Plan  PATIENT STRENGTHS: (choose at least two) General fund of knowledge  PATIENT STRESSORS: Marital or family conflict Medication change or noncompliance   PROBLEM LIST: Problem List/Patient Goals Date to be addressed Date deferred Reason deferred Estimated date of resolution  Risk for suicide 12/24/12     depression 12/24/12     HI 12/24/12                                          DISCHARGE CRITERIA:  Improved stabilization in mood, thinking, and/or behavior Motivation to continue treatment in a less acute level of care  PRELIMINARY DISCHARGE PLAN: Attend aftercare/continuing care group Outpatient therapy  PATIENT/FAMIILY INVOLVEMENT: This treatment plan has been presented to and reviewed with the patient, [name].  The patient and family have been given the opportunity to ask questions and make suggestions.  Jacques Navy A 12/24/2012, 7:04 PM

## 2012-12-24 NOTE — Progress Notes (Signed)
Patient confirms she does not have a pcp but does have Medicare insurance.  [facility] informed patient that there will be a list of Medicare accepting physicians within five to ten miles of her zip code on the front of her chart to help her find a pcp.  Patient verbalized understanding.  Psych ED RN aware.

## 2012-12-24 NOTE — Consult Note (Signed)
  Appraoched by Nursing-patient remains manic and hypersexual -Geodon 20 mg cocktail(Ativan 2 mg Benadryl 50 mg) 7 hours ago.Also on oral abilify .artane 2 mg ativan 1 mg.. Records indicate she was on monthly injections of Abilify (sustenna) last dose non compliant/not given.Last recorded dose was August 4. . No Record of lithium rx. She has gotten ambien 5 mg which probably not a good drug for her given the type of mental side effects it can have.Will stop the ambien and add trazodone HS tomorrow Restart her Lorelei Pont and give her 2 mg of Ativan IM now. She may have additional benadryl prn if nursing feels she needs it.Reassess her meds on day shift.Would not give her anymore Geodon.  Addendum: Pharmacy noted Abilify injection on order for 10 AM ?Not sure why it was delayed but will not insist she get it now. Will add benadryl to her Ativan for now.Day shift can reassess.

## 2012-12-24 NOTE — BHH Counselor (Signed)
Pt accepted to [facility], Dr. Jannifer Franklin, bed 403-2. Pt is IVC and has signed the ROI. Document faxed to [facility]. Ranae Pila, LCAS, ICAADC 12/24/2012 4:00 PM

## 2012-12-24 NOTE — Progress Notes (Addendum)
[name], female   DOB: Apr 09, 1975, 37 y.o.   MRN: [id number] D: pt. Slept most of the evening, reports she got in about 2 am and haven't had any sleep.Pt. Reports she was admitted because she got upset with mom "I was going to hurt her" no SHI at this time. "I'm going to keep smoking marijuana"  A: Writer introduced self to client and encouraged her to get up for snacks and to interact a little. Writer encourage client to stop the marijuana and take medication instead.  and Staff will monitor q63min for safety. R: Pt. Up as requested, pt. Did not attend group. Pt. Is safe on the unit.

## 2012-12-25 DIAGNOSIS — F1210 Cannabis abuse, uncomplicated: Secondary | ICD-10-CM

## 2012-12-25 DIAGNOSIS — F1220 Cannabis dependence, uncomplicated: Secondary | ICD-10-CM

## 2012-12-25 LAB — URINE CULTURE: Colony Count: 30000

## 2012-12-25 LAB — TSH: TSH: 1.565 u[IU]/mL (ref 0.350–4.500)

## 2012-12-25 LAB — GLUCOSE, CAPILLARY: Glucose-Capillary: 129 mg/dL — ABNORMAL HIGH (ref 70–99)

## 2012-12-25 MED ORDER — BENZTROPINE MESYLATE 1 MG PO TABS
1.0000 mg | ORAL_TABLET | Freq: Every day | ORAL | Status: DC
  Administered 2012-12-25 – 2012-12-30 (×6): 1 mg via ORAL
  Filled 2012-12-25 (×3): qty 1
  Filled 2012-12-25: qty 3
  Filled 2012-12-25 (×4): qty 1

## 2012-12-25 MED ORDER — TRAZODONE HCL 50 MG PO TABS
50.0000 mg | ORAL_TABLET | Freq: Every evening | ORAL | Status: DC | PRN
  Administered 2012-12-25: 50 mg via ORAL
  Filled 2012-12-25: qty 3
  Filled 2012-12-25 (×2): qty 1

## 2012-12-25 MED ORDER — OLANZAPINE 10 MG PO TBDP
10.0000 mg | ORAL_TABLET | Freq: Three times a day (TID) | ORAL | Status: DC | PRN
  Filled 2012-12-25: qty 1

## 2012-12-25 MED ORDER — ARIPIPRAZOLE 10 MG PO TABS
10.0000 mg | ORAL_TABLET | Freq: Every day | ORAL | Status: DC
  Administered 2012-12-25 – 2012-12-30 (×6): 10 mg via ORAL
  Filled 2012-12-25 (×7): qty 1
  Filled 2012-12-25: qty 3

## 2012-12-25 NOTE — BHH Suicide Risk Assessment (Signed)
Suicide Risk Assessment  Admission Assessment     Nursing information obtained from:    Demographic factors:    Current Mental Status:    Loss Factors:    Historical Factors:    Risk Reduction Factors:     CLINICAL FACTORS:   Severe Anxiety and/or Agitation Bipolar Disorder:   Depressive phase Depression:   Aggression Anhedonia Comorbid alcohol abuse/dependence Delusional Hopelessness Impulsivity Insomnia Alcohol/Substance Abuse/Dependencies Schizophrenia:   Command hallucinatons Paranoid or undifferentiated type Currently Psychotic Unstable or Poor Therapeutic Relationship  COGNITIVE FEATURES THAT CONTRIBUTE TO RISK:  Closed-mindedness Polarized thinking    SUICIDE RISK:   Minimal: No identifiable suicidal ideation.  Patients presenting with no risk factors but with morbid ruminations; may be classified as minimal risk based on the severity of the depressive symptoms  PLAN OF CARE:1. Admit for crisis management and stabilization. 2. Medication management to reduce current symptoms to base line and improve the     patient's overall level of functioning 3. Treat health problems as indicated. 4. Develop treatment plan to decrease risk of relapse upon discharge and the need for     readmission. 5. Psycho-social education regarding relapse prevention and self care. 6. Health care follow up as needed for medical problems. 7. Restart home medications where appropriate.   I certify that inpatient services furnished can reasonably be expected to improve the patient's condition.  Thedore Mins, MD 12/25/2012, 11:18 AM

## 2012-12-25 NOTE — H&P (Signed)
Psychiatric Admission Assessment Adult  Patient Identification:  [name] Date of Evaluation:  12/25/2012 Chief Complaint:  BIPOLAR DISORDER  History of Present Illness:  This is a 37 year old female with a history of Bipolar Disorder who was last a patient at [facility] in July of this year. The patient was brought to [facility] under by her brother due to bizarre behaviors. Her brother reports that the patient has not been compliant with medications but rather smoking marijuana. Per the tele assessment note from 12/23/12 the patient acted very psychotic demonstrating intrusive behaviors and was observed responding to internal stimuli. The patient was petitioned for IVC by the [facility] as she escalated during the initial assessment and was continually making verbal threats towards her mother. The patient shows no insight into her symptoms today upon assessment. The patient is observed resting in bed with the covers over her head. She minimizes all events recorded in the chart stating "None of that is true. I was just angry at my mother and that's just the way I was feeling. That day I had been to the store to get steaks and everything was fine. I don't know why my mother called my brother to take me to the hospital. I am still hearing voices telling me to just kill my mother. But I don't really think I would ever act on that." Patient became mildly agitated during routine admission questions. Of note on her previous admission in July patient reported the same story that her mother had committed her for no reason. Patient maintains that she has been compliant with medications but information in the chart contradicts this. She states "I took the oral abilify but I could not afford the long acting injectable."   Elements:  Location:  [facility] in-patient. Quality:  Mood lability, medication noncompliance. Severity:  Conflict with family, SI. Timing:  Last few weeks. Duration:  Chronic. Context:  Manic behaviors,  medication noncompliance. Associated Signs/Synptoms: Depression Symptoms:  depressed mood, anhedonia, feelings of worthlessness/guilt, hopelessness, suicidal thoughts without plan, disturbed sleep, (Hypo) Manic Symptoms:  Delusions, Hallucinations, Irritable Mood, Labiality of Mood, Anxiety Symptoms:  Excessive Worry, Psychotic Symptoms:  Auditory Hallucinations  PTSD Symptoms: Denies  Psychiatric Specialty Exam: Physical Exam  -Findings from the ED reviewed and I concur with no exceptions.   Review of Systems  Constitutional: Negative.   HENT: Negative.   Eyes: Negative.   Respiratory: Negative.   Cardiovascular: Negative.   Gastrointestinal: Negative.   Genitourinary: Negative.   Musculoskeletal: Negative.   Skin: Negative.   Neurological: Negative.   Endo/Heme/Allergies: Negative.   Psychiatric/Behavioral: Positive for depression, suicidal ideas, hallucinations and substance abuse. Negative for memory loss. The patient is not nervous/anxious and does not have insomnia.     Blood pressure 122/90, pulse 101, temperature 97.6 F (36.4 C), temperature source Oral, resp. rate 18, height 5\' 3"  (1.6 m), weight 88.451 kg (195 lb), last menstrual period 12/23/2012.Body mass index is 34.55 kg/(m^2).  General Appearance: Casual and Disheveled  Eye Contact::  Good  Speech:  Clear and Coherent  Volume:  Normal  Mood:  Depressed and Dysphoric  Affect:  Flat  Thought Process:  Circumstantial  Orientation:  Full (Time, Place, and Person)  Thought Content:  Delusions and Paranoid Ideation  Suicidal Thoughts:  Denies  Homicidal Thoughts:  Yes, toward mother  Memory:  Immediate;   Good Recent;   Good Remote;   Good  Judgement:  Poor  Insight:  Shallow  Psychomotor Activity:  Normal  Concentration:  Fair  Recall:  Good  Akathisia:  No  Handed:  Left  AIMS (if indicated):     Assets:  Communication Skills Desire for Improvement Housing Intimacy Leisure Time Physical  Health Resilience Social Support  Sleep:  Number of Hours: 5.25    Past Psychiatric History:Yes Diagnosis: Schizoaffective  Hospitalizations:Multiple at [facility], also [facility], [place]  Outpatient Care: [facility]  Substance Abuse Care:Denies  Self-Mutilation:Denies  Suicidal Attempts:Denies  Violent Behaviors:Denies   Past Medical History:   Past Medical History  Diagnosis Date  . Bipolar affective   . OCD (obsessive compulsive disorder)   . Anxiety    None. Allergies:  No Known Allergies PTA Medications: Prescriptions prior to admission  Medication Sig Dispense Refill  . traZODone (DESYREL) 50 MG tablet Take 1 tablet (50 mg total) by mouth at bedtime and may repeat dose one time if needed. For sleep.  60 tablet  0  . trihexyphenidyl (ARTANE) 2 MG tablet Take 1 tablet (2 mg total) by mouth daily.  30 tablet  0    Previous Psychotropic Medications:  Medication/Dose  Depakote-"My brain gets to heavy"  Abilify              Substance Abuse History in the last 12 months:  yes  Consequences of Substance Abuse: Patient has been abusing marijuana which as resulted in psychotic symptoms leading to Involuntary hospital admission.   Social History:  reports that she has been smoking Cigarettes.  She has been smoking about 0.25 packs per day. She does not have any smokeless tobacco history on file. She reports that she drinks alcohol. She reports that she uses illicit drugs (Marijuana). Additional Social History:                      Current Place of Residence: [place], [place] Place of Birth:  [place], [place]  Family Members: Marital Status:  Single Children: None  Sons:  Daughters: Relationships: Education:  Corporate treasurer Problems/Performance: Religious Beliefs/Practices: History of Abuse (Emotional/Phsycial/Sexual)-Denies Teacher, music History:  None. Legal History: Denies Hobbies/Interests:  Family History:    Family History  Problem Relation Age of Onset  . Mental illness Neg Hx     Results for orders placed during the hospital encounter of 12/24/12 (from the past 72 hour(s))  TSH     Status: None   Collection Time    12/24/12  6:50 PM      Result Value Range   TSH 1.565  0.350 - 4.500 uIU/mL   Comment: Performed at [facility]   Psychological Evaluations:  Assessment:   AXIS I:  Schizoaffective, unspecified condition, Cannabis Dependent AXIS II:  Deferred AXIS III:   Past Medical History  Diagnosis Date  . Bipolar affective   . OCD (obsessive compulsive disorder)   . Anxiety    AXIS IV:  economic problems, educational problems, housing problems, occupational problems and other psychosocial or environmental problems AXIS V:  41-50 serious symptoms   Treatment Plan/Recommendations:   1. Admit for crisis management and stabilization. Estimated length of stay 5-7 days. 2. Medication management to reduce current symptoms to base line and improve the patient's level of functioning. Continue on Abilify 10 mg for mood stability and order Cogentin 1 mg at hs for prevention of side effects. Trazodone 50 mg hs prn initiated to help improve sleep. 3. Develop treatment plan to decrease risk of relapse upon discharge of depressive symptoms and the need for readmission. 5. Group  therapy to facilitate development of healthy coping skills to use for depressive and manic symptoms.  6. Health care follow up as needed for medical problems.  7. Discharge plan to include therapy to help patient cope with stressor of chronic mental illness.  8. Call for Consult with Hospitalist for additional specialty patient services as needed.   Treatment Plan Summary: Daily contact with patient to assess and evaluate symptoms and progress in treatment Medication management Current Medications:  Current Facility-Administered Medications  Medication Dose Route Frequency Provider Last Rate Last Dose  .  acetaminophen (TYLENOL) tablet 650 mg  650 mg Oral Q6H PRN Earney Navy, NP      . alum & mag hydroxide-simeth (MAALOX/MYLANTA) 200-200-20 MG/5ML suspension 30 mL  30 mL Oral Q4H PRN Earney Navy, NP      . hydrOXYzine (ATARAX/VISTARIL) tablet 50 mg  50 mg Oral QHS PRN Earney Navy, NP   50 mg at 12/24/12 2140  . magnesium hydroxide (MILK OF MAGNESIA) suspension 30 mL  30 mL Oral Daily PRN Earney Navy, NP        Observation Level/Precautions:  15 minute checks  Laboratory:  CBC Chemistry Profile UDS UA TSH  Psychotherapy:  Group Sessions  Medications:  See list  Consultations:  As needed  Discharge Concerns:  Safety, Stability, and Medication Compliance  Estimated LOS: 5-7 days  Other:  Obtain collateral information from mother.    I certify that inpatient services furnished can reasonably be expected to improve the patient's condition.   DAVIS, LAURA NP-C  10/21/20149:39 AM   Seen and agreed. Thedore Mins, MD

## 2012-12-25 NOTE — BHH Counselor (Signed)
Adult Psychosocial Assessment Update Interdisciplinary Team  Previous [facility] admissions/discharges:  Admissions Discharges  Date:10/02/12 Date:  Date:05/05/11 Date:  Date:07/19/10 Date:  Date: Date:  Date: Date:   Changes since the last Psychosocial Assessment (including adherence to outpatient mental health and/or substance abuse treatment, situational issues contributing to decompensation and/or relapse). Londan has been living in her own apartment for several years.  She has not worked in 4 years and is supported by disability.  She would like to work part-time.  She demonstrates little insight by stating there is nothing wrong with her, and her mother is getting her back by putting her here in the hospital.  She is a client at [facility], and admits to taking her medication "only when I feel like I need it."             Discharge Plan 1. Will you be returning to the same living situation after discharge?   Yes:X home No:      If no, what is your plan?           2. Would you like a referral for services when you are discharged? Yes:     If yes, for what services?  No:       Will talk to pt about the possibility of working with an ACT team       Summary and Recommendations (to be completed by the evaluator) Pami is a 37 YO [name] who is here due to disorganized thoughts.  She is often non-compliant with medications and gets mad at her mother because she believes her mother is trying to run her life.  She can benefit from crises stabilization, medication management, therapeutic milieu and referral for services.                       Signature:  Ida Rogue, 12/25/2012 5:54 PM

## 2012-12-25 NOTE — Tx Team (Signed)
  Interdisciplinary Treatment Plan Update   Date Reviewed:  12/25/2012  Time Reviewed:  8:09 AM  Progress in Treatment:   Attending groups: Yes Participating in groups: Yes Taking medication as prescribed: Yes  Tolerating medication: Yes Family/Significant other contact made: No Patient understands diagnosis: No  Limited insight  States she is here because her mother is vindictive  Discussing patient identified problems/goals with staff: Yes  See initial care plan Medical problems stabilized or resolved: Yes Denies suicidal/homicidal ideation: Yes  In tx team  States her thoughts of HI towards mother "are in dreams only.  I don't have a plan." Patient has not harmed self or others: Yes  For review of initial/current patient goals, please see plan of care.  Estimated Length of Stay:  4-5 days  Reason for Continuation of Hospitalization: Delusions  Mania Medication stabilization  New Problems/Goals identified:  N/A  Discharge Plan or Barriers:   return home, follow up [facility]  Additional Comments:  Patient was brought to [facility] by her brother. Patient was acting bizarre. She was initially a walk in at [facility] and acted out in the assessment room. Patient was IVCd and referred to ED. In the ER, patient was loud and threatening to kill her mother. She also reported that she was hearing voices. Patient was discharged from [facility] in July and stopped taking her medications. Patient does live with a roommate. She denies any SI/HI. She does appear to be responding to internal stimuli. Admits to smoking weed on an on-going basis.   Attendees:  Signature: Thedore Mins, MD 12/25/2012 8:09 AM   Signature: Richelle Ito, LCSW 12/25/2012 8:09 AM  Signature: Fransisca Kaufmann, NP 12/25/2012 8:09 AM  Signature: Joslyn Devon, RN 12/25/2012 8:09 AM  Signature: Liborio Nixon, RN 12/25/2012 8:09 AM  Signature:  12/25/2012 8:09 AM  Signature:   12/25/2012 8:09 AM  Signature:    Signature:    Signature:     Signature:    Signature:    Signature:      Scribe for Treatment Team:   Richelle Ito, LCSW  12/25/2012 8:09 AM

## 2012-12-25 NOTE — Progress Notes (Signed)
Adult Psychoeducational Group Note  Date:  12/25/2012 Time:  8:00 pm  Group Topic/Focus:  Wrap-Up Group:   The focus of this group is to help patients review their daily goal of treatment and discuss progress on daily workbooks.  Participation Level:  Active  Participation Quality:  Appropriate and Sharing  Affect:  Appropriate  Cognitive:  Appropriate  Insight: Good  Engagement in Group:  Engaged  Modes of Intervention:  Discussion, Education, Socialization and Support  Additional Comments:  Pt stated that she is in the hospital due to her Bipolar Disorder. Pt stated that she is thankful to be alive and thankful for her friends and family.   Laural Benes, Kalyna Paolella 12/25/2012, 10:27 PM

## 2012-12-25 NOTE — BHH Group Notes (Signed)
BHH LCSW Group Therapy  12/25/2012 , 12:37 PM   Type of Therapy:  Group Therapy  Participation Level:  Minimal  Participation Quality:  Attentive  Affect:  Appropriate  Cognitive:  Alert  Insight: Limited  Engagement in Therapy:  Engaged  Modes of Intervention:  Discussion, Exploration and Socialization  Summary of Progress/Problems: Today's group focused on the term Diagnosis.  Participants were asked to define the term, and then pronounce whether it is a negative, positive or neutral term.  Shevelle participated in her limited way.  Would periodically blurt out unrelated questions such as "what time is lunch served?"  However, she not overly disruptive,  and joined in a limited manner on the discussion of diagnosis.  She gave firm support to the concept of stimgatization by others towards those with mental health diagnosis-and then blamed her mother for her admission to the hospital.  Ida Rogue 12/25/2012 , 12:37 PM

## 2012-12-25 NOTE — Progress Notes (Signed)
Patient ID: [name], female   DOB: 1975-12-08, 37 y.o.   MRN: [id number]  D: Pt. Reports no SI but is positive for HI. Writer asked patient if she felt homicidal towards mother and patient stated, "she's already dead." Writer encouraged patient to elaborate on her statement and patient said, "She's not dead, I wish I could kill her." Patient also states that her mother has [place] by proxy and that is why patient is here now. Patient states that she kills her mom in her dreams. Patient continues to elaborate about how she does not care if she is alive or not and her boyfriend is mean. Patient denies A/V hallucinations and reports no pain or discomfort at this time. Patient rates her depression and hopelessness at a 1/10 today.  A: Encouragement is offered to patient by Clinical research associate to speak with Clinical research associate if patient has any concerns and support is given for patient to elaborate on her homicidal ideation.  R: Patient is direct with answers but is irritable when writer asks patient questions. Q15 minute safety checks are maintained for safety.

## 2012-12-25 NOTE — Progress Notes (Signed)
D   Pt is flat and blunted  She came to the medication window and was requesting to go home   She said she is ready to be discharged    She attended and participated in group   She said she is angry with her mother and believes she kills her mother in her mind   Pt denies voices   And she denies suicidal ideation  Her thinking is disorganized and she is suspicious at times A   Verbal support given  Medications administered and effectiveness monitored   Q 15 min checks    R   Pt safe at present

## 2012-12-26 LAB — GLUCOSE, CAPILLARY
Glucose-Capillary: 136 mg/dL — ABNORMAL HIGH (ref 70–99)
Glucose-Capillary: 124 mg/dL — ABNORMAL HIGH (ref 70–99)

## 2012-12-26 NOTE — [facility] Group Notes (Signed)
[facility] LCSW Aftercare Discharge Planning Group Note   12/26/2012 10:17 AM  Participation Quality:  Minimal   Mood/Affect:  Appropriate  Depression Rating:  Denies   Anxiety Rating:  Denies   Thoughts of Suicide:  No Will you contract for safety?   N/A  Current AVH:  No  Plan for Discharge/Comments:  Citlally indicated that she slept okay.  In regards to her mother, she stated that "Everything is fine."  However, in the notes from last night, she stated to a nurse that she thinks she is killing her mother in her mind.  Seems to be minimizing what is going on.    Transportation Means: family  Supports:  Family.    Simona Huh

## 2012-12-26 NOTE — [facility] Group Notes (Signed)
[facility] [employer] Group Therapy  12/26/2012 , 1:41 PM    Type of Therapy:  [employer] Presentation  Participation Level:  Invited, chose not to attend    Summary of Progress/Problems:  Onalee Hua from [employer] came to present his recovery story and play the guitar.    [name] 12/26/2012 , 1:41 PM

## 2012-12-26 NOTE — [facility] Suicide Risk Assessment (Signed)
[facility] INPATIENT:  Family/Significant Other Suicide Prevention Education  Suicide Prevention Education:  Education Completed; No one has been identified by the patient as the family member/significant other with whom the patient will be residing, and identified as the person(s) who will aid the patient in the event of a mental health crisis (suicidal ideations/suicide attempt).  With written consent from the patient, the family member/significant other has been provided the following suicide prevention education, prior to the and/or following the discharge of the patient.  The suicide prevention education provided includes the following:  Suicide risk factors  Suicide prevention and interventions  National Suicide Hotline telephone number  [facility] assessment telephone number  [place] Emergency Assistance 911  County and/or Residential Mobile Crisis Unit telephone number  Request made of family/significant other to:  Remove weapons (e.g., guns, rifles, knives), all items previously/currently identified as safety concern.    Remove drugs/medications (over-the-counter, prescriptions, illicit drugs), all items previously/currently identified as a safety concern.  The family member/significant other verbalizes understanding of the suicide prevention education information provided.  The family member/significant other agrees to remove the items of safety concern listed above. The patient did not endorse SI at the time of admission, nor did the patient c/o SI during the stay here.  SPE not required.   [name] 12/26/2012, 4:39 PM 

## 2012-12-26 NOTE — Progress Notes (Signed)
Recreation Therapy Notes  ate: 10.22.2014 Time: 9:30am Location: [address] Dayroom  Group Topic: Self-Esteem  Goal Area(s) Addresses:  Patient will effectively give examples of way to increase self-esteem. Patient will verbalize benefit of increased self-esteem.   Behavioral Response: Appropriate   Intervention: Informational Worksheet  Activity: Patient were given the '10 Steps to Self-Esteem' worksheet and asked to identify why each step is important, as well as given examples of how they can invest in each step. 10 Steps include: 1- Know yourself, 2- Understand what makes you feel great. 3- Recognize things that get your down. 4- Set goals to achieve what you want. 5- Develop trusting friendships that make you feel good. 6- Don't be afraid to ask for help. 7- Stand up for your beliefs and values 8- Help someone else 9- Take responsibility for your own actions 10- Take good care of yourself.   Education:  Discharge Planning, Self investment  Education Outcome: Needs additional education  Clinical Observations/Feedback: Patient attended group, but made no contributions to group discussion.   Marykay Lex Crysten Kaman, LRT/CTRS  Jearl Klinefelter 12/26/2012 12:42 PM

## 2012-12-26 NOTE — Progress Notes (Signed)
[facility] MD Progress Note  12/26/2012 11:04 AM [name]  MRN:  [id number] Subjective: "I am still upset with my mother for putting me in the hospital just because I was not taking my medication and smoking Marijuana with my roommate." Objective: Patient reports that she continues to hear voices telling her to hurt her mother but would not act on that. She reports that she is feeling less irritable an upset today and says she now know she needs to take her medications. Patient remains withdrawn, depressed but paranoid. She still refuse to take a monthly injectable offered to her due to lack of compliance with oral medications outside of the hospital. Diagnosis:   DSM5: Schizophrenia Disorders:  Delusional Disorder (297.1) and Schizophrenia (295.7) Obsessive-Compulsive Disorders:   Trauma-Stressor Disorders:   Substance/Addictive Disorders:  Cannabis Use Disorder - Severe (304.30) Depressive Disorders:  Disruptive Mood Dysregulation Disorder (296.99)  Axis I: Schizoaffective Disorder            Cannabis use disorder severe ADL's:  Intact  Sleep: Fair  Appetite:  Fair  Suicidal Ideation: denies  Homicidal Ideation: denies  AEB (as evidenced by):  Psychiatric Specialty Exam: Review of Systems  Constitutional: Negative.   HENT: Negative.   Eyes: Negative.   Respiratory: Negative.   Cardiovascular: Negative.   Gastrointestinal: Negative.   Genitourinary: Negative.   Musculoskeletal: Negative.   Skin: Negative.   Neurological: Negative.   Endo/Heme/Allergies: Negative.   Psychiatric/Behavioral: Positive for depression, hallucinations and substance abuse. The patient is nervous/anxious.     Blood pressure 104/68, pulse 120, temperature 97.6 F (36.4 C), temperature source Oral, resp. rate 18, height 5\' 3"  (1.6 m), weight 88.451 kg (195 lb), last menstrual period 12/23/2012.Body mass index is 34.55 kg/(m^2).  General Appearance: Fairly Groomed  Patent attorney::  Minimal  Speech:   Pressured  Volume:  Normal  Mood:  Dysphoric and Irritable  Affect:  Blunt  Thought Process:  Circumstantial  Orientation:  Full (Time, Place, and Person)  Thought Content:  Delusions and Hallucinations: Auditory  Suicidal Thoughts:  No  Homicidal Thoughts:  No  Memory:  Immediate;   Fair Recent;   Fair Remote;   Fair  Judgement:  Poor  Insight:  Lacking  Psychomotor Activity:  Increased  Concentration:  Fair  Recall:  Fair  Akathisia:  No  Handed:  Right  AIMS (if indicated):     Assets:  Physical Health Social Support  Sleep:  Number of Hours: 6.75   Current Medications: Current Facility-Administered Medications  Medication Dose Route Frequency Provider Last Rate Last Dose  . acetaminophen (TYLENOL) tablet 650 mg  650 mg Oral Q6H PRN Earney Navy, NP      . alum & mag hydroxide-simeth (MAALOX/MYLANTA) 200-200-20 MG/5ML suspension 30 mL  30 mL Oral Q4H PRN Earney Navy, NP      . ARIPiprazole (ABILIFY) tablet 10 mg  10 mg Oral QHS Madelene Kaatz   10 mg at 12/25/12 2028  . benztropine (COGENTIN) tablet 1 mg  1 mg Oral QHS Shemeka Wardle   1 mg at 12/25/12 2028  . hydrOXYzine (ATARAX/VISTARIL) tablet 50 mg  50 mg Oral QHS PRN Earney Navy, NP   50 mg at 12/24/12 2140  . magnesium hydroxide (MILK OF MAGNESIA) suspension 30 mL  30 mL Oral Daily PRN Earney Navy, NP      . OLANZapine zydis (ZYPREXA) disintegrating tablet 10 mg  10 mg Oral Q8H PRN Rally Ouch      .  traZODone (DESYREL) tablet 50 mg  50 mg Oral QHS PRN Niki Payment   50 mg at 12/25/12 2027    Lab Results:  Results for orders placed during the hospital encounter of 12/24/12 (from the past 48 hour(s))  TSH     Status: None   Collection Time    12/24/12  6:50 PM      Result Value Range   TSH 1.565  0.350 - 4.500 uIU/mL   Comment: Performed at [facility]  GLUCOSE, CAPILLARY     Status: Abnormal   Collection Time    12/25/12  8:30 PM      Result Value Range    Glucose-Capillary 129 (*) 70 - 99 mg/dL  GLUCOSE, CAPILLARY     Status: Abnormal   Collection Time    12/26/12  6:55 AM      Result Value Range   Glucose-Capillary 113 (*) 70 - 99 mg/dL   Comment 1 Notify RN      Physical Findings: AIMS: Facial and Oral Movements Muscles of Facial Expression: None, normal Lips and Perioral Area: None, normal Jaw: None, normal Tongue: None, normal,Extremity Movements Upper (arms, wrists, hands, fingers): None, normal Lower (legs, knees, ankles, toes): None, normal, Trunk Movements Neck, shoulders, hips: None, normal, Overall Severity Severity of abnormal movements (highest score from questions above): None, normal Incapacitation due to abnormal movements: None, normal Patient's awareness of abnormal movements (rate only patient's report): No Awareness, Dental Status Current problems with teeth and/or dentures?: No Does patient usually wear dentures?: No  CIWA:  CIWA-Ar Total: 0 COWS:  COWS Total Score: 1  Treatment Plan Summary: Daily contact with patient to assess and evaluate symptoms and progress in treatment Medication management  Plan:1. Admit for crisis management and stabilization. 2. Medication management to reduce current symptoms to base line and improve the     patient's overall level of functioning 3. Treat health problems as indicated. 4. Develop treatment plan to decrease risk of relapse upon discharge and the need for     readmission. 5. Psycho-social education regarding relapse prevention and self care. 6. Health care follow up as needed for medical problems. 7. Restart home medications where appropriate. 8. Will continue Ability 10mg  po Qhs  Medical Decision Making Problem Points:  Established problem, stable/improving (1), Review of last therapy session (1) and Review of psycho-social stressors (1) Data Points:  Order Aims Assessment (2) Review of medication regiment & side effects (2) Review of new medications or change in  dosage (2)  I certify that inpatient services furnished can reasonably be expected to improve the patient's condition.   Thedore Mins, MD 12/26/2012, 11:04 AM

## 2012-12-26 NOTE — Progress Notes (Signed)
Adult Psychoeducational Group Note  Date:  12/26/2012 Time:  11:00AM Group Topic/Focus:  Personal Choices and Values:   The focus of this group is to help patients assess and explore the importance of values in their lives, how their values affect their decisions, how they express their values and what opposes their expression.  Participation Level:  Did Not Attend   Additional Comments: Pt. Didn't attend group.   Bing Plume D 12/26/2012, 12:07 PM

## 2012-12-26 NOTE — Progress Notes (Signed)
[name], female   DOB: 1975-12-20, 37 y.o.   MRN: [id number]  D: Patient presents with blunted, depressed affect and depressed mood. However, patient is pleasant when engaging with staff in conversation. Pt. denies SI/HI and A/V hallucinations. Patient denies pain at this time. Patient stated that she has talked to her mother and apologized for the way she was acting. Patient states that she is ready to go home. When speaking with physician, patient stated that she realizes she "can't do weed anymore." Patient also reports that her mom told her roommate to cut her off from his supply that she partakes in. Pt. Reports her depression and hopelessness at 1/10 (0-10 scale) today.   A: Support and encouragement given to patient to fill out daily inventory sheet and to go to groups.  R: Patient is receptive and cooperative. Q15 minute safety checks are maintained. Pt. is safe at this time.

## 2012-12-27 LAB — HEMOGLOBIN A1C: Hgb A1c MFr Bld: 6.3 % — ABNORMAL HIGH (ref <5.7)

## 2012-12-27 NOTE — Progress Notes (Signed)
[name], female   DOB: 09-Aug-1975, 37 y.o.   MRN: [id number] D: pt. Lying in bed, eyes closed respiration even. A: Writer assessed of s/s of distress. Staff will monitor q53min for safety. R: Pt. Is safe on the unit, no distress noted.

## 2012-12-27 NOTE — [facility] Group Notes (Signed)
[facility] Group Notes:  (Counselor/Nursing/MHT/Case Management/Adjunct)  12/27/2012 1:15PM  Type of Therapy:  Group Therapy  Participation Level:  None  Participation Quality:  Appropriate  Affect:  Flat  Cognitive:  Oriented  Insight:  Limited  Engagement in Group:  Limited  Engagement in Therapy:  None  Modes of Intervention:  Discussion, Exploration and Socialization  Summary of Progress/Problems: The topic for group was balance in life.  Pt participated in the discussion about when their life was in balance and out of balance and how this feels.  Pt discussed ways to get back in balance and short term goals they can work on to get where they want to be. Vicie sat quietly for 20 minutes, then left.   [name] 12/27/2012 2:00 PM

## 2012-12-27 NOTE — Care Management Utilization Note (Signed)
   Per State Regulation 482.30  This chart was reviewed for necessity with respect to the patient's Admission/ Duration of stay.  Next review date: 12/30/12  Nicolasa Ducking RN, BSN

## 2012-12-27 NOTE — Progress Notes (Signed)
[name], female   DOB: 1975/11/29, 37 y.o.   MRN: [id number] [facility] MD Progress Note  12/27/2012 11:22 AM [name]  MRN:  [id number] Subjective: "I think I have been feeling better since I got back on my medications." Objective: Patient reports decreased, agitation, irritability, psychosis and delusional symptoms. She denies suicidal/homicidal ideation, intent or plan. She is compliant with her medications and has not endorsed any adverse reactions. Diagnosis:   DSM5: Schizophrenia Disorders:  Delusional Disorder (297.1) and Schizophrenia (295.7) Obsessive-Compulsive Disorders:   Trauma-Stressor Disorders:   Substance/Addictive Disorders:  Cannabis Use Disorder - Severe (304.30) Depressive Disorders:  Disruptive Mood Dysregulation Disorder (296.99)  Axis I: Schizoaffective Disorder            Cannabis use disorder severe ADL's:  Intact  Sleep: Fair  Appetite:  Fair  Suicidal Ideation: denies  Homicidal Ideation: denies  AEB (as evidenced by):  Psychiatric Specialty Exam: Review of Systems  Constitutional: Negative.   HENT: Negative.   Eyes: Negative.   Respiratory: Negative.   Cardiovascular: Negative.   Gastrointestinal: Negative.   Genitourinary: Negative.   Musculoskeletal: Negative.   Skin: Negative.   Neurological: Negative.   Endo/Heme/Allergies: Negative.   Psychiatric/Behavioral: Positive for depression, hallucinations and substance abuse. The patient is nervous/anxious.     Blood pressure 109/74, pulse 106, temperature 98 F (36.7 C), temperature source Oral, resp. rate 16, height 5\' 3"  (1.6 m), weight 88.451 kg (195 lb), last menstrual period 12/23/2012.Body mass index is 34.55 kg/(m^2).  General Appearance: Fairly Groomed  Patent attorney::  Minimal  Speech:  Pressured  Volume:  Normal  Mood:  Dysphoric and Irritable  Affect:  Blunt  Thought Process:  Circumstantial  Orientation:  Full (Time, Place, and Person)  Thought Content:   Delusions and Hallucinations: Auditory  Suicidal Thoughts:  No  Homicidal Thoughts:  No  Memory:  Immediate;   Fair Recent;   Fair Remote;   Fair  Judgement:  Poor  Insight:  Lacking  Psychomotor Activity:  Increased  Concentration:  Fair  Recall:  Fair  Akathisia:  No  Handed:  Right  AIMS (if indicated):     Assets:  Physical Health Social Support  Sleep:  Number of Hours: 6.75   Current Medications: Current Facility-Administered Medications  Medication Dose Route Frequency Provider Last Rate Last Dose  . acetaminophen (TYLENOL) tablet 650 mg  650 mg Oral Q6H PRN Earney Navy, NP      . alum & mag hydroxide-simeth (MAALOX/MYLANTA) 200-200-20 MG/5ML suspension 30 mL  30 mL Oral Q4H PRN Earney Navy, NP      . ARIPiprazole (ABILIFY) tablet 10 mg  10 mg Oral QHS Pervis Macintyre   10 mg at 12/26/12 2219  . benztropine (COGENTIN) tablet 1 mg  1 mg Oral QHS Dorotha Hirschi   1 mg at 12/26/12 2219  . hydrOXYzine (ATARAX/VISTARIL) tablet 50 mg  50 mg Oral QHS PRN Earney Navy, NP   50 mg at 12/24/12 2140  . magnesium hydroxide (MILK OF MAGNESIA) suspension 30 mL  30 mL Oral Daily PRN Earney Navy, NP      . OLANZapine zydis (ZYPREXA) disintegrating tablet 10 mg  10 mg Oral Q8H PRN Rahkeem Senft      . traZODone (DESYREL) tablet 50 mg  50 mg Oral QHS PRN Edit Ricciardelli   50 mg at 12/25/12 2027    Lab Results:  Results for orders placed during the hospital encounter of 12/24/12 (from the  past 48 hour(s))  GLUCOSE, CAPILLARY     Status: Abnormal   Collection Time    12/25/12  8:30 PM      Result Value Range   Glucose-Capillary 129 (*) 70 - 99 mg/dL  GLUCOSE, CAPILLARY     Status: Abnormal   Collection Time    12/26/12  6:55 AM      Result Value Range   Glucose-Capillary 113 (*) 70 - 99 mg/dL   Comment 1 Notify RN    GLUCOSE, CAPILLARY     Status: Abnormal   Collection Time    12/26/12 11:35 AM      Result Value Range   Glucose-Capillary 136 (*) 70 -  99 mg/dL  GLUCOSE, CAPILLARY     Status: Abnormal   Collection Time    12/26/12  4:21 PM      Result Value Range   Glucose-Capillary 124 (*) 70 - 99 mg/dL  HEMOGLOBIN Z6X     Status: Abnormal   Collection Time    12/26/12  7:41 PM      Result Value Range   Hemoglobin A1C 6.3 (*) <5.7 %   Comment: (NOTE)                                                                               According to the ADA Clinical Practice Recommendations for 2011, when     HbA1c is used as a screening test:      >=6.5%   Diagnostic of Diabetes Mellitus               (if abnormal result is confirmed)     5.7-6.4%   Increased risk of developing Diabetes Mellitus     References:Diagnosis and Classification of Diabetes Mellitus,Diabetes     Care,2011,34(Suppl 1):S62-S69 and Standards of Medical Care in             Diabetes - 2011,Diabetes Care,2011,34 (Suppl 1):S11-S61.   Mean Plasma Glucose 134 (*) <117 mg/dL   Comment: Performed at [facility]    Physical Findings: AIMS: Facial and Oral Movements Muscles of Facial Expression: None, normal Lips and Perioral Area: None, normal Jaw: None, normal Tongue: None, normal,Extremity Movements Upper (arms, wrists, hands, fingers): None, normal Lower (legs, knees, ankles, toes): None, normal, Trunk Movements Neck, shoulders, hips: None, normal, Overall Severity Severity of abnormal movements (highest score from questions above): None, normal Incapacitation due to abnormal movements: None, normal Patient's awareness of abnormal movements (rate only patient's report): No Awareness, Dental Status Current problems with teeth and/or dentures?: No Does patient usually wear dentures?: No  CIWA:  CIWA-Ar Total: 0 COWS:  COWS Total Score: 1  Treatment Plan Summary: Daily contact with patient to assess and evaluate symptoms and progress in treatment Medication management  Plan:1. Admit for crisis management and stabilization. 2. Medication management to  reduce current symptoms to base line and improve the     patient's overall level of functioning 3. Treat health problems as indicated. 4. Develop treatment plan to decrease risk of relapse upon discharge and the need for     readmission. 5. Psycho-social education regarding relapse prevention and self care. 6. Health care follow up as needed for  medical problems. 7. Restart home medications where appropriate. 8. Will continue Ability 10mg  po Qhs  Medical Decision Making Problem Points:  Established problem, stable/improving (1), Review of last therapy session (1) and Review of psycho-social stressors (1) Data Points:  Order Aims Assessment (2) Review of medication regiment & side effects (2) Review of new medications or change in dosage (2)  I certify that inpatient services furnished can reasonably be expected to improve the patient's condition.   Thedore Mins, MD 12/27/2012, 11:22 AM

## 2012-12-27 NOTE — Progress Notes (Signed)
D: Patient pleasant and cooperative with staff and peers. Patient's affect and mood is appropriate to circumstance. She reported on the self inventory sheet that she's sleeping well, appetite and ability to pay attention are good and energy level is normal. Patient reported no depression, but rated feelings of hopelessness "2". She's attending groups and interacting with peers in the milieu. No scheduled medications at this time.   A: Support and encouragement provided to patient. Monitor Q15 minute checks for safety.  R: Patient receptive. Denies SI/HI. Patient remains safe on the unit.

## 2012-12-28 DIAGNOSIS — F1210 Cannabis abuse, uncomplicated: Secondary | ICD-10-CM

## 2012-12-28 DIAGNOSIS — F259 Schizoaffective disorder, unspecified: Principal | ICD-10-CM

## 2012-12-28 LAB — COMPREHENSIVE METABOLIC PANEL
ALT: 20 U/L (ref 0–35)
Alkaline Phosphatase: 51 U/L (ref 39–117)
CO2: 25 mEq/L (ref 19–32)
Calcium: 9.4 mg/dL (ref 8.4–10.5)
Chloride: 102 mEq/L (ref 96–112)
GFR calc Af Amer: 77 mL/min — ABNORMAL LOW (ref >90)
GFR calc non Af Amer: 66 mL/min — ABNORMAL LOW (ref >90)
Glucose, Bld: 145 mg/dL — ABNORMAL HIGH (ref 70–99)
Potassium: 4 mEq/L (ref 3.5–5.1)
Sodium: 137 mEq/L (ref 135–145)
Total Bilirubin: 0.2 mg/dL — ABNORMAL LOW (ref 0.3–1.2)

## 2012-12-28 NOTE — Progress Notes (Signed)
[name], female   DOB: 06/07/1975, 37 y.o.   MRN: [id number]  [facility] MD Progress Note  12/28/2012 1:06 PM [name]  MRN:  [id number] Subjective: Patient states "I guess I need to be more serious about taking my medications. I didn't think it was a big deal to miss a few days. I don't like coming into the hospital back to back. I am going to take them after I leave."  Objective: Patient reports decreased, agitation, irritability, psychosis and delusional symptoms. However, the patient appears depressed and withdrawn. She is assessed resting in bed late in the morning. Patient is not regularly attending groups as well. The patient is guarded and denies presence of any psychiatric symptoms despite appearing very depressed. [place] has limited insight into her illness and also minimizes the negative effects of her Cannabis Use Disorder on her mental health and ability to remain compliant with treatment.   Diagnosis:   DSM5: Schizophrenia Disorders:  Delusional Disorder (297.1) and Schizophrenia (295.7) Obsessive-Compulsive Disorders:   Trauma-Stressor Disorders:   Substance/Addictive Disorders:  Cannabis Use Disorder - Severe (304.30) Depressive Disorders:  Disruptive Mood Dysregulation Disorder (296.99)  Axis I: Schizoaffective Disorder            Cannabis use disorder severe ADL's:  Intact  Sleep: Fair  Appetite:  Fair  Suicidal Ideation: denies  Homicidal Ideation: denies  AEB (as evidenced by):  Psychiatric Specialty Exam: Review of Systems  Constitutional: Negative.   HENT: Negative.   Eyes: Negative.   Respiratory: Negative.   Cardiovascular: Negative.   Gastrointestinal: Negative.   Genitourinary: Negative.   Musculoskeletal: Negative.   Skin: Negative.   Neurological: Negative.   Endo/Heme/Allergies: Negative.   Psychiatric/Behavioral: Positive for depression, hallucinations and substance abuse. The patient is nervous/anxious.     Blood  pressure 129/78, pulse 102, temperature 97.5 F (36.4 C), temperature source Oral, resp. rate 18, height 5\' 3"  (1.6 m), weight 88.451 kg (195 lb), last menstrual period 12/23/2012.Body mass index is 34.55 kg/(m^2).  General Appearance: Fairly Groomed  Patent attorney::  Minimal  Speech:  Pressured  Volume:  Normal  Mood:  Dysphoric and Irritable  Affect:  Blunt  Thought Process:  Circumstantial  Orientation:  Full (Time, Place, and Person)  Thought Content:  Delusions and Hallucinations: Auditory  Suicidal Thoughts:  No  Homicidal Thoughts:  No  Memory:  Immediate;   Fair Recent;   Fair Remote;   Fair  Judgement:  Poor  Insight:  Lacking  Psychomotor Activity:  Increased  Concentration:  Fair  Recall:  Fair  Akathisia:  No  Handed:  Right  AIMS (if indicated):     Assets:  Physical Health Social Support  Sleep:  Number of Hours: 6.5   Current Medications: Current Facility-Administered Medications  Medication Dose Route Frequency Provider Last Rate Last Dose  . acetaminophen (TYLENOL) tablet 650 mg  650 mg Oral Q6H PRN Earney Navy, NP      . alum & mag hydroxide-simeth (MAALOX/MYLANTA) 200-200-20 MG/5ML suspension 30 mL  30 mL Oral Q4H PRN Earney Navy, NP      . ARIPiprazole (ABILIFY) tablet 10 mg  10 mg Oral QHS Mojeed Akintayo   10 mg at 12/27/12 2126  . benztropine (COGENTIN) tablet 1 mg  1 mg Oral QHS Mojeed Akintayo   1 mg at 12/27/12 2126  . hydrOXYzine (ATARAX/VISTARIL) tablet 50 mg  50 mg Oral QHS PRN Earney Navy, NP   50 mg at 12/24/12 2140  .  magnesium hydroxide (MILK OF MAGNESIA) suspension 30 mL  30 mL Oral Daily PRN Earney Navy, NP      . OLANZapine zydis (ZYPREXA) disintegrating tablet 10 mg  10 mg Oral Q8H PRN Mojeed Akintayo      . traZODone (DESYREL) tablet 50 mg  50 mg Oral QHS PRN Mojeed Akintayo   50 mg at 12/25/12 2027    Lab Results:  Results for orders placed during the hospital encounter of 12/24/12 (from the past 48 hour(s))   GLUCOSE, CAPILLARY     Status: Abnormal   Collection Time    12/26/12  4:21 PM      Result Value Range   Glucose-Capillary 124 (*) 70 - 99 mg/dL  HEMOGLOBIN U9W     Status: Abnormal   Collection Time    12/26/12  7:41 PM      Result Value Range   Hemoglobin A1C 6.3 (*) <5.7 %   Comment: (NOTE)                                                                               According to the ADA Clinical Practice Recommendations for 2011, when     HbA1c is used as a screening test:      >=6.5%   Diagnostic of Diabetes Mellitus               (if abnormal result is confirmed)     5.7-6.4%   Increased risk of developing Diabetes Mellitus     References:Diagnosis and Classification of Diabetes Mellitus,Diabetes     Care,2011,34(Suppl 1):S62-S69 and Standards of Medical Care in             Diabetes - 2011,Diabetes Care,2011,34 (Suppl 1):S11-S61.   Mean Plasma Glucose 134 (*) <117 mg/dL   Comment: Performed at [facility]    Physical Findings: AIMS: Facial and Oral Movements Muscles of Facial Expression: None, normal Lips and Perioral Area: None, normal Jaw: None, normal Tongue: None, normal,Extremity Movements Upper (arms, wrists, hands, fingers): None, normal Lower (legs, knees, ankles, toes): None, normal, Trunk Movements Neck, shoulders, hips: None, normal, Overall Severity Severity of abnormal movements (highest score from questions above): None, normal Incapacitation due to abnormal movements: None, normal Patient's awareness of abnormal movements (rate only patient's report): No Awareness, Dental Status Current problems with teeth and/or dentures?: No Does patient usually wear dentures?: No  CIWA:  CIWA-Ar Total: 0 COWS:  COWS Total Score: 1  Treatment Plan Summary: Daily contact with patient to assess and evaluate symptoms and progress in treatment Medication management  Plan:1. Continue crisis management and stabilization. 2. Medication management to reduce  current symptoms to base line and improve the patient's overall level of functioning 3. Treat health problems as indicated. 4. Develop treatment plan to decrease risk of relapse upon discharge and the need for readmission. 5. Psycho-social education regarding relapse prevention and self care. 6. Health care follow up as needed for medical problems. Diabetic consult for elevated A1C.  7. Restart home medications where appropriate. 8. Will continue Ability 10mg  po Qhs and stress the importance of compliance to reduce repeat hospitalization.   Medical Decision Making Problem Points:  Established problem, stable/improving (1), Review  of last therapy session (1) and Review of psycho-social stressors (1) Data Points:  Order Aims Assessment (2) Review of medication regiment & side effects (2) Review of new medications or change in dosage (2)  I certify that inpatient services furnished can reasonably be expected to improve the patient's condition.   Fransisca Kaufmann, NP-C 12/28/2012, 1:06 PM

## 2012-12-28 NOTE — Progress Notes (Signed)
Psychoeducational Group Note  Date:  12/28/2012 Time:  2000  Group Topic/Focus:  Wrap-Up Group:   The focus of this group is to help patients review their daily goal of treatment and discuss progress on daily workbooks.  Participation Level: Did Not Attend  Participation Quality:  Not Applicable  Affect:  Not Applicable  Cognitive:  Not Applicable  Insight:  Not Applicable  Engagement in Group: Not Applicable  Additional Comments: The patient did not attend group since she was asleep.   Hazle Coca S 12/28/2012, 9:42 PM

## 2012-12-28 NOTE — Progress Notes (Signed)
Pt did not attend karaoke group this evening.  

## 2012-12-28 NOTE — [facility] Group Notes (Signed)
[facility] LCSW Group Therapy  12/28/2012 3:06 PM  Type of Therapy:  Group Therapy   Participation Level:  Did not attend.  CSW intern came into her room after group.  She asked when she would be discharge.  She was explained the procedures.  She indicated that her relationship with her mother is doing well, and she would like to seek therapeutic services with the [facility].    Simona Huh   12/28/2012  3:06 PM

## 2012-12-28 NOTE — Progress Notes (Addendum)
D: Pt in bed resting on her side.  Despite encouragement, pt decided to stay back from [facility] this evening.There were no concerns voiced by this pt. Pt denied any SI/HI/AVH. Pt also denied any physical pain.  A: Writer administered scheduled medications to pt. Continued support and availability as needed was extended to this pt. Staff continue to monitor pt with q80min checks.  R: No adverse drug reactions noted. Pt receptive to treatment. Pt remains safe at this time.

## 2012-12-28 NOTE — Progress Notes (Signed)
D: Patient denies SI or AVH at this time. Patient has a flat mood and depressed affect.  A: Patient given emotional support from RN. Patient encouraged to come to staff with concerns and/or questions. Patient's medication routine continued. Patient's orders and plan of care reviewed.   R: Patient remains appropriate and cooperative. Will continue to monitor patient q15 minutes for safety.  Pt. Seen attending some groups and interacting appropriately with others within the milieu.

## 2012-12-28 NOTE — Tx Team (Signed)
  Interdisciplinary Treatment Plan Update   Date Reviewed:  12/28/2012  Time Reviewed:  12:46 PM  Progress in Treatment:   Attending groups: Yes Participating in groups: Yes Taking medication as prescribed: Yes  Tolerating medication: Yes Family/Significant other contact made: No Patient understands diagnosis: Yes, by taking her medications.    Discussing patient identified problems/goals with staff: Yes  See initial care plan Medical problems stabilized or resolved: Yes Denies suicidal/homicidal ideation: Yes  Patient has not harmed self or others: Yes  For review of initial/current patient goals, please see plan of care.  Estimated Length of Stay:  4-5 days  Reason for Continuation of Hospitalization: Delusions  Mania Medication stabilization  New Problems/Goals identified:  N/A  Discharge Plan or Barriers:   return home, follow up [facility]   Additional Comments:  Patient was brought to [facility] by her brother. Patient was acting bizarre. She was initially a walk in at [facility] and acted out in the assessment room. Patient was IVCd and referred to ED. In the ER, patient was loud and threatening to kill her mother. She also reported that she was hearing voices. Patient was discharged from [facility] in July and stopped taking her medications. Patient does live with a roommate. She denies any SI/HI. She does appear to be responding to internal stimuli. Admits to smoking weed on an on-going basis.   Attendees:  Signature: Thedore Mins, MD 12/28/2012 12:46 PM   Signature: Richelle Ito, LCSW 12/28/2012 12:46 PM  Signature: Fransisca Kaufmann, NP 12/28/2012 12:46 PM  Signature: Joslyn Devon, RN 12/28/2012 12:46 PM  Signature: Liborio Nixon, RN 12/28/2012 12:46 PM  Signature:  12/28/2012 12:46 PM  Signature:   12/28/2012 12:46 PM  Signature:    Signature:    Signature:    Signature:    Signature:    Signature:      Scribe for Treatment Team:   Richelle Ito, LCSW  12/28/2012 12:46 PM

## 2012-12-28 NOTE — Progress Notes (Signed)
Inpatient Diabetes Program Recommendations  AACE/ADA: New Consensus Statement on Inpatient Glycemic Control (2013)  Target Ranges:  Prepandial:   less than 140 mg/dL      Peak postprandial:   less than 180 mg/dL (1-2 hours)      Critically ill patients:  140 - 180 mg/dL   Reason for Assessment:  Diabetes Consult - HgbA1C 6.3% Results for [name], [name] (MRN [id number]) as of 12/28/2012 15:00  Ref. Range 12/23/2012 00:55  Sodium Latest Range: 135-145 mEq/L 137  Potassium Latest Range: 3.5-5.1 mEq/L 3.9  Chloride Latest Range: 96-112 mEq/L 103  CO2 Latest Range: 19-32 mEq/L 21  BUN Latest Range: 6-23 mg/dL 17  Creatinine Latest Range: 0.50-1.10 mg/dL 1.19 (H)  Calcium Latest Range: 8.4-10.5 mg/dL 14.7  GFR calc non Af Amer Latest Range: >90 mL/min 51 (L)  GFR calc Af Amer Latest Range: >90 mL/min 59 (L)  Glucose Latest Range: 70-99 mg/dL 829 (H)  Results for [name], [name] (MRN [id number]) as of 12/28/2012 15:00  Ref. Range 12/26/2012 06:55 12/26/2012 11:35 12/26/2012 16:21  Glucose-Capillary Latest Range: 70-99 mg/dL 784 (H) 696 (H) 295 (H)   Blood sugars stable here at [facility]. HgbA1C indicative of pre-diabetes.   Needs healthy diet and exercise for first line therapy.  Weight loss will definitely reduce risk factor for developing full-blown diabetes.  Inpatient Diabetes Program Recommendations Correction (SSI): Add Novolog sensitive tidwc Oral Agents: May benefit from addition of glipizide 10 mg bid. Recheck renal function.  Hesitate to recommend metformin until renal function is cleared. HgbA1C: 6.3% diagnosis of pre-diabetes - needs lifestyle modification such as diet, exercise and stress management Diet: jEncourage pt to make healthy choices in cafeteria using portion control  Note: Will continue to follow. Thank you. Ailene Ards, RD, LDN, CDE Inpatient Diabetes Coordinator [phone]

## 2012-12-28 NOTE — [facility] Group Notes (Signed)
[facility] LCSW Aftercare Discharge Planning Group Note   12/28/2012 10:04 AM  Participation Quality:  Did not attend.  Was asleep.  Her medication management is through [place] and she will D/C to her parent's hosue.    [name]

## 2012-12-28 NOTE — Progress Notes (Signed)
D: Pt denies SI/HI/AVH. Pt is pleasant and cooperative.  Pt remained in bed most of the evening. Pt guarded, and forwards little. Pt hopeful to be leaving early next week.   A: Pt was offered support and encouragement. Pt was given scheduled medications. Pt was encourage to attend groups. Q 15 minute checks were done for safety.   R: Pt is taking medication. Pt has no complaints at this time .Pt receptive to treatment and safety maintained on unit.

## 2012-12-28 NOTE — Progress Notes (Signed)
Recreation Therapy Notes  Date: 10.24.2014 Time: 9:30am Location: [address] Dayroom   Group Topic: Decision Making  Goal Area(s) Addresses:  Patient will verbalize benefit of using good decision making skills. Patient will identify method of good decision making.  Behavioral Response: Did not attend   Jearl Klinefelter, LRT/CTRS  Jearl Klinefelter 12/28/2012 12:13 PM

## 2012-12-29 NOTE — Progress Notes (Signed)
[facility] MD Progress Note  12/29/2012 1:40 PM VALARIA KOHUT  MRN:  [id number] Subjective:  [name] is lying in bed and responds immediately to verbal prompts.  She denies that she is experiencing any depression today and rates her depression as a 1 on a scale of 1 -10 where 10 is the worst. She endorses some anxiety in that she wants to go home.  She rates her axiety as a 3 or 4 on a scale of 1 -10.  she denies any SI/HI of AVH.  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   HENT: Negative.   Eyes: Negative.   Respiratory: Negative.   Cardiovascular: Negative.   Gastrointestinal: Negative.   Genitourinary: Negative.   Musculoskeletal: Negative.   Skin: Negative.   Neurological: Negative.   Endo/Heme/Allergies: Negative.   Psychiatric/Behavioral: The patient is nervous/anxious.     Blood pressure 89/57, pulse 106, temperature 96.5 F (35.8 C), temperature source Oral, resp. rate 18, height 5\' 3"  (1.6 m), weight 88.451 kg (195 lb), last menstrual period 12/23/2012.Body mass index is 34.55 kg/(m^2).  General Appearance: Disheveled  Eye Contact::  Good  Speech:  Clear and Coherent  Volume:  Normal  Mood:  Depressed  Affect:  Congruent  Thought Process:  Linear  Orientation:  Full (Time, Place, and Person)  Thought Content:  WDL  Suicidal Thoughts:  No  Homicidal Thoughts:  No  Memory:  Immediate;   Good  Judgement:  Fair  Insight:  Fair  Psychomotor  Activity:  Decreased  Concentration:  Good  Recall:  Good  Akathisia:  No  Handed:  Right  AIMS (if indicated):     Assets:  Communication Skills Desire for Improvement Resilience Social Support  Sleep:  Number of Hours: 6.75   Current Medications: Current Facility-Administered Medications  Medication Dose Route Frequency Provider Last Rate Last Dose  . acetaminophen (TYLENOL) tablet 650 mg  650 mg Oral Q6H PRN Earney Navy, NP      . alum & mag hydroxide-simeth (MAALOX/MYLANTA) 200-200-20 MG/5ML suspension 30 mL  30 mL Oral Q4H PRN Earney Navy, NP      . ARIPiprazole (ABILIFY) tablet 10 mg  10 mg Oral QHS Mojeed Akintayo   10 mg at 12/28/12 2231  . benztropine (COGENTIN) tablet 1 mg  1 mg Oral QHS Mojeed Akintayo   1 mg at 12/28/12 2231  . hydrOXYzine (ATARAX/VISTARIL) tablet 50 mg  50 mg Oral QHS PRN Earney Navy, NP   50 mg at 12/24/12 2140  . magnesium hydroxide (MILK OF MAGNESIA) suspension 30 mL  30 mL Oral Daily PRN Earney Navy, NP      . OLANZapine zydis (ZYPREXA) disintegrating tablet 10 mg  10 mg Oral Q8H PRN Mojeed Akintayo      . traZODone (DESYREL) tablet 50 mg  50 mg Oral QHS PRN Mojeed Akintayo   50 mg at 12/25/12 2027    Lab Results:  Results for orders placed during the hospital encounter of 12/24/12 (from the past 48 hour(s))  COMPREHENSIVE METABOLIC PANEL     Status:  Abnormal   Collection Time    12/28/12  7:54 PM      Result Value Range   Sodium 137  135 - 145 mEq/L   Potassium 4.0  3.5 - 5.1 mEq/L   Chloride 102  96 - 112 mEq/L   CO2 25  19 - 32 mEq/L   Glucose, Bld 145 (*) 70 - 99 mg/dL   BUN 14  6 - 23 mg/dL   Creatinine, Ser 1.61  0.50 - 1.10 mg/dL   Calcium 9.4  8.4 - 09.6 mg/dL   Total Protein 7.5  6.0 - 8.3 g/dL   Albumin 3.7  3.5 - 5.2 g/dL   AST 19  0 - 37 U/L   ALT 20  0 - 35 U/L   Alkaline Phosphatase 51  39 - 117 U/L   Total Bilirubin 0.2 (*) 0.3 - 1.2 mg/dL   GFR calc non Af Amer 66 (*) >90 mL/min   GFR calc Af  Amer 77 (*) >90 mL/min   Comment: (NOTE)     The eGFR has been calculated using the CKD EPI equation.     This calculation has not been validated in all clinical situations.     eGFR's persistently <90 mL/min signify possible Chronic Kidney     Disease.     Performed at [facility]    Physical Findings: AIMS: Facial and Oral Movements Muscles of Facial Expression: None, normal Lips and Perioral Area: None, normal Jaw: None, normal Tongue: None, normal,Extremity Movements Upper (arms, wrists, hands, fingers): None, normal Lower (legs, knees, ankles, toes): None, normal, Trunk Movements Neck, shoulders, hips: None, normal, Overall Severity Severity of abnormal movements (highest score from questions above): None, normal Incapacitation due to abnormal movements: None, normal Patient's awareness of abnormal movements (rate only patient's report): No Awareness, Dental Status Current problems with teeth and/or dentures?: No Does patient usually wear dentures?: No  CIWA:  CIWA-Ar Total: 0 COWS:  COWS Total Score: 1  Treatment Plan Summary: Daily contact with patient to assess and evaluate symptoms and progress in treatment Medication management  Plan: 1. Admit for crisis management and stabilization.  2. Medication management to reduce current symptoms to base line and improve the patient's overall level of functioning  3. Treat health problems as indicated.  4. Develop treatment plan to decrease risk of relapse upon discharge and the need for readmission.  5. Psycho-social education regarding relapse prevention and self care.  6. Health care follow up as needed for medical problems.  7. Restart home medications where appropriate.  8. Will continue Ability 10mg  po Qhs  Medical Decision Making Problem Points:  Established problem, stable/improving (1) and Review of psycho-social stressors (1) Data Points:  Review or order clinical lab tests (1) Review of  medication regiment & side effects (2)  I certify that inpatient services furnished can reasonably be expected to improve the patient's condition.   WATT,ALAN 12/29/2012, 1:40 PM Agree with assessment and plan Madie Reno A. Dub Mikes, M.D.

## 2012-12-29 NOTE — Progress Notes (Signed)
[name], female   DOB: April 13, 1975, 37 y.o.   MRN: [id number] D. Pt presents with depressed mood, affect blunted. Patient states ''I'm fine. I don't need any medications right now I take everything I need at night. '' Patient denies any auditory or visual hallucinations. Pt denies any SI/HI. Pt has been isolative to room throughout most of shift, and in bed resting throughout most of am despite encouragement to attend groups. Pt with minimal interaction with peers and staff. A. Support and encouragement provided.R. Pt in no acute distress at this time. No further voiced concerns at this time. Will continue to monitor q 15 minutes for safety.

## 2012-12-29 NOTE — [facility] Group Notes (Signed)
[facility] Group Notes: (Clinical Social Work)   12/29/2012      Type of Therapy:  Group Therapy   Participation Level:  Did Not Attend    Ambrose Mantle, LCSW 12/29/2012, 1:02 PM

## 2012-12-29 NOTE — [facility] Group Notes (Signed)
[facility] Group Notes:  (Nursing/MHT/Case Management/Adjunct)  Date:  12/29/2012  Time:  0900 Type of Therapy: Psychoeducational Skills  Participation Level: Did Not Attend  Participation Quality: na  Affect: na  Cognitive: na  Insight: None  Engagement in Group: na  Modes of Intervention: na  Summary of Progress/Problems:  [name] 12/29/2012, 10:30 AM

## 2012-12-29 NOTE — Progress Notes (Signed)
Psychoeducational Group Note  Date:  12/29/2012 Time:  2000  Group Topic/Focus:  Wrap-Up Group:   The focus of this group is to help patients review their daily goal of treatment and discuss progress on daily workbooks.  Participation Level: Did Not Attend  Participation Quality:  Not Applicable  Affect:  Not Applicable  Cognitive:  Not Applicable  Insight:  Not Applicable  Engagement in Group: Not Applicable  Additional Comments:  The patient did not attend group since she was asleep in her bed.   Hazle Coca S 12/29/2012, 8:49 PM

## 2012-12-30 NOTE — [facility] Group Notes (Signed)
[facility] Group Notes:  (Clinical Social Work)  12/30/2012   11:15am-12:00pm  Summary of Progress/Problems:  The main focus of today's process group was to listen to a variety of genres of music and to identify that different types of music provoke different responses.  The patient then was able to identify personally what was soothing for them, as well as energizing.  Handouts were used to record feelings evoked, as well as how patient can personally use this knowledge in sleep habits, with depression, and with other symptoms.  The patient expressed understanding of concepts, as well as knowledge of how each type of music affected them and how this can be used when they are at home as a tool in their recovery.  Type of Therapy:  Music Therapy   Participation Level:  Active  Participation Quality:  Attentive and Sharing  Affect:  Flat and Depressed  Cognitive:  Oriented  Insight:  Engaged  Engagement in Therapy:  Limited  Modes of Intervention:   Activity, Exploration  Ambrose Mantle, LCSW 12/30/2012, 1:20 PM

## 2012-12-30 NOTE — Progress Notes (Signed)
Psychoeducational Group Note  Date:  12/30/2012 Time:  2000  Group Topic/Focus:  Wrap-Up Group:   The focus of this group is to help patients review their daily goal of treatment and discuss progress on daily workbooks.  Participation Level: Did Not Attend  Participation Quality:  Not Applicable  Affect:  Not Applicable  Cognitive:  Not Applicable  Insight:  Not Applicable  Engagement in Group: Not Applicable  Additional Comments:  The patient slept in her bedroom during group time.   Hazle Coca S 12/30/2012, 9:04 PM

## 2012-12-30 NOTE — Progress Notes (Signed)
Pt has been resting in bed all evening. Denies complaints. Pt states, "I'm just passing the time until I can leave. I'm doing much better." No SI/HI/AVH. Medicated per orders without difficulty. Pt is safe. [name]

## 2012-12-30 NOTE — [facility] Group Notes (Signed)
[facility] Group Notes:  (Nursing/MHT/Case Management/Adjunct)  Date:  12/30/2012  Time:  10:42 AM  Type of Therapy:  Psychoeducational Skills  Participation Level:  Did Not Attend  Participation Quality:  na  Affect:  na  Cognitive:  na  Insight:  None  Engagement in Group:  na  Modes of Intervention:  na  Summary of Progress/Problems:  [name] 12/30/2012, 10:42 AM

## 2012-12-30 NOTE — Progress Notes (Signed)
[name], female   DOB: March 18, 1975, 37 y.o.   MRN: [id number]  D: Pt denies SI/HI/AVH. Pt is pleasant and cooperative. Pt stays in room most of the night and does not participate in groups. Little interaction, forwards little and remains guarded.    A: Pt was offered support and encouragement. Pt was given scheduled medications. Pt was encourage to attend groups. Q 15 minute checks were done for safety.   R: Pt is taking medication. Pt has no complaints at this time .Pt receptive to treatment and safety maintained on unit.

## 2012-12-30 NOTE — Progress Notes (Signed)
[facility] MD Progress Note  12/30/2012 11:13 AM [name]  MRN:  [id number] Subjective:  [name] reports she is doing "ok."  She rates her anxiety and depression as a 2 on a scale of 1 -10, where 10 is the worst.  She denies any SI/HI or AVH.  She endorses good sleep and appetite.  She reports she is feeling much calmer, and less manic.  She asked about possible dicharge date.  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   HENT: Negative.   Eyes: Negative.   Respiratory: Negative.   Cardiovascular: Negative.   Gastrointestinal: Negative.   Genitourinary: Negative.   Musculoskeletal: Negative.   Skin: Negative.   Neurological: Negative.   Endo/Heme/Allergies: Negative.   Psychiatric/Behavioral: Negative.     Blood pressure 89/57, pulse 106, temperature 96.5 F (35.8 C), temperature source Oral, resp. rate 18, height 5\' 3"  (1.6 m), weight 88.451 kg (195 lb), last menstrual period 12/23/2012.Body mass index is 34.55 kg/(m^2).  General Appearance: Casual  Eye Contact::  Good  Speech:  Clear and Coherent  Volume:  Normal  Mood:  Depressed  Affect:  Congruent  Thought Process:  Linear  Orientation:  Full (Time, Place, and Person)  Thought Content:  WDL  Suicidal Thoughts:  No  Homicidal Thoughts:  No  Memory:  Immediate;   Good  Judgement:  Fair  Insight:  Fair  Psychomotor Activity:  Normal  Concentration:  Good  Recall:  Good  Akathisia:  No   Handed:  Right  AIMS (if indicated):     Assets:  Communication Skills Desire for Improvement Resilience Social Support  Sleep:  Number of Hours: 6   Current Medications: Current Facility-Administered Medications  Medication Dose Route Frequency Provider Last Rate Last Dose  . acetaminophen (TYLENOL) tablet 650 mg  650 mg Oral Q6H PRN Earney Navy, NP      . alum & mag hydroxide-simeth (MAALOX/MYLANTA) 200-200-20 MG/5ML suspension 30 mL  30 mL Oral Q4H PRN Earney Navy, NP      . ARIPiprazole (ABILIFY) tablet 10 mg  10 mg Oral QHS Mojeed Akintayo   10 mg at 12/29/12 2150  . benztropine (COGENTIN) tablet 1 mg  1 mg Oral QHS Mojeed Akintayo   1 mg at 12/29/12 2150  . hydrOXYzine (ATARAX/VISTARIL) tablet 50 mg  50 mg Oral QHS PRN Earney Navy, NP   50 mg at 12/24/12 2140  . magnesium hydroxide (MILK OF MAGNESIA) suspension 30 mL  30 mL Oral Daily PRN Earney Navy, NP      . OLANZapine zydis (ZYPREXA) disintegrating tablet 10 mg  10 mg Oral Q8H PRN Mojeed Akintayo      . traZODone (DESYREL) tablet 50 mg  50 mg Oral QHS PRN Mojeed Akintayo   50 mg at 12/25/12 2027    Lab Results:  Results for orders placed during the hospital encounter of 12/24/12 (from the past 48 hour(s))  COMPREHENSIVE METABOLIC PANEL     Status: Abnormal   Collection Time    12/28/12  7:54 PM  Result Value Range   Sodium 137  135 - 145 mEq/L   Potassium 4.0  3.5 - 5.1 mEq/L   Chloride 102  96 - 112 mEq/L   CO2 25  19 - 32 mEq/L   Glucose, Bld 145 (*) 70 - 99 mg/dL   BUN 14  6 - 23 mg/dL   Creatinine, Ser 1.61  0.50 - 1.10 mg/dL   Calcium 9.4  8.4 - 09.6 mg/dL   Total Protein 7.5  6.0 - 8.3 g/dL   Albumin 3.7  3.5 - 5.2 g/dL   AST 19  0 - 37 U/L   ALT 20  0 - 35 U/L   Alkaline Phosphatase 51  39 - 117 U/L   Total Bilirubin 0.2 (*) 0.3 - 1.2 mg/dL   GFR calc non Af Amer 66 (*) >90 mL/min   GFR calc Af Amer 77 (*) >90 mL/min   Comment: (NOTE)     The eGFR has been calculated using  the CKD EPI equation.     This calculation has not been validated in all clinical situations.     eGFR's persistently <90 mL/min signify possible Chronic Kidney     Disease.     Performed at [facility]    Physical Findings: AIMS: Facial and Oral Movements Muscles of Facial Expression: None, normal Lips and Perioral Area: None, normal Jaw: None, normal Tongue: None, normal,Extremity Movements Upper (arms, wrists, hands, fingers): None, normal Lower (legs, knees, ankles, toes): None, normal, Trunk Movements Neck, shoulders, hips: None, normal, Overall Severity Severity of abnormal movements (highest score from questions above): None, normal Incapacitation due to abnormal movements: None, normal Patient's awareness of abnormal movements (rate only patient's report): No Awareness, Dental Status Current problems with teeth and/or dentures?: No Does patient usually wear dentures?: No  CIWA:  CIWA-Ar Total: 0 COWS:  COWS Total Score: 1  Treatment Plan Summary: Daily contact with patient to assess and evaluate symptoms and progress in treatment Medication management  Plan: 1. Continue crisis management and stabilization.  2. Medication management to reduce current symptoms to base line and improve the patient's overall level of functioning  3. Treat health problems as indicated.  4. Develop treatment plan to decrease risk of relapse upon discharge and the need for readmission.  5. Psycho-social education regarding relapse prevention and self care.  6. Health care follow up as needed for medical problems.  7. Restart home medications where appropriate.  8. Will continue Ability 10mg  po Qhs  Medical Decision Making Problem Points:  Established problem, stable/improving (1) and Review of psycho-social stressors (1) Data Points:  Review or order clinical lab tests (1) Review of medication regiment & side effects (2)  I certify that inpatient services furnished can  reasonably be expected to improve the patient's condition.   WATT,ALAN 12/30/2012, 11:13 AM Agree with assessment and plan Madie Reno A. Dub Mikes, M.D.

## 2012-12-30 NOTE — Progress Notes (Signed)
[name], female   DOB: 28-Sep-1975, 37 y.o.   MRN: [id number] D:Patient has been in her room in bed most of the morning.  Patient stated, "I'm doing just fine. I'm ready to go home."  Patient stated that her mother came to visit her yesterday and it went well.  Patient is no longer homicidal toward mom and plans to be compliant with her meds. Patient denies any depression or hopelessness.  She denies any SI/HI/AVH.  A:Continue to monitor medication management and MD orders.  Safety checks completed every 15 minutes per protocol.  R:Patient is receptive to staff.

## 2012-12-31 MED ORDER — BENZTROPINE MESYLATE 1 MG PO TABS: 1.0000 mg | ORAL_TABLET | Freq: Every day | ORAL | Status: DC

## 2012-12-31 MED ORDER — TRAZODONE HCL 50 MG PO TABS: 50.0000 mg | ORAL_TABLET | Freq: Every evening | ORAL | Status: DC | PRN

## 2012-12-31 MED ORDER — ARIPIPRAZOLE 10 MG PO TABS: 10.0000 mg | ORAL_TABLET | Freq: Every day | ORAL | Status: DC

## 2012-12-31 NOTE — Progress Notes (Signed)
[facility] Adult Case Management Discharge Plan :  Will you be returning to the same living situation after discharge: Yes,  home At discharge, do you have transportation home?:Yes,  family Do you have the ability to pay for your medications:Yes,  MCD  Release of information consent forms completed and in the chart;  Patient's signature needed at discharge.  Patient to Follow up at: Follow-up Information   Follow up with [facility]. (Go to the walk-in clinic between 8 and 9AM for your hospital follow up appointment.  )    Contact information:   [address]  [place]  [phone]] [phone]      Patient denies SI/HI:   Yes,  yes    Safety Planning and Suicide Prevention discussed:  Yes,  yes  Ida Rogue 12/31/2012, 9:52 AM

## 2012-12-31 NOTE — Tx Team (Signed)
  Interdisciplinary Treatment Plan Update   Date Reviewed:  12/31/2012  Time Reviewed:  9:54 AM  Progress in Treatment:   Attending groups: Yes Participating in groups: Yes Taking medication as prescribed: Yes  Tolerating medication: Yes Family/Significant other contact made: Yes  Patient understands diagnosis: Yes  Discussing patient identified problems/goals with staff: Yes Medical problems stabilized or resolved: Yes Denies suicidal/homicidal ideation: Yes Patient has not harmed self or others: Yes  For review of initial/current patient goals, please see plan of care.  Estimated Length of Stay:  D/C today  Reason for Continuation of Hospitalization:   New Problems/Goals identified:  N/A  Discharge Plan or Barriers:   return home, follow up outpt  Additional Comments:  Attendees:  Signature: Thedore Mins, MD 12/31/2012 9:54 AM   Signature: Richelle Ito, LCSW 12/31/2012 9:54 AM  Signature: Fransisca Kaufmann, NP 12/31/2012 9:54 AM  Signature: Joslyn Devon, RN 12/31/2012 9:54 AM  Signature: Liborio Nixon, RN 12/31/2012 9:54 AM  Signature:  12/31/2012 9:54 AM  Signature:   12/31/2012 9:54 AM  Signature:    Signature:    Signature:    Signature:    Signature:    Signature:      Scribe for Treatment Team:   Richelle Ito, LCSW  12/31/2012 9:54 AM

## 2012-12-31 NOTE — Progress Notes (Signed)
[name], female   DOB: 1976-01-06, 37 y.o.   MRN: [id number] Patient discharged home today per MD order.  Patient received all personal belongings, prescriptions and medication samples.  She denies any SI/HI/AVH.  She will follow up with [facility].  Patient understood all discharge instructions and medication was reviewed with her.  Patient left ambulatory with her mother.

## 2012-12-31 NOTE — Progress Notes (Signed)
Recreation Therapy Notes  Date: 10.27.2014 Time: 9:30am Location: [address] Dayroom  Group Topic: Emotional Recognition, Communication  Goal Area(s) Addresses:  Patient will successfully display emotions during group session.  Patient will identify importance of displaying emotions. Patient will identify impact of emotions on communication.   Behavioral Response: Did not attend  Jearl Klinefelter, LRT/CTRS  Jearl Klinefelter 12/31/2012 1:39 PM

## 2012-12-31 NOTE — [facility] Suicide Risk Assessment (Signed)
Suicide Risk Assessment  Discharge Assessment     Demographic Factors:  Gay, lesbian, or bisexual orientation, Low socioeconomic status, Unemployed and female  Mental Status Per Nursing Assessment::   On Admission:     Current Mental Status by Physician: patient denies suicidal ideation, intent or plan  Loss Factors: Financial problems/change in socioeconomic status  Historical Factors: Impulsivity  Risk Reduction Factors:   Sense of responsibility to family, Living with another person, especially a relative and Positive social support  Continued Clinical Symptoms:  Alcohol/Substance Abuse/Dependencies  Cognitive Features That Contribute To Risk:  Closed-mindedness Polarized thinking    Suicide Risk:  Minimal: No identifiable suicidal ideation.  Patients presenting with no risk factors but with morbid ruminations; may be classified as minimal risk based on the severity of the depressive symptoms  Discharge Diagnoses:   AXIS I:  Schizoaffective disorder, unspecified condition              Cannabis use disorder  AXIS II:  Deferred AXIS III:   Past Medical History  Diagnosis Date  . Bipolar affective   . OCD (obsessive compulsive disorder)   . Anxiety    AXIS IV:  other psychosocial or environmental problems and problems related to social environment AXIS V:  61-70 mild symptoms  Plan Of Care/Follow-up recommendations:  Activity:  as tolerated Diet:  healthy Tests:  routine Other:  patient to keep her after care appointment  Is patient on multiple antipsychotic therapies at discharge:  No   Has Patient had three or more failed trials of antipsychotic monotherapy by history:  No  Recommended Plan for Multiple Antipsychotic Therapies: NA  Liam Bossman,MD 12/31/2012, 8:38 AM

## 2012-12-31 NOTE — Discharge Summary (Signed)
Physician Discharge Summary Note  Patient:  [name] is an 37 y.o., female MRN:  [id number] DOB:  08/31/1975 Patient phone:  [phone] (home)  Patient address:   [address] [place] [address]   Date of Admission:  12/24/2012 Date of Discharge: 12/31/2012  Discharge Diagnoses: Principal Problem:   Schizoaffective disorder, unspecified condition Active Problems:   Cannabis dependent  Axis Diagnosis:  AXIS I: Schizoaffective disorder, unspecified condition  Cannabis use disorder  AXIS II: Deferred  AXIS III:  Past Medical History   Diagnosis  Date   .  Bipolar affective    .  OCD (obsessive compulsive disorder)    .  Anxiety     AXIS IV: other psychosocial or environmental problems and problems related to social environment  AXIS V: 61-70 mild symptoms  Level of Care:  OP  Hospital Course:   This is a 37 year old female with a history of Bipolar Disorder who was last a patient at [facility] in July of this year. The patient was brought to [facility] under by her brother due to bizarre behaviors. Her brother reports that the patient has not been compliant with medications but rather smoking marijuana. Per the tele assessment note from 12/23/12 the patient acted very psychotic demonstrating intrusive behaviors and was observed responding to internal stimuli. The patient was petitioned for IVC by the [facility] as she escalated during the initial assessment and was continually making verbal threats towards her mother. The patient shows no insight into her symptoms today upon assessment. The patient is observed resting in bed with the covers over her head. She minimizes all events recorded in the chart stating "None of that is true. I was just angry at my mother and that's just the way I was feeling. That day I had been to the store to get steaks and everything was fine. I don't know why my mother called my brother to take me to the hospital. I am still hearing voices telling me to just  kill my mother. But I don't really think I would ever act on that." Patient became mildly agitated during routine admission questions. Of note on her previous admission in July patient reported the same story that her mother had committed her for no reason. Patient maintains that she has been compliant with medications but information in the chart contradicts this. She states "I took the oral abilify but I could not afford the long acting injectable."   While a patient in this hospital, [name] was enrolled in group counseling and activities. The patient's medications were managed by the MD. Patient was restarted on Abilify 10 mg daily for symptom management. The patient continued to refuse to be on an injectable medication. She verbalized that she would be compliant with her mediations after discharge. Patient attended treatment team meeting this am and met with treatment team members. Pt symptoms, treatment plan and response to treatment discussed. [name] endorsed that their symptoms have improved. Pt also stated that they are stable for discharge.  In other to control Principal Problem:   Schizoaffective disorder, unspecified condition Active Problems:   Cannabis dependent , they will continue psychiatric care on outpatient basis. They will follow-up at  Follow-up Information   Follow up with [facility]. (Go to the walk-in clinic between 8 and 9AM for your hospital follow up appointment.  )    Contact information:   [address]  [place]  [336] [phone]    .  In addition they were instructed to take all your medications as prescribed by your mental healthcare provider, to report any adverse effects and or reactions from your medicines to your outpatient provider promptly, patient is instructed and cautioned to not engage in alcohol and or illegal drug use while on prescription medicines, in the event of worsening symptoms, patient is instructed to call the crisis hotline, 911 and  or go to the nearest ED for appropriate evaluation and treatment of symptoms.   Upon discharge, patient adamantly denies suicidal, homicidal ideations, auditory, visual hallucinations and or delusional thinking. They left [facility] with all personal belongings in no apparent distress.  Consults:  See electronic record for details  Significant Diagnostic Studies:  See electronic record for details  Discharge Vitals:   Blood pressure 98/67, pulse 102, temperature 97.4 F (36.3 C), temperature source Oral, resp. rate 17, height 5\' 3"  (1.6 m), weight 88.451 kg (195 lb), last menstrual period 12/23/2012..  Mental Status Exam: See Mental Status Examination and Suicide Risk Assessment completed by Attending Physician prior to discharge.  Discharge destination:  Home  Is patient on multiple antipsychotic therapies at discharge:  No  Has Patient had three or more failed trials of antipsychotic monotherapy by history: N/A Recommended Plan for Multiple Antipsychotic Therapies: N/A Discharge Orders   Future Orders Complete By Expires   Discharge instructions  As directed    Comments:     Please follow up with your Primary Care Provider for possible treatment of your prediabetes as your A1C was elevated at 6.3.       Medication List    STOP taking these medications       trihexyphenidyl 2 MG tablet  Commonly known as:  ARTANE      TAKE these medications     Indication   ARIPiprazole 10 MG tablet  Commonly known as:  ABILIFY  Take 1 tablet (10 mg total) by mouth at bedtime.   Indication:  Manic-Depression, Schizophrenia     benztropine 1 MG tablet  Commonly known as:  COGENTIN  Take 1 tablet (1 mg total) by mouth at bedtime.   Indication:  Extrapyramidal Reaction caused by Medications     traZODone 50 MG tablet  Commonly known as:  DESYREL  Take 1 tablet (50 mg total) by mouth at bedtime as needed for sleep.   Indication:  Trouble Sleeping       Follow-up Information   Follow up  with [facility]. (Go to the walk-in clinic between 8 and 9AM for your hospital follow up appointment.  )    Contact information:   [address]  [place]  [336] [phone]     Follow-up recommendations:   Activities: Resume typical activities Diet: Resume typical diet Tests: none Other: Follow up with outpatient provider and report any side effects to out patient prescriber.  Comments:  Take all your medications as prescribed by your mental healthcare provider. Report any adverse effects and or reactions from your medicines to your outpatient provider promptly. Patient is instructed and cautioned to not engage in alcohol and or illegal drug use while on prescription medicines. In the event of worsening symptoms, patient is instructed to call the crisis hotline, 911 and or go to the nearest ED for appropriate evaluation and treatment of symptoms. Follow-up with your primary care provider for your other medical issues, concerns and or health care needs.  SignedFransisca Kaufmann NP-C 12/31/2012 4:39 PM

## 2012-12-31 NOTE — Progress Notes (Signed)
Adult Psychoeducational Group Note  Date:  12/31/2012 Time:  11:54 AM  Group Topic/Focus:  Self Care: The focus of this group is to help patients understand the importance of self-care in order to improve or restore emotional, physical, spiritual, interpersonal, and financial health.  Participation Level: Did Not Attend  Participation Quality:  Affect:  Cognitive:  Insight:  Engagement in Group:  Modes of Intervention:  Additional Comments: pt was sleeping, refused to attend.     Isla Pence M 12/31/2012, 11:54 AM

## 2013-01-01 NOTE — Discharge Summary (Signed)
Seen and agreed. Katniss Weedman, MD 

## 2013-01-01 NOTE — Progress Notes (Signed)
Seen and agreed. Louise Victory, MD 

## 2013-01-03 NOTE — Progress Notes (Signed)
Patient Discharge Instructions:  After Visit Summary (AVS):   Faxed to:  01/03/13 Discharge Summary Note:   Faxed to:  01/03/13 Psychiatric Admission Assessment Note:   Faxed to:  01/03/13 Suicide Risk Assessment - Discharge Assessment:   Faxed to:  01/03/13 Faxed/Sent to the Next Level Care provider:  01/03/13 Faxed to [facility] @ [phone]  [name], 01/03/2013, 3:45 PM

## 2013-03-05 DIAGNOSIS — F319 Bipolar disorder, unspecified: Secondary | ICD-10-CM | POA: Diagnosis not present

## 2013-03-05 DIAGNOSIS — F3110 Bipolar disorder, current episode manic without psychotic features, unspecified: Secondary | ICD-10-CM | POA: Diagnosis not present

## 2013-05-01 DIAGNOSIS — F3110 Bipolar disorder, current episode manic without psychotic features, unspecified: Secondary | ICD-10-CM | POA: Diagnosis not present

## 2013-06-06 DIAGNOSIS — F314 Bipolar disorder, current episode depressed, severe, without psychotic features: Secondary | ICD-10-CM | POA: Diagnosis not present

## 2013-06-17 DIAGNOSIS — F314 Bipolar disorder, current episode depressed, severe, without psychotic features: Secondary | ICD-10-CM | POA: Diagnosis not present

## 2013-06-20 DIAGNOSIS — F314 Bipolar disorder, current episode depressed, severe, without psychotic features: Secondary | ICD-10-CM | POA: Diagnosis not present

## 2013-06-22 ENCOUNTER — Encounter (HOSPITAL_COMMUNITY): Payer: Self-pay | Admitting: Emergency Medicine

## 2013-06-22 ENCOUNTER — Emergency Department (HOSPITAL_COMMUNITY)
Admission: EM | Admit: 2013-06-22 | Discharge: 2013-06-23 | Disposition: A | Discharge status: Home / Self Care | Payer: Medicare Other | Attending: Emergency Medicine | Admitting: Emergency Medicine

## 2013-06-22 DIAGNOSIS — F39 Unspecified mood [affective] disorder: Secondary | ICD-10-CM | POA: Insufficient documentation

## 2013-06-22 DIAGNOSIS — F411 Generalized anxiety disorder: Secondary | ICD-10-CM | POA: Insufficient documentation

## 2013-06-22 DIAGNOSIS — F309 Manic episode, unspecified: Secondary | ICD-10-CM | POA: Insufficient documentation

## 2013-06-22 DIAGNOSIS — F429 Obsessive-compulsive disorder, unspecified: Secondary | ICD-10-CM | POA: Diagnosis not present

## 2013-06-22 DIAGNOSIS — F1210 Cannabis abuse, uncomplicated: Secondary | ICD-10-CM | POA: Diagnosis present

## 2013-06-22 DIAGNOSIS — F259 Schizoaffective disorder, unspecified: Secondary | ICD-10-CM | POA: Insufficient documentation

## 2013-06-22 DIAGNOSIS — IMO0002 Reserved for concepts with insufficient information to code with codable children: Secondary | ICD-10-CM | POA: Insufficient documentation

## 2013-06-22 DIAGNOSIS — Z79899 Other long term (current) drug therapy: Secondary | ICD-10-CM | POA: Insufficient documentation

## 2013-06-22 DIAGNOSIS — F17200 Nicotine dependence, unspecified, uncomplicated: Secondary | ICD-10-CM | POA: Insufficient documentation

## 2013-06-22 DIAGNOSIS — F909 Attention-deficit hyperactivity disorder, unspecified type: Secondary | ICD-10-CM | POA: Insufficient documentation

## 2013-06-22 DIAGNOSIS — F3010 Manic episode without psychotic symptoms, unspecified: Secondary | ICD-10-CM

## 2013-06-22 DIAGNOSIS — Z3202 Encounter for pregnancy test, result negative: Secondary | ICD-10-CM | POA: Insufficient documentation

## 2013-06-22 DIAGNOSIS — F1220 Cannabis dependence, uncomplicated: Secondary | ICD-10-CM | POA: Insufficient documentation

## 2013-06-22 LAB — RAPID URINE DRUG SCREEN, HOSP PERFORMED
Amphetamines: NOT DETECTED
BENZODIAZEPINES: NOT DETECTED
Barbiturates: NOT DETECTED
Cocaine: NOT DETECTED
OPIATES: NOT DETECTED
Tetrahydrocannabinol: POSITIVE — AB

## 2013-06-22 LAB — CBC WITH DIFFERENTIAL/PLATELET
Basophils Absolute: 0 10*3/uL (ref 0.0–0.1)
Basophils Relative: 0 % (ref 0–1)
EOS PCT: 2 % (ref 0–5)
Eosinophils Absolute: 0.2 10*3/uL (ref 0.0–0.7)
HCT: 37 % (ref 36.0–46.0)
HEMOGLOBIN: 13 g/dL (ref 12.0–15.0)
LYMPHS ABS: 3.5 10*3/uL (ref 0.7–4.0)
LYMPHS PCT: 35 % (ref 12–46)
MCH: 29.3 pg (ref 26.0–34.0)
MCHC: 35.1 g/dL (ref 30.0–36.0)
MCV: 83.5 fL (ref 78.0–100.0)
MONO ABS: 0.9 10*3/uL (ref 0.1–1.0)
MONOS PCT: 9 % (ref 3–12)
Neutro Abs: 5.2 10*3/uL (ref 1.7–7.7)
Neutrophils Relative %: 53 % (ref 43–77)
Platelets: 379 10*3/uL (ref 150–400)
RBC: 4.43 MIL/uL (ref 3.87–5.11)
RDW: 13.4 % (ref 11.5–15.5)
WBC: 9.8 10*3/uL (ref 4.0–10.5)

## 2013-06-22 LAB — COMPREHENSIVE METABOLIC PANEL
ALT: 20 U/L (ref 0–35)
AST: 30 U/L (ref 0–37)
Albumin: 4.6 g/dL (ref 3.5–5.2)
Alkaline Phosphatase: 64 U/L (ref 39–117)
BUN: 25 mg/dL — AB (ref 6–23)
CALCIUM: 9.8 mg/dL (ref 8.4–10.5)
CO2: 20 meq/L (ref 19–32)
CREATININE: 1.45 mg/dL — AB (ref 0.50–1.10)
Chloride: 103 mEq/L (ref 96–112)
GFR, EST AFRICAN AMERICAN: 53 mL/min — AB (ref >90)
GFR, EST NON AFRICAN AMERICAN: 46 mL/min — AB (ref >90)
GLUCOSE: 135 mg/dL — AB (ref 70–99)
Potassium: 4 mEq/L (ref 3.7–5.3)
Sodium: 138 mEq/L (ref 137–147)
Total Bilirubin: 0.5 mg/dL (ref 0.3–1.2)
Total Protein: 9.1 g/dL — ABNORMAL HIGH (ref 6.0–8.3)

## 2013-06-22 LAB — ETHANOL

## 2013-06-22 LAB — POC URINE PREG, ED: Preg Test, Ur: NEGATIVE

## 2013-06-22 MED ORDER — CLONAZEPAM 1 MG PO TABS
1.0000 mg | ORAL_TABLET | Freq: Once | ORAL | Status: AC
  Administered 2013-06-22: 1 mg via ORAL
  Filled 2013-06-22: qty 1

## 2013-06-22 MED ORDER — ARIPIPRAZOLE 10 MG PO TABS
10.0000 mg | ORAL_TABLET | Freq: Every day | ORAL | Status: DC
  Administered 2013-06-23: 10 mg via ORAL
  Filled 2013-06-22 (×2): qty 1

## 2013-06-22 MED ORDER — TRAZODONE HCL 50 MG PO TABS
50.0000 mg | ORAL_TABLET | Freq: Every evening | ORAL | Status: DC | PRN
  Administered 2013-06-23: 50 mg via ORAL
  Filled 2013-06-22: qty 1

## 2013-06-22 MED ORDER — OLANZAPINE 10 MG PO TBDP
10.0000 mg | ORAL_TABLET | Freq: Once | ORAL | Status: AC
  Administered 2013-06-22: 10 mg via ORAL
  Filled 2013-06-22: qty 1

## 2013-06-22 MED ORDER — NICOTINE 21 MG/24HR TD PT24: 21.0000 mg | MEDICATED_PATCH | Freq: Every day | TRANSDERMAL | Status: DC

## 2013-06-22 MED ORDER — ALUM & MAG HYDROXIDE-SIMETH 200-200-20 MG/5ML PO SUSP: 30.0000 mL | ORAL | Status: DC | PRN

## 2013-06-22 MED ORDER — LORAZEPAM 1 MG PO TABS
1.0000 mg | ORAL_TABLET | Freq: Three times a day (TID) | ORAL | Status: DC | PRN
  Administered 2013-06-22 – 2013-06-23 (×3): 1 mg via ORAL
  Filled 2013-06-22 (×3): qty 1

## 2013-06-22 MED ORDER — ZOLPIDEM TARTRATE 5 MG PO TABS: 5.0000 mg | ORAL_TABLET | Freq: Every evening | ORAL | Status: DC | PRN

## 2013-06-22 MED ORDER — ACETAMINOPHEN 325 MG PO TABS: 650.0000 mg | ORAL_TABLET | ORAL | Status: DC | PRN

## 2013-06-22 MED ORDER — ONDANSETRON HCL 4 MG PO TABS: 4.0000 mg | ORAL_TABLET | Freq: Three times a day (TID) | ORAL | Status: DC | PRN

## 2013-06-22 MED ORDER — ARIPIPRAZOLE 10 MG PO TABS
10.0000 mg | ORAL_TABLET | Freq: Every day | ORAL | Status: DC
  Filled 2013-06-22: qty 1

## 2013-06-22 MED ORDER — BENZTROPINE MESYLATE 1 MG PO TABS
1.0000 mg | ORAL_TABLET | Freq: Every day | ORAL | Status: DC
  Administered 2013-06-23: 1 mg via ORAL
  Filled 2013-06-22: qty 1

## 2013-06-22 MED ORDER — IBUPROFEN 200 MG PO TABS: 600.0000 mg | ORAL_TABLET | Freq: Three times a day (TID) | ORAL | Status: DC | PRN

## 2013-06-22 NOTE — ED Provider Notes (Signed)
CSN: [id number]     Arrival date & time 06/22/13  1041 History   First MD Initiated Contact with Patient 06/22/13 1123     Chief Complaint  Patient presents with  . Medical Clearance     (Consider location/radiation/quality/duration/timing/severity/associated sxs/prior Treatment) HPI  38 year old female with history of obsessive-compulsive disorder, bipolar, anxiety who was brought here for further management of the manic episode. History was difficult as patient is not cooperative. Patient reports she ran out of her medication for the past 3 days, medication included Depakote and trazodone. She admits that she is having a manic episode. Reports feeling anxious. Denies HI, SI, or hallucination. Denies any recent alcohol use but does admits to smoking marijuana 3 days ago. Denies having any pain or discomfort. No other complaints. Her mother is in the room.  Past Medical History  Diagnosis Date  . Bipolar affective   . OCD (obsessive compulsive disorder)   . Anxiety    No past surgical history on file. Family History  Problem Relation Age of Onset  . Mental illness Neg Hx    History  Substance Use Topics  . Smoking status: Current Every Day Smoker -- 0.25 packs/day    Types: Cigarettes  . Smokeless tobacco: Not on file  . Alcohol Use: Yes   OB History   Grav Para Term Preterm Abortions TAB SAB Ect Mult Living                 Review of Systems  Respiratory: Negative for shortness of breath.   Cardiovascular: Negative for chest pain.  Gastrointestinal: Negative for abdominal pain.  Genitourinary: Negative for dysuria.  Musculoskeletal: Negative for back pain.  Skin: Negative for wound.  Neurological: Negative for headaches.  Psychiatric/Behavioral: Positive for dysphoric mood. Negative for suicidal ideas and hallucinations. The patient is hyperactive.   All other systems reviewed and are negative.     Allergies  Review of patient's allergies indicates no known  allergies.  Home Medications   Prior to Admission medications   Medication Sig Start Date End Date Taking? Authorizing Provider  ARIPiprazole (ABILIFY) 10 MG tablet Take 1 tablet (10 mg total) by mouth at bedtime. 12/31/12  Yes Fransisca KaufmannLaura Davis, NP  traZODone (DESYREL) 50 MG tablet Take 1 tablet (50 mg total) by mouth at bedtime as needed for sleep. 12/31/12  Yes Fransisca KaufmannLaura Davis, NP   BP 126/75  Pulse 98  Temp(Src) 98.1 F (36.7 C) (Oral)  Resp 16  SpO2 100%  LMP 01/22/2013 Physical Exam  Nursing note and vitals reviewed. Constitutional: She is oriented to person, place, and time. She appears well-developed and well-nourished. No distress.  HENT:  Head: Atraumatic.  Eyes: Conjunctivae are normal.  Neck: Neck supple.  Cardiovascular: Normal rate and regular rhythm.   Pulmonary/Chest: Effort normal and breath sounds normal.  Abdominal: Soft. There is no tenderness.  Musculoskeletal: She exhibits no edema.  Neurological: She is alert and oriented to person, place, and time. GCS eye subscore is 4. GCS verbal subscore is 5. GCS motor subscore is 6.  Skin: No rash noted.  Psychiatric: Her affect is inappropriate. Her speech is rapid and/or pressured. She is agitated and hyperactive. Thought content is not paranoid. She expresses impulsivity. She expresses no homicidal and no suicidal ideation.    ED Course  Procedures (including critical care time)  11:54 AM Pt with hx of bipolar, not on her meds, having manic state.  No SI/HI/hallucination.  Need psychiatric management.  Will consult TTS.  2:33 PM Pt has been evaluated by TTS and got accepted for inpt treatment.  No bed available at this time.  Will need IVC paper and placement.    Labs Review Labs Reviewed  COMPREHENSIVE METABOLIC PANEL - Abnormal; Notable for the following:    Glucose, Bld 135 (*)    BUN 25 (*)    Creatinine, Ser 1.45 (*)    Total Protein 9.1 (*)    GFR calc non Af Amer 46 (*)    GFR calc Af Amer 53 (*)    All  other components within normal limits  URINE RAPID DRUG SCREEN (HOSP PERFORMED) - Abnormal; Notable for the following:    Tetrahydrocannabinol POSITIVE (*)    All other components within normal limits  CBC WITH DIFFERENTIAL  ETHANOL  POC URINE PREG, ED    Imaging Review No results found.   EKG Interpretation None      MDM   Final diagnoses:  Manic behavior  Cannabis abuse  Schizoaffective disorder, unspecified condition    BP 126/75  Pulse 98  Temp(Src) 98.1 F (36.7 C) (Oral)  Resp 16  SpO2 100%  LMP 01/22/2013     Fayrene HelperBowie Jeraldine Primeau, PA-C 06/22/13 2013

## 2013-06-22 NOTE — Consult Note (Signed)
[facility] Consult   Reason for Consult:  Mania, schizoaffective disorder Referring Physician:  ED MD  [name] is an 38 y.o. female. Total Time spent with patient: 20 minutes  Assessment: AXIS I:  Obsessive Compulsive Disorder and Schizoaffective Disorder AXIS II:  Deferred AXIS III:   Past Medical History  Diagnosis Date  . Bipolar affective   . OCD (obsessive compulsive disorder)   . Anxiety    AXIS IV:  other psychosocial or environmental problems, problems related to social environment and problems with primary support group AXIS V:  21-30 behavior considerably influenced by delusions or hallucinations OR serious impairment in judgment, communication OR inability to function in almost all areas  Plan:  Recommend psychiatric Inpatient admission when medically cleared.  Subjective:   [name] is a 38 y.o. female patient admitted with mania, schizoaffective disorder.  HPI:  Patient agitated, irritable, increased psychomotor agitation, pressured speech, cursing.  She denies suicidal/homicidal ideations and hallucinations, states her mother "lied on her" but admits she came here for mania.  She denies issues sleeping or eating or taking any medication but Trazodone for sleep.  [name] did report drinking and smoking marijuana daily, no other drug use.  Her alcohol level was negligible.  "I'm crazy, been crazy, always going be crazy." HPI Elements:   Location:  generalized. Quality:  acute. Severity:  severe. Timing:  constant. Duration:  past week. Context:  stressors, not taking her medications.  Past Psychiatric History: Past Medical History  Diagnosis Date  . Bipolar affective   . OCD (obsessive compulsive disorder)   . Anxiety     reports that she has been smoking Cigarettes.  She has been smoking about 0.25 packs per day. She does not have any smokeless tobacco history on file. She reports that she drinks alcohol. She reports that she uses  illicit drugs (Marijuana). Family History  Problem Relation Age of Onset  . Mental illness Neg Hx    Family History Substance Abuse: Yes, Describe: ("both sides") Family Supports: Yes, List: (mother) Living Arrangements: Alone Can pt return to current living arrangement?: Yes Abuse/Neglect [facility]) Physical Abuse: Denies Verbal Abuse: Denies Sexual Abuse: Denies Allergies:  No Known Allergies  ACT Assessment Complete:  Yes:    Educational Status    Risk to Self: Risk to self Suicidal Ideation: No Suicidal Intent: No Is patient at risk for suicide?: No Suicidal Plan?: No Access to Means: No What has been your use of drugs/alcohol within the last 12 months?:  (uses marijuana weekly) Previous Attempts/Gestures: No Other Self Harm Risks:  (none known) Intentional Self Injurious Behavior: None Family Suicide History: Unknown Recent stressful life event(s): Financial Problems Persecutory voices/beliefs?: No Depression: Yes Depression Symptoms: Despondent;Isolating;Fatigue;Guilt;Loss of interest in usual pleasures;Feeling worthless/self pity;Feeling angry/irritable Substance abuse history and/or treatment for substance abuse?: Yes Suicide prevention information given to non-admitted patients: Not applicable  Risk to Others: Risk to Others Homicidal Ideation: No Thoughts of Harm to Others: No Current Homicidal Intent: No Current Homicidal Plan: No Access to Homicidal Means: No History of harm to others?: No Assessment of Violence: None Noted Criminal Charges Pending?: No Does patient have a court date: No  Abuse: Abuse/Neglect Assessment (Assessment to be complete while patient is alone) Physical Abuse: Denies Verbal Abuse: Denies Sexual Abuse: Denies Exploitation of patient/patient's resources: Denies Self-Neglect: Denies  Prior Inpatient Therapy: Prior Inpatient Therapy Prior Inpatient Therapy: Yes Prior Therapy Dates:  (unknown) Prior Therapy Facilty/Provider(s):  ([place],  multiple facilities) Reason for Treatment:  (  bipolar)  Prior Outpatient Therapy: Prior Outpatient Therapy Prior Outpatient Therapy: Yes Prior Therapy Dates: unknown-present Prior Therapy Facilty/Provider(s): [facility] Reason for Treatment: bipolar  Additional Information: Additional Information 1:1 In Past 12 Months?: No CIRT Risk: Yes Elopement Risk: Yes Does patient have medical clearance?: Yes                  Objective: Blood pressure 126/75, pulse 98, temperature 98.1 F (36.7 C), temperature source Oral, resp. rate 16, last menstrual period 01/22/2013, SpO2 100.00%.There is no weight on file to calculate BMI. Results for orders placed during the hospital encounter of 06/22/13 (from the past 72 hour(s))  CBC WITH DIFFERENTIAL     Status: None   Collection Time    06/22/13 11:25 AM      Result Value Ref Range   WBC 9.8  4.0 - 10.5 K/uL   RBC 4.43  3.87 - 5.11 MIL/uL   Hemoglobin 13.0  12.0 - 15.0 g/dL   HCT 37.0  36.0 - 46.0 %   MCV 83.5  78.0 - 100.0 fL   MCH 29.3  26.0 - 34.0 pg   MCHC 35.1  30.0 - 36.0 g/dL   RDW 13.4  11.5 - 15.5 %   Platelets 379  150 - 400 K/uL   Neutrophils Relative % 53  43 - 77 %   Neutro Abs 5.2  1.7 - 7.7 K/uL   Lymphocytes Relative 35  12 - 46 %   Lymphs Abs 3.5  0.7 - 4.0 K/uL   Monocytes Relative 9  3 - 12 %   Monocytes Absolute 0.9  0.1 - 1.0 K/uL   Eosinophils Relative 2  0 - 5 %   Eosinophils Absolute 0.2  0.0 - 0.7 K/uL   Basophils Relative 0  0 - 1 %   Basophils Absolute 0.0  0.0 - 0.1 K/uL  COMPREHENSIVE METABOLIC PANEL     Status: Abnormal   Collection Time    06/22/13 11:25 AM      Result Value Ref Range   Sodium 138  137 - 147 mEq/L   Potassium 4.0  3.7 - 5.3 mEq/L   Chloride 103  96 - 112 mEq/L   CO2 20  19 - 32 mEq/L   Glucose, Bld 135 (*) 70 - 99 mg/dL   BUN 25 (*) 6 - 23 mg/dL   Creatinine, Ser 1.45 (*) 0.50 - 1.10 mg/dL   Calcium 9.8  8.4 - 10.5 mg/dL   Total Protein 9.1 (*) 6.0 - 8.3 g/dL    Albumin 4.6  3.5 - 5.2 g/dL   AST 30  0 - 37 U/L   ALT 20  0 - 35 U/L   Alkaline Phosphatase 64  39 - 117 U/L   Total Bilirubin 0.5  0.3 - 1.2 mg/dL   GFR calc non Af Amer 46 (*) >90 mL/min   GFR calc Af Amer 53 (*) >90 mL/min   Comment: (NOTE)     The eGFR has been calculated using the CKD EPI equation.     This calculation has not been validated in all clinical situations.     eGFR's persistently <90 mL/min signify possible Chronic Kidney     Disease.  ETHANOL     Status: None   Collection Time    06/22/13 11:25 AM      Result Value Ref Range   Alcohol, Ethyl (B) <11  0 - 11 mg/dL   Comment:  LOWEST DETECTABLE LIMIT FOR     SERUM ALCOHOL IS 11 mg/dL     FOR MEDICAL PURPOSES ONLY  URINE RAPID DRUG SCREEN (HOSP PERFORMED)     Status: Abnormal   Collection Time    06/22/13 11:43 AM      Result Value Ref Range   Opiates NONE DETECTED  NONE DETECTED   Cocaine NONE DETECTED  NONE DETECTED   Benzodiazepines NONE DETECTED  NONE DETECTED   Amphetamines NONE DETECTED  NONE DETECTED   Tetrahydrocannabinol POSITIVE (*) NONE DETECTED   Barbiturates NONE DETECTED  NONE DETECTED   Comment:            DRUG SCREEN FOR MEDICAL PURPOSES     ONLY.  IF CONFIRMATION IS NEEDED     FOR ANY PURPOSE, NOTIFY LAB     WITHIN 5 DAYS.                LOWEST DETECTABLE LIMITS     FOR URINE DRUG SCREEN     Drug Class       Cutoff (ng/mL)     Amphetamine      1000     Barbiturate      200     Benzodiazepine   200     Tricyclics       300     Opiates          300     Cocaine          300     THC              50  POC URINE PREG, ED     Status: None   Collection Time    06/22/13 11:52 AM      Result Value Ref Range   Preg Test, Ur NEGATIVE  NEGATIVE   Comment:            THE SENSITIVITY OF THIS     METHODOLOGY IS >24 mIU/mL   Labs are reviewed and are pertinent for no medical issues.  Current Facility-Administered Medications  Medication Dose Route Frequency Provider Last Rate Last  Dose  . acetaminophen (TYLENOL) tablet 650 mg  650 mg Oral Q4H PRN Fayrene Helper, PA-C      . alum & mag hydroxide-simeth (MAALOX/MYLANTA) 200-200-20 MG/5ML suspension 30 mL  30 mL Oral PRN Fayrene Helper, PA-C      . ARIPiprazole (ABILIFY) tablet 10 mg  10 mg Oral QHS Fayrene Helper, PA-C      . benztropine (COGENTIN) tablet 1 mg  1 mg Oral QHS Nanine Means, NP      . ibuprofen (ADVIL,MOTRIN) tablet 600 mg  600 mg Oral Q8H PRN Fayrene Helper, PA-C      . LORazepam (ATIVAN) tablet 1 mg  1 mg Oral Q8H PRN Fayrene Helper, PA-C   1 mg at 06/22/13 1257  . nicotine (NICODERM CQ - dosed in mg/24 hours) patch 21 mg  21 mg Transdermal Daily Fayrene Helper, PA-C      . ondansetron [facility]) tablet 4 mg  4 mg Oral Q8H PRN Fayrene Helper, PA-C      . traZODone (DESYREL) tablet 50 mg  50 mg Oral QHS PRN Fayrene Helper, PA-C      . zolpidem (AMBIEN) tablet 5 mg  5 mg Oral QHS PRN Fayrene Helper, PA-C       Current Outpatient Prescriptions  Medication Sig Dispense Refill  . ARIPiprazole (ABILIFY) 10 MG tablet Take 1 tablet (10 mg total)  by mouth at bedtime.  30 tablet  0  . traZODone (DESYREL) 50 MG tablet Take 1 tablet (50 mg total) by mouth at bedtime as needed for sleep.  30 tablet  0    Psychiatric Specialty Exam:     Blood pressure 126/75, pulse 98, temperature 98.1 F (36.7 C), temperature source Oral, resp. rate 16, last menstrual period 01/22/2013, SpO2 100.00%.There is no weight on file to calculate BMI.  General Appearance: Casual  Eye Contact::  Fair  Speech:  Pressured  Volume:  Increased  Mood:  Angry, Anxious and Irritable  Affect:  Labile  Thought Process:  Irrelevant  Orientation:  Full (Time, Place, and Person)  Thought Content:  Delusions, Paranoid Ideation and Rumination  Suicidal Thoughts:  No  Homicidal Thoughts:  No  Memory:  Immediate;   Fair Recent;   Fair Remote;   Fair  Judgement:  Poor  Insight:  Lacking  Psychomotor Activity:  Increased  Concentration:  Poor  Recall:  [employer] of Knowledge:Fair   Language: Good  Akathisia:  No  Handed:  Right  AIMS (if indicated):     Assets:  Housing Leisure Time Physical Health Resilience Social Support  Sleep:      Musculoskeletal: Strength & Muscle Tone: within normal limits Gait & Station: normal Patient leans: N/A  Treatment Plan Summary: Admit to inpatient psychiatry for stabilization, home medications restarted with an increase in her Abilify from 5 mg to 10 mg at bedtime.  Waylan Boga, PMH-NP 06/22/2013 4:21 PM Pt seen face to face and I agree with treatment plan Levonne Spiller MD

## 2013-06-22 NOTE — ED Notes (Signed)
On the phone, calmer, still cursing at times, tone more normal.

## 2013-06-22 NOTE — ED Notes (Signed)
Jamison NP into see 

## 2013-06-22 NOTE — ED Notes (Signed)
Bed: WA29 Expected date:  Expected time:  Means of arrival:  Comments: TCU 

## 2013-06-22 NOTE — ED Notes (Signed)
Pt states she has been having a manic episode x 2 days.  Has not had her meds in 3 days.  No SI/HI.  Supposed to be on depakote and trazodone.

## 2013-06-22 NOTE — ED Notes (Signed)
Patient coming out to nurses station and talking to tech and nurse, being sarcastic, manipulative, and generally acting out.  Redirected patient to stay in room.  Showed her how to operate the television.  Told her she needed to stay in her room unless she needed something.  Offered food and fluids.

## 2013-06-22 NOTE — ED Notes (Signed)
Up at the desk, talking loudly, cursing, redirected back to room w/ difficulty

## 2013-06-22 NOTE — ED Notes (Signed)
Redirected back to her room w/o difficulty

## 2013-06-22 NOTE — Progress Notes (Signed)
Pts referral faxed to the following with bed availability:  [facility]- per Dewayne HatchAnn can fax [facility]- per Olegario MessierKathy can fax, referral [facility]- referral faxed [facility]- per Ladona Ridgelaylor taking [facility]errals [facility]- [facility] fax[facility]- per mary can fax   The following at capacity: [facility]- [facility]- per Tereso NewcomerMariam Davis- per Mohammed Kindleonna Rowan- per [facility] PartyBarbara only [facility]- per Lorn Junesbeth [facility]- [facility]- per Wandra ScotYvonne [facility]- [facility]- per [facility]- per [facility] only child 12 and under and 65+   [facility] Dis[facility]

## 2013-06-22 NOTE — ED Notes (Signed)
Back out at the desk, arguing w/ staff, questioning other pt, not as loud, but still difficult to redirect.  Pt back to room

## 2013-06-22 NOTE — BH Assessment (Signed)
[facility] Assessment Progress Note  Spoke with Fayrene HelperBowie Tran, PA, took history on patient and scheduled tele assessment.

## 2013-06-22 NOTE — BH Assessment (Signed)
Assessment Note  Barbara SenterChanda D [name] is an 38 y.o. female who [facility] by her mother who reports in ED notes that she was afraid of pt due to recent manic episode.  Pt says that she has been off of her meds for 3 days.  She said that she "had to kick her roommate out this morning" because he was not paying his rent, and then her mom came over and told her that she was manic and needed to go to the hospital.  Pt was uncooperative in the ED, has rapid speech with tangential thinking.  Pt goes on tangents such as talking about how society is discriminatory and lists all of the things one must do and be in order to get a job,"and I'm not going to do all of that".  Pt starts cursing and talking about how her mom is verbally abusive and "stupid" for bringing her to the hospital, and "[facility] because that is for STUPID people, but [facility] and since I don't have anything else to do....but I have to work on Monday.  Pt thinks it is Wednesday, and it is Saturday.  Pt gets outpt tx at the [facility] and has multiple admissions.  Pt denies SI/HI/A/V hallucinations or a history of violence.  She admits to marijuana use 1x/week.  Attempted to call mom to get collateral, but there was no answer and no voicemeail/  To stabilize mania, IP is recommended by Nanine MeansJamison Lord, NP.  IVC will be initiated, and [facility], outside placement will be sought.    Axis I: Bipolar, Manic Axis II: Deferred Axis III:  Past Medical History  Diagnosis Date  . Bipolar affective   . OCD (obsessive compulsive disorder)   . Anxiety    Axis IV: economic problems and problems related to legal system/crime Axis V: 31-40 impairment in reality testing  Past Medical History:  Past Medical History  Diagnosis Date  . Bipolar affective   . OCD (obsessive compulsive disorder)   . Anxiety     No past surgical history on file.  Family History:  Family History   Problem Relation Age of Onset  . Mental illness Neg Hx     Social History:  reports that she has been smoking Cigarettes.  She has been smoking about 0.25 packs per day. She does not have any smokeless tobacco history on file. She reports that she drinks alcohol. She reports that she uses illicit drugs (Marijuana).  Additional Social History:  Alcohol / Drug Use Pain Medications: denies Prescriptions: denies Over the Counter: denies History of alcohol / drug use?: Yes Longest period of sobriety (when/how long): none Negative Consequences of Use:  (denies) Withdrawal Symptoms:  (denies) Substance #1 Name of Substance 1: marijuana 1 - Age of First Use: 16 1 - Amount (size/oz): unknown 1 - Frequency: 1x/week 1 - Duration: years  CIWA: CIWA-Ar BP: 126/75 mmHg Pulse Rate: 98 COWS:    Allergies: No Known Allergies  Home Medications:  (Not in a hospital admission)  OB/GYN Status:  Patient's last menstrual period was 01/22/2013.  General Assessment Data Location of Assessment: [facility] ED Is this a Tele or Face-to-Face Assessment?: Tele Assessment Is this an Initial Assessment or a Re-assessment for this encounter?: Initial Assessment Living Arrangements: Alone Can pt return to current living arrangement?: Yes Admission Status: Voluntary Is patient capable of signing voluntary admission?: Yes Transfer from:  Home Referral Source: Self/Family/Friend     [facility] Living Arrangements: Alone  Education Status Is patient currently in school?: No  Risk to self Suicidal Ideation: No Suicidal Intent: No Is patient at risk for suicide?: No Suicidal Plan?: No Access to Means: No What has been your use of drugs/alcohol within the last 12 months?:  (uses marijuana weekly) Previous Attempts/Gestures: No Other Self Harm Risks:  (none known) Intentional Self Injurious Behavior: None Family Suicide History: Unknown Recent stressful life event(s): Financial  Problems Persecutory voices/beliefs?: No Depression: Yes Depression Symptoms: Despondent;Isolating;Fatigue;Guilt;Loss of interest in usual pleasures;Feeling worthless/self pity;Feeling angry/irritable Substance abuse history and/or treatment for substance abuse?: Yes Suicide prevention information given to non-admitted patients: Not applicable  Risk to Others Homicidal Ideation: No Thoughts of Harm to Others: No Current Homicidal Intent: No Current Homicidal Plan: No Access to Homicidal Means: No History of harm to others?: No Assessment of Violence: None Noted Criminal Charges Pending?: No Does patient have a court date: No  Psychosis Hallucinations: None noted Delusions: None noted  Mental Status Report Appear/Hygiene:  (Casual) Eye Contact: Good Motor Activity: Restlessness;Hyperactivity Speech: Rapid;Pressured;Argumentative Level of Consciousness: Alert Mood: Irritable;Suspicious Affect: Irritable;Angry Anxiety Level: Moderate Thought Processes: Tangential;Flight of Ideas Judgement: Unimpaired Orientation: Person;Place;Situation Obsessive Compulsive Thoughts/Behaviors: Moderate  Cognitive Functioning Concentration: Decreased Memory: Recent Intact;Remote Intact IQ: Average Insight: Poor Impulse Control: Poor Appetite: Fair Weight Loss: 0 Weight Gain: 50 (in past year) Sleep: Decreased Total Hours of Sleep: 8 Vegetative Symptoms: None  ADLScreening [facility]) Patient's cognitive ability adequate to safely complete daily activities?: Yes Patient able to express need for assistance with ADLs?: Yes Independently performs ADLs?: Yes (appropriate for developmental age)  Prior Inpatient Therapy Prior Inpatient Therapy: Yes Prior Therapy Dates:  (unknown) Prior Therapy Facilty/Provider(s):  ([facility], multiple facilities) Reason for Treatment:  (bipolar)  Prior Outpatient Therapy Prior Outpatient Therapy: Yes Prior Therapy Dates:  unknown-present Prior Therapy Facilty/Provider(s): [facility] Reason for Treatment: bipolar  ADL Screening (condition at time of admission) Patient's cognitive ability adequate to safely complete daily activities?: Yes Is the patient deaf or have difficulty hearing?: No Does the patient have difficulty seeing, even when wearing glasses/contacts?: No Does the patient have difficulty concentrating, remembering, or making decisions?: No Patient able to express need for assistance with ADLs?: Yes Does the patient have difficulty dressing or bathing?: No Independently performs ADLs?: Yes (appropriate for developmental age) Does the patient have difficulty walking or climbing stairs?: No  Home Assistive Devices/Equipment Home Assistive Devices/Equipment: None  Therapy Consults (therapy consults require a physician order) PT Evaluation Needed: No OT Evalulation Needed: No SLP Evaluation Needed: No Abuse/Neglect Assessment (Assessment to be complete while patient is alone) Physical Abuse: Denies Verbal Abuse: Denies Sexual Abuse: Denies Exploitation of patient/patient's resources: Denies Self-Neglect: Denies Values / Beliefs Cultural Requests During Hospitalization: None Spiritual Requests During Hospitalization: None Consults Spiritual Care Consult Needed: No Social Work Consult Needed: No Merchant navy officerAdvance Directives (For Healthcare) Advance Directive: Patient does not have advance directive Pre-existing out of facility DNR order (yellow form or pink MOST form): No    Additional Information 1:1 In Past 12 Months?: No CIRT Risk: Yes Elopement Risk: Yes Does patient have medical clearance?: Yes     Disposition:  Disposition Initial Assessment Completed for this Encounter: Yes Disposition of Patient: Inpatient treatment program Type of inpatient treatment program: Adult  On Site Evaluation by:   Reviewed with Physician:    Theo DillsEmily Hines Kevontay Burks 06/22/2013 2:09 PM

## 2013-06-23 ENCOUNTER — Encounter (HOSPITAL_COMMUNITY): Payer: Self-pay | Admitting: Registered Nurse

## 2013-06-23 DIAGNOSIS — F429 Obsessive-compulsive disorder, unspecified: Secondary | ICD-10-CM

## 2013-06-23 DIAGNOSIS — F259 Schizoaffective disorder, unspecified: Secondary | ICD-10-CM | POA: Diagnosis not present

## 2013-06-23 MED ORDER — ARIPIPRAZOLE 10 MG PO TABS: 10.0000 mg | ORAL_TABLET | Freq: Every day | ORAL | Status: DC

## 2013-06-23 NOTE — ED Notes (Signed)
Dr ross and shuvon np into see 

## 2013-06-23 NOTE — ED Notes (Addendum)
Written dc instructions reviewed w/ pt.  Pt encouraged to follow up at the [facility] as instructed.  Pt encouraged to take her medications as directed.  Pt verbalized understanding.  Pt's mom is on the way to pick her up.  Rx x1 given

## 2013-06-23 NOTE — ED Notes (Addendum)
Watching tv in activity room, remains calm, cooperative, eating snack
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
## 2013-06-23 NOTE — Progress Notes (Signed)
Provided info to Dr. Tenny Crawoss regarding discussion with mother of pt and communicated the mother's concerns.  Pt does not appear to meet IVC criteria and Dr. Tenny Crawoss rescinded IVC and pt will be d/c home  Pt will follow up [facility].

## 2013-06-23 NOTE — ED Notes (Signed)
IVC has been rescinded, pt to be dc'd home 
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
## 2013-06-23 NOTE — ED Notes (Signed)
Still angry w/ her mom "she put me here" loud and cursing at times, but easily redirected back to room, and lowered her tone at request.

## 2013-06-23 NOTE — ED Notes (Signed)
Pt ambulatory to dc window w/ security.  Belongings returned after leaving unit

## 2013-06-23 NOTE — ED Notes (Signed)
Up to the desk talking, argumentative, redirected back to room.

## 2013-06-23 NOTE — ED Notes (Signed)
Pt reports she tried to call her mother and she hung up on her.  Pt angry and loud, but not aggressive.

## 2013-06-23 NOTE — ED Notes (Signed)
On the phone 

## 2013-06-23 NOTE — Discharge Instructions (Signed)
Marijuana Abuse Your exam shows you have used marijuana or pot. There are many health problems related to marijuana abuse. These include:  Bronchitis.  Chronic cough.  Emphysema.  Lung and upper airway cancer. Abusers also experience impairment in:  Memory.  Judgment.  Ability to learn.  Coordination. Students who smoke marijuana:  Get lower grades.  Are less likely to graduate than those who do not. Adults who abuse marijuana:  Have problems at work.  May even lose their jobs due to:  Poor work International aid/development worker.  Absenteeism. Attention, memory, and learning skills have been shown to be diminished for up to 6 months after stopping regular use, and there is evidence that the effects can be cumulative over a lifetime.  Heavier use of marijuana also puts a strain on relationships with friends and loved ones and can lead to moodiness and loss of confidence. Acute intoxication can lead to:  Increased anxiety.  A panic episode. It also increases the risk for having an automobile accident. This is especially true if the pot is combined with alcohol or other intoxicants. Treatment for acute intoxication is rarely needed. However, medicine to reduce anxiety may be helpful in some people. Millions of people are considered to be dependent on marijuana. It is long-term regular use that leads to addiction and all of its complex problems. Information on the problem of addiction and the health problems of long-term abuse is posted at the [facility] for Drug Abuse website, http://www.price-smith.com/. Consult with your doctor or counselor if you want further information and support in handling this common problem. Document Released: 03/31/2004 Document Revised: 05/16/2011 Document Reviewed: 01/16/2007 [facility] Patient Information 2014 [place], [place].  Bipolar Disorder Bipolar disorder is a mental illness. The term bipolar disorder actually is used to describe a group of disorders that all  share varying degrees of emotional highs and lows that can interfere with daily functioning, such as work, school, or relationships. Bipolar disorder also can lead to drug abuse, hospitalization, and suicide. The emotional highs of bipolar disorder are periods of elation or irritability and high energy. These highs can range from a mild form (hypomania) to a severe form (mania). People experiencing episodes of hypomania may appear energetic, excitable, and highly productive. People experiencing mania may behave impulsively or erratically. They often make poor decisions. They may have difficulty sleeping. The most severe episodes of mania can involve having very distorted beliefs or perceptions about the world and seeing or hearing things that are not real (psychotic delusions and hallucinations).  The emotional lows of bipolar disorder (depression) also can range from mild to severe. Severe episodes of bipolar depression can involve psychotic delusions and hallucinations. Sometimes people with bipolar disorder experience a state of mixed mood. Symptoms of hypomania or mania and depression are both present during this mixed-mood episode. SIGNS AND SYMPTOMS There are signs and symptoms of the episodes of hypomania and mania as well as the episodes of depression. The signs and symptoms of hypomania and mania are similar but vary in severity. They include:  Inflated self-esteem or feeling of increased self-confidence.  Decreased need for sleep.  Unusual talkativeness (rapid or pressured speech) or the feeling of a need to keep talking.  Sensation of racing thoughts or constant talking, with quick shifts between topics that may or may not be related (flight of ideas).  Decreased ability to focus or concentrate.  Increased purposeful activity, such as work, studies, or social activity, or nonproductive activity, such as pacing, squirming and fidgeting, or  finger and toe tapping.  Impulsive behavior and  use of poor judgment, resulting in high-risk activities, such as having unprotected sex or spending excessive amounts of money. Signs and symptoms of depression include the following:   Feelings of sadness, hopelessness, or helplessness.  Frequent or uncontrollable episodes of crying.  Lack of feeling anything or caring about anything.  Difficulty sleeping or sleeping too much.  Inability to enjoy the things you used to enjoy.   Desire to be alone all the time.   Feelings of guilt or worthlessness.  Lack of energy or motivation.   Difficulty concentrating, remembering, or making decisions.  Change in appetite or weight beyond normal fluctuations.  Thoughts of death or the desire to harm yourself. DIAGNOSIS  Bipolar disorder is diagnosed through an assessment by your caregiver. Your caregiver will ask questions about your emotional episodes. There are two main types of bipolar disorder. People with type I bipolar disorder have manic episodes with or without depressive episodes. People with type II bipolar disorder have hypomanic episodes and major depressive episodes, which are more serious than mild depression. The type of bipolar disorder you have can make an important difference in how your illness is monitored and treated. Your caregiver may ask questions about your medical history and use of alcohol or drugs, including prescription medication. Certain medical conditions and substances also can cause emotional highs and lows that resemble bipolar disorder (secondary bipolar disorder).  TREATMENT  Bipolar disorder is a long-term illness. It is best controlled with continuous treatment rather than treatment only when symptoms occur. The following treatments can be prescribed for bipolar disorders:  Medication Medication can be prescribed by a doctor that is an expert in treating mental disorders (psychiatrists). Medications called mood stabilizers are usually prescribed to help  control the illness. Other medications are sometimes added if symptoms of mania, depression, or psychotic delusions and hallucinations occur despite the use of a mood stabilizer.  Talk therapy Some forms of talk therapy are helpful in providing support, education, and guidance. A combination of medication and talk therapy is best for managing the disorder over time. A procedure in which electricity is applied to your brain through your scalp (electroconvulsive therapy) is used in cases of severe mania when medication and talk therapy do not work or work too slowly. Document Released: 05/30/2000 Document Revised: 06/18/2012 Document Reviewed: 03/19/2012 [facility] Patient Information 2014 [place], [place].  Bipolar Disorder Bipolar disorder is a mental illness. The term bipolar disorder actually is used to describe a group of disorders that all share varying degrees of emotional highs and lows that can interfere with daily functioning, such as work, school, or relationships. Bipolar disorder also can lead to drug abuse, hospitalization, and suicide. The emotional highs of bipolar disorder are periods of elation or irritability and high energy. These highs can range from a mild form (hypomania) to a severe form (mania). People experiencing episodes of hypomania may appear energetic, excitable, and highly productive. People experiencing mania may behave impulsively or erratically. They often make poor decisions. They may have difficulty sleeping. The most severe episodes of mania can involve having very distorted beliefs or perceptions about the world and seeing or hearing things that are not real (psychotic delusions and hallucinations).  The emotional lows of bipolar disorder (depression) also can range from mild to severe. Severe episodes of bipolar depression can involve psychotic delusions and hallucinations. Sometimes people with bipolar disorder experience a state of mixed mood. Symptoms of hypomania or  mania and  depression are both present during this mixed-mood episode. SIGNS AND SYMPTOMS There are signs and symptoms of the episodes of hypomania and mania as well as the episodes of depression. The signs and symptoms of hypomania and mania are similar but vary in severity. They include:  Inflated self-esteem or feeling of increased self-confidence.  Decreased need for sleep.  Unusual talkativeness (rapid or pressured speech) or the feeling of a need to keep talking.  Sensation of racing thoughts or constant talking, with quick shifts between topics that may or may not be related (flight of ideas).  Decreased ability to focus or concentrate.  Increased purposeful activity, such as work, studies, or social activity, or nonproductive activity, such as pacing, squirming and fidgeting, or finger and toe tapping.  Impulsive behavior and use of poor judgment, resulting in high-risk activities, such as having unprotected sex or spending excessive amounts of money. Signs and symptoms of depression include the following:   Feelings of sadness, hopelessness, or helplessness.  Frequent or uncontrollable episodes of crying.  Lack of feeling anything or caring about anything.  Difficulty sleeping or sleeping too much.  Inability to enjoy the things you used to enjoy.   Desire to be alone all the time.   Feelings of guilt or worthlessness.  Lack of energy or motivation.   Difficulty concentrating, remembering, or making decisions.  Change in appetite or weight beyond normal fluctuations.  Thoughts of death or the desire to harm yourself. DIAGNOSIS  Bipolar disorder is diagnosed through an assessment by your caregiver. Your caregiver will ask questions about your emotional episodes. There are two main types of bipolar disorder. People with type I bipolar disorder have manic episodes with or without depressive episodes. People with type II bipolar disorder have hypomanic episodes and  major depressive episodes, which are more serious than mild depression. The type of bipolar disorder you have can make an important difference in how your illness is monitored and treated. Your caregiver may ask questions about your medical history and use of alcohol or drugs, including prescription medication. Certain medical conditions and substances also can cause emotional highs and lows that resemble bipolar disorder (secondary bipolar disorder).  TREATMENT  Bipolar disorder is a long-term illness. It is best controlled with continuous treatment rather than treatment only when symptoms occur. The following treatments can be prescribed for bipolar disorders:  Medication Medication can be prescribed by a doctor that is an expert in treating mental disorders (psychiatrists). Medications called mood stabilizers are usually prescribed to help control the illness. Other medications are sometimes added if symptoms of mania, depression, or psychotic delusions and hallucinations occur despite the use of a mood stabilizer.  Talk therapy Some forms of talk therapy are helpful in providing support, education, and guidance. A combination of medication and talk therapy is best for managing the disorder over time. A procedure in which electricity is applied to your brain through your scalp (electroconvulsive therapy) is used in cases of severe mania when medication and talk therapy do not work or work too slowly. Document Released: 05/30/2000 Document Revised: 06/18/2012 Document Reviewed: 03/19/2012 [facility] Patient Information 2014 [place].  Bipolar Disorder Bipolar disorder is a mental illness. The term bipolar disorder actually is used to describe a group of disorders that all share varying degrees of emotional highs and lows that can interfere with daily functioning, such as work, school, or relationships. Bipolar disorder also can lead to drug abuse, hospitalization, and suicide. The emotional  highs of bipolar disorder are  periods of elation or irritability and high energy. These highs can range from a mild form (hypomania) to a severe form (mania). People experiencing episodes of hypomania may appear energetic, excitable, and highly productive. People experiencing mania may behave impulsively or erratically. They often make poor decisions. They may have difficulty sleeping. The most severe episodes of mania can involve having very distorted beliefs or perceptions about the world and seeing or hearing things that are not real (psychotic delusions and hallucinations).  The emotional lows of bipolar disorder (depression) also can range from mild to severe. Severe episodes of bipolar depression can involve psychotic delusions and hallucinations. Sometimes people with bipolar disorder experience a state of mixed mood. Symptoms of hypomania or mania and depression are both present during this mixed-mood episode. SIGNS AND SYMPTOMS There are signs and symptoms of the episodes of hypomania and mania as well as the episodes of depression. The signs and symptoms of hypomania and mania are similar but vary in severity. They include:  Inflated self-esteem or feeling of increased self-confidence.  Decreased need for sleep.  Unusual talkativeness (rapid or pressured speech) or the feeling of a need to keep talking.  Sensation of racing thoughts or constant talking, with quick shifts between topics that may or may not be related (flight of ideas).  Decreased ability to focus or concentrate.  Increased purposeful activity, such as work, studies, or social activity, or nonproductive activity, such as pacing, squirming and fidgeting, or finger and toe tapping.  Impulsive behavior and use of poor judgment, resulting in high-risk activities, such as having unprotected sex or spending excessive amounts of money. Signs and symptoms of depression include the following:   Feelings of sadness, hopelessness,  or helplessness.  Frequent or uncontrollable episodes of crying.  Lack of feeling anything or caring about anything.  Difficulty sleeping or sleeping too much.  Inability to enjoy the things you used to enjoy.   Desire to be alone all the time.   Feelings of guilt or worthlessness.  Lack of energy or motivation.   Difficulty concentrating, remembering, or making decisions.  Change in appetite or weight beyond normal fluctuations.  Thoughts of death or the desire to harm yourself. DIAGNOSIS  Bipolar disorder is diagnosed through an assessment by your caregiver. Your caregiver will ask questions about your emotional episodes. There are two main types of bipolar disorder. People with type I bipolar disorder have manic episodes with or without depressive episodes. People with type II bipolar disorder have hypomanic episodes and major depressive episodes, which are more serious than mild depression. The type of bipolar disorder you have can make an important difference in how your illness is monitored and treated. Your caregiver may ask questions about your medical history and use of alcohol or drugs, including prescription medication. Certain medical conditions and substances also can cause emotional highs and lows that resemble bipolar disorder (secondary bipolar disorder).  TREATMENT  Bipolar disorder is a long-term illness. It is best controlled with continuous treatment rather than treatment only when symptoms occur. The following treatments can be prescribed for bipolar disorders:  Medication Medication can be prescribed by a doctor that is an expert in treating mental disorders (psychiatrists). Medications called mood stabilizers are usually prescribed to help control the illness. Other medications are sometimes added if symptoms of mania, depression, or psychotic delusions and hallucinations occur despite the use of a mood stabilizer.  Talk therapy Some forms of talk therapy are  helpful in providing support, education, and guidance. A  combination of medication and talk therapy is best for managing the disorder over time. A procedure in which electricity is applied to your brain through your scalp (electroconvulsive therapy) is used in cases of severe mania when medication and talk therapy do not work or work too slowly. Document Released: 05/30/2000 Document Revised: 06/18/2012 Document Reviewed: 03/19/2012 [facility] Patient Information 2014 [place], [place].

## 2013-06-23 NOTE — ED Notes (Signed)
Watching tv in activity room, Pt's mom to pick her up around 300pm

## 2013-06-25 NOTE — ED Provider Notes (Signed)
Medical screening examination/treatment/procedure(s) were conducted as a shared visit with non-physician practitioner(s) and myself.  I personally evaluated the patient during the encounter.   EKG Interpretation None      Pt with hx of bipolar d/o, now with manic behavior, combative in the ED.  Psych to see  Rolan BuccoMelanie Socorro Kanitz, MD 06/25/13 [phone]

## 2013-07-11 DIAGNOSIS — F314 Bipolar disorder, current episode depressed, severe, without psychotic features: Secondary | ICD-10-CM | POA: Diagnosis not present

## 2013-08-12 DIAGNOSIS — F314 Bipolar disorder, current episode depressed, severe, without psychotic features: Secondary | ICD-10-CM | POA: Diagnosis not present

## 2013-08-13 DIAGNOSIS — F314 Bipolar disorder, current episode depressed, severe, without psychotic features: Secondary | ICD-10-CM | POA: Diagnosis not present

## 2013-08-31 ENCOUNTER — Encounter (HOSPITAL_COMMUNITY): Payer: Self-pay | Admitting: Emergency Medicine

## 2013-08-31 ENCOUNTER — Emergency Department (HOSPITAL_COMMUNITY)
Admission: EM | Admit: 2013-08-31 | Discharge: 2013-09-02 | Disposition: A | Discharge status: Other Acute Inpatient Hospital | Payer: Medicare Other | Attending: Emergency Medicine | Admitting: Emergency Medicine

## 2013-08-31 DIAGNOSIS — F309 Manic episode, unspecified: Secondary | ICD-10-CM | POA: Diagnosis not present

## 2013-08-31 DIAGNOSIS — F23 Brief psychotic disorder: Secondary | ICD-10-CM

## 2013-08-31 DIAGNOSIS — F3110 Bipolar disorder, current episode manic without psychotic features, unspecified: Secondary | ICD-10-CM | POA: Insufficient documentation

## 2013-08-31 DIAGNOSIS — F17200 Nicotine dependence, unspecified, uncomplicated: Secondary | ICD-10-CM | POA: Insufficient documentation

## 2013-08-31 DIAGNOSIS — F411 Generalized anxiety disorder: Secondary | ICD-10-CM | POA: Diagnosis not present

## 2013-08-31 DIAGNOSIS — F29 Unspecified psychosis not due to a substance or known physiological condition: Secondary | ICD-10-CM | POA: Diagnosis not present

## 2013-08-31 DIAGNOSIS — R45850 Homicidal ideations: Secondary | ICD-10-CM | POA: Diagnosis not present

## 2013-08-31 DIAGNOSIS — F259 Schizoaffective disorder, unspecified: Secondary | ICD-10-CM | POA: Diagnosis present

## 2013-08-31 DIAGNOSIS — F429 Obsessive-compulsive disorder, unspecified: Secondary | ICD-10-CM | POA: Insufficient documentation

## 2013-08-31 DIAGNOSIS — Z3202 Encounter for pregnancy test, result negative: Secondary | ICD-10-CM | POA: Diagnosis not present

## 2013-08-31 DIAGNOSIS — Z79899 Other long term (current) drug therapy: Secondary | ICD-10-CM | POA: Diagnosis not present

## 2013-08-31 DIAGNOSIS — F1210 Cannabis abuse, uncomplicated: Secondary | ICD-10-CM | POA: Diagnosis present

## 2013-08-31 DIAGNOSIS — F3010 Manic episode without psychotic symptoms, unspecified: Secondary | ICD-10-CM | POA: Diagnosis present

## 2013-08-31 LAB — URINALYSIS, ROUTINE W REFLEX MICROSCOPIC
Glucose, UA: NEGATIVE mg/dL
Ketones, ur: NEGATIVE mg/dL
Leukocytes, UA: NEGATIVE
NITRITE: NEGATIVE
PH: 5 (ref 5.0–8.0)
Protein, ur: 30 mg/dL — AB
SPECIFIC GRAVITY, URINE: 1.031 — AB (ref 1.005–1.030)
Urobilinogen, UA: 1 mg/dL (ref 0.0–1.0)

## 2013-08-31 LAB — COMPREHENSIVE METABOLIC PANEL
ALT: 10 U/L (ref 0–35)
AST: 21 U/L (ref 0–37)
Albumin: 4.3 g/dL (ref 3.5–5.2)
Alkaline Phosphatase: 54 U/L (ref 39–117)
BUN: 21 mg/dL (ref 6–23)
CALCIUM: 9.2 mg/dL (ref 8.4–10.5)
CO2: 19 mEq/L (ref 19–32)
Chloride: 105 mEq/L (ref 96–112)
Creatinine, Ser: 1.28 mg/dL — ABNORMAL HIGH (ref 0.50–1.10)
GFR calc non Af Amer: 53 mL/min — ABNORMAL LOW (ref >90)
GFR, EST AFRICAN AMERICAN: 62 mL/min — AB (ref >90)
Glucose, Bld: 136 mg/dL — ABNORMAL HIGH (ref 70–99)
Potassium: 3.7 mEq/L (ref 3.7–5.3)
SODIUM: 140 meq/L (ref 137–147)
TOTAL PROTEIN: 8.6 g/dL — AB (ref 6.0–8.3)
Total Bilirubin: 0.3 mg/dL (ref 0.3–1.2)

## 2013-08-31 LAB — CBC WITH DIFFERENTIAL/PLATELET
Basophils Absolute: 0 10*3/uL (ref 0.0–0.1)
Basophils Relative: 0 % (ref 0–1)
EOS ABS: 0.1 10*3/uL (ref 0.0–0.7)
EOS PCT: 1 % (ref 0–5)
HCT: 31.1 % — ABNORMAL LOW (ref 36.0–46.0)
Hemoglobin: 10.6 g/dL — ABNORMAL LOW (ref 12.0–15.0)
Lymphocytes Relative: 37 % (ref 12–46)
Lymphs Abs: 2.8 10*3/uL (ref 0.7–4.0)
MCH: 28.6 pg (ref 26.0–34.0)
MCHC: 34.1 g/dL (ref 30.0–36.0)
MCV: 83.8 fL (ref 78.0–100.0)
Monocytes Absolute: 0.5 10*3/uL (ref 0.1–1.0)
Monocytes Relative: 7 % (ref 3–12)
NEUTROS PCT: 55 % (ref 43–77)
Neutro Abs: 4.1 10*3/uL (ref 1.7–7.7)
PLATELETS: 334 10*3/uL (ref 150–400)
RBC: 3.71 MIL/uL — ABNORMAL LOW (ref 3.87–5.11)
RDW: 13.9 % (ref 11.5–15.5)
WBC: 7.5 10*3/uL (ref 4.0–10.5)

## 2013-08-31 LAB — RAPID URINE DRUG SCREEN, HOSP PERFORMED
Amphetamines: NOT DETECTED
BARBITURATES: NOT DETECTED
BENZODIAZEPINES: NOT DETECTED
COCAINE: NOT DETECTED
OPIATES: NOT DETECTED
Tetrahydrocannabinol: NOT DETECTED

## 2013-08-31 LAB — URINE MICROSCOPIC-ADD ON

## 2013-08-31 LAB — PREGNANCY, URINE: Preg Test, Ur: NEGATIVE

## 2013-08-31 LAB — ETHANOL

## 2013-08-31 MED ORDER — LORAZEPAM 2 MG/ML IJ SOLN: 1.0000 mg | Freq: Once | INTRAMUSCULAR | Status: DC

## 2013-08-31 MED ORDER — STERILE WATER FOR INJECTION IJ SOLN
INTRAMUSCULAR | Status: AC
  Administered 2013-08-31: 1.6 mL
  Filled 2013-08-31: qty 10

## 2013-08-31 MED ORDER — ZIPRASIDONE MESYLATE 20 MG IM SOLR
10.0000 mg | Freq: Once | INTRAMUSCULAR | Status: AC
  Administered 2013-08-31: 10 mg via INTRAMUSCULAR
  Filled 2013-08-31: qty 20

## 2013-08-31 MED ORDER — LORAZEPAM 2 MG/ML IJ SOLN
1.0000 mg | Freq: Once | INTRAMUSCULAR | Status: AC
  Administered 2013-08-31: 10 mg via INTRAMUSCULAR
  Filled 2013-08-31: qty 1

## 2013-08-31 MED ORDER — LORAZEPAM 1 MG PO TABS
1.0000 mg | ORAL_TABLET | Freq: Three times a day (TID) | ORAL | Status: DC | PRN
  Administered 2013-08-31 – 2013-09-01 (×2): 1 mg via ORAL
  Filled 2013-08-31 (×3): qty 1

## 2013-08-31 MED ORDER — NICOTINE 21 MG/24HR TD PT24: 21.0000 mg | MEDICATED_PATCH | Freq: Every day | TRANSDERMAL | Status: DC

## 2013-08-31 MED ORDER — ZIPRASIDONE MESYLATE 20 MG IM SOLR
INTRAMUSCULAR | Status: AC
  Filled 2013-08-31: qty 20

## 2013-08-31 MED ORDER — IBUPROFEN 200 MG PO TABS
600.0000 mg | ORAL_TABLET | Freq: Three times a day (TID) | ORAL | Status: DC | PRN
  Administered 2013-08-31: 600 mg via ORAL
  Filled 2013-08-31: qty 3

## 2013-08-31 MED ORDER — LORAZEPAM 2 MG/ML IJ SOLN
2.0000 mg | Freq: Once | INTRAMUSCULAR | Status: AC
  Administered 2013-08-31: 2 mg via INTRAMUSCULAR
  Filled 2013-08-31: qty 1

## 2013-08-31 MED ORDER — ONDANSETRON HCL 4 MG PO TABS: 4.0000 mg | ORAL_TABLET | Freq: Three times a day (TID) | ORAL | Status: DC | PRN

## 2013-08-31 MED ORDER — DIPHENHYDRAMINE HCL 50 MG/ML IJ SOLN
50.0000 mg | Freq: Once | INTRAMUSCULAR | Status: AC
  Administered 2013-08-31: 50 mg via INTRAMUSCULAR
  Filled 2013-08-31: qty 1

## 2013-08-31 MED ORDER — STERILE WATER FOR INJECTION IJ SOLN
INTRAMUSCULAR | Status: DC
Start: 2013-08-31 — End: 2013-08-31
  Filled 2013-08-31: qty 10

## 2013-08-31 MED ORDER — ZIPRASIDONE MESYLATE 20 MG IM SOLR: 10.0000 mg | INTRAMUSCULAR | Status: DC | PRN

## 2013-08-31 MED ORDER — ZOLPIDEM TARTRATE 10 MG PO TABS: 10.0000 mg | ORAL_TABLET | Freq: Every evening | ORAL | Status: DC | PRN

## 2013-08-31 MED ORDER — ALUM & MAG HYDROXIDE-SIMETH 200-200-20 MG/5ML PO SUSP
30.0000 mL | ORAL | Status: DC | PRN
  Administered 2013-09-01: 30 mL via ORAL
  Filled 2013-08-31: qty 30

## 2013-08-31 MED ORDER — TRAZODONE HCL 50 MG PO TABS
50.0000 mg | ORAL_TABLET | Freq: Every evening | ORAL | Status: DC | PRN
  Administered 2013-08-31 – 2013-09-01 (×2): 50 mg via ORAL
  Filled 2013-08-31 (×2): qty 1

## 2013-08-31 MED ORDER — STERILE WATER FOR INJECTION IJ SOLN
INTRAMUSCULAR | Status: AC
  Administered 2013-08-31: 14:00:00
  Filled 2013-08-31: qty 10

## 2013-08-31 MED ORDER — LORAZEPAM 2 MG/ML IJ SOLN
1.0000 mg | INTRAMUSCULAR | Status: DC | PRN
  Administered 2013-09-01: 1 mg via INTRAMUSCULAR
  Filled 2013-08-31: qty 1

## 2013-08-31 MED ORDER — WHITE PETROLATUM GEL
Status: DC | PRN
  Filled 2013-08-31: qty 5

## 2013-08-31 MED ORDER — ACETAMINOPHEN 325 MG PO TABS: 650.0000 mg | ORAL_TABLET | ORAL | Status: DC | PRN

## 2013-08-31 MED ORDER — ZIPRASIDONE MESYLATE 20 MG IM SOLR
20.0000 mg | Freq: Once | INTRAMUSCULAR | Status: AC
  Administered 2013-08-31: 20 mg via INTRAMUSCULAR
  Filled 2013-08-31: qty 20

## 2013-08-31 NOTE — ED Notes (Signed)
Pt brought in by her mother  Pt is manic  Pt talking rapidly  [place] [address] [place] was there causing a disturbance  Police were on scene   Pt has bipolar and schizophrenia

## 2013-08-31 NOTE — ED Notes (Signed)
Per Charge RN FM, pt to be moved back to triage d/t using foul language to visitors and will not stop making inappropriate comments to staff and pt family members.

## 2013-08-31 NOTE — ED Notes (Addendum)
Pt sleeping soundly, arousable, but started cursing at staff when she woke up then fell back asleep.
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
## 2013-08-31 NOTE — ED Notes (Signed)
Patient frequently stating that she is going to shoot others including her mother, [employer], and security.

## 2013-08-31 NOTE — ED Notes (Signed)
IVC papers not received by magistrate, refaxed.

## 2013-08-31 NOTE — ED Provider Notes (Signed)
CSN: [id number]     Arrival date & time 08/31/13  0615 History   First MD Initiated Contact with Patient 08/31/13 [phone]     Chief Complaint  Patient presents with  . bipolar/ manic    Level V caveat for psychiatric illness  (Consider location/radiation/quality/duration/timing/severity/associated sxs/prior Treatment) HPI patient has history of bipolar disorder. She presents to the emergency department today with her mother. Her mother states she was called by a convenient store because the patient was there causing a disturbance. She reports the police also been called. She states the patient has not slept in a couple days. She also states she does not think she's been taking her medications. During the course of our conversation patient is very verbally aggressive and abusive, she is also threatening to shoot her mother. Her mother states she has acted this way before. She believes she was last admitted just a couple months ago for similar behavior. Patient has pressured speech and is difficult to keep on topic. She appears to be having conversations with people who were not in the room.  PCP none  Past Medical History  Diagnosis Date  . Bipolar affective   . OCD (obsessive compulsive disorder)   . Anxiety    History reviewed. No pertinent past surgical history. Family History  Problem Relation Age of Onset  . Mental illness Neg Hx    History  Substance Use Topics  . Smoking status: Current Every Day Smoker -- 0.25 packs/day    Types: Cigarettes  . Smokeless tobacco: Not on file  . Alcohol Use: Yes   Lives alone Uses [facility]C   OB History   Grav Para Term Preterm Abortions TAB SAB Ect Mult Living                 Review of Systems  All other systems reviewed and are negative.     Allergies  Review of patient's allergies indicates no known allergies.  Home Medications   Prior to Admission medications   Medication Sig Start Date End Date Taking? Authorizing Provider   clonazePAM (KLONOPIN) 0.5 MG tablet Take 0.5 mg by mouth 2 (two) times daily as needed for anxiety.   Yes Historical Provider, MD  traZODone (DESYREL) 50 MG tablet Take 1 tablet (50 mg total) by mouth at bedtime as needed for sleep. 12/31/12  Yes Fransisca Kaufmann, NP   BP 128/70  Pulse 111  Temp(Src) 98.7 F (37.1 C) (Oral)  Resp 22  SpO2 98%  Vital signs normal except tachycardia  Physical Exam  Nursing note and vitals reviewed. Constitutional: She is oriented to person, place, and time. She appears well-developed and well-nourished.  Non-toxic appearance. She does not appear ill. No distress.  HENT:  Head: Normocephalic and atraumatic.  Right Ear: External ear normal.  Left Ear: External ear normal.  Nose: Nose normal. No mucosal edema or rhinorrhea.  Mouth/Throat: Oropharynx is clear and moist and mucous membranes are normal. No dental abscesses or uvula swelling.  Eyes: Conjunctivae and EOM are normal. Pupils are equal, round, and reactive to light.  Neck: Normal range of motion and full passive range of motion without pain. Neck supple.  Cardiovascular: Normal rate, regular rhythm and normal heart sounds.  Exam reveals no gallop and no friction rub.   No murmur heard. Pulmonary/Chest: Effort normal and breath sounds normal. No respiratory distress. She has no wheezes. She has no rhonchi. She has no rales. She exhibits no tenderness and no crepitus.  Abdominal: Soft. Normal appearance  and bowel sounds are normal. She exhibits no distension. There is no tenderness. There is no rebound and no guarding.  Musculoskeletal: Normal range of motion. She exhibits no edema and no tenderness.  Moves all extremities well.   Neurological: She is alert and oriented to person, place, and time. She has normal strength. No cranial nerve deficit.  Skin: Skin is warm, dry and intact. No rash noted. No erythema. No pallor.  Psychiatric: Her mood appears anxious. Her affect is angry, labile and  inappropriate. Her speech is rapid and/or pressured. She is agitated, aggressive and hyperactive. Thought content is paranoid and delusional. She expresses homicidal ideation. She expresses homicidal plans.  Patient is talking into her hand like it's a phone, she is having active conversations with people who are not there. She is very verbally abusive and aggressive. She is threading to shoot her mother "because she is stupid"    ED Course  Procedures (including critical care time)  Medications  LORazepam (ATIVAN) tablet 1 mg (1 mg Oral Given 08/31/13 1247)  acetaminophen (TYLENOL) tablet 650 mg (not administered)  ibuprofen (ADVIL,MOTRIN) tablet 600 mg (600 mg Oral Given 08/31/13 1402)  nicotine (NICODERM CQ - dosed in mg/24 hours) patch 21 mg (21 mg Transdermal Not Given 08/31/13 1309)  ondansetron (ZOFRAN) tablet 4 mg (not administered)  alum & mag hydroxide-simeth (MAALOX/MYLANTA) 200-200-20 MG/5ML suspension 30 mL (not administered)  traZODone (DESYREL) tablet 50 mg (not administered)  LORazepam (ATIVAN) injection 1 mg (not administered)  ziprasidone (GEODON) injection 10 mg (not administered)  ziprasidone (GEODON) injection 20 mg (20 mg Intramuscular Given 08/31/13 0739)  sterile water (preservative free) injection (1.6 mLs  Given 08/31/13 0740)  LORazepam (ATIVAN) injection 1 mg (10 mg Intramuscular Given 08/31/13 1414)  ziprasidone (GEODON) injection 10 mg (10 mg Intramuscular Given 08/31/13 1415)  sterile water (preservative free) injection (  Given 08/31/13 1416)      IVC papers filled out and signed by me.   Patient was sleeping soundly after her first she is on injection.  About 5 hours after that she was starting to get more agitated, she was being verbally abusive to visitors passing by her room. She was sedated again.   16:00 pt waiting for TSS evaluation, this is going to be difficult because she swings from being totally sedated to being very agitated and aggressive.    Labs Review Results for orders placed during the hospital encounter of 08/31/13  CBC WITH DIFFERENTIAL      Result Value Ref Range   WBC 7.5  4.0 - 10.5 K/uL   RBC 3.71 (*) 3.87 - 5.11 MIL/uL   Hemoglobin 10.6 (*) 12.0 - 15.0 g/dL   HCT 16.131.1 (*) 09.636.0 - 04.546.0 %   MCV 83.8  78.0 - 100.0 fL   MCH 28.6  26.0 - 34.0 pg   MCHC 34.1  30.0 - 36.0 g/dL   RDW 40.913.9  81.111.5 - 91.415.5 %   Platelets 334  150 - 400 K/uL   Neutrophils Relative % 55  43 - 77 %   Neutro Abs 4.1  1.7 - 7.7 K/uL   Lymphocytes Relative 37  12 - 46 %   Lymphs Abs 2.8  0.7 - 4.0 K/uL   Monocytes Relative 7  3 - 12 %   Monocytes Absolute 0.5  0.1 - 1.0 K/uL   Eosinophils Relative 1  0 - 5 %   Eosinophils Absolute 0.1  0.0 - 0.7 K/uL   Basophils Relative 0  0 -  1 %   Basophils Absolute 0.0  0.0 - 0.1 K/uL  COMPREHENSIVE METABOLIC PANEL      Result Value Ref Range   Sodium 140  137 - 147 mEq/L   Potassium 3.7  3.7 - 5.3 mEq/L   Chloride 105  96 - 112 mEq/L   CO2 19  19 - 32 mEq/L   Glucose, Bld 136 (*) 70 - 99 mg/dL   BUN 21  6 - 23 mg/dL   Creatinine, Ser 1.611.28 (*) 0.50 - 1.10 mg/dL   Calcium 9.2  8.4 - 09.610.5 mg/dL   Total Protein 8.6 (*) 6.0 - 8.3 g/dL   Albumin 4.3  3.5 - 5.2 g/dL   AST 21  0 - 37 U/L   ALT 10  0 - 35 U/L   Alkaline Phosphatase 54  39 - 117 U/L   Total Bilirubin 0.3  0.3 - 1.2 mg/dL   GFR calc non Af Amer 53 (*) >90 mL/min   GFR calc Af Amer 62 (*) >90 mL/min  ETHANOL      Result Value Ref Range   Alcohol, Ethyl (B) <11  0 - 11 mg/dL   Laboratory interpretation all normal except mild anemia, stable renal insuffic     Imaging Review No results found.   EKG Interpretation None      MDM   Final diagnoses:  Bipolar disease, manic  Acute psychosis    Disposition pending  Devoria AlbeIva Vianney Kopecky, MD, Armando GangFACEP     Ward GivensIva L Triana Coover, MD 08/31/13 1610
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
## 2013-09-01 DIAGNOSIS — F3110 Bipolar disorder, current episode manic without psychotic features, unspecified: Secondary | ICD-10-CM | POA: Diagnosis not present

## 2013-09-01 DIAGNOSIS — F259 Schizoaffective disorder, unspecified: Secondary | ICD-10-CM | POA: Diagnosis not present

## 2013-09-01 DIAGNOSIS — R45850 Homicidal ideations: Secondary | ICD-10-CM

## 2013-09-01 MED ORDER — RISPERIDONE 1 MG PO TBDP
1.0000 mg | ORAL_TABLET | ORAL | Status: AC
  Administered 2013-09-01: 1 mg via ORAL
  Filled 2013-09-01: qty 1

## 2013-09-01 MED ORDER — RISPERIDONE 1 MG PO TBDP
1.0000 mg | ORAL_TABLET | Freq: Two times a day (BID) | ORAL | Status: DC
  Administered 2013-09-01: 1 mg via ORAL
  Filled 2013-09-01 (×3): qty 1

## 2013-09-01 MED ORDER — CLONAZEPAM 0.5 MG PO TABS
0.5000 mg | ORAL_TABLET | Freq: Two times a day (BID) | ORAL | Status: DC | PRN
  Administered 2013-09-02: 0.5 mg via ORAL
  Filled 2013-09-01: qty 1
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
## 2013-09-01 NOTE — ED Notes (Signed)
Up on the phone 

## 2013-09-01 NOTE — ED Notes (Signed)
tts in talking w/ pt

## 2013-09-01 NOTE — Progress Notes (Signed)
[facility]: Referral Faxed [place]:Referral Faxed [place]:Referral Faxed [place]:[id number]:Referral Faxed  The following facilities were at capacity: [facility]  KeDra Cathey Disposition Tech

## 2013-09-01 NOTE — BH Assessment (Signed)
[facility] Assessment Progress Note   Update:  [facility] referral completed by this clincian.  Called Sonya @ [facility] and got authorization number [id number] for 09/01/13-09/07/13 @ 1155.  Called Gilchris @ [phone] and completed phone referral for the pt.  [facility]  TTS to follow up with referral.  Casimer LaniusKristen Butler, MS, [facility] Licensed Professional Counselor Triage Specialist

## 2013-09-01 NOTE — ED Notes (Signed)
In the bathroom to shower and change scrubs 

## 2013-09-01 NOTE — ED Notes (Signed)
Up in room eating pop, still cursing at times, calmer, medicated per orders. Procedures explained.

## 2013-09-01 NOTE — BH Assessment (Signed)
Per Rosey BathKelly Southard, [facility] at [facility], adult unit is currently at capacity. Contacted the following facilities for placement:   [facility]: [facility] on [facility].  AT CAPACITY:  [facility], per [facility]  [facility], per Earl LitesJennifer  [facility], Per [facility]  [facility], Per [facility]  [facility] per [facility]  [facility], Per [facility]  [facility], per [facility]  [facility], Per [facility]  [facility], per [facility]  [facility], per Meryle ReadyWanda  [facility], per [facility]  [facility] [facility], per Darvin Neighbourshristine  [facility], per [facility]  [facility], per [facility]  [facility], per [facility]  [facility], per [place]  DECLINED: [employer]   Harlin RainFord Ellis Patsy BaltimoreWarrick Jr, [facility], [facility] Triage Specialist [phone]
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
## 2013-09-01 NOTE — ED Notes (Signed)
Dr arfeen and shuvon np into see 

## 2013-09-01 NOTE — ED Notes (Signed)
Up to the bathroom 

## 2013-09-01 NOTE — ED Notes (Signed)
Talking w/ greg-tts

## 2013-09-01 NOTE — Progress Notes (Signed)
[place]: Pt Declined due to aggression.  KeDra Cathey Disposition Tech

## 2013-09-02 ENCOUNTER — Encounter (HOSPITAL_COMMUNITY): Payer: Self-pay | Admitting: Psychiatry

## 2013-09-02 DIAGNOSIS — R45850 Homicidal ideations: Secondary | ICD-10-CM | POA: Diagnosis not present

## 2013-09-02 DIAGNOSIS — F259 Schizoaffective disorder, unspecified: Secondary | ICD-10-CM | POA: Diagnosis not present

## 2013-09-02 DIAGNOSIS — F3110 Bipolar disorder, current episode manic without psychotic features, unspecified: Secondary | ICD-10-CM | POA: Diagnosis not present

## 2013-09-02 DIAGNOSIS — F23 Brief psychotic disorder: Secondary | ICD-10-CM | POA: Diagnosis not present

## 2013-09-02 NOTE — BH Assessment (Signed)
[facility]: 1.)Pregnancy test 2.) MAR and 3.) Vitals. This Clinical research associatewriter informed Junious DresserConnie that this information will be faxed for review.   Glorious PeachNajah Caroleen Stoermer, MS, LCASA Assessment Counselor

## 2013-09-02 NOTE — Progress Notes (Signed)
Pt. Given packet of community mental health resources.  The packet was explained to the pt.

## 2013-09-02 NOTE — BH Assessment (Signed)
[facility] Assessment Progress Note  At 11:20 I received a [facility].  Pt has been accepted to their facility.  I have notified Melynda Rippleoyka Perry, TS in the SAPPU.  Doylene Canninghomas Mung Rinker, MA Triage Specialist 6.29/2015 @ 11:24

## 2013-09-02 NOTE — ED Notes (Signed)
Pt aaox3.  Pt at the window talking.  Pt deneis SI/HI/AH/H. Pt reports wanting to go home.  "My mom just want me here, she know aint nothing wrong with me."
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
## 2013-09-02 NOTE — ED Notes (Signed)
Pt on the phone with her mom.  Pt screaming and cursing her mom on the phone.  Pt advised to give up phone.  Will continue to monitor.

## 2013-09-02 NOTE — ED Notes (Signed)
Pt continuously at nurses station.  Pt going in other patients rooms.  Pt asked to go in room.  Pt easily redirected but patient gets back up and repeats behavior.

## 2013-09-02 NOTE — BH Assessment (Deleted)
Pt. given packet of community mental health resources.  Packet was explained to the pt.

## 2013-09-02 NOTE — BH Assessment (Signed)
[name],[facility] called to confirm that pt is still on waitlist @0830 .  [name], MS, LCASA  Assessment Counselor
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
## 2013-09-17 DIAGNOSIS — F314 Bipolar disorder, current episode depressed, severe, without psychotic features: Secondary | ICD-10-CM | POA: Diagnosis not present

## 2013-09-24 DIAGNOSIS — B9689 Other specified bacterial agents as the cause of diseases classified elsewhere: Secondary | ICD-10-CM | POA: Diagnosis not present

## 2013-09-24 DIAGNOSIS — B3731 Acute candidiasis of vulva and vagina: Secondary | ICD-10-CM | POA: Diagnosis not present

## 2013-09-24 DIAGNOSIS — A499 Bacterial infection, unspecified: Secondary | ICD-10-CM | POA: Diagnosis not present

## 2013-09-24 DIAGNOSIS — B373 Candidiasis of vulva and vagina: Secondary | ICD-10-CM | POA: Diagnosis not present

## 2013-10-01 ENCOUNTER — Emergency Department (HOSPITAL_COMMUNITY)
Admission: EM | Admit: 2013-10-01 | Discharge: 2013-10-03 | Disposition: A | Discharge status: Home / Self Care | Payer: Medicare Other | Attending: Emergency Medicine | Admitting: Emergency Medicine

## 2013-10-01 ENCOUNTER — Encounter (HOSPITAL_COMMUNITY): Payer: Self-pay | Admitting: Emergency Medicine

## 2013-10-01 DIAGNOSIS — F419 Anxiety disorder, unspecified: Secondary | ICD-10-CM

## 2013-10-01 DIAGNOSIS — F319 Bipolar disorder, unspecified: Secondary | ICD-10-CM | POA: Insufficient documentation

## 2013-10-01 DIAGNOSIS — F17200 Nicotine dependence, unspecified, uncomplicated: Secondary | ICD-10-CM | POA: Insufficient documentation

## 2013-10-01 DIAGNOSIS — F3189 Other bipolar disorder: Secondary | ICD-10-CM | POA: Diagnosis not present

## 2013-10-01 DIAGNOSIS — IMO0002 Reserved for concepts with insufficient information to code with codable children: Secondary | ICD-10-CM | POA: Insufficient documentation

## 2013-10-01 DIAGNOSIS — F3010 Manic episode without psychotic symptoms, unspecified: Secondary | ICD-10-CM

## 2013-10-01 DIAGNOSIS — Z79899 Other long term (current) drug therapy: Secondary | ICD-10-CM | POA: Diagnosis not present

## 2013-10-01 DIAGNOSIS — F411 Generalized anxiety disorder: Secondary | ICD-10-CM | POA: Insufficient documentation

## 2013-10-01 DIAGNOSIS — F3110 Bipolar disorder, current episode manic without psychotic features, unspecified: Secondary | ICD-10-CM | POA: Diagnosis not present

## 2013-10-01 DIAGNOSIS — F919 Conduct disorder, unspecified: Secondary | ICD-10-CM | POA: Diagnosis not present
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
## 2013-10-02 DIAGNOSIS — F3110 Bipolar disorder, current episode manic without psychotic features, unspecified: Secondary | ICD-10-CM | POA: Diagnosis not present

## 2013-10-02 DIAGNOSIS — F411 Generalized anxiety disorder: Secondary | ICD-10-CM | POA: Diagnosis not present

## 2013-10-02 LAB — ETHANOL: Alcohol, Ethyl (B): <11 mg/dL (ref 0–11)

## 2013-10-02 LAB — CBC WITH DIFFERENTIAL/PLATELET
Basophils Absolute: 0 10*3/uL (ref 0.0–0.1)
Basophils Relative: 0 % (ref 0–1)
EOS ABS: 0.2 10*3/uL (ref 0.0–0.7)
Eosinophils Relative: 2 % (ref 0–5)
HCT: 32.5 % — ABNORMAL LOW (ref 36.0–46.0)
HEMOGLOBIN: 10.7 g/dL — AB (ref 12.0–15.0)
LYMPHS PCT: 37 % (ref 12–46)
Lymphs Abs: 3.4 10*3/uL (ref 0.7–4.0)
MCH: 28.1 pg (ref 26.0–34.0)
MCHC: 32.9 g/dL (ref 30.0–36.0)
MCV: 85.3 fL (ref 78.0–100.0)
MONO ABS: 0.6 10*3/uL (ref 0.1–1.0)
MONOS PCT: 6 % (ref 3–12)
Neutro Abs: 4.9 10*3/uL (ref 1.7–7.7)
Neutrophils Relative %: 55 % (ref 43–77)
Platelets: 382 10*3/uL (ref 150–400)
RBC: 3.81 MIL/uL — ABNORMAL LOW (ref 3.87–5.11)
RDW: 14.4 % (ref 11.5–15.5)
WBC: 9 10*3/uL (ref 4.0–10.5)

## 2013-10-02 LAB — COMPREHENSIVE METABOLIC PANEL
ALT: 15 U/L (ref 0–35)
ANION GAP: 15 (ref 5–15)
AST: 25 U/L (ref 0–37)
Albumin: 4.2 g/dL (ref 3.5–5.2)
Alkaline Phosphatase: 57 U/L (ref 39–117)
BUN: 18 mg/dL (ref 6–23)
CALCIUM: 9.2 mg/dL (ref 8.4–10.5)
CO2: 19 meq/L (ref 19–32)
CREATININE: 1.3 mg/dL — AB (ref 0.50–1.10)
Chloride: 104 mEq/L (ref 96–112)
GFR, EST AFRICAN AMERICAN: 61 mL/min — AB (ref >90)
GFR, EST NON AFRICAN AMERICAN: 52 mL/min — AB (ref >90)
GLUCOSE: 176 mg/dL — AB (ref 70–99)
Potassium: 3.7 mEq/L (ref 3.7–5.3)
SODIUM: 138 meq/L (ref 137–147)
TOTAL PROTEIN: 8.8 g/dL — AB (ref 6.0–8.3)
Total Bilirubin: <0.2 mg/dL — ABNORMAL LOW (ref 0.3–1.2)

## 2013-10-02 MED ORDER — OLANZAPINE 10 MG PO TBDP
20.0000 mg | ORAL_TABLET | Freq: Every day | ORAL | Status: DC
  Administered 2013-10-02: 20 mg via ORAL
  Filled 2013-10-02: qty 2

## 2013-10-02 MED ORDER — ZIPRASIDONE MESYLATE 20 MG IM SOLR
10.0000 mg | Freq: Once | INTRAMUSCULAR | Status: AC
  Administered 2013-10-02: 10 mg via INTRAMUSCULAR
  Filled 2013-10-02: qty 20

## 2013-10-02 MED ORDER — OLANZAPINE 10 MG PO TBDP
10.0000 mg | ORAL_TABLET | Freq: Once | ORAL | Status: AC
  Administered 2013-10-02: 10 mg via ORAL
  Filled 2013-10-02: qty 1

## 2013-10-02 MED ORDER — LORAZEPAM 2 MG/ML IJ SOLN
2.0000 mg | Freq: Once | INTRAMUSCULAR | Status: AC
  Administered 2013-10-02: 2 mg via INTRAMUSCULAR
  Filled 2013-10-02: qty 1

## 2013-10-02 MED ORDER — LORAZEPAM 1 MG PO TABS
1.0000 mg | ORAL_TABLET | Freq: Three times a day (TID) | ORAL | Status: DC | PRN
  Administered 2013-10-02 – 2013-10-03 (×3): 1 mg via ORAL
  Filled 2013-10-02 (×3): qty 1

## 2013-10-02 MED ORDER — ZOLPIDEM TARTRATE 5 MG PO TABS
5.0000 mg | ORAL_TABLET | Freq: Every evening | ORAL | Status: DC | PRN
  Administered 2013-10-03: 5 mg via ORAL
  Filled 2013-10-02: qty 1

## 2013-10-02 MED ORDER — NICOTINE 21 MG/24HR TD PT24: 21.0000 mg | MEDICATED_PATCH | Freq: Every day | TRANSDERMAL | Status: DC

## 2013-10-02 MED ORDER — DIPHENHYDRAMINE HCL 50 MG/ML IJ SOLN
50.0000 mg | Freq: Once | INTRAMUSCULAR | Status: AC
  Administered 2013-10-02: 50 mg via INTRAMUSCULAR
  Filled 2013-10-02: qty 1

## 2013-10-02 MED ORDER — ONDANSETRON HCL 4 MG PO TABS: 4.0000 mg | ORAL_TABLET | Freq: Three times a day (TID) | ORAL | Status: DC | PRN

## 2013-10-02 MED ORDER — IBUPROFEN 200 MG PO TABS: 600.0000 mg | ORAL_TABLET | Freq: Three times a day (TID) | ORAL | Status: DC | PRN

## 2013-10-02 MED ORDER — ACETAMINOPHEN 325 MG PO TABS: 650.0000 mg | ORAL_TABLET | ORAL | Status: DC | PRN

## 2013-10-02 MED ORDER — ALUM & MAG HYDROXIDE-SIMETH 200-200-20 MG/5ML PO SUSP: 30.0000 mL | ORAL | Status: DC | PRN

## 2013-10-02 NOTE — ED Notes (Signed)
Pt. Asleep.

## 2013-10-02 NOTE — ED Notes (Signed)
Pt came back out to the nurses station, started to laugh very loudly.  Pt redirected back to her room.
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
## 2013-10-02 NOTE — ED Notes (Signed)
Pt. Pacing unit, loud and verbally abusive to staff. Pt. Cursing loudly and won't be redirected despite multiple attempts to deescalate. Security and [employer] called to assist to no avail. Pt. Not responding to attempts to direct her to her room. Will consult with EDP for possible pharmaceutical intervention.

## 2013-10-02 NOTE — ED Notes (Signed)
Terri counselor reported that IVC papers would be initiated by the [employer] for patient violent and threaten behavior.

## 2013-10-02 NOTE — ED Notes (Signed)
Tied to get patient to lay in her bed because she seem to be a little drowsy, patient refused and start cursing saying " you need to go and do your fucking job and stop listening to them"(security). She would sit in here chair but once she realizes security has left she will come back out of her room. Patient will not follow directions to try and lay down because e of her drowsiness
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
## 2013-10-02 NOTE — ED Notes (Signed)
Patient being monitor at this time respirations equal and unlabored, skin warm and dry. No acute distress noted. Will continue to monitor patient. NAD.

## 2013-10-02 NOTE — ED Notes (Signed)
Pt came to the desk, states "this crazy bitch keeps talking about y'all."  Pt's mother came out of the room, calling her name.  Asked her mother to stay in the waiting area while pt is waiting to be assessed.  Tresa EndoKelly EDPA on the phone with TTS

## 2013-10-02 NOTE — ED Notes (Signed)
After injection given patient continues to walk in hallways and security redirects back to her room. Carrolyn LeighMegan Rhodes, RN at patient bedside providing encouragement and support. Safety maintain.
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
## 2013-10-02 NOTE — Consult Note (Signed)
  Review of Systems  Unable to perform ROS: acuity of condition    

## 2013-10-02 NOTE — ED Notes (Signed)
Pt Sleeping

## 2013-10-02 NOTE — ED Notes (Signed)
Patient was woke up by patient in room 38. Patient in room 38 pushed open the door to 41  And yelled in the room "She sleeps to much". Patient in room 41 wakes up and now is yelling and cursing at staff and patient.
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
## 2013-10-02 NOTE — ED Notes (Signed)
Patient had IM injection with standby assist from [employer] and security. Patient tolerated medication injection well. No acute distress noted.

## 2013-10-02 NOTE — ED Notes (Signed)
Patient not redirectable at this time. Patient going in other patients' rooms and yelling at them. Patient unable to stay in room and speak without yelling at this time.

## 2013-10-02 NOTE — ED Notes (Signed)
Patient states she has an appointment 10/03/13 at 4:00pm at the [facility] and does not want to miss it.

## 2013-10-02 NOTE — ED Notes (Signed)
TTS at bedside. 

## 2013-10-02 NOTE — ED Notes (Signed)
Pt called this nurse in the room, states that her mother did not like this nurse.  Her mother at bedside states "why are you saying that? I never said a word about you."  Pt states "she said she does not like you.  She is racist."  "if you keep dying your hair like that it's gonna dry out."  Pt was sitting in the chair in the room.

## 2013-10-02 NOTE — ED Notes (Signed)
Patient is very drowsy. Continues to get up and require redirection and assistance back to bed. Reminded of fall safety precautions.   Encouraged to stay in bed and rest.  Q 15 checks continue. Jillyn HiddenGary RN in room with patient to ensure safety.

## 2013-10-02 NOTE — Consult Note (Signed)
[facility] Consult   Reason for Consult:  Apparently manic and not taking her medications Referring Physician:  ER MD  [name] is an 38 y.o. female. Total Time spent with patient: 45 minutes  Assessment: AXIS I:  Bipolar, Manic AXIS II:  Deferred AXIS III:   Past Medical History  Diagnosis Date  . Bipolar affective   . OCD (obsessive compulsive disorder)   . Anxiety    AXIS IV:  chronic mental illness AXIS V:  41-50 serious symptoms  Plan:  Recommend psychiatric Inpatient admission when medically cleared.  Subjective:   [name] is a 38 y.o. female patient admitted with mania.  HPI:  [name] not sit still long enough to answer questions and is preoccupied with her own thoughts and ignores questions.  She is very irritable and easily agitated but for the most part easily re-directed.  But she is constantly talking and moving.  She talks about irrelevant things but is more directed in trying to get out of here by calling her mother to come get her .She is quite inappropriate with other patients and staff, pushing me for example for no apparent reason. HPI Elements:   Location:  manic behavior. Quality:  random talking and constant movement. Severity:  niot making sense and can be threatening when irritated. Timing:  not taking her medications recently. Duration:  few days for this episode. Context:  as above.  Past Psychiatric History: Past Medical History  Diagnosis Date  . Bipolar affective   . OCD (obsessive compulsive disorder)   . Anxiety     reports that she has been smoking Cigarettes.  She has been smoking about 0.25 packs per day. She does not have any smokeless tobacco history on file. She reports that she drinks alcohol. She reports that she uses illicit drugs (Marijuana). Family History  Problem Relation Age of Onset  . Mental illness Neg Hx    Family History Substance Abuse: No Family Supports: Yes, List: (Mother  ) Living Arrangements: Alone Can pt return to current living arrangement?: Yes Abuse/Neglect [facility]) Physical Abuse: Denies Verbal Abuse: Denies Sexual Abuse: Denies Allergies:   Allergies  Allergen Reactions  . Depakote [Divalproex Sodium] Other (See Comments)    "knocked out"    ACT Assessment Complete:  Yes:    Educational Status    Risk to Self: Risk to self with the past 6 months Suicidal Ideation: No Suicidal Intent: No Is patient at risk for suicide?: No Suicidal Plan?: No Access to Means: No What has been your use of drugs/alcohol within the last 12 months?: Abusing: marijuana  Previous Attempts/Gestures: No How many times?: 0 Other Self Harm Risks: None  Triggers for Past Attempts: None known Intentional Self Injurious Behavior: None Family Suicide History: Unknown Recent stressful life event(s): Other (Comment);Conflict (Comment) (Non compliant w/meds; Issues w/mother; chronic meds ) Persecutory voices/beliefs?: Yes Depression: No Depression Symptoms:  (None reported ) Substance abuse history and/or treatment for substance abuse?:  (waiting on urine specimen    BAL<11) Suicide prevention information given to non-admitted patients: Not applicable  Risk to Others: Risk to Others within the past 6 months Homicidal Ideation: No Thoughts of Harm to Others: No Comment - Thoughts of Harm to Others: None  Current Homicidal Intent: No Current Homicidal Plan: No Describe Current Homicidal Plan: None  Access to Homicidal Means: No Identified Victim: None  History of harm to others?: No Assessment of Violence: None Noted Violent Behavior Description: None  Does patient have access to weapons?: No Criminal Charges Pending?: No Does patient have a court date: No  Abuse: Abuse/Neglect Assessment (Assessment to be complete while patient is alone) Physical Abuse: Denies Verbal Abuse: Denies Sexual Abuse: Denies Exploitation of patient/patient's resources:  Denies Self-Neglect: Denies  Prior Inpatient Therapy: Prior Inpatient Therapy Prior Inpatient Therapy: Yes Prior Therapy Dates: 2006,2007,2008,2012,2014 Prior Therapy Facilty/Provider(s): [facility], [facility], [place], [place]  Reason for Treatment: Bipolar D/O   Prior Outpatient Therapy: Prior Outpatient Therapy Prior Outpatient Therapy: Yes Prior Therapy Dates: Current Prior Therapy Facilty/Provider(s): [place] Reason for Treatment: Med Mgt/Therapy   Additional Information: Additional Information 1:1 In Past 12 Months?: No CIRT Risk: No Elopement Risk: No Does patient have medical clearance?: Yes                  Objective: Blood pressure 118/86, pulse 101, temperature 98 F (36.7 C), temperature source Oral, resp. rate 20, last menstrual period 08/21/2013, SpO2 100.00%.There is no weight on file to calculate BMI. Results for orders placed during the hospital encounter of 10/01/13 (from the past 72 hour(s))  CBC WITH DIFFERENTIAL     Status: Abnormal   Collection Time    10/02/13  1:04 AM      Result Value Ref Range   WBC 9.0  4.0 - 10.5 K/uL   RBC 3.81 (*) 3.87 - 5.11 MIL/uL   Hemoglobin 10.7 (*) 12.0 - 15.0 g/dL   HCT 32.5 (*) 36.0 - 46.0 %   MCV 85.3  78.0 - 100.0 fL   MCH 28.1  26.0 - 34.0 pg   MCHC 32.9  30.0 - 36.0 g/dL   RDW 14.4  11.5 - 15.5 %   Platelets 382  150 - 400 K/uL   Neutrophils Relative % 55  43 - 77 %   Neutro Abs 4.9  1.7 - 7.7 K/uL   Lymphocytes Relative 37  12 - 46 %   Lymphs Abs 3.4  0.7 - 4.0 K/uL   Monocytes Relative 6  3 - 12 %   Monocytes Absolute 0.6  0.1 - 1.0 K/uL   Eosinophils Relative 2  0 - 5 %   Eosinophils Absolute 0.2  0.0 - 0.7 K/uL   Basophils Relative 0  0 - 1 %   Basophils Absolute 0.0  0.0 - 0.1 K/uL  COMPREHENSIVE METABOLIC PANEL     Status: Abnormal   Collection Time    10/02/13  1:04 AM      Result Value Ref Range   Sodium 138  137 - 147 mEq/L   Potassium 3.7  3.7 - 5.3 mEq/L   Chloride 104  96 - 112 mEq/L    CO2 19  19 - 32 mEq/L   Glucose, Bld 176 (*) 70 - 99 mg/dL   BUN 18  6 - 23 mg/dL   Creatinine, Ser 1.30 (*) 0.50 - 1.10 mg/dL   Calcium 9.2  8.4 - 10.5 mg/dL   Total Protein 8.8 (*) 6.0 - 8.3 g/dL   Albumin 4.2  3.5 - 5.2 g/dL   AST 25  0 - 37 U/L   ALT 15  0 - 35 U/L   Alkaline Phosphatase 57  39 - 117 U/L   Total Bilirubin <0.2 (*) 0.3 - 1.2 mg/dL   GFR calc non Af Amer 52 (*) >90 mL/min   GFR calc Af Amer 61 (*) >90 mL/min   Comment: (NOTE)     The eGFR has been calculated using the  CKD EPI equation.     This calculation has not been validated in all clinical situations.     eGFR's persistently <90 mL/min signify possible Chronic Kidney     Disease.   Anion gap 15  5 - 15  ETHANOL     Status: None   Collection Time    10/02/13  1:04 AM      Result Value Ref Range   Alcohol, Ethyl (B) <11  0 - 11 mg/dL   Comment:            LOWEST DETECTABLE LIMIT FOR     SERUM ALCOHOL IS 11 mg/dL     FOR MEDICAL PURPOSES ONLY   Labs are reviewed and are pertinent for no psychiatric issues.  Current Facility-Administered Medications  Medication Dose Route Frequency Provider Last Rate Last Dose  . acetaminophen (TYLENOL) tablet 650 mg  650 mg Oral Q4H PRN Kristen N Ward, DO      . alum & mag hydroxide-simeth (MAALOX/MYLANTA) 200-200-20 MG/5ML suspension 30 mL  30 mL Oral PRN Kristen N Ward, DO      . ibuprofen (ADVIL,MOTRIN) tablet 600 mg  600 mg Oral Q8H PRN Kristen N Ward, DO      . LORazepam (ATIVAN) tablet 1 mg  1 mg Oral Q8H PRN Kristen N Ward, DO   1 mg at 10/02/13 1207  . nicotine (NICODERM CQ - dosed in mg/24 hours) patch 21 mg  21 mg Transdermal Daily Kristen N Ward, DO      . ondansetron (ZOFRAN) tablet 4 mg  4 mg Oral Q8H PRN Kristen N Ward, DO      . zolpidem (AMBIEN) tablet 5 mg  5 mg Oral QHS PRN Delice Bison Ward, DO       Current Outpatient Prescriptions  Medication Sig Dispense Refill  . clonazePAM (KLONOPIN) 0.5 MG tablet Take 0.5 mg by mouth 3 (three) times daily.        . traZODone (DESYREL) 50 MG tablet Take 1 tablet (50 mg total) by mouth at bedtime as needed for sleep.  30 tablet  0    Psychiatric Specialty Exam:     Blood pressure 118/86, pulse 101, temperature 98 F (36.7 C), temperature source Oral, resp. rate 20, last menstrual period 08/21/2013, SpO2 100.00%.There is no weight on file to calculate BMI.  General Appearance: Casual  Eye Contact::  Poor  Speech:  Pressured  Volume:  Increased  Mood:  Irritable  Affect:  Labile  Thought Process:  Irrelevant  Orientation:  Full (Time, Place, and Person)  Thought Content:  Negative  Suicidal Thoughts:  No  Homicidal Thoughts:  No  Memory:  Immediate;   Poor Recent;   Poor Remote;   Poor  Judgement:  Impaired  Insight:  Lacking  Psychomotor Activity:  Increased  Concentration:  Poor  Recall:  Poor  Fund of Knowledge:Poor  Language: Poor  Akathisia:  Negative  Handed:  Right  AIMS (if indicated):     Assets:  Housing Social Support  Sleep:      Musculoskeletal: Strength & Muscle Tone: within normal limits Gait & Station: normal Patient leans: N/A  Treatment Plan Summary: Daily contact with patient to assess and evaluate symptoms and progress in treatment Medication management look for inpatient bed for treatment of mania  Jamea Robicheaux D 10/02/2013 1:04 PM

## 2013-10-02 NOTE — Progress Notes (Signed)
  CARE MANAGEMENT ED NOTE 10/02/2013  Patient:  [name],[name]   Account Number:  [id number]  Date Initiated:  10/02/2013  Documentation initiated by:  Radford PaxFERRERO,Loukisha Gunnerson  Subjective/Objective Assessment:   Patient presents to Ed with anxiety, manic episode     Subjective/Objective Assessment Detail:     Action/Plan:   Action/Plan Detail:   Anticipated DC Date:       Status Recommendation to Physician:   Result of Recommendation:    Other ED Services  Consult Working Plan    DC Planning Services  Other  PCP issues    Choice offered to / List presented to:            Status of service:  Completed, signed off  ED Comments:   ED Comments Detail:  EDCM spoke to patient while patient in the hallway. Patient confirms [facility].  System updated.

## 2013-10-02 NOTE — ED Notes (Addendum)
Patient reports to Jillyn HiddenGary, RN who was sitting at patient bedside that she wet her scrubs. Patient given new scrubs and Michelle, NT at patient beside to assist patient with dressing change .  Encouragement and support provided and safety maintain.

## 2013-10-02 NOTE — ED Notes (Signed)
Patient given Ativan 2 mg IM in triage and now patient is asleep. When patient wakes up staff will give urine cup to obtain sample. Respirations equal and unlabored, skin warm and dry. No acute distress noted.

## 2013-10-02 NOTE — ED Notes (Signed)
Continues to be very difficult to redirect. Security called to assist with redirecting to her room. EDP-Dr.Miller notified of behaviors with orders received and initiated.

## 2013-10-02 NOTE — ED Notes (Addendum)
Difficult to redirect. Dr.Taylor notified. Orders received and initiated.

## 2013-10-02 NOTE — ED Notes (Signed)
Patient continues to get out of bed. Redirected to stay in bed for safety.   Patient given snack and beverage.   Q 15 safety checks continue. Jillyn HiddenGary, RN and WilhoitMichelle MHT at bedside.

## 2013-10-02 NOTE — [facility] Counselor (Signed)
Per Zena AmosAshley L([facility]). Pt is being considered for intp admission, however due to numerous IM's to calm pt.'s behavior, [facility] is requested that the pt is monitored over night to make sure she is stable and no add'l IM's are required. This Clinical research associatewriter made Morrie Sheldonshley aware that pt is not physically aggressive but is verbally aggressive and manic.  [facility] will contact Psych ED back for update on pt.'s behavior.

## 2013-10-02 NOTE — BH Assessment (Signed)
Dr. Ladona Ridgelaylor [facility] No beds at this time patient referred to outside facilities. Patient under review at the following facilities: [facility], [place], [place], [address] [facility] [place], [facility], and [facility].

## 2013-10-02 NOTE — ED Notes (Signed)
Patient is sleeping at this time.

## 2013-10-02 NOTE — ED Notes (Signed)
Pt. Continues to be loud and verbally abusive to security. She is refusing to go to her room.

## 2013-10-03 DIAGNOSIS — F411 Generalized anxiety disorder: Secondary | ICD-10-CM | POA: Diagnosis not present

## 2013-10-03 DIAGNOSIS — F319 Bipolar disorder, unspecified: Secondary | ICD-10-CM | POA: Diagnosis not present

## 2013-10-03 MED ORDER — HALOPERIDOL LACTATE 5 MG/ML IJ SOLN
5.0000 mg | Freq: Once | INTRAMUSCULAR | Status: AC
  Administered 2013-10-03: 5 mg via INTRAMUSCULAR
  Filled 2013-10-03: qty 1

## 2013-10-03 MED ORDER — LORAZEPAM 2 MG/ML IJ SOLN
2.0000 mg | Freq: Once | INTRAMUSCULAR | Status: AC
  Administered 2013-10-03: 2 mg via INTRAMUSCULAR
  Filled 2013-10-03: qty 1

## 2013-10-03 MED ORDER — DIPHENHYDRAMINE HCL 50 MG/ML IJ SOLN
50.0000 mg | Freq: Once | INTRAMUSCULAR | Status: AC
  Administered 2013-10-03: 50 mg via INTRAMUSCULAR
  Filled 2013-10-03: qty 1

## 2013-10-03 NOTE — ED Notes (Signed)
Pt. In hall, talking loudly. Pt. Denies SI, HI and AVH when interviewed between outbursts.

## 2013-10-03 NOTE — ED Notes (Signed)
Pt. Repeatedly attempting to get up and wander into the hall. Pt. Repeatedly redirected back to bed and monitored for safety.

## 2013-10-03 NOTE — [facility] Counselor (Addendum)
Per Chanin Frumkin([facility]), pt has been accepted by Dr. Robet Leuhotakura for treatment.  [facility] after [facility]am [facility].  Pls call report to [phone]. [facility]C [facility].

## 2013-10-03 NOTE — Consult Note (Signed)
  Psychiatric Specialty Exam: Physical Exam  ROS  Blood pressure 118/90, pulse 88, temperature 97.8 F (36.6 C), temperature source Oral, resp. rate 18, last menstrual period 08/21/2013, SpO2 100.00%.There is no weight on file to calculate BMI.  General Appearance: Casual  Eye Contact::  Fair  Speech:  Pressured  Volume:  Increased  Mood:  Irritable  Affect:  Labile  Thought Process:  focused on being discharged  Orientation:  Full (Time, Place, and Person)  Thought Content:  Negative  Suicidal Thoughts:  No  Homicidal Thoughts:  No  Memory:  Immediate;   Good Recent;   Good Remote;   Good  Judgement:  Impaired  Insight:  Lacking  Psychomotor Activity:  Increased  Concentration:  Poor  Recall:  Fair  Akathisia:  Negative  Handed:  Right  AIMS (if indicated):   0  Assets:  Housing Social Support  Sleep:   poor  [name] is focused on being discharged to pay her bills.  She remains overly talkative, intrusive, inappropriate particularly with sexual comments towards  Males.  She has taken 30 mg Zyprexa Zydis yesterday, so I hope she will slow down some today.  The plan [facility] for treatment of he bipolar symptoms.

## 2013-10-03 NOTE — ED Notes (Signed)
Pt. Up to rest room with assistance. 

## 2013-10-03 NOTE — ED Notes (Signed)
Pt. Sleeping intermittently. Pt. Redirected and safety ensured by nurse and tech at bedside as needed.

## 2013-10-04 DIAGNOSIS — F319 Bipolar disorder, unspecified: Secondary | ICD-10-CM | POA: Diagnosis not present

## 2013-10-05 DIAGNOSIS — F319 Bipolar disorder, unspecified: Secondary | ICD-10-CM | POA: Diagnosis not present

## 2013-10-07 DIAGNOSIS — F319 Bipolar disorder, unspecified: Secondary | ICD-10-CM | POA: Diagnosis not present

## 2013-10-15 DIAGNOSIS — F319 Bipolar disorder, unspecified: Secondary | ICD-10-CM | POA: Diagnosis not present

## 2013-10-23 DIAGNOSIS — F314 Bipolar disorder, current episode depressed, severe, without psychotic features: Secondary | ICD-10-CM | POA: Diagnosis not present

## 2013-11-12 DIAGNOSIS — F314 Bipolar disorder, current episode depressed, severe, without psychotic features: Secondary | ICD-10-CM | POA: Diagnosis not present

## 2013-11-18 DIAGNOSIS — F314 Bipolar disorder, current episode depressed, severe, without psychotic features: Secondary | ICD-10-CM | POA: Diagnosis not present

## 2013-11-26 DIAGNOSIS — F314 Bipolar disorder, current episode depressed, severe, without psychotic features: Secondary | ICD-10-CM | POA: Diagnosis not present

## 2013-12-24 DIAGNOSIS — F314 Bipolar disorder, current episode depressed, severe, without psychotic features: Secondary | ICD-10-CM | POA: Diagnosis not present

## 2014-01-21 ENCOUNTER — Emergency Department (HOSPITAL_COMMUNITY)
Admission: EM | Admit: 2014-01-21 | Discharge: 2014-01-22 | Disposition: A | Discharge status: Other Acute Inpatient Hospital | Payer: Medicare Other | Attending: Emergency Medicine | Admitting: Emergency Medicine

## 2014-01-21 ENCOUNTER — Encounter (HOSPITAL_COMMUNITY): Payer: Self-pay | Admitting: Emergency Medicine

## 2014-01-21 DIAGNOSIS — Z720 Tobacco use: Secondary | ICD-10-CM | POA: Diagnosis not present

## 2014-01-21 DIAGNOSIS — F1310 Sedative, hypnotic or anxiolytic abuse, uncomplicated: Secondary | ICD-10-CM | POA: Diagnosis not present

## 2014-01-21 DIAGNOSIS — F42 Obsessive-compulsive disorder: Secondary | ICD-10-CM | POA: Insufficient documentation

## 2014-01-21 DIAGNOSIS — F600 Paranoid personality disorder: Secondary | ICD-10-CM | POA: Insufficient documentation

## 2014-01-21 DIAGNOSIS — F419 Anxiety disorder, unspecified: Secondary | ICD-10-CM | POA: Diagnosis not present

## 2014-01-21 DIAGNOSIS — Z79899 Other long term (current) drug therapy: Secondary | ICD-10-CM | POA: Insufficient documentation

## 2014-01-21 DIAGNOSIS — R45850 Homicidal ideations: Secondary | ICD-10-CM | POA: Diagnosis not present

## 2014-01-21 DIAGNOSIS — F22 Delusional disorders: Secondary | ICD-10-CM | POA: Diagnosis not present

## 2014-01-21 DIAGNOSIS — F309 Manic episode, unspecified: Secondary | ICD-10-CM | POA: Diagnosis present

## 2014-01-21 DIAGNOSIS — F3112 Bipolar disorder, current episode manic without psychotic features, moderate: Secondary | ICD-10-CM | POA: Insufficient documentation

## 2014-01-21 LAB — CBC
HEMATOCRIT: 32.7 % — AB (ref 36.0–46.0)
HEMOGLOBIN: 10.6 g/dL — AB (ref 12.0–15.0)
MCH: 25.1 pg — AB (ref 26.0–34.0)
MCHC: 32.4 g/dL (ref 30.0–36.0)
MCV: 77.3 fL — ABNORMAL LOW (ref 78.0–100.0)
Platelets: 419 10*3/uL — ABNORMAL HIGH (ref 150–400)
RBC: 4.23 MIL/uL (ref 3.87–5.11)
RDW: 15.5 % (ref 11.5–15.5)
WBC: 9.3 10*3/uL (ref 4.0–10.5)

## 2014-01-21 LAB — COMPREHENSIVE METABOLIC PANEL
ALT: 14 U/L (ref 0–35)
ANION GAP: 13 (ref 5–15)
AST: 19 U/L (ref 0–37)
Albumin: 4.6 g/dL (ref 3.5–5.2)
Alkaline Phosphatase: 56 U/L (ref 39–117)
BUN: 20 mg/dL (ref 6–23)
CO2: 23 mEq/L (ref 19–32)
Calcium: 10 mg/dL (ref 8.4–10.5)
Chloride: 100 mEq/L (ref 96–112)
Creatinine, Ser: 1.13 mg/dL — ABNORMAL HIGH (ref 0.50–1.10)
GFR calc non Af Amer: 62 mL/min — ABNORMAL LOW (ref >90)
GFR, EST AFRICAN AMERICAN: 72 mL/min — AB (ref >90)
GLUCOSE: 130 mg/dL — AB (ref 70–99)
Potassium: 4 mEq/L (ref 3.7–5.3)
Sodium: 136 mEq/L — ABNORMAL LOW (ref 137–147)
Total Bilirubin: 0.4 mg/dL (ref 0.3–1.2)
Total Protein: 9.5 g/dL — ABNORMAL HIGH (ref 6.0–8.3)

## 2014-01-21 LAB — RAPID URINE DRUG SCREEN, HOSP PERFORMED
Amphetamines: NOT DETECTED
BARBITURATES: NOT DETECTED
Benzodiazepines: POSITIVE — AB
Cocaine: NOT DETECTED
OPIATES: NOT DETECTED
TETRAHYDROCANNABINOL: NOT DETECTED

## 2014-01-21 LAB — ACETAMINOPHEN LEVEL: Acetaminophen (Tylenol), Serum: <15 ug/mL (ref 10–30)

## 2014-01-21 LAB — SALICYLATE LEVEL: Salicylate Lvl: <2 mg/dL — ABNORMAL LOW (ref 2.8–20.0)

## 2014-01-21 LAB — ETHANOL

## 2014-01-21 MED ORDER — LORAZEPAM 2 MG/ML IJ SOLN
2.0000 mg | Freq: Once | INTRAMUSCULAR | Status: AC
  Administered 2014-01-21: 2 mg via INTRAMUSCULAR
  Filled 2014-01-21: qty 1

## 2014-01-21 MED ORDER — ZIPRASIDONE MESYLATE 20 MG IM SOLR
20.0000 mg | Freq: Once | INTRAMUSCULAR | Status: AC
  Administered 2014-01-21: 20 mg via INTRAMUSCULAR
  Filled 2014-01-21: qty 20

## 2014-01-21 MED ORDER — TRAZODONE HCL 50 MG PO TABS: 50.0000 mg | ORAL_TABLET | Freq: Every evening | ORAL | Status: DC | PRN

## 2014-01-21 MED ORDER — ONDANSETRON HCL 4 MG PO TABS: 4.0000 mg | ORAL_TABLET | Freq: Three times a day (TID) | ORAL | Status: DC | PRN

## 2014-01-21 MED ORDER — LORAZEPAM 1 MG PO TABS
1.0000 mg | ORAL_TABLET | Freq: Three times a day (TID) | ORAL | Status: DC | PRN
  Administered 2014-01-21: 1 mg via ORAL
  Filled 2014-01-21: qty 1

## 2014-01-21 MED ORDER — ZOLPIDEM TARTRATE 5 MG PO TABS: 5.0000 mg | ORAL_TABLET | Freq: Every evening | ORAL | Status: DC | PRN

## 2014-01-21 MED ORDER — DIPHENHYDRAMINE HCL 50 MG/ML IJ SOLN
50.0000 mg | Freq: Once | INTRAMUSCULAR | Status: AC
  Administered 2014-01-21: 50 mg via INTRAMUSCULAR
  Filled 2014-01-21: qty 1

## 2014-01-21 MED ORDER — ACETAMINOPHEN 325 MG PO TABS: 650.0000 mg | ORAL_TABLET | ORAL | Status: DC | PRN
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
## 2014-01-21 NOTE — ED Notes (Signed)
Patient has glasses on. Patient restless, irritable, argumentative. Denies SI, HI, AVH. Rates anxiety 5/10, denies depression. Reports recent insomnia. States that she only take prescribed Klonopin and Trazodone because the other medications "are not helping". States that she stopped other medications around 2 months ago. Patient states she is here because of her mother.  Encouragement offered. Given Ativan, snack.  Q 15 safety checks in place.

## 2014-01-21 NOTE — ED Notes (Signed)
Patient reports she does not want medication. Making verbal threats to Clinical research associatewriter. Says she does not need medication. Combative, aggressive, excessively loud.  Patient encouraged to rest and see provider in the morning.

## 2014-01-21 NOTE — ED Notes (Signed)
Pt walking in front of nurses station going from one employee to the next and being loud and disruptive.  Writer was walking on fifteen minute check and pt walked up and stated "I'm in here because of my mama.  She's a bitch and I'm going to kill her when I leave here."

## 2014-01-21 NOTE — ED Notes (Signed)
TTS in with patient.  

## 2014-01-21 NOTE — ED Notes (Addendum)
Pt is highly animated and paranoid. States that her mother made her come here for evaluation. Pt did say that she came voluntarily for evaluation. States that she has meds prescribed like Latuda but she refuses to to take them. Pt states she only takes her Klonopin at night. Pt denies SI/HI

## 2014-01-21 NOTE — ED Provider Notes (Signed)
CSN: [id number]     Arrival date & time 01/21/14  2039 History  This chart was scribed for non-physician practitioner working with Barbara CookeyMegan Docherty, MD by [name], ED Scribe. This patient was seen in room WTR4/[facility]PT4 and the patient's care was started at 9:25 PM.   Chief Complaint  Patient presents with  . Manic Behavior   LEVEL 5 CAVEAT - PSYCHIATRIC DISORDER   HPI HPI Comments: [name] is a 38 y.o. female who presents to the Emergency Department with manic behavior. Patient is highly animated and paranoid. States that her mother made her come here for evaluation. Pt did say that she came voluntarily for evaluation but is adamant that she does not want to have to stay. States that she took her medications yesterday but had been noncompliant prior to this. History from brother who is at bedside - patient has been waking neighbors and roommate through the night, has not been sleeping for the past 2-3 days. Pt states she only takes her Klonopin at night. Pt denies SI or HI.   Past Medical History  Diagnosis Date  . Bipolar affective   . OCD (obsessive compulsive disorder)   . Anxiety    History reviewed. No pertinent past surgical history. Family History  Problem Relation Age of Onset  . Mental illness Neg Hx    History  Substance Use Topics  . Smoking status: Current Every Day Smoker -- 0.25 packs/day    Types: Cigarettes  . Smokeless tobacco: Not on file  . Alcohol Use: Yes   OB History    No data available     Review of Systems  Unable to perform ROS: Psychiatric disorder  Psychiatric/Behavioral: Negative for suicidal ideas. The patient is hyperactive.       Allergies  Depakote  Home Medications   Prior to Admission medications   Medication Sig Start Date End Date Taking? Authorizing Provider  clonazePAM (KLONOPIN) 1 MG tablet Take 1 mg by mouth at bedtime. 01/06/14  Yes Historical Provider, MD  traZODone (DESYREL) 50 MG tablet Take 50 mg by mouth at  bedtime. 01/06/14  Yes Historical Provider, MD   BP 147/72 mmHg  Pulse 106  Temp(Src) 98.1 F (36.7 C) (Oral)  Resp 26  SpO2 100% Physical Exam  Constitutional: She is oriented to person, place, and time. She appears well-developed and well-nourished.  HENT:  Head: Normocephalic and atraumatic.  Neck: Normal range of motion. Neck supple. No tracheal deviation present.  Cardiovascular: Normal rate.   Pulmonary/Chest: Effort normal. No respiratory distress.  Abdominal: She exhibits no distension. There is no tenderness.  Neurological: She is alert and oriented to person, place, and time.  Skin: Skin is warm and dry.  Psychiatric: She is agitated and hyperactive. She is not actively hallucinating. Thought content is delusional. She expresses impulsivity.  Nursing note and vitals reviewed.   ED Course  Procedures  DIAGNOSTIC STUDIES: Oxygen Saturation is 100% on RA, normal by my interpretation.    COORDINATION OF CARE: 9:30 PM Discussed treatment plan with pt at bedside and pt agreed to plan.   Labs Review Labs Reviewed  CBC - Abnormal; Notable for the following:    Hemoglobin 10.6 (*)    HCT 32.7 (*)    MCV 77.3 (*)    MCH 25.1 (*)    Platelets 419 (*)    All other components within normal limits  COMPREHENSIVE METABOLIC PANEL - Abnormal; Notable for the following:    Sodium 136 (*)  Glucose, Bld 130 (*)    Creatinine, Ser 1.13 (*)    Total Protein 9.5 (*)    GFR calc non Af Amer 62 (*)    GFR calc Af Amer 72 (*)    All other components within normal limits  SALICYLATE LEVEL - Abnormal; Notable for the following:    Salicylate Lvl <2.0 (*)    All other components within normal limits  URINE RAPID DRUG SCREEN (HOSP PERFORMED) - Abnormal; Notable for the following:    Benzodiazepines POSITIVE (*)    All other components within normal limits  ACETAMINOPHEN LEVEL  ETHANOL    Imaging Review No results found.   EKG Interpretation None      MDM   Final  diagnoses:  None  1. Mania  Patient's mother went to magistrate and has IVC paperwork in place which has been served in the ED. She is awaiting [facility] evaluation and disposition. .  I personally performed the services described in this documentation, which was scribed in my presence. The recorded information has been reviewed and is accurate.      Barbara HookerShari A Seniah Lawrence, PA-C 01/21/14 2322  Barbara CookeyMegan Docherty, MD 01/22/14 1118
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
## 2014-01-22 ENCOUNTER — Encounter (HOSPITAL_COMMUNITY): Payer: Self-pay | Admitting: Registered Nurse

## 2014-01-22 DIAGNOSIS — Z9114 Patient's other noncompliance with medication regimen: Secondary | ICD-10-CM | POA: Diagnosis present

## 2014-01-22 DIAGNOSIS — F3112 Bipolar disorder, current episode manic without psychotic features, moderate: Secondary | ICD-10-CM

## 2014-01-22 DIAGNOSIS — Z62810 Personal history of physical and sexual abuse in childhood: Secondary | ICD-10-CM | POA: Diagnosis present

## 2014-01-22 DIAGNOSIS — R45850 Homicidal ideations: Secondary | ICD-10-CM | POA: Diagnosis not present

## 2014-01-22 DIAGNOSIS — R45851 Suicidal ideations: Secondary | ICD-10-CM | POA: Diagnosis present

## 2014-01-22 DIAGNOSIS — F200 Paranoid schizophrenia: Secondary | ICD-10-CM | POA: Diagnosis not present

## 2014-01-22 DIAGNOSIS — F3160 Bipolar disorder, current episode mixed, unspecified: Secondary | ICD-10-CM | POA: Diagnosis present

## 2014-01-22 NOTE — ED Notes (Signed)
Pt accepted to [facility] under the services of Dr. Wendall StadeKohl. The number for report is [phone]
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
## 2014-01-22 NOTE — Discharge Instructions (Signed)

## 2014-01-22 NOTE — BH Assessment (Signed)
Per [name], PA, pt meets inpatient criteria. Triage Specialist spoke with Delorise Jacksonori (RN), whom stated that [facility]. TTS will seek placement elsewhere. Pt has been extremely aggressive and verbally aggressive.    Cyndie MullAnna Chike Farrington, [facility] Triage Specialist

## 2014-01-22 NOTE — BH Assessment (Signed)
Inpt recommended, no [facility] beds available. Sent referrals to the following facilities for possible [facility]    Chandra Asher, [place] Triage Specialist 01/22/2014 5:53 AM

## 2014-02-19 DIAGNOSIS — F314 Bipolar disorder, current episode depressed, severe, without psychotic features: Secondary | ICD-10-CM | POA: Diagnosis not present

## 2014-03-12 DIAGNOSIS — F314 Bipolar disorder, current episode depressed, severe, without psychotic features: Secondary | ICD-10-CM | POA: Diagnosis not present

## 2014-03-16 ENCOUNTER — Emergency Department (HOSPITAL_COMMUNITY)
Admission: EM | Admit: 2014-03-16 | Discharge: 2014-03-21 | Disposition: A | Discharge status: Home / Self Care | Payer: Medicare Other | Attending: Emergency Medicine | Admitting: Emergency Medicine

## 2014-03-16 ENCOUNTER — Encounter (HOSPITAL_COMMUNITY): Payer: Self-pay | Admitting: Emergency Medicine

## 2014-03-16 DIAGNOSIS — F309 Manic episode, unspecified: Secondary | ICD-10-CM | POA: Insufficient documentation

## 2014-03-16 DIAGNOSIS — Z720 Tobacco use: Secondary | ICD-10-CM | POA: Insufficient documentation

## 2014-03-16 DIAGNOSIS — Z79899 Other long term (current) drug therapy: Secondary | ICD-10-CM | POA: Insufficient documentation

## 2014-03-16 DIAGNOSIS — F411 Generalized anxiety disorder: Secondary | ICD-10-CM | POA: Diagnosis not present

## 2014-03-16 DIAGNOSIS — F419 Anxiety disorder, unspecified: Secondary | ICD-10-CM | POA: Insufficient documentation

## 2014-03-16 DIAGNOSIS — F42 Obsessive-compulsive disorder: Secondary | ICD-10-CM | POA: Diagnosis not present

## 2014-03-16 DIAGNOSIS — F3113 Bipolar disorder, current episode manic without psychotic features, severe: Secondary | ICD-10-CM | POA: Diagnosis not present

## 2014-03-16 DIAGNOSIS — F4310 Post-traumatic stress disorder, unspecified: Secondary | ICD-10-CM | POA: Diagnosis not present

## 2014-03-16 DIAGNOSIS — F3110 Bipolar disorder, current episode manic without psychotic features, unspecified: Secondary | ICD-10-CM | POA: Diagnosis not present

## 2014-03-16 LAB — COMPREHENSIVE METABOLIC PANEL
ALK PHOS: 55 U/L (ref 39–117)
ALT: 17 U/L (ref 0–35)
AST: 23 U/L (ref 0–37)
Albumin: 4.9 g/dL (ref 3.5–5.2)
Anion gap: 7 (ref 5–15)
BUN: 18 mg/dL (ref 6–23)
CALCIUM: 9.4 mg/dL (ref 8.4–10.5)
CO2: 22 mmol/L (ref 19–32)
CREATININE: 1.1 mg/dL (ref 0.50–1.10)
Chloride: 110 mEq/L (ref 96–112)
GFR calc Af Amer: 74 mL/min — ABNORMAL LOW (ref >90)
GFR, EST NON AFRICAN AMERICAN: 64 mL/min — AB (ref >90)
Glucose, Bld: 121 mg/dL — ABNORMAL HIGH (ref 70–99)
POTASSIUM: 4 mmol/L (ref 3.5–5.1)
Sodium: 139 mmol/L (ref 135–145)
Total Bilirubin: 0.3 mg/dL (ref 0.3–1.2)
Total Protein: 9.3 g/dL — ABNORMAL HIGH (ref 6.0–8.3)

## 2014-03-16 LAB — CBC
HCT: 33.6 % — ABNORMAL LOW (ref 36.0–46.0)
Hemoglobin: 10.4 g/dL — ABNORMAL LOW (ref 12.0–15.0)
MCH: 24 pg — ABNORMAL LOW (ref 26.0–34.0)
MCHC: 31 g/dL (ref 30.0–36.0)
MCV: 77.6 fL — AB (ref 78.0–100.0)
PLATELETS: 368 10*3/uL (ref 150–400)
RBC: 4.33 MIL/uL (ref 3.87–5.11)
RDW: 16.7 % — ABNORMAL HIGH (ref 11.5–15.5)
WBC: 5.8 10*3/uL (ref 4.0–10.5)

## 2014-03-16 LAB — ACETAMINOPHEN LEVEL

## 2014-03-16 LAB — ETHANOL

## 2014-03-16 LAB — SALICYLATE LEVEL: Salicylate Lvl: <4 mg/dL (ref 2.8–20.0)

## 2014-03-16 MED ORDER — ZOLPIDEM TARTRATE 5 MG PO TABS
5.0000 mg | ORAL_TABLET | Freq: Every evening | ORAL | Status: DC | PRN
  Administered 2014-03-18: 5 mg via ORAL
  Filled 2014-03-16 (×2): qty 1

## 2014-03-16 MED ORDER — IBUPROFEN 200 MG PO TABS: 600.0000 mg | ORAL_TABLET | Freq: Three times a day (TID) | ORAL | Status: DC | PRN

## 2014-03-16 MED ORDER — TRAZODONE HCL 50 MG PO TABS
50.0000 mg | ORAL_TABLET | Freq: Every day | ORAL | Status: DC
  Administered 2014-03-17 – 2014-03-20 (×5): 50 mg via ORAL
  Filled 2014-03-16 (×5): qty 1

## 2014-03-16 MED ORDER — CLONAZEPAM 1 MG PO TABS
1.0000 mg | ORAL_TABLET | Freq: Every day | ORAL | Status: DC
  Administered 2014-03-17 – 2014-03-20 (×5): 1 mg via ORAL
  Filled 2014-03-16: qty 1
  Filled 2014-03-16: qty 2
  Filled 2014-03-16 (×3): qty 1

## 2014-03-16 MED ORDER — NICOTINE 21 MG/24HR TD PT24
21.0000 mg | MEDICATED_PATCH | Freq: Every day | TRANSDERMAL | Status: DC
  Filled 2014-03-16 (×2): qty 1

## 2014-03-16 MED ORDER — ACETAMINOPHEN 325 MG PO TABS: 650.0000 mg | ORAL_TABLET | ORAL | Status: DC | PRN

## 2014-03-16 NOTE — BH Assessment (Signed)
Spoke with Barbara ChessGailSchulz, NP prior to initiating assessment. Per Barbara FavorGail Schulz, NP pt is manic and has been off of her medications for 10 days. No SI/HI reported at this time.   [name], [facility] Triage Specialist 03/16/2014 11:23 PM

## 2014-03-16 NOTE — ED Notes (Signed)
All previous charting up to this point was placed in error. Haywood PaoKenzie Tieshia Rettinger RN charted under Jenene SlickerJeremy Maness EMT name accidentally. Disregard charting. Will redo

## 2014-03-16 NOTE — ED Provider Notes (Signed)
CSN: [id number]     Arrival date & time 03/16/14  2120 History  This chart was scribed for non-physician practitioner, Earley FavorGail Min Tunnell, FNP working with Hanley SeamenJohn L Molpus, MD by Greggory StallionKayla Andersen, ED scribe. This patient was seen in room [facility] and the patient's care was started at 11:04 PM.   Chief Complaint  Patient presents with  . Manic Behavior   The history is provided by the patient. No language interpreter was used.    HPI Comments: [name] is a 39 y.o. female who presents to the Emergency Department complaining of manic behavior. Pt's brother states pt hasn't taken her medications in 10 days. Pt states the last time she took them was earlier today. She stopped taking them because they weren't working. Pt last saw her psychiatrist one month ago and has an appointment in 4 days.   Past Medical History  Diagnosis Date  . Bipolar affective   . OCD (obsessive compulsive disorder)   . Anxiety    History reviewed. No pertinent past surgical history. Family History  Problem Relation Age of Onset  . Mental illness Neg Hx    History  Substance Use Topics  . Smoking status: Current Every Day Smoker -- 0.25 packs/day    Types: Cigarettes  . Smokeless tobacco: Not on file  . Alcohol Use: Yes   OB History    No data available     Review of Systems  Psychiatric/Behavioral: Positive for dysphoric mood. The patient is hyperactive.        Manic  All other systems reviewed and are negative.  Allergies  Depakote  Home Medications   Prior to Admission medications   Medication Sig Start Date End Date Taking? Authorizing Provider  clonazePAM (KLONOPIN) 1 MG tablet Take 1 mg by mouth at bedtime. 01/06/14  Yes Historical Provider, MD  traZODone (DESYREL) 50 MG tablet Take 50 mg by mouth at bedtime. 01/06/14   Historical Provider, MD   BP 130/78 mmHg  Pulse 87  Temp(Src) 98.2 F (36.8 C) (Oral)  Resp 16  SpO2 100%  LMP 12/14/2013   Physical Exam  Constitutional: She is  oriented to person, place, and time. She appears well-developed and well-nourished. No distress.  HENT:  Head: Normocephalic and atraumatic.  Eyes: Conjunctivae and EOM are normal.  Neck: Neck supple. No tracheal deviation present.  Cardiovascular: Normal rate.   Pulmonary/Chest: Effort normal. No respiratory distress.  Musculoskeletal: Normal range of motion.  Neurological: She is alert and oriented to person, place, and time.  Skin: Skin is warm and dry.  Psychiatric: Her behavior is normal.  Manic  Nursing note and vitals reviewed.   ED Course  Procedures (including critical care time)  DIAGNOSTIC STUDIES: Oxygen Saturation is 100% on RA, normal by my interpretation.    COORDINATION OF CARE: 11:08 PM-Discussed treatment plan which includes lab work and speaking with behavioral health with pt at bedside and pt agreed to plan.   Labs Review Labs Reviewed  ACETAMINOPHEN LEVEL - Abnormal; Notable for the following:    Acetaminophen (Tylenol), Serum <10.0 (*)    All other components within normal limits  CBC - Abnormal; Notable for the following:    Hemoglobin 10.4 (*)    HCT 33.6 (*)    MCV 77.6 (*)    MCH 24.0 (*)    RDW 16.7 (*)    All other components within normal limits  COMPREHENSIVE METABOLIC PANEL - Abnormal; Notable for the following:    Glucose, Bld 121 (*)  Total Protein 9.3 (*)    GFR calc non Af Amer 64 (*)    GFR calc Af Amer 74 (*)    All other components within normal limits  ETHANOL  SALICYLATE LEVEL  URINE RAPID DRUG SCREEN (HOSP PERFORMED)    Imaging Review No results found.   EKG Interpretation None      MDM   Final diagnoses:  None       I personally performed the services described in this documentation, which was scribed in my presence. The recorded information has been reviewed and is accurate.  Arman Filter, NP 03/17/14 2032  Arman Filter, NP 03/17/14 2038  Hanley Seamen, MD 03/17/14 [phone]

## 2014-03-16 NOTE — ED Notes (Signed)
Pt states she doesn't know why she's here. That her brother noticed her getting more manic over the last day, and that her roommate called her mother to tell her the same thing. Pt agitated, speaking loudly and quickly. Brother states she hasn't been taking her medication over the last week.  

## 2014-03-16 NOTE — ED Notes (Signed)
Pt states she doesn't know why she's here. That her brother noticed her getting more manic over the last day, and that her roommate called her mother to tell her the same thing. Pt agitated, speaking loudly and quickly. Brother states she hasn't been taking her medication over the last week.

## 2014-03-17 DIAGNOSIS — F4310 Post-traumatic stress disorder, unspecified: Secondary | ICD-10-CM | POA: Diagnosis not present

## 2014-03-17 DIAGNOSIS — F3113 Bipolar disorder, current episode manic without psychotic features, severe: Secondary | ICD-10-CM | POA: Insufficient documentation

## 2014-03-17 DIAGNOSIS — F411 Generalized anxiety disorder: Secondary | ICD-10-CM | POA: Diagnosis not present

## 2014-03-17 DIAGNOSIS — F309 Manic episode, unspecified: Secondary | ICD-10-CM | POA: Diagnosis not present

## 2014-03-17 MED ORDER — ZIPRASIDONE MESYLATE 20 MG IM SOLR
20.0000 mg | Freq: Once | INTRAMUSCULAR | Status: AC
  Administered 2014-03-17: 20 mg via INTRAMUSCULAR
  Filled 2014-03-17: qty 20

## 2014-03-17 MED ORDER — LORAZEPAM 1 MG PO TABS
2.0000 mg | ORAL_TABLET | Freq: Once | ORAL | Status: AC
  Administered 2014-03-17: 2 mg via ORAL
  Filled 2014-03-17: qty 2

## 2014-03-17 MED ORDER — CARBAMAZEPINE 200 MG PO TABS
200.0000 mg | ORAL_TABLET | Freq: Two times a day (BID) | ORAL | Status: DC
  Administered 2014-03-19 – 2014-03-21 (×5): 200 mg via ORAL
  Filled 2014-03-17 (×10): qty 1

## 2014-03-17 MED ORDER — LORAZEPAM 1 MG PO TABS
1.0000 mg | ORAL_TABLET | Freq: Four times a day (QID) | ORAL | Status: DC | PRN
  Administered 2014-03-17 – 2014-03-18 (×3): 1 mg via ORAL
  Filled 2014-03-17 (×3): qty 1

## 2014-03-17 MED ORDER — LORAZEPAM 2 MG/ML IJ SOLN
2.0000 mg | Freq: Once | INTRAMUSCULAR | Status: AC
  Administered 2014-03-17: 2 mg via INTRAMUSCULAR
  Filled 2014-03-17: qty 1

## 2014-03-17 MED ORDER — BENZTROPINE MESYLATE 1 MG PO TABS
1.0000 mg | ORAL_TABLET | Freq: Every day | ORAL | Status: DC
  Administered 2014-03-19 – 2014-03-21 (×3): 1 mg via ORAL
  Filled 2014-03-17 (×3): qty 1

## 2014-03-17 MED ORDER — PALIPERIDONE ER 6 MG PO TB24
6.0000 mg | ORAL_TABLET | Freq: Every day | ORAL | Status: DC
  Administered 2014-03-19 – 2014-03-21 (×3): 6 mg via ORAL
  Filled 2014-03-17 (×5): qty 1

## 2014-03-17 MED ORDER — DIPHENHYDRAMINE HCL 50 MG/ML IJ SOLN
50.0000 mg | Freq: Once | INTRAMUSCULAR | Status: AC
  Administered 2014-03-17: 50 mg via INTRAMUSCULAR
  Filled 2014-03-17: qty 1

## 2014-03-17 NOTE — ED Notes (Signed)
Back in room 42.

## 2014-03-17 NOTE — ED Notes (Signed)
Reports trouble sleeping. Continues to blame hospitalization on her mother.  Encouragement offered. Ordered medications given.   Q 15 safety checks continue.

## 2014-03-17 NOTE — Progress Notes (Signed)
Pt referral faxed to the following facilities who reported having bed availability:  [place]  [facility]  [facility]  [facility]  [facility]  [facility]  [facility]  Will continue to seek placement.  Chad CordialLauren Carter, LCSWA 03/17/2014 1:33 PM
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
## 2014-03-17 NOTE — ED Notes (Signed)
Patient continues to go into room 42. Has been asked multiple times to stay out of other patient's rooms.

## 2014-03-17 NOTE — ED Notes (Signed)
Patient has been resting quietly since about noon.  Lunch has been held for her as it was decided not to wake her up when trays arrived.

## 2014-03-17 NOTE — ED Notes (Signed)
Patient has been loud, intrusive and difficult to re-direct today.  She has gone into other patient rooms and spends a great deal of time in front of the nursing station asking staff about relationship status, monetary status, etc.  She refuses all medications offered and accuses staff of using her for a [place] when medication is offered.
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
## 2014-03-17 NOTE — ED Notes (Signed)
Patient given injection of ziprasadone, diphenhydramine, and lorazepam.  She was informed that the medications will likely make her sleepy and that is would be best for her to lie down and let them work.

## 2014-03-17 NOTE — ED Notes (Signed)
Back at room 42 doorway.

## 2014-03-17 NOTE — ED Notes (Signed)
Patient denies SI, HI, AVH. Appears restless, mildly manic. Cooperative at present. Blames her mother for her being brought to the hospital. Denies feelings of anxiety, rates depression 5/10. Reports that she has no problems with sleep. Has decreased appetite in recent weeks.   Encouragement offered. Meal provided. Patient reminded to provide urine sample.   Q 15 safety checks in place.

## 2014-03-17 NOTE — ED Notes (Signed)
[id number] Ex[facility] date:  Expected time:  Means of arrival:  Comments: Tri Con

## 2014-03-17 NOTE — B[facility] Counselor (Signed)
Magistrate Walker confirmed receipt of IVC paperwork. IVC originals placed in IVC SAPPU log and copies placed in pt's chart.   [name], LCSWA Assessment Counselor   

## 2014-03-18 DIAGNOSIS — F309 Manic episode, unspecified: Secondary | ICD-10-CM | POA: Diagnosis not present

## 2014-03-18 DIAGNOSIS — F3110 Bipolar disorder, current episode manic without psychotic features, unspecified: Secondary | ICD-10-CM | POA: Insufficient documentation

## 2014-03-18 DIAGNOSIS — F411 Generalized anxiety disorder: Secondary | ICD-10-CM | POA: Diagnosis not present

## 2014-03-18 DIAGNOSIS — F4310 Post-traumatic stress disorder, unspecified: Secondary | ICD-10-CM | POA: Diagnosis not present

## 2014-03-18 MED ORDER — DIPHENHYDRAMINE HCL 25 MG PO CAPS
50.0000 mg | ORAL_CAPSULE | Freq: Once | ORAL | Status: AC
  Administered 2014-03-18: 50 mg via ORAL
  Filled 2014-03-18: qty 2

## 2014-03-18 MED ORDER — CHLORPROMAZINE HCL 25 MG PO TABS: 100.0000 mg | ORAL_TABLET | Freq: Four times a day (QID) | ORAL | Status: DC | PRN

## 2014-03-18 MED ORDER — CHLORPROMAZINE HCL 25 MG/ML IJ SOLN
50.0000 mg | Freq: Once | INTRAMUSCULAR | Status: AC
  Administered 2014-03-18: 50 mg via INTRAMUSCULAR
  Filled 2014-03-18: qty 2

## 2014-03-18 MED ORDER — CHLORPROMAZINE HCL 25 MG PO TABS
50.0000 mg | ORAL_TABLET | Freq: Four times a day (QID) | ORAL | Status: DC | PRN
Start: 2014-03-18 — End: 2014-03-18

## 2014-03-18 MED ORDER — WHITE PETROLATUM GEL: Status: DC | PRN

## 2014-03-18 MED ORDER — WHITE PETROLATUM GEL
Status: DC | PRN
  Administered 2014-03-18: 0.2 via TOPICAL
  Filled 2014-03-18 (×2): qty 28.35

## 2014-03-18 MED ORDER — DIPHENHYDRAMINE HCL 50 MG/ML IJ SOLN
50.0000 mg | Freq: Once | INTRAMUSCULAR | Status: AC
Start: 2014-03-18 — End: 2014-03-18
  Administered 2014-03-18: 50 mg via INTRAMUSCULAR

## 2014-03-18 MED ORDER — CHLORPROMAZINE HCL 25 MG PO TABS
100.0000 mg | ORAL_TABLET | Freq: Four times a day (QID) | ORAL | Status: DC | PRN
  Administered 2014-03-19: 100 mg via ORAL
  Filled 2014-03-18 (×2): qty 4

## 2014-03-18 MED ORDER — DIPHENHYDRAMINE HCL 50 MG/ML IJ SOLN
50.0000 mg | Freq: Once | INTRAMUSCULAR | Status: DC
  Filled 2014-03-18: qty 1

## 2014-03-18 MED ORDER — ZIPRASIDONE MESYLATE 20 MG IM SOLR
20.0000 mg | Freq: Once | INTRAMUSCULAR | Status: AC
  Administered 2014-03-18: 20 mg via INTRAMUSCULAR
  Filled 2014-03-18: qty 20

## 2014-03-18 MED ORDER — LORAZEPAM 1 MG PO TABS
2.0000 mg | ORAL_TABLET | Freq: Once | ORAL | Status: AC
  Administered 2014-03-18: 2 mg via ORAL
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
## 2014-03-18 NOTE — ED Notes (Signed)
Patient has constantly been going in and out of room 35 while the Nurse is trying to assess a patient. We have been asking the patient to not go in patients room but she keeps go in and out and pushing there doors open invading their space
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
## 2014-03-18 NOTE — ED Provider Notes (Signed)
Patient agitated, combative, non-directable, and staff voices concern for their safety.  Patient has received Geodon previously, with no noted complications.  Psych has not yet listed additional medication recommendations.    Barbara Munchobert Roneka Gilpin, MD 03/18/14 [phone]

## 2014-03-18 NOTE — Progress Notes (Signed)
[facility]  Chad CordialLauren Carter, LCSWA 03/18/2014 2:46 PM

## 2014-03-18 NOTE — ED Notes (Signed)
Patient is loud,demanding and beligerent.  Not redirectable.  Continuous presence of [employer]/security.

## 2014-03-18 NOTE — ED Notes (Signed)
Patient sleeping. Respirations even and unlabored.  Q 15 safety checks continue.

## 2014-03-19 DIAGNOSIS — F411 Generalized anxiety disorder: Secondary | ICD-10-CM | POA: Diagnosis not present

## 2014-03-19 DIAGNOSIS — F3110 Bipolar disorder, current episode manic without psychotic features, unspecified: Secondary | ICD-10-CM

## 2014-03-19 DIAGNOSIS — F4310 Post-traumatic stress disorder, unspecified: Secondary | ICD-10-CM

## 2014-03-19 DIAGNOSIS — F309 Manic episode, unspecified: Secondary | ICD-10-CM | POA: Diagnosis not present
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
## 2014-03-19 NOTE — ED Notes (Signed)
Pt awake, alert & responsive, no distress noted, pt speaks inappropriately to staff at times, redirectable.  Will continue to monitor for safety.

## 2014-03-20 DIAGNOSIS — F3110 Bipolar disorder, current episode manic without psychotic features, unspecified: Secondary | ICD-10-CM | POA: Diagnosis not present

## 2014-03-20 DIAGNOSIS — F4310 Post-traumatic stress disorder, unspecified: Secondary | ICD-10-CM | POA: Diagnosis not present

## 2014-03-20 DIAGNOSIS — F411 Generalized anxiety disorder: Secondary | ICD-10-CM | POA: Diagnosis not present

## 2014-03-20 DIAGNOSIS — F309 Manic episode, unspecified: Secondary | ICD-10-CM | POA: Diagnosis not present

## 2014-03-20 NOTE — ED Notes (Signed)
Pt visiting with family at present, no distress noted.  Pt cooperative at present,  Will continue to monitor for safety.

## 2014-03-20 NOTE — BH Assessment (Signed)
[facility] Assessment Progress Note  The following contacts have been made in an effort to place this pt with results as noted:  *[facility]: information faxed, decision pending [facility]: no adult female beds available *[place]: call placed, which rolled to voice mail; left message with reply pending.  Doylene Canninghomas Perl Folmar, MA Triage Specialist 03/20/2014 @ 16:24

## 2014-03-20 NOTE — ED Notes (Signed)
Patient took her medications this morning.  She started to decline them and then stated that it would look better if she took them and she would increase her chances of being discharged.  Became angry and irritable when told she was not going home today.
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
## 2014-03-21 DIAGNOSIS — F411 Generalized anxiety disorder: Secondary | ICD-10-CM | POA: Diagnosis not present

## 2014-03-21 DIAGNOSIS — F309 Manic episode, unspecified: Secondary | ICD-10-CM | POA: Diagnosis not present

## 2014-03-21 DIAGNOSIS — F4310 Post-traumatic stress disorder, unspecified: Secondary | ICD-10-CM | POA: Diagnosis not present

## 2014-03-21 DIAGNOSIS — F3112 Bipolar disorder, current episode manic without psychotic features, moderate: Secondary | ICD-10-CM

## 2014-03-21 DIAGNOSIS — F3113 Bipolar disorder, current episode manic without psychotic features, severe: Secondary | ICD-10-CM | POA: Diagnosis not present

## 2014-03-21 MED ORDER — CARBAMAZEPINE 200 MG PO TABS: 200.0000 mg | ORAL_TABLET | Freq: Two times a day (BID) | ORAL | Status: DC

## 2014-03-21 MED ORDER — PALIPERIDONE ER 6 MG PO TB24: 6.0000 mg | ORAL_TABLET | Freq: Every day | ORAL | Status: DC

## 2014-03-21 MED ORDER — CHLORPROMAZINE HCL 100 MG PO TABS: 100.0000 mg | ORAL_TABLET | Freq: Four times a day (QID) | ORAL | Status: DC | PRN

## 2014-03-21 NOTE — Progress Notes (Signed)
Patient ID: Teressa SenterChanda D Gritton, female   DOB: Aug 03, 1975, 39 y.o.   MRN: [id number]                  Psychiatry MD Note  Patient seen, chart reviewed and case discussed with Dr. Jannifer FranklinAkintayo. Reportedly patient presented with severe symptoms of mania with non compliant with her home medication more than ten days, grandiose, intrusive, interruptive, poor insight, judgment and impulse control. She has been highly disruptive to therapeutic milieu. Patient initially refused medication but later she has been compliant with her prescribed medication and showing clinical improvement. She has plans of discharge from ER and following up with her psychiatrist Dr. Gwyndolyn KaufmanSenna. Patient does not required second opinion for forced medication at this time as she has been already compliant with medication treatment.     Pierina Schuknecht,JANARDHAHA R. 03/21/2014 9:33 AM

## 2014-03-21 NOTE — ED Notes (Signed)
Patient discharged home per MD order. Patient denies any SI/HI/AVH.  She will follow up with outside resources.  Patient has been compliant with her medications.  Patient received discharge instructions and prescriptions.

## 2014-03-21 NOTE — Discharge Instructions (Signed)
For your ongoing mental health needs you are advised to continue your treatment at the [facility].  You are scheduled for two appointments on Monday, 03/24/2014.  At 3:30 pm you will see a psychiatrist, and at 5:00 pm you will see a therapist:       [facility]      [address]      [place], [place] [address]      [phone]

## 2014-03-21 NOTE — [facility] Suicide Risk Assessment (Cosign Needed)
Suicide Risk Assessment  Discharge Assessment     Demographic Factors:  Low socioeconomic status and Unemployed  Total Time spent with patient: 30 minutes  Psychiatric Specialty Exam:     Blood pressure 104/75, pulse 87, temperature 98.2 F (36.8 C), temperature source Oral, resp. rate 14, last menstrual period 12/14/2013, SpO2 98 %.There is no weight on file to calculate BMI.  General Appearance: Casual  Eye Contact::  Good  Speech:  Clear and Coherent and Normal Rate  Volume:  Normal  Mood:  Depressed  Affect:  Congruent and Depressed  Thought Process:  Coherent, Goal Directed and Intact  Orientation:  Full (Time, Place, and Person)  Thought Content:  WDL  Suicidal Thoughts:  No  Homicidal Thoughts:  No  Memory:  Immediate;   Good Recent;   Good Remote;   Good  Judgement:  Fair  Insight:  Good  Psychomotor Activity:  Normal  Concentration:  Good  Recall:  NA  Fund of Knowledge:Fair  Language: Good  Akathisia:  NA  Handed:  Right  AIMS (if indicated):     Assets:  Desire for Improvement  Sleep:       Musculoskeletal: Strength & Muscle Tone: within normal limits Gait & Station: normal Patient leans: N/A   Mental Status Per Nursing Assessment::   On Admission:     Current Mental Status by Physician: NA  Loss Factors: NA  Historical Factors: NA  Risk Reduction Factors:   Religious beliefs about death, Living with another person, especially a relative and Positive social support  Continued Clinical Symptoms:  Bipolar Disorder:   Mixed State  Cognitive Features That Contribute To Risk:  Polarized thinking    Suicide Risk:  Minimal: No identifiable suicidal ideation.  Patients presenting with no risk factors but with morbid ruminations; may be classified as minimal risk based on the severity of the depressive symptoms  Discharge Diagnoses:   AXIS I:  Bipolar, Manic AXIS II:  Deferred AXIS III:   Past Medical History  Diagnosis Date  . Bipolar  affective   . OCD (obsessive compulsive disorder)   . Anxiety    AXIS IV:  other psychosocial or environmental problems and problems related to social environment AXIS V:  51-60 moderate symptoms  Plan Of Care/Follow-up recommendations:  Activity:  As tolerated Diet:  Regular  Is patient on multiple antipsychotic therapies at discharge:  No   Has Patient had three or more failed trials of antipsychotic monotherapy by history:  No  Recommended Plan for Multiple Antipsychotic Therapies: NA    Dahlia ByesONUOHA, Stanlee Roehrig, C   PMHNP-BC 03/21/2014, 12:48 PM
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
## 2014-03-21 NOTE — ED Notes (Signed)
Patient awaiting transportation.  Mother is on the way to pick her up.

## 2014-03-21 NOTE — BH Assessment (Signed)
[facility] Assessment Progress Note  Per Thedore MinsMojeed Akintayo, MD, pt [facility] and is to follow up with [facility], her outpatient provider. I then spoke to the pt, who signed Consent to Release information to [facility].  At 11:15 [facility], who confirms that pt has appointments on 03/24/2014 to see a psychiatrist at 15:30 and a therapist at 17:00.  This information will be included in pt's discharge instructions.  Doylene Canninghomas Lakrista Scaduto, MA  Triage Specialist  03/21/2014 @ 11:34

## 2014-03-21 NOTE — ED Notes (Signed)
Patient is cooperative; her mood is labile.  Patient was compliant with her medications stating, "I'm taking them so I can go home."  Patient met with treatment team and she will going home today.  She denies any SI/HI/AVH.

## 2014-03-24 DIAGNOSIS — F314 Bipolar disorder, current episode depressed, severe, without psychotic features: Secondary | ICD-10-CM | POA: Diagnosis not present

## 2014-04-16 DIAGNOSIS — F314 Bipolar disorder, current episode depressed, severe, without psychotic features: Secondary | ICD-10-CM | POA: Diagnosis not present

## 2014-05-08 DIAGNOSIS — F314 Bipolar disorder, current episode depressed, severe, without psychotic features: Secondary | ICD-10-CM | POA: Diagnosis not present

## 2014-05-14 DIAGNOSIS — F314 Bipolar disorder, current episode depressed, severe, without psychotic features: Secondary | ICD-10-CM | POA: Diagnosis not present

## 2014-06-18 ENCOUNTER — Emergency Department (HOSPITAL_COMMUNITY)
Admission: EM | Admit: 2014-06-18 | Discharge: 2014-06-19 | Disposition: A | Discharge status: Home / Self Care | Payer: Medicare Other | Attending: Emergency Medicine | Admitting: Emergency Medicine

## 2014-06-18 ENCOUNTER — Encounter (HOSPITAL_COMMUNITY): Payer: Self-pay

## 2014-06-18 DIAGNOSIS — F309 Manic episode, unspecified: Secondary | ICD-10-CM | POA: Diagnosis not present

## 2014-06-18 DIAGNOSIS — F22 Delusional disorders: Secondary | ICD-10-CM | POA: Insufficient documentation

## 2014-06-18 DIAGNOSIS — F42 Obsessive-compulsive disorder: Secondary | ICD-10-CM | POA: Insufficient documentation

## 2014-06-18 DIAGNOSIS — F3010 Manic episode without psychotic symptoms, unspecified: Secondary | ICD-10-CM

## 2014-06-18 DIAGNOSIS — F3113 Bipolar disorder, current episode manic without psychotic features, severe: Secondary | ICD-10-CM | POA: Diagnosis present

## 2014-06-18 DIAGNOSIS — R000 Tachycardia, unspecified: Secondary | ICD-10-CM | POA: Insufficient documentation

## 2014-06-18 DIAGNOSIS — F419 Anxiety disorder, unspecified: Secondary | ICD-10-CM | POA: Diagnosis not present

## 2014-06-18 DIAGNOSIS — F911 Conduct disorder, childhood-onset type: Secondary | ICD-10-CM | POA: Diagnosis not present

## 2014-06-18 DIAGNOSIS — Z720 Tobacco use: Secondary | ICD-10-CM | POA: Diagnosis not present

## 2014-06-18 DIAGNOSIS — F1310 Sedative, hypnotic or anxiolytic abuse, uncomplicated: Secondary | ICD-10-CM | POA: Insufficient documentation

## 2014-06-18 DIAGNOSIS — F3110 Bipolar disorder, current episode manic without psychotic features, unspecified: Secondary | ICD-10-CM

## 2014-06-18 DIAGNOSIS — Z79899 Other long term (current) drug therapy: Secondary | ICD-10-CM | POA: Diagnosis not present

## 2014-06-18 LAB — SALICYLATE LEVEL

## 2014-06-18 LAB — CBC
HEMATOCRIT: 36.3 % (ref 36.0–46.0)
Hemoglobin: 12.2 g/dL (ref 12.0–15.0)
MCH: 26.1 pg (ref 26.0–34.0)
MCHC: 33.6 g/dL (ref 30.0–36.0)
MCV: 77.6 fL — ABNORMAL LOW (ref 78.0–100.0)
PLATELETS: 429 10*3/uL — AB (ref 150–400)
RBC: 4.68 MIL/uL (ref 3.87–5.11)
RDW: 16.8 % — ABNORMAL HIGH (ref 11.5–15.5)
WBC: 9.3 10*3/uL (ref 4.0–10.5)

## 2014-06-18 LAB — COMPREHENSIVE METABOLIC PANEL
ALT: 20 U/L (ref 0–35)
ANION GAP: 9 (ref 5–15)
AST: 31 U/L (ref 0–37)
Albumin: 5.1 g/dL (ref 3.5–5.2)
Alkaline Phosphatase: 66 U/L (ref 39–117)
BUN: 18 mg/dL (ref 6–23)
CO2: 23 mmol/L (ref 19–32)
Calcium: 9.4 mg/dL (ref 8.4–10.5)
Chloride: 105 mmol/L (ref 96–112)
Creatinine, Ser: 1.3 mg/dL — ABNORMAL HIGH (ref 0.50–1.10)
GFR calc Af Amer: 60 mL/min — ABNORMAL LOW (ref >90)
GFR calc non Af Amer: 52 mL/min — ABNORMAL LOW (ref >90)
Glucose, Bld: 110 mg/dL — ABNORMAL HIGH (ref 70–99)
POTASSIUM: 4 mmol/L (ref 3.5–5.1)
Sodium: 137 mmol/L (ref 135–145)
TOTAL PROTEIN: 10.1 g/dL — AB (ref 6.0–8.3)
Total Bilirubin: 0.7 mg/dL (ref 0.3–1.2)

## 2014-06-18 LAB — ETHANOL

## 2014-06-18 LAB — ACETAMINOPHEN LEVEL: Acetaminophen (Tylenol), Serum: <10 ug/mL — ABNORMAL LOW (ref 10–30)

## 2014-06-18 MED ORDER — CHLORPROMAZINE HCL 25 MG PO TABS
100.0000 mg | ORAL_TABLET | Freq: Four times a day (QID) | ORAL | Status: DC | PRN
  Filled 2014-06-18: qty 4

## 2014-06-18 MED ORDER — LORAZEPAM 1 MG PO TABS: 1.0000 mg | ORAL_TABLET | Freq: Three times a day (TID) | ORAL | Status: DC | PRN

## 2014-06-18 MED ORDER — IBUPROFEN 200 MG PO TABS: 600.0000 mg | ORAL_TABLET | Freq: Three times a day (TID) | ORAL | Status: DC | PRN

## 2014-06-18 MED ORDER — TRAZODONE HCL 50 MG PO TABS: 50.0000 mg | ORAL_TABLET | Freq: Every day | ORAL | Status: DC

## 2014-06-18 MED ORDER — ALUM & MAG HYDROXIDE-SIMETH 200-200-20 MG/5ML PO SUSP
30.0000 mL | ORAL | Status: DC | PRN
  Filled 2014-06-18: qty 30

## 2014-06-18 MED ORDER — CLONAZEPAM 1 MG PO TABS
1.0000 mg | ORAL_TABLET | Freq: Every day | ORAL | Status: DC
  Administered 2014-06-18: 1 mg via ORAL
  Filled 2014-06-18: qty 1

## 2014-06-18 MED ORDER — ACETAMINOPHEN 325 MG PO TABS
650.0000 mg | ORAL_TABLET | ORAL | Status: DC | PRN
  Filled 2014-06-18: qty 2

## 2014-06-18 MED ORDER — NICOTINE 21 MG/24HR TD PT24: 21.0000 mg | MEDICATED_PATCH | Freq: Every day | TRANSDERMAL | Status: DC

## 2014-06-18 MED ORDER — PALIPERIDONE ER 6 MG PO TB24
6.0000 mg | ORAL_TABLET | Freq: Every day | ORAL | Status: DC
  Administered 2014-06-19: 6 mg via ORAL
  Filled 2014-06-18 (×2): qty 1

## 2014-06-18 MED ORDER — ONDANSETRON HCL 4 MG PO TABS: 4.0000 mg | ORAL_TABLET | Freq: Three times a day (TID) | ORAL | Status: DC | PRN

## 2014-06-18 MED ORDER — CARBAMAZEPINE 200 MG PO TABS
200.0000 mg | ORAL_TABLET | Freq: Two times a day (BID) | ORAL | Status: DC
  Filled 2014-06-18 (×4): qty 1

## 2014-06-18 NOTE — ED Provider Notes (Signed)
CSN: [id number]     Arrival date & time 06/18/14  1818 History   First MD Initiated Contact with Patient 06/18/14 1844     Chief Complaint  Patient presents with  . Manic Behavior     (Consider location/radiation/quality/duration/timing/severity/associated sxs/prior Treatment) HPI Comments: [name] is a 39 y.o. female with a PMHx of bipolar affective disorder, OCD, and anxiety, who presents to the ED accompanied by her brother who brought her in for "manic behaviors". He reports that for the last few days she has been calling him constantly and her speech is more accelerated which occurs when she goes off of her medications. Upon questioning of the patient, she denies ever taking Abilify, but admits to taking Klonopin at night and occasionally takes trazodone, last taken 5 days ago. She denies any SI/HI/AVH and denies that she has any issues today. She denies any harmful behaviors, no suicide attempts, and the brother denies any suicide threats. She states that her therapist name is Waynetta SandyBeth but cannot recall her last name. She smokes cigarettes daily, uses marijuana frequently, and drinks alcohol socially, last consuming alcohol on Friday night. She denies any IV drug use. She denies any medical complaints at this time including fever, chills, chest pain, shortness breath, abdominal pain, nausea, vomiting, diarrhea, dysuria, hematuria, vaginal bleeding or discharge, numbness, tingling, weakness, rashes, arthralgias, or myalgias. She states that she is being persecuted, and someone is out to get her.   Her speech is rapid and pressured, and she uses profanity when speaking. Her brother states that she's been hospitalized in the past for mania. She is voluntarily here today but her brother states that if she tries to leave, he will get IVC paperwork on her because he thinks she is a threat to herself.  Patient is a 39 y.o. female presenting with mental health disorder. The history is provided by  the patient and a relative. No language interpreter was used.  Mental Health Problem Presenting symptoms: agitation (manic) and paranoid behavior (states she feels persecuted)   Presenting symptoms: no hallucinations, no self mutilation, no suicidal thoughts, no suicidal threats and no suicide attempt   Patient accompanied by:  Family member Onset quality:  Unable to specify Timing:  Constant Progression:  Unchanged Chronicity:  Recurrent Context: noncompliance   Treatment compliance:  None of the time Time since last dose of psychoactive medication: klonopin last night, trazodone 5 days ago, abilify "never" Relieved by:  None tried Worsened by:  Nothing tried Ineffective treatments:  None tried Associated symptoms: no abdominal pain and no chest pain   Risk factors: hx of mental illness and recent psychiatric admission     Past Medical History  Diagnosis Date  . Bipolar affective   . OCD (obsessive compulsive disorder)   . Anxiety    History reviewed. No pertinent past surgical history. Family History  Problem Relation Age of Onset  . Mental illness Neg Hx    History  Substance Use Topics  . Smoking status: Current Every Day Smoker -- 0.25 packs/day    Types: Cigarettes  . Smokeless tobacco: Not on file  . Alcohol Use: Yes   OB History    No data available     Review of Systems  Constitutional: Negative for fever and chills.  Respiratory: Negative for shortness of breath.   Cardiovascular: Negative for chest pain.  Gastrointestinal: Negative for nausea, vomiting, abdominal pain, diarrhea and constipation.  Genitourinary: Negative for dysuria, hematuria, vaginal bleeding and vaginal discharge.  Musculoskeletal:  Negative for myalgias and arthralgias.  Skin: Negative for color change.  Allergic/Immunologic: Negative for immunocompromised state.  Neurological: Negative for weakness and numbness.  Psychiatric/Behavioral: Positive for paranoia (states she feels  persecuted) and agitation (manic). Negative for suicidal ideas, hallucinations, confusion and self-injury.       +manic behavior   10 Systems reviewed and are negative for acute change except as noted in the HPI.    Allergies  Depakote  Home Medications   Prior to Admission medications   Medication Sig Start Date End Date Taking? Authorizing Provider  carbamazepine (TEGRETOL) 200 MG tablet Take 1 tablet (200 mg total) by mouth 2 (two) times daily after a meal. 03/21/14   Earney Navy, NP  chlorproMAZINE (THORAZINE) 100 MG tablet Take 1 tablet (100 mg total) by mouth 4 (four) times daily as needed (psychosis). 03/21/14   Earney Navy, NP  clonazePAM (KLONOPIN) 1 MG tablet Take 1 mg by mouth at bedtime. 01/06/14   Historical Provider, MD  paliperidone (INVEGA) 6 MG 24 hr tablet Take 1 tablet (6 mg total) by mouth daily. 03/21/14   Earney Navy, NP  traZODone (DESYREL) 50 MG tablet Take 50 mg by mouth at bedtime. 01/06/14   Historical Provider, MD   BP 140/104 mmHg  Pulse 127  Temp(Src) 98.2 F (36.8 C) (Oral)  Resp 20  SpO2 100% Physical Exam  Constitutional: She is oriented to person, place, and time. Vital signs are normal. She appears well-developed and well-nourished.  Non-toxic appearance. No distress.  Afebrile, nontoxic, NAD  HENT:  Head: Normocephalic and atraumatic.  Mouth/Throat: Oropharynx is clear and moist and mucous membranes are normal.  Eyes: Conjunctivae and EOM are normal. Right eye exhibits no discharge. Left eye exhibits no discharge.  Neck: Normal range of motion. Neck supple.  Cardiovascular: Regular rhythm, normal heart sounds and intact distal pulses.  Tachycardia present.  Exam reveals no gallop and no friction rub.   No murmur heard. Tachycardic, reg rhythm, nl s1/s2, no m/r/g, distal pulses intact  Pulmonary/Chest: Effort normal and breath sounds normal. No respiratory distress. She has no decreased breath sounds. She has no wheezes. She has  no rhonchi. She has no rales.  Abdominal: Soft. Normal appearance and bowel sounds are normal. She exhibits no distension. There is no tenderness. There is no rigidity, no rebound, no guarding, no CVA tenderness, no tenderness at McBurney's point and negative Murphy's sign.  Musculoskeletal: Normal range of motion.  Neurological: She is alert and oriented to person, place, and time. She has normal strength. No sensory deficit.  Skin: Skin is warm, dry and intact. No rash noted.  Psychiatric: Her affect is angry. Her speech is rapid and/or pressured. She is aggressive (verbally). Thought content is paranoid. She expresses no homicidal and no suicidal ideation. She expresses no suicidal plans and no homicidal plans.  Rapid pressured speech, somewhat angry and verbally aggressive, denies SI/HI/AVH, appears manic. Some paranoid thoughts of persecution.  Nursing note and vitals reviewed.   ED Course  Procedures (including critical care time) Labs Review Labs Reviewed  ACETAMINOPHEN LEVEL - Abnormal; Notable for the following:    Acetaminophen (Tylenol), Serum <10.0 (*)    All other components within normal limits  CBC - Abnormal; Notable for the following:    MCV 77.6 (*)    RDW 16.8 (*)    Platelets 429 (*)    All other components within normal limits  COMPREHENSIVE METABOLIC PANEL - Abnormal; Notable for the following:  Glucose, Bld 110 (*)    Creatinine, Ser 1.30 (*)    Total Protein 10.1 (*)    GFR calc non Af Amer 52 (*)    GFR calc Af Amer 60 (*)    All other components within normal limits  ETHANOL  SALICYLATE LEVEL  URINE RAPID DRUG SCREEN (HOSP PERFORMED)  POC URINE PREG, ED    Imaging Review No results found.   EKG Interpretation None      MDM   Final diagnoses:  Manic behavior  Bipolar disease, manic    39 y.o. female here with manic behavior, denies SI/HI/AVH, but cursing and rapid pressured speech. Feels persecuted, paranoid thoughts. Seems to be a threat  to herself. Voluntary at this time but would need IVC if she attempted to leave. Will get screening labs.  10:38 PM Tylenol level WNL. CBC with chronic elev plt count at 429. CMP with Cr 1.3, close to baseline. EtOH WNL. Salicylate level WNL. UDS and Upreg still not obtained, but pt is otherwise medically cleared. Has been evaluated by [facility] and dispo is still pending per their note. Please see their documentation of further dispo information. Holding orders in place and home meds ordered. Pt stable at this time.   France Ravens Camprubi-Soms, PA-C 06/18/14 2240  11:01 PM Beryle Flock of [facility] states that currently, with her being voluntary, she doesn't meet inpatient criteria but if she were IVC'd she would. Given that she appears acutely manic and the family feels she is a threat to herself if she leaves, will proceed with IVC paperwork and placement.  Albie Bazin Camprubi-Soms, PA-C 06/18/14 2302  Gerhard Munch, MD 06/20/14 [phone]

## 2014-06-18 NOTE — ED Notes (Signed)
Family at bedside.  Pt agrees to have labs drawn.

## 2014-06-18 NOTE — ED Notes (Signed)
Pt brought in by brother.  Brother and mom have concerns about behavior.  Pt appears to be manic.  Not taking her abilify.  Pt states she doesn't need those meds.  Agrees she is taking her klonopin and trazadone.  Pt is denying needing help. Family is saying that pt lives alone and are concerned for her not being functional off her meds. Pt denies SI/HI.  Pt states she has a Market researcherpsych doc and sees them. Pt is voluntary at this time.  Notified MD of behavior and concerns that pt may not stay to be seen.
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
## 2014-06-18 NOTE — ED Notes (Signed)
Patient is loud, rapid speech, defensive; argumentative. Denies SI, HI, AVH. Denies depression and anxiety. Reports that the only prescription that she takes is Klonopin. States she stopped Abilify because she does not like the side effects. Patient interacting with peers on the unit.  Encouragement offered. Snack provided. Refusing medications at present.  Q 15 safety checks in place.

## 2014-06-18 NOTE — ED Notes (Signed)
Pt's brother took all of pt's belongings with him; pt had no belongings left at the hospital

## 2014-06-18 NOTE — ED Notes (Signed)
Patient loud, argumentative with [employer] and security.  Restless, pacing at present.

## 2014-06-19 DIAGNOSIS — F3113 Bipolar disorder, current episode manic without psychotic features, severe: Secondary | ICD-10-CM

## 2014-06-19 DIAGNOSIS — F319 Bipolar disorder, unspecified: Secondary | ICD-10-CM | POA: Diagnosis not present

## 2014-06-19 DIAGNOSIS — F309 Manic episode, unspecified: Secondary | ICD-10-CM | POA: Diagnosis not present

## 2014-06-19 DIAGNOSIS — Z79899 Other long term (current) drug therapy: Secondary | ICD-10-CM | POA: Diagnosis not present

## 2014-06-19 DIAGNOSIS — Z888 Allergy status to other drugs, medicaments and biological substances status: Secondary | ICD-10-CM | POA: Diagnosis not present

## 2014-06-19 DIAGNOSIS — F4310 Post-traumatic stress disorder, unspecified: Secondary | ICD-10-CM | POA: Diagnosis present

## 2014-06-19 DIAGNOSIS — F17210 Nicotine dependence, cigarettes, uncomplicated: Secondary | ICD-10-CM | POA: Diagnosis present

## 2014-06-19 LAB — RAPID URINE DRUG SCREEN, HOSP PERFORMED
AMPHETAMINES: NOT DETECTED
BENZODIAZEPINES: POSITIVE — AB
Barbiturates: NOT DETECTED
COCAINE: NOT DETECTED
Opiates: NOT DETECTED
Tetrahydrocannabinol: NOT DETECTED

## 2014-06-19 MED ORDER — DIPHENHYDRAMINE HCL 50 MG/ML IJ SOLN
50.0000 mg | Freq: Once | INTRAMUSCULAR | Status: AC
  Administered 2014-06-19: 50 mg via INTRAMUSCULAR
  Filled 2014-06-19: qty 1

## 2014-06-19 MED ORDER — ZIPRASIDONE MESYLATE 20 MG IM SOLR
20.0000 mg | Freq: Once | INTRAMUSCULAR | Status: AC
  Administered 2014-06-19: 20 mg via INTRAMUSCULAR
  Filled 2014-06-19: qty 20

## 2014-06-19 MED ORDER — DIPHENHYDRAMINE HCL 25 MG PO CAPS
50.0000 mg | ORAL_CAPSULE | Freq: Once | ORAL | Status: AC
  Administered 2014-06-19: 50 mg via ORAL
  Filled 2014-06-19: qty 2

## 2014-06-19 MED ORDER — LORAZEPAM 2 MG/ML IJ SOLN
2.0000 mg | Freq: Four times a day (QID) | INTRAMUSCULAR | Status: DC
  Administered 2014-06-19: 2 mg via INTRAMUSCULAR
  Filled 2014-06-19: qty 1

## 2014-06-19 MED ORDER — DIPHENHYDRAMINE HCL 50 MG/ML IJ SOLN
INTRAMUSCULAR | Status: AC
  Filled 2014-06-19: qty 1

## 2014-06-19 MED ORDER — ZIPRASIDONE MESYLATE 20 MG IM SOLR
INTRAMUSCULAR | Status: AC
  Filled 2014-06-19: qty 20

## 2014-06-19 NOTE — ED Notes (Signed)
Patient remains loud with rapid speech and restlessness. Patient asked to stay on her side of the hall, starting to bang on unit doors and go to the other side of the unit.

## 2014-06-19 NOTE — Consult Note (Signed)
[facility] Consult   Reason for Consult:  Schizoaffective disorder, Bipolar type, manic Referring Physician:  EDP Patient Identification: [name] MRN:  [id number] Principal Diagnosis: Bipolar affective disorder, manic, severe Diagnosis:   Patient Active Problem List   Diagnosis Date Noted  . Bipolar affective disorder, manic, severe [F31.13]     Priority: High  . Bipolar disease, manic [F31.10]   . Bipolar disorder, current episode manic without psychotic features, moderate [F31.12]   . Manic behavior [F30.10] 06/22/2013  . Cannabis dependent [F12.10] 12/25/2012  . Schizoaffective disorder, unspecified condition [F25.9] 10/03/2012    Total Time spent with patient: 1 hour  Subjective:   [name] is a 39 y.o. female patient admitted with Manic Bipolar disorder.  HPI:  [name], 39 years old was evaluated for exacerbation of manic Bipolar disorder.   Patient was brought in by her brother under IVC for agitation and aggression.   Brother and mother stated that she has been calling them constantly and this behavior is peculiar with her when she does not take her medications.   Patient when seen this morning was agitated, aggressive and used profanity all through the short interaction with providers.  She did not want to engage in any meaningful conversation with " African providers"   She yelled out "get the f...ck out of my room.  All day she has been staying in the hallway in front of the nursing station asking for one thing or another.   She has been medicated with Geodon and Benadryl which put her to sleep for only 2 hours.  Patient have constantly yelled at staff and other patients.  She does not answer simple questions without  yelling or using profane language.  She denies SI/HI/AVH.  She has been accepted for admission and she has been accepted for admission at [facility].Marland Kitchen  HPI Elements:   Location:  Schizoaffective disorder, Bipolar manic. Quality:   severe. Severity:  severe. Timing:  Acute. Duration:  Chronic mental illness. Context:  Seeking treatment for manic symptoms..  Past Medical History:  Past Medical History  Diagnosis Date  . Bipolar affective   . OCD (obsessive compulsive disorder)   . Anxiety    History reviewed. No pertinent past surgical history. Family History:  Family History  Problem Relation Age of Onset  . Mental illness Neg Hx    Social History:  History  Alcohol Use  . Yes     History  Drug Use  . Yes  . Special: Marijuana    History   Social History  . Marital Status: Single    Spouse Name: N/A  . Number of Children: N/A  . Years of Education: N/A   Social History Main Topics  . Smoking status: Current Every Day Smoker -- 0.25 packs/day    Types: Cigarettes  . Smokeless tobacco: Not on file  . Alcohol Use: Yes  . Drug Use: Yes    Special: Marijuana  . Sexual Activity: No   Other Topics Concern  . None   Social History Narrative   Additional Social History:    Prescriptions: See PTA List History of alcohol / drug use?: Yes Longest period of sobriety (when/how long): unknown Name of Substance 1: Marijuana 1 - Age of First Use: 17 1 - Amount (size/oz): 1 blunt 1 - Frequency: weekends 1 - Duration: since 39 yo 1 - Last Use / Amount: 5 days ago Name of Substance 2: Alcohol 2 - Age of First Use: teens  2 - Amount (size/oz): mixed drinks until she is buzzed but stops before sick  2 - Frequency: weekends 2 - Duration: since teens 2 - Last Use / Amount: last Saturday Name of Substance 3: Nicotine 3 - Age of First Use: uknown 3 - Amount (size/oz): 1pk 3 - Frequency: daily if stressed: "I chain smoke if Im stressed." 3 - Duration: pt could not estimate time period 3 - Last Use / Amount: Today 06/18/14               Allergies:   Allergies  Allergen Reactions  . Depakote [Divalproex Sodium] Other (See Comments)    "knocked out"    Labs:  Results for orders placed or  performed during the hospital encounter of 06/18/14 (from the past 48 hour(s))  Acetaminophen level     Status: Abnormal   Collection Time: 06/18/14  6:57 PM  Result Value Ref Range   Acetaminophen (Tylenol), Serum <10.0 (L) 10 - 30 ug/mL    Comment:        THERAPEUTIC CONCENTRATIONS VARY SIGNIFICANTLY. A RANGE OF 10-30 ug/mL MAY BE AN EFFECTIVE CONCENTRATION FOR MANY PATIENTS. HOWEVER, SOME ARE BEST TREATED AT CONCENTRATIONS OUTSIDE THIS RANGE. ACETAMINOPHEN CONCENTRATIONS >150 ug/mL AT 4 HOURS AFTER INGESTION AND >50 ug/mL AT 12 HOURS AFTER INGESTION ARE OFTEN ASSOCIATED WITH TOXIC REACTIONS.   CBC     Status: Abnormal   Collection Time: 06/18/14  6:57 PM  Result Value Ref Range   WBC 9.3 4.0 - 10.5 K/uL   RBC 4.68 3.87 - 5.11 MIL/uL   Hemoglobin 12.2 12.0 - 15.0 g/dL   HCT 36.3 36.0 - 46.0 %   MCV 77.6 (L) 78.0 - 100.0 fL   MCH 26.1 26.0 - 34.0 pg   MCHC 33.6 30.0 - 36.0 g/dL   RDW 16.8 (H) 11.5 - 15.5 %   Platelets 429 (H) 150 - 400 K/uL  Comprehensive metabolic panel     Status: Abnormal   Collection Time: 06/18/14  6:57 PM  Result Value Ref Range   Sodium 137 135 - 145 mmol/L   Potassium 4.0 3.5 - 5.1 mmol/L   Chloride 105 96 - 112 mmol/L   CO2 23 19 - 32 mmol/L   Glucose, Bld 110 (H) 70 - 99 mg/dL   BUN 18 6 - 23 mg/dL   Creatinine, Ser 1.30 (H) 0.50 - 1.10 mg/dL   Calcium 9.4 8.4 - 10.5 mg/dL   Total Protein 10.1 (H) 6.0 - 8.3 g/dL   Albumin 5.1 3.5 - 5.2 g/dL   AST 31 0 - 37 U/L   ALT 20 0 - 35 U/L   Alkaline Phosphatase 66 39 - 117 U/L   Total Bilirubin 0.7 0.3 - 1.2 mg/dL   GFR calc non Af Amer 52 (L) >90 mL/min   GFR calc Af Amer 60 (L) >90 mL/min    Comment: (NOTE) The eGFR has been calculated using the CKD EPI equation. This calculation has not been validated in all clinical situations. eGFR's persistently <90 mL/min signify possible Chronic Kidney Disease.    Anion gap 9 5 - 15  Ethanol (ETOH)     Status: None   Collection Time: 06/18/14  6:57  PM  Result Value Ref Range   Alcohol, Ethyl (B) <5 0 - 9 mg/dL    Comment:        LOWEST DETECTABLE LIMIT FOR SERUM ALCOHOL IS 11 mg/dL FOR MEDICAL PURPOSES ONLY   Salicylate level     Status: None  Collection Time: 06/18/14  6:57 PM  Result Value Ref Range   Salicylate Lvl <1.9 2.8 - 20.0 mg/dL  Urine Drug Screen     Status: Abnormal   Collection Time: 06/19/14  9:44 AM  Result Value Ref Range   Opiates NONE DETECTED NONE DETECTED   Cocaine NONE DETECTED NONE DETECTED   Benzodiazepines POSITIVE (A) NONE DETECTED   Amphetamines NONE DETECTED NONE DETECTED   Tetrahydrocannabinol NONE DETECTED NONE DETECTED   Barbiturates NONE DETECTED NONE DETECTED    Comment:        DRUG SCREEN FOR MEDICAL PURPOSES ONLY.  IF CONFIRMATION IS NEEDED FOR ANY PURPOSE, NOTIFY LAB WITHIN 5 DAYS.        LOWEST DETECTABLE LIMITS FOR URINE DRUG SCREEN Drug Class       Cutoff (ng/mL) Amphetamine      1000 Barbiturate      200 Benzodiazepine   622 Tricyclics       297 Opiates          300 Cocaine          300 THC              50     Vitals: Blood pressure 157/97, pulse 125, temperature 98.2 F (36.8 C), temperature source Oral, resp. rate 21, SpO2 97 %.  Risk to Self: Suicidal Ideation: No Suicidal Intent: No Is patient at risk for suicide?: No Suicidal Plan?: No Access to Means: No (denies) What has been your use of drugs/alcohol within the last 12 months?: periodic How many times?:  ( ) Other Self Harm Risks:  (denies) Triggers for Past Attempts: Unpredictable Intentional Self Injurious Behavior: None (denies) Risk to Others: Homicidal Ideation: No (denies) Thoughts of Harm to Others: No (denies) Current Homicidal Intent: No Current Homicidal Plan: No Access to Homicidal Means: No Identified Victim: a History of harm to others?: No (denies) Assessment of Violence: None Noted Violent Behavior Description: na Does patient have access to weapons?: No Criminal Charges Pending?: No  (denies) Does patient have a court date: No (denies) Prior Inpatient Therapy: Prior Inpatient Therapy: Yes Prior Therapy Dates: multiple Prior Therapy Facilty/Provider(s): [facility], [place] Reason for Treatment: Bipolar I Prior Outpatient Therapy: Prior Outpatient Therapy: Yes Prior Therapy Dates: multiple Prior Therapy Facilty/Provider(s): [place] Reason for Treatment: Bipolar  Current Facility-Administered Medications  Medication Dose Route Frequency Provider Last Rate Last Dose  . acetaminophen (TYLENOL) tablet 650 mg  650 mg Oral Q4H PRN Mercedes Camprubi-Soms, PA-C      . alum & mag hydroxide-simeth (MAALOX/MYLANTA) 200-200-20 MG/5ML suspension 30 mL  30 mL Oral PRN Mercedes Camprubi-Soms, PA-C      . carbamazepine (TEGRETOL) tablet 200 mg  200 mg Oral BID PC Mercedes Camprubi-Soms, PA-C   200 mg at 06/18/14 2014  . chlorproMAZINE (THORAZINE) tablet 100 mg  100 mg Oral QID PRN Mercedes Camprubi-Soms, PA-C   100 mg at 06/19/14 0918  . clonazePAM (KLONOPIN) tablet 1 mg  1 mg Oral QHS Mercedes Camprubi-Soms, PA-C   1 mg at 06/18/14 2059  . diphenhydrAMINE (BENADRYL) 50 MG/ML injection           . ibuprofen (ADVIL,MOTRIN) tablet 600 mg  600 mg Oral Q8H PRN Mercedes Camprubi-Soms, PA-C      . LORazepam (ATIVAN) injection 2 mg  2 mg Intramuscular Q6H Delfin Gant, NP   2 mg at 06/19/14 1308  . nicotine (NICODERM CQ - dosed in mg/24 hours) patch 21 mg  21 mg Transdermal Daily Mercedes  Camprubi-Soms, PA-C   21 mg at 06/18/14 2014  . ondansetron (ZOFRAN) tablet 4 mg  4 mg Oral Q8H PRN Mercedes Camprubi-Soms, PA-C      . paliperidone (INVEGA) 24 hr tablet 6 mg  6 mg Oral Daily Mercedes Camprubi-Soms, PA-C   6 mg at 06/19/14 0918  . traZODone (DESYREL) tablet 50 mg  50 mg Oral QHS Mercedes Camprubi-Soms, PA-C   50 mg at 06/19/14 0973  . ziprasidone (GEODON) 20 MG injection            Current Outpatient Prescriptions  Medication Sig Dispense Refill  . clonazePAM (KLONOPIN) 1 MG  tablet Take 1 mg by mouth at bedtime.  0  . traZODone (DESYREL) 50 MG tablet Take 50 mg by mouth at bedtime.  0  . carbamazepine (TEGRETOL) 200 MG tablet Take 1 tablet (200 mg total) by mouth 2 (two) times daily after a meal. (Patient not taking: Reported on 06/18/2014) 30 tablet 0  . chlorproMAZINE (THORAZINE) 100 MG tablet Take 1 tablet (100 mg total) by mouth 4 (four) times daily as needed (psychosis). (Patient not taking: Reported on 06/18/2014) 60 tablet 0  . paliperidone (INVEGA) 6 MG 24 hr tablet Take 1 tablet (6 mg total) by mouth daily. (Patient not taking: Reported on 06/18/2014) 15 tablet 0    Musculoskeletal: Strength & Muscle Tone: within normal limits Gait & Station: normal Patient leans: N/A  Psychiatric Specialty Exam:     Blood pressure 157/97, pulse 125, temperature 98.2 F (36.8 C), temperature source Oral, resp. rate 21, SpO2 97 %.There is no weight on file to calculate BMI.  General Appearance: Casual  Eye Contact::  Good  Speech:  Pressured  Volume:  Increased  Mood:  Angry, Anxious, Euphoric and Irritable  Affect:  Congruent and Labile  Thought Process:  Disorganized, Loose and Tangential  Orientation:  Full (Time, Place, and Person)  Thought Content:  WDL  Suicidal Thoughts:  No  Homicidal Thoughts:  No  Memory:  Immediate;   Good Recent;   Fair Remote;   Fair  Judgement:  Impaired  Insight:  Shallow  Psychomotor Activity:  Increased and Restlessness  Concentration:  Fair  Recall:  NA  Fund of Knowledge:Poor  Language: Fair  Akathisia:  NA  Handed:  Right  AIMS (if indicated):     Assets:  Desire for Improvement  ADL's:  Intact  Cognition: Impaired,  Severe  Sleep:      Medical Decision Making: Established Problem, Worsening (2), Review of Medication Regimen & Side Effects (2) and Review of New Medication or Change in Dosage (2)  Treatment Plan Summary: Daily contact with patient to assess and evaluate symptoms and progress in treatment,  Medication management and Plan accepted for admission  Plan:  Recommend psychiatric Inpatient admission when medically cleared. Disposition:   Accepted at [facility] and will be transported as soon as transport is here.  Delfin Gant   PMHNP-BC 06/19/2014 3:01 PM Patient seen face-to-face for psychiatric evaluation, chart reviewed and case discussed with the physician extender and developed treatment plan. Reviewed the information documented and agree with the treatment plan. Corena Pilgrim, MD

## 2014-06-19 NOTE — ED Notes (Signed)
Patient loud,beligerent,and impossible to redirect.  Pacing the halls.  Threatening at times.  Security and [employer] at bedside. Acuity high.

## 2014-06-19 NOTE — ED Notes (Signed)
Patient is upset by IVC. Blames her mother.

## 2014-06-19 NOTE — ED Notes (Signed)
Patient now awake. Screaming,demanding,and cursing. Impossible to redirect.  Acuity high.

## 2014-06-19 NOTE — ED Notes (Signed)
Continues to be loud,beligerent,and impossible to redirect.

## 2014-06-19 NOTE — Progress Notes (Signed)
Per Darlene one high acuity bed available, [facility].  Pt referral sent to [facility] for review for waitlist.   Olga CoasterKristen Juliauna Stueve, LCSW  Clinical Social Work  [facility] [phone]

## 2014-06-19 NOTE — Progress Notes (Addendum)
[facility] by Dr. Bethann PunchesIkwechegh. Patient to be transported under IVC. CSW received call when bed avialable. Once bed available nurse can call report to [phone]. Bed expected to be available later today.   Olga CoasterKristen Reed, LCSW  Clinical Social Work  [facility] [phone]

## 2014-06-19 NOTE — ED Notes (Signed)
Refused d/c vital signs. 

## 2014-06-19 NOTE — Progress Notes (Signed)
CSW sought placement at the following facilities:  Faxed referral to: [facility]- per Broadus JohnWarren, no beds today but send referral for review in case of d/cs [facility]- per Kathlene NovemberMike, fax referral for waitlist review [facility]- Per Wylene MenLacey, no beds as of yet, fax [facility] in case of d/cs [facility] per Coral CeoEvelyn Davis  At [facility]: [facility] per Darlene  [facility]ic[facility]: [facility]  Ilean SkillMeghan Rielyn Krupinski, MSW, [facility]CSWA Clinical Social Work, Disposition  06/19/2014 [phone]

## 2014-06-26 DIAGNOSIS — F314 Bipolar disorder, current episode depressed, severe, without psychotic features: Secondary | ICD-10-CM | POA: Diagnosis not present

## 2014-07-03 DIAGNOSIS — F314 Bipolar disorder, current episode depressed, severe, without psychotic features: Secondary | ICD-10-CM | POA: Diagnosis not present

## 2014-07-10 DIAGNOSIS — F314 Bipolar disorder, current episode depressed, severe, without psychotic features: Secondary | ICD-10-CM | POA: Diagnosis not present

## 2014-07-31 DIAGNOSIS — F314 Bipolar disorder, current episode depressed, severe, without psychotic features: Secondary | ICD-10-CM | POA: Diagnosis not present

## 2014-08-11 DIAGNOSIS — F314 Bipolar disorder, current episode depressed, severe, without psychotic features: Secondary | ICD-10-CM | POA: Diagnosis not present

## 2014-08-17 ENCOUNTER — Emergency Department (HOSPITAL_COMMUNITY)
Admission: EM | Admit: 2014-08-17 | Discharge: 2014-08-20 | Disposition: A | Discharge status: Other Acute Inpatient Hospital | Payer: Medicare Other | Attending: Emergency Medicine | Admitting: Emergency Medicine

## 2014-08-17 ENCOUNTER — Encounter (HOSPITAL_COMMUNITY): Payer: Self-pay

## 2014-08-17 DIAGNOSIS — Z3202 Encounter for pregnancy test, result negative: Secondary | ICD-10-CM | POA: Insufficient documentation

## 2014-08-17 DIAGNOSIS — R45851 Suicidal ideations: Secondary | ICD-10-CM | POA: Diagnosis not present

## 2014-08-17 DIAGNOSIS — Z720 Tobacco use: Secondary | ICD-10-CM | POA: Diagnosis not present

## 2014-08-17 DIAGNOSIS — R45850 Homicidal ideations: Secondary | ICD-10-CM | POA: Diagnosis not present

## 2014-08-17 DIAGNOSIS — Z79899 Other long term (current) drug therapy: Secondary | ICD-10-CM | POA: Insufficient documentation

## 2014-08-17 DIAGNOSIS — E876 Hypokalemia: Secondary | ICD-10-CM | POA: Diagnosis not present

## 2014-08-17 DIAGNOSIS — F3113 Bipolar disorder, current episode manic without psychotic features, severe: Secondary | ICD-10-CM | POA: Diagnosis not present

## 2014-08-17 DIAGNOSIS — F309 Manic episode, unspecified: Secondary | ICD-10-CM | POA: Diagnosis not present

## 2014-08-17 DIAGNOSIS — Z008 Encounter for other general examination: Secondary | ICD-10-CM | POA: Diagnosis present

## 2014-08-17 LAB — CBC
HCT: 33.6 % — ABNORMAL LOW (ref 36.0–46.0)
Hemoglobin: 10.9 g/dL — ABNORMAL LOW (ref 12.0–15.0)
MCH: 26 pg (ref 26.0–34.0)
MCHC: 32.4 g/dL (ref 30.0–36.0)
MCV: 80 fL (ref 78.0–100.0)
PLATELETS: 397 10*3/uL (ref 150–400)
RBC: 4.2 MIL/uL (ref 3.87–5.11)
RDW: 16.2 % — AB (ref 11.5–15.5)
WBC: 10.5 10*3/uL (ref 4.0–10.5)

## 2014-08-17 LAB — COMPREHENSIVE METABOLIC PANEL
ALT: 17 U/L (ref 14–54)
AST: 23 U/L (ref 15–41)
Albumin: 4.6 g/dL (ref 3.5–5.0)
Alkaline Phosphatase: 56 U/L (ref 38–126)
Anion gap: 10 (ref 5–15)
BUN: 22 mg/dL — AB (ref 6–20)
CALCIUM: 9.2 mg/dL (ref 8.9–10.3)
CHLORIDE: 111 mmol/L (ref 101–111)
CO2: 20 mmol/L — ABNORMAL LOW (ref 22–32)
Creatinine, Ser: 1.41 mg/dL — ABNORMAL HIGH (ref 0.44–1.00)
GFR calc non Af Amer: 47 mL/min — ABNORMAL LOW (ref >60)
GFR, EST AFRICAN AMERICAN: 55 mL/min — AB (ref >60)
Glucose, Bld: 126 mg/dL — ABNORMAL HIGH (ref 65–99)
Potassium: 3.2 mmol/L — ABNORMAL LOW (ref 3.5–5.1)
Sodium: 141 mmol/L (ref 135–145)
TOTAL PROTEIN: 9.1 g/dL — AB (ref 6.5–8.1)
Total Bilirubin: 0.8 mg/dL (ref 0.3–1.2)

## 2014-08-17 LAB — ACETAMINOPHEN LEVEL: Acetaminophen (Tylenol), Serum: <10 ug/mL — ABNORMAL LOW (ref 10–30)

## 2014-08-17 LAB — SALICYLATE LEVEL

## 2014-08-17 LAB — ETHANOL: Alcohol, Ethyl (B): <5 mg/dL (ref <5)

## 2014-08-17 LAB — CARBAMAZEPINE LEVEL, TOTAL: Carbamazepine Lvl: <2 ug/mL — ABNORMAL LOW (ref 4.0–12.0)

## 2014-08-17 MED ORDER — LORAZEPAM 1 MG PO TABS
1.0000 mg | ORAL_TABLET | Freq: Once | ORAL | Status: AC
  Filled 2014-08-17: qty 1

## 2014-08-17 MED ORDER — MIDAZOLAM HCL 5 MG/ML IJ SOLN
5.0000 mg | Freq: Once | INTRAMUSCULAR | Status: AC
  Administered 2014-08-17: 5 mg via INTRAMUSCULAR
  Filled 2014-08-17: qty 5

## 2014-08-17 MED ORDER — TRAZODONE HCL 50 MG PO TABS: 50.0000 mg | ORAL_TABLET | Freq: Every day | ORAL | Status: DC

## 2014-08-17 MED ORDER — CHLORPROMAZINE HCL 25 MG/ML IJ SOLN
50.0000 mg | Freq: Once | INTRAMUSCULAR | Status: AC
  Administered 2014-08-17: 50 mg via INTRAMUSCULAR
  Filled 2014-08-17: qty 2

## 2014-08-17 MED ORDER — CARBAMAZEPINE 200 MG PO TABS
200.0000 mg | ORAL_TABLET | Freq: Two times a day (BID) | ORAL | Status: DC
  Administered 2014-08-17 – 2014-08-18 (×2): 200 mg via ORAL
  Filled 2014-08-17 (×5): qty 1

## 2014-08-17 MED ORDER — ZIPRASIDONE MESYLATE 20 MG IM SOLR
10.0000 mg | Freq: Once | INTRAMUSCULAR | Status: AC
  Administered 2014-08-17: 10 mg via INTRAMUSCULAR
  Filled 2014-08-17: qty 20

## 2014-08-17 MED ORDER — DIPHENHYDRAMINE HCL 50 MG/ML IJ SOLN
25.0000 mg | Freq: Four times a day (QID) | INTRAMUSCULAR | Status: DC | PRN
  Administered 2014-08-17 – 2014-08-18 (×3): 25 mg via INTRAMUSCULAR
  Filled 2014-08-17 (×4): qty 1

## 2014-08-17 MED ORDER — MIDAZOLAM HCL 5 MG/ML IJ SOLN
3.0000 mg | Freq: Once | INTRAMUSCULAR | Status: AC
  Administered 2014-08-17: 3 mg via INTRAMUSCULAR
  Filled 2014-08-17: qty 0.6
  Filled 2014-08-17: qty 1

## 2014-08-17 MED ORDER — PALIPERIDONE ER 6 MG PO TB24
6.0000 mg | ORAL_TABLET | Freq: Every day | ORAL | Status: DC
  Administered 2014-08-18 – 2014-08-20 (×3): 6 mg via ORAL
  Filled 2014-08-17 (×5): qty 1

## 2014-08-17 MED ORDER — NICOTINE 21 MG/24HR TD PT24
21.0000 mg | MEDICATED_PATCH | Freq: Every day | TRANSDERMAL | Status: DC
  Filled 2014-08-17: qty 1

## 2014-08-17 MED ORDER — CLONAZEPAM 1 MG PO TABS: 1.0000 mg | ORAL_TABLET | Freq: Every day | ORAL | Status: DC

## 2014-08-17 MED ORDER — LORAZEPAM 2 MG/ML IJ SOLN
1.0000 mg | Freq: Once | INTRAMUSCULAR | Status: AC
  Administered 2014-08-17: 1 mg via INTRAMUSCULAR
  Filled 2014-08-17: qty 1

## 2014-08-17 MED ORDER — DIPHENHYDRAMINE HCL 25 MG PO CAPS: 25.0000 mg | ORAL_CAPSULE | Freq: Four times a day (QID) | ORAL | Status: DC | PRN

## 2014-08-17 MED ORDER — CHLORPROMAZINE HCL 25 MG PO TABS
100.0000 mg | ORAL_TABLET | Freq: Four times a day (QID) | ORAL | Status: DC | PRN
  Filled 2014-08-17: qty 4

## 2014-08-17 MED ORDER — POTASSIUM CHLORIDE CRYS ER 20 MEQ PO TBCR
40.0000 meq | EXTENDED_RELEASE_TABLET | Freq: Once | ORAL | Status: DC
  Filled 2014-08-17: qty 2
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
## 2014-08-17 NOTE — ED Provider Notes (Signed)
CSN: [id number]     Arrival date & time 08/17/14  5701 History   First MD Initiated Contact with Patient 08/17/14 0933     Chief Complaint  Patient presents with  . Medical Clearance     (Consider location/radiation/quality/duration/timing/severity/associated sxs/prior Treatment) HPI Level V caveat psychiatric illness patient reports "I'm going to kill my mommy and I'm going to kill you" Patient reportedly noncompliant with medications. He also reported to the nurse that she was going to kill her roommate. No treatment prior to coming here Past Medical History  Diagnosis Date  . Bipolar affective   . OCD (obsessive compulsive disorder)   . Anxiety    History reviewed. No pertinent past surgical history. Family History  Problem Relation Age of Onset  . Mental illness Neg Hx    History  Substance Use Topics  . Smoking status: Current Every Day Smoker -- 1.00 packs/day    Types: Cigarettes  . Smokeless tobacco: Never Used  . Alcohol Use: Yes     Comment: socially   OB History    No data available     Review of Systems  Unable to perform ROS: Psychiatric disorder  Psychiatric/Behavioral: Positive for agitation. The patient is nervous/anxious.        Homicidal ideation      Allergies  Depakote  Home Medications   Prior to Admission medications   Medication Sig Start Date End Date Taking? Authorizing Provider  carbamazepine (TEGRETOL) 200 MG tablet Take 1 tablet (200 mg total) by mouth 2 (two) times daily after a meal. Patient not taking: Reported on 06/18/2014 03/21/14   Earney Navy, NP  chlorproMAZINE (THORAZINE) 100 MG tablet Take 1 tablet (100 mg total) by mouth 4 (four) times daily as needed (psychosis). Patient not taking: Reported on 06/18/2014 03/21/14   Earney Navy, NP  clonazePAM (KLONOPIN) 1 MG tablet Take 1 mg by mouth at bedtime. 01/06/14   Historical Provider, MD  paliperidone (INVEGA) 6 MG 24 hr tablet Take 1 tablet (6 mg total) by mouth  daily. Patient not taking: Reported on 06/18/2014 03/21/14   Earney Navy, NP  traZODone (DESYREL) 50 MG tablet Take 50 mg by mouth at bedtime. 01/06/14   Historical Provider, MD   BP 128/81 mmHg  Pulse 112  Temp(Src) 99.5 F (37.5 C) (Oral)  Resp 18  Ht 5\' 3"  (1.6 m)  Wt 204 lb (92.534 kg)  BMI 36.15 kg/m2  SpO2 97%  LMP 08/10/2014 Physical Exam  Constitutional: She is oriented to person, place, and time. She appears well-developed and well-nourished.  HENT:  Head: Normocephalic and atraumatic.  Eyes: Conjunctivae are normal. Pupils are equal, round, and reactive to light.  Neck: Neck supple. No tracheal deviation present. No thyromegaly present.  Cardiovascular: Normal rate and regular rhythm.   No murmur heard. Pulmonary/Chest: Effort normal and breath sounds normal.  Abdominal: Soft. Bowel sounds are normal. She exhibits no distension. There is no tenderness.  Musculoskeletal: Normal range of motion. She exhibits no edema or tenderness.  Neurological: She is alert and oriented to person, place, and time. Coordination normal.  Gait normal  Skin: Skin is warm and dry. No rash noted.  Psychiatric:  Agitated wanting to leave the emergency department pressured speech.  Nursing note and vitals reviewed.   ED Course  Procedures (including critical care time) Labs Review Labs Reviewed  ACETAMINOPHEN LEVEL  CBC  COMPREHENSIVE METABOLIC PANEL  ETHANOL  SALICYLATE LEVEL  URINE RAPID DRUG SCREEN, HOSP PERFORMED  Imaging Review No results found.   EKG Interpretation   Date/Time:  Sunday August 17 2014 12:32:57 EDT Ventricular Rate:  106 PR Interval:  149 QRS Duration: 73 QT Interval:  342 QTC Calculation: 454 R Axis:   73 Text Interpretation:  Sinus tachycardia Nonspecific T abnormalities,  lateral leads Baseline wander in lead(s) V6 No old tracing to compare  Confirmed by Ethelda Chick  MD, Enrique Manganaro [phone]) on 08/17/2014 2:45:20 PM     Intramuscular Versed ordered for  agitation. At 10:05 AM patient began to throw objects at staff. 4-point restraints ordered as she was felt to be a harm to staff. Patient involuntarily committed by me for psychiatric evaluation. First exam form filled out by me  11:10 AM patient remains highly agitated in 4. restraints yelling obscenities threatening to kill staff. Additional IMVersed ordered  12:25 PM patient somewhat more calm after treatment with intramuscular Versed. Remains awake and alert   2:40 PM patient alert ambulatory and talkative. Agitated.  Results for orders placed or performed during the hospital encounter of 08/17/14  Acetaminophen level  Result Value Ref Range   Acetaminophen (Tylenol), Serum <10 (L) 10 - 30 ug/mL  CBC  Result Value Ref Range   WBC 10.5 4.0 - 10.5 K/uL   RBC 4.20 3.87 - 5.11 MIL/uL   Hemoglobin 10.9 (L) 12.0 - 15.0 g/dL   HCT 36.6 (L) 44.0 - 34.7 %   MCV 80.0 78.0 - 100.0 fL   MCH 26.0 26.0 - 34.0 pg   MCHC 32.4 30.0 - 36.0 g/dL   RDW 42.5 (H) 95.6 - 38.7 %   Platelets 397 150 - 400 K/uL  Comprehensive metabolic panel  Result Value Ref Range   Sodium 141 135 - 145 mmol/L   Potassium 3.2 (L) 3.5 - 5.1 mmol/L   Chloride 111 101 - 111 mmol/L   CO2 20 (L) 22 - 32 mmol/L   Glucose, Bld 126 (H) 65 - 99 mg/dL   BUN 22 (H) 6 - 20 mg/dL   Creatinine, Ser 5.64 (H) 0.44 - 1.00 mg/dL   Calcium 9.2 8.9 - 33.2 mg/dL   Total Protein 9.1 (H) 6.5 - 8.1 g/dL   Albumin 4.6 3.5 - 5.0 g/dL   AST 23 15 - 41 U/L   ALT 17 14 - 54 U/L   Alkaline Phosphatase 56 38 - 126 U/L   Total Bilirubin 0.8 0.3 - 1.2 mg/dL   GFR calc non Af Amer 47 (L) >60 mL/min   GFR calc Af Amer 55 (L) >60 mL/min   Anion gap 10 5 - 15  Ethanol (ETOH)  Result Value Ref Range   Alcohol, Ethyl (B) <5 <5 mg/dL  Salicylate level  Result Value Ref Range   Salicylate Lvl <4.0 2.8 - 30.0 mg/dL  Carbamazepine level, total  Result Value Ref Range   Carbamazepine Lvl <2.0 (L) 4.0 - 12.0 ug/mL   No results found.  MDM   Patient is stable  medically for psychiatric evaluation Dx #1 homicidal ideation #2 manic behaviuor #3 hypokalemia Final diagnoses:  None    CRITICAL CARE Performed by: Doug Sou Total critical care time: 45 minutes Critical care time was exclusive of separately billable procedures and treating other patients. Critical care was necessary to treat or prevent imminent or life-threatening deterioration. Critical care was time spent personally by me on the following activities: development of treatment plan with patient and/or surrogate as well as nursing, discussions with consultants, evaluation of patient's response to treatment, examination of patient,  obtaining history from patient or surrogate, ordering and performing treatments and interventions, ordering and review of laboratory studies, ordering and review of radiographic studies, pulse oximetry and re-evaluation of patient's condition.    Doug Sou, MD 08/17/14 1446

## 2014-08-17 NOTE — ED Notes (Signed)
Pt medicated w/ assist from security.  Pt remains loud and difficult to redirect, cursing.

## 2014-08-17 NOTE — ED Notes (Signed)
Pt refusing to take PO meds other that kloniopin and refusing to take the shots, up at desk crusing, talking loudly

## 2014-08-17 NOTE — ED Notes (Signed)
Pt. Noted sleeping in room. No complaints or concerns voiced. No distress or abnormal behavior noted. Will continue to monitor with security cameras. Q 15 minute rounds continue. 

## 2014-08-17 NOTE — ED Notes (Signed)
Pt. In front of nurses station yelling, getting in other patients personal space and swearing. Pt. Following other patients into rest room. Pt. Resisting redirection. Security and [employer] on scene.

## 2014-08-17 NOTE — ED Notes (Signed)
Report received from Janie Rambo RN. Pt. Alert and oriented in no distress denies SI, HI, AVH and pain.  Pt. Instructed to come to me with problems or concerns.Will continue to monitor for safety via security cameras and Q 15 minute checks. 

## 2014-08-17 NOTE — ED Notes (Signed)
Report given to [facility] in psych ed.

## 2014-08-17 NOTE — ED Notes (Signed)
Pt remedicated with Versed after initial dose did not affect patient. No change in behavior noted and patient remained hostile toward staff.

## 2014-08-17 NOTE — ED Notes (Signed)
Pt given IM Versed, refused all other meds.

## 2014-08-17 NOTE — Progress Notes (Signed)
Disposition CSW completed referral to [facility] for patient, Auth # [id number].  Patient is being considered at [facility].   [facility] Disposition CSW [phone]

## 2014-08-17 NOTE — ED Notes (Signed)
Pt remains in restraints, pt continues to be verbally threatening and aggressive. Pt uncooperative. Staff remains sitting with patient.
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
## 2014-08-17 NOTE — ED Notes (Signed)
Laying down
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
## 2014-08-17 NOTE — ED Notes (Signed)
sleeping
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
## 2014-08-17 NOTE — ED Notes (Signed)
Pt ambulatory from ED w/ security.  Pt cursing, rapid, loud pressured speech, demanding, difficult to redirect.  NP aware.  Security here to assist
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
## 2014-08-18 DIAGNOSIS — R45850 Homicidal ideations: Secondary | ICD-10-CM

## 2014-08-18 DIAGNOSIS — F3113 Bipolar disorder, current episode manic without psychotic features, severe: Secondary | ICD-10-CM

## 2014-08-18 LAB — PREGNANCY, URINE: Preg Test, Ur: NEGATIVE

## 2014-08-18 LAB — RAPID URINE DRUG SCREEN, HOSP PERFORMED
AMPHETAMINES: NOT DETECTED
Barbiturates: NOT DETECTED
Benzodiazepines: POSITIVE — AB
COCAINE: NOT DETECTED
OPIATES: NOT DETECTED
TETRAHYDROCANNABINOL: NOT DETECTED

## 2014-08-18 MED ORDER — CARBAMAZEPINE 200 MG PO TABS
400.0000 mg | ORAL_TABLET | Freq: Two times a day (BID) | ORAL | Status: DC
  Administered 2014-08-18 – 2014-08-20 (×4): 400 mg via ORAL
  Filled 2014-08-18 (×6): qty 2

## 2014-08-18 MED ORDER — CLONAZEPAM 0.5 MG PO TABS
0.5000 mg | ORAL_TABLET | Freq: Two times a day (BID) | ORAL | Status: DC
  Administered 2014-08-18 – 2014-08-19 (×2): 0.5 mg via ORAL
  Filled 2014-08-18 (×2): qty 1

## 2014-08-18 MED ORDER — TRAZODONE HCL 100 MG PO TABS
100.0000 mg | ORAL_TABLET | Freq: Every day | ORAL | Status: DC
  Administered 2014-08-18 – 2014-08-19 (×2): 100 mg via ORAL
  Filled 2014-08-18 (×2): qty 1

## 2014-08-18 MED ORDER — ZIPRASIDONE MESYLATE 20 MG IM SOLR
20.0000 mg | Freq: Once | INTRAMUSCULAR | Status: AC
  Administered 2014-08-18: 20 mg via INTRAMUSCULAR

## 2014-08-18 MED ORDER — HALOPERIDOL LACTATE 5 MG/ML IJ SOLN
5.0000 mg | Freq: Four times a day (QID) | INTRAMUSCULAR | Status: DC | PRN
  Administered 2014-08-18: 5 mg via INTRAMUSCULAR
  Filled 2014-08-18: qty 1

## 2014-08-18 MED ORDER — DIPHENHYDRAMINE HCL 50 MG/ML IJ SOLN
50.0000 mg | Freq: Once | INTRAMUSCULAR | Status: AC
  Administered 2014-08-18: 50 mg via INTRAMUSCULAR

## 2014-08-18 MED ORDER — LORAZEPAM 2 MG/ML IJ SOLN
1.0000 mg | INTRAMUSCULAR | Status: DC | PRN
  Administered 2014-08-18: 1 mg via INTRAMUSCULAR
  Filled 2014-08-18: qty 1

## 2014-08-18 MED ORDER — DIPHENHYDRAMINE HCL 25 MG PO CAPS
50.0000 mg | ORAL_CAPSULE | Freq: Once | ORAL | Status: AC
  Administered 2014-08-18: 50 mg via ORAL
  Filled 2014-08-18: qty 2

## 2014-08-18 MED ORDER — LORAZEPAM 2 MG/ML IJ SOLN
2.0000 mg | Freq: Once | INTRAMUSCULAR | Status: AC
  Administered 2014-08-18: 2 mg via INTRAMUSCULAR

## 2014-08-18 MED ORDER — OLANZAPINE 10 MG PO TBDP
10.0000 mg | ORAL_TABLET | Freq: Three times a day (TID) | ORAL | Status: DC | PRN
Start: 2014-08-18 — End: 2014-08-19
  Administered 2014-08-19: 10 mg via ORAL
  Filled 2014-08-18: qty 1

## 2014-08-18 NOTE — Progress Notes (Signed)
Face-to-face assessment conducted and pt warrants medical non-violent due to pt instability with ambulation. Pt has been warned by nursing staff multiple times and is non-compliant with warnings intended to prevent her from falling and injuring herself. Pt informed about possibility of restraints if she continues to ambulate and stumble around against medical advice. Therefore, restraints necessary for high fall risk. Will continue to monitor and evaluate medical necessity.   Beau Fanny, [place] 08/18/2014

## 2014-08-18 NOTE — ED Notes (Signed)
Pt laying on stretcher quit/resting, not in restraints presently.

## 2014-08-18 NOTE — ED Notes (Signed)
Pt. Noted sleeping in room. No complaints or concerns voiced. No distress or abnormal behavior noted. Will continue to monitor with security cameras. Q 15 minute rounds continue. 

## 2014-08-18 NOTE — ED Notes (Signed)
Pt on stretcher, eyes closed, resting calmly, not in restraints presently.

## 2014-08-18 NOTE — ED Notes (Signed)
Patient medication due aggressive behavior.  Patient is unredirectable.  Security and police present.  Patient refused medication and was told that she would be held for administration.  Patient agreed to take medication without incident.
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
## 2014-08-18 NOTE — ED Notes (Signed)
Patient placed in soft wrist restraints for safety.  Patient has gotten up numerous times.  She is unsteady on her feet.  Patient almost fell twice.  She is running into the wall.  She continues to verbally threaten staff.

## 2014-08-18 NOTE — ED Notes (Signed)
Patient in day room lying down.
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
## 2014-08-18 NOTE — ED Notes (Signed)
Pt. Noted in room. No complaints or concerns voiced. No distress or abnormal behavior noted. Will continue to monitor with security cameras. Q 15 minute rounds continue. Sandwich and soft drink given. 

## 2014-08-18 NOTE — Progress Notes (Signed)
pcp is Urgent Care at [facility] [phone]

## 2014-08-18 NOTE — ED Notes (Signed)
Pt sleeping at present, no distress noted, resp even and unlabored.  Monitoring for safety, Q 15 in checks in effect.

## 2014-08-18 NOTE — ED Notes (Signed)
Offered pt water, pt refused.

## 2014-08-18 NOTE — ED Notes (Signed)
Pt up at nurses station yelling at staff, verbally abusive, demanding.

## 2014-08-18 NOTE — ED Notes (Signed)
Pt coming to front desk, verbally inappropriate and intrusive. Redirected back to room.

## 2014-08-18 NOTE — BH Assessment (Addendum)
[facility] Assessment Progress Note  The following facilities have been contacted to seek placement for this pt, with results as noted:  Beds available, information sent, decision pending:  [facility]  At capacity:  [facility]  Declined:  [facility] (no high acuity beds available on 6/13)   Maksymilian Mabey, MA Triage Specialist [phone]     

## 2014-08-19 DIAGNOSIS — R45850 Homicidal ideations: Secondary | ICD-10-CM | POA: Diagnosis not present

## 2014-08-19 DIAGNOSIS — R45851 Suicidal ideations: Secondary | ICD-10-CM | POA: Diagnosis not present

## 2014-08-19 DIAGNOSIS — F3113 Bipolar disorder, current episode manic without psychotic features, severe: Secondary | ICD-10-CM | POA: Diagnosis not present

## 2014-08-19 MED ORDER — DIPHENHYDRAMINE HCL 50 MG/ML IJ SOLN
50.0000 mg | Freq: Once | INTRAMUSCULAR | Status: AC
  Administered 2014-08-19: 50 mg via INTRAMUSCULAR
  Filled 2014-08-19: qty 1

## 2014-08-19 MED ORDER — WHITE PETROLATUM GEL
Status: DC | PRN
  Filled 2014-08-19: qty 5

## 2014-08-19 MED ORDER — LORAZEPAM 2 MG/ML IJ SOLN
2.0000 mg | Freq: Once | INTRAMUSCULAR | Status: AC
  Administered 2014-08-19: 2 mg via INTRAMUSCULAR
  Filled 2014-08-19: qty 1

## 2014-08-19 MED ORDER — OLANZAPINE 5 MG PO TBDP
5.0000 mg | ORAL_TABLET | Freq: Two times a day (BID) | ORAL | Status: DC
  Administered 2014-08-19 – 2014-08-20 (×2): 5 mg via ORAL
  Filled 2014-08-19 (×2): qty 1

## 2014-08-19 MED ORDER — WHITE PETROLATUM GEL
Status: DC | PRN
  Filled 2014-08-19: qty 28.35

## 2014-08-19 MED ORDER — LORAZEPAM 1 MG PO TABS
1.0000 mg | ORAL_TABLET | Freq: Two times a day (BID) | ORAL | Status: DC
Start: 2014-08-19 — End: 2014-08-20
  Administered 2014-08-19 – 2014-08-20 (×2): 1 mg via ORAL
  Filled 2014-08-19 (×2): qty 1

## 2014-08-19 MED ORDER — ZIPRASIDONE MESYLATE 20 MG IM SOLR
10.0000 mg | Freq: Once | INTRAMUSCULAR | Status: AC
  Administered 2014-08-19: 10 mg via INTRAMUSCULAR
  Filled 2014-08-19: qty 20

## 2014-08-19 MED ORDER — ZIPRASIDONE MESYLATE 20 MG IM SOLR
20.0000 mg | Freq: Once | INTRAMUSCULAR | Status: AC
  Administered 2014-08-19: 20 mg via INTRAMUSCULAR
  Filled 2014-08-19: qty 20
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
## 2014-08-19 NOTE — ED Notes (Signed)
Patient has constantly been up to nurse's station.  Patient ws medicated at 1130.  Patient will go to room and sleep for periods of time.  Patient will jump up and run out to nurse's station.  Patient is an extremely high fall risk.  Staff continues to assist patient back to room.  Patient has order for 1:1; she has to be observed from nurse's station due to acuity of unit.

## 2014-08-19 NOTE — Progress Notes (Signed)
Per [facility], pt referral was dismissed due to not receiving UA and UDS in timely manner however it was sent yesterday by tts.   CSW and TTS resending [facility] referral with priority notes.   Olga Coaster, LCSW  Clinical Social Work  [employer] [phone]

## 2014-08-19 NOTE — ED Notes (Signed)
Patient taken out of restraints.  Continues to need constant redirection.  Patient is unsteady on her feet due to medications.  Order for restraints has past.  Will consult with MD on continuing restraint order for safety of patient.  Patient is loud and uncooperative.  Will stand at nurse's station needing constant attention and being verbally abusive.

## 2014-08-19 NOTE — ED Notes (Signed)
Pt out of restraints, resting at present.

## 2014-08-19 NOTE — ED Notes (Addendum)
Unable to perform EKG due patient uncooperative.  She has been sleeping on/off due medication injections.  When she wakes, she attempts to run to nurse's station and has almost fallen several times.  Patient will not take any direction from staff at this time.
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
## 2014-08-19 NOTE — ED Notes (Signed)
Pt getting out of restraints, when contracting to stay in room and sleep. Pt repeatedly coming to desk.

## 2014-08-19 NOTE — ED Notes (Signed)
Pt sleeping at present, sonorus respirations, easy & unlabored. 1-1 sitter at bedside.  No distress noted, Monitoring for safety, Q 15 min checks in effect.

## 2014-08-19 NOTE — ED Notes (Signed)
Pt placed back in restraints.

## 2014-08-20 ENCOUNTER — Inpatient Hospital Stay (HOSPITAL_COMMUNITY)
Admission: AD | Admit: 2014-08-20 | Discharge: 2014-08-27 | DRG: 885 | Disposition: A | Discharge status: Home / Self Care | Payer: Medicare Other | Source: Intra-hospital | Attending: Psychiatry | Admitting: Psychiatry

## 2014-08-20 ENCOUNTER — Encounter (HOSPITAL_COMMUNITY): Payer: Self-pay | Admitting: Behavioral Health

## 2014-08-20 DIAGNOSIS — F3113 Bipolar disorder, current episode manic without psychotic features, severe: Secondary | ICD-10-CM | POA: Diagnosis not present

## 2014-08-20 DIAGNOSIS — Z3202 Encounter for pregnancy test, result negative: Secondary | ICD-10-CM | POA: Diagnosis not present

## 2014-08-20 DIAGNOSIS — Z79899 Other long term (current) drug therapy: Secondary | ICD-10-CM | POA: Diagnosis not present

## 2014-08-20 DIAGNOSIS — F17210 Nicotine dependence, cigarettes, uncomplicated: Secondary | ICD-10-CM | POA: Diagnosis present

## 2014-08-20 DIAGNOSIS — R45850 Homicidal ideations: Secondary | ICD-10-CM | POA: Diagnosis not present

## 2014-08-20 DIAGNOSIS — Z9119 Patient's noncompliance with other medical treatment and regimen: Secondary | ICD-10-CM | POA: Diagnosis present

## 2014-08-20 DIAGNOSIS — F17200 Nicotine dependence, unspecified, uncomplicated: Secondary | ICD-10-CM | POA: Diagnosis present

## 2014-08-20 DIAGNOSIS — G4700 Insomnia, unspecified: Secondary | ICD-10-CM | POA: Diagnosis present

## 2014-08-20 DIAGNOSIS — F1220 Cannabis dependence, uncomplicated: Secondary | ICD-10-CM | POA: Diagnosis present

## 2014-08-20 DIAGNOSIS — F319 Bipolar disorder, unspecified: Secondary | ICD-10-CM | POA: Diagnosis present

## 2014-08-20 DIAGNOSIS — Z720 Tobacco use: Secondary | ICD-10-CM | POA: Diagnosis not present

## 2014-08-20 DIAGNOSIS — F29 Unspecified psychosis not due to a substance or known physiological condition: Secondary | ICD-10-CM | POA: Diagnosis present

## 2014-08-20 MED ORDER — TRAZODONE HCL 100 MG PO TABS
100.0000 mg | ORAL_TABLET | Freq: Every day | ORAL | Status: DC
  Administered 2014-08-21: 100 mg via ORAL
  Filled 2014-08-20 (×3): qty 1

## 2014-08-20 MED ORDER — WHITE PETROLATUM GEL: Status: DC | PRN

## 2014-08-20 MED ORDER — ALUM & MAG HYDROXIDE-SIMETH 200-200-20 MG/5ML PO SUSP: 30.0000 mL | ORAL | Status: DC | PRN

## 2014-08-20 MED ORDER — ACETAMINOPHEN 325 MG PO TABS
650.0000 mg | ORAL_TABLET | Freq: Four times a day (QID) | ORAL | Status: DC | PRN
  Administered 2014-08-26: 650 mg via ORAL
  Filled 2014-08-20: qty 2

## 2014-08-20 MED ORDER — BENZTROPINE MESYLATE 0.5 MG PO TABS
0.5000 mg | ORAL_TABLET | Freq: Every day | ORAL | Status: DC
  Filled 2014-08-20 (×3): qty 1

## 2014-08-20 MED ORDER — PALIPERIDONE ER 6 MG PO TB24
6.0000 mg | ORAL_TABLET | Freq: Every day | ORAL | Status: DC
  Filled 2014-08-20 (×2): qty 1

## 2014-08-20 MED ORDER — MAGNESIUM HYDROXIDE 400 MG/5ML PO SUSP: 30.0000 mL | Freq: Every day | ORAL | Status: DC | PRN

## 2014-08-20 MED ORDER — LORAZEPAM 1 MG PO TABS: 2.0000 mg | ORAL_TABLET | Freq: Four times a day (QID) | ORAL | Status: DC | PRN

## 2014-08-20 MED ORDER — CARBAMAZEPINE 200 MG PO TABS
400.0000 mg | ORAL_TABLET | Freq: Two times a day (BID) | ORAL | Status: DC
  Administered 2014-08-21 – 2014-08-24 (×7): 400 mg via ORAL
  Filled 2014-08-20 (×14): qty 2

## 2014-08-20 MED ORDER — POTASSIUM CHLORIDE CRYS ER 20 MEQ PO TBCR
40.0000 meq | EXTENDED_RELEASE_TABLET | Freq: Once | ORAL | Status: AC
  Administered 2014-08-20: 40 meq via ORAL
  Filled 2014-08-20 (×2): qty 2

## 2014-08-20 MED ORDER — NICOTINE 21 MG/24HR TD PT24
21.0000 mg | MEDICATED_PATCH | Freq: Every day | TRANSDERMAL | Status: DC
  Filled 2014-08-20 (×5): qty 1

## 2014-08-20 MED ORDER — OLANZAPINE 5 MG PO TBDP
5.0000 mg | ORAL_TABLET | Freq: Two times a day (BID) | ORAL | Status: DC
  Filled 2014-08-20 (×4): qty 1

## 2014-08-20 NOTE — Progress Notes (Signed)
[facility] Lupita Leash) called requesting information on patient's presentation. Writer provided information per provider notes from today. Also requested pt's demographics including confirmation of address and contact numbers, both of which were provided.   Ilean Skill, MSW, LCSWA Clinical Social Work, Disposition  08/20/2014 [phone]

## 2014-08-20 NOTE — ED Notes (Signed)
Patient is currently sleeping, vitals will be assessed at a later time.

## 2014-08-20 NOTE — ED Notes (Addendum)
Pt remains a 1:1 . At times she appears unsteady on her feet. She does appear dishelved and contracts for safety. Presently pt is in her room with the mattress on the floor for safety. Her respirations are regular and nonlabored. 1:30p-Pt ate 100% of her lunch. She remains safe and remains a 1:1. Pt does contract for safety. Pt will be admitted across the street. Called report. 3p-Pt appears very groggy and remains safe. She requested a pitcher of grape juice and was given this.Pt is pleasant at this time. She remains cooperative. 3:10p-Report given to Paul Dykes made aware that pt can be very intrusive and loud without medication. Informed Kim that pt at times can be unsteady on her feet  should remain a 1:1 for safety for now. 3:20p-Pt instructed to push fluids due to her elevated BUN and Creat. Pts HR is also elevated. NP made aware. Pt denies feeling lightheaded and dizzy.

## 2014-08-20 NOTE — BH Assessment (Signed)
[facility] Assessment Progress Note  Per Thedore Mins, MD this pt continues to require psychiatric hospitalization.  Berneice Heinrich, RN, [facility] has assigned pt to [facility] Rm [id number].  Pt is under IVC, and paperwork has been faxed to [facility].  Pt's nurse, Marylu Lund, has been notified and agrees to call report to [phone].  Pt is to be transported via [employer].  Doylene Canning, MA Triage Specialist [phone]

## 2014-08-20 NOTE — ED Notes (Signed)
Pt AAO x 3, mother at bedside briefly. [employer] transport requested. Remains 1-1 sitter at bedside.  Monitoring for safety, Q 15 min checks in effect.
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
## 2014-08-20 NOTE — BH Assessment (Signed)
[facility] Assessment Progress Note  Per Alla German at [facility] at 09:37, pt is on their wait list.  Doylene Canning, MA Triage Specialist [phone]

## 2014-08-21 ENCOUNTER — Encounter (HOSPITAL_COMMUNITY): Payer: Self-pay | Admitting: Psychiatry

## 2014-08-21 DIAGNOSIS — F17200 Nicotine dependence, unspecified, uncomplicated: Secondary | ICD-10-CM | POA: Diagnosis present

## 2014-08-21 DIAGNOSIS — F3113 Bipolar disorder, current episode manic without psychotic features, severe: Secondary | ICD-10-CM | POA: Diagnosis present

## 2014-08-21 DIAGNOSIS — F1220 Cannabis dependence, uncomplicated: Secondary | ICD-10-CM

## 2014-08-21 DIAGNOSIS — Z720 Tobacco use: Secondary | ICD-10-CM

## 2014-08-21 MED ORDER — CLONAZEPAM 0.5 MG PO TABS
0.2500 mg | ORAL_TABLET | Freq: Two times a day (BID) | ORAL | Status: DC | PRN
  Administered 2014-08-21: 0.25 mg via ORAL
  Filled 2014-08-21: qty 1

## 2014-08-21 MED ORDER — PALIPERIDONE ER 6 MG PO TB24
6.0000 mg | ORAL_TABLET | Freq: Every day | ORAL | Status: DC
  Administered 2014-08-21 – 2014-08-26 (×6): 6 mg via ORAL
  Filled 2014-08-21 (×8): qty 1

## 2014-08-21 MED ORDER — HYDROXYZINE HCL 25 MG PO TABS
25.0000 mg | ORAL_TABLET | ORAL | Status: DC | PRN
  Filled 2014-08-21: qty 6

## 2014-08-21 MED ORDER — LORAZEPAM 1 MG PO TABS: 1.0000 mg | ORAL_TABLET | ORAL | Status: DC | PRN

## 2014-08-21 MED ORDER — ZIPRASIDONE MESYLATE 20 MG IM SOLR: 20.0000 mg | INTRAMUSCULAR | Status: DC | PRN

## 2014-08-21 MED ORDER — BENZTROPINE MESYLATE 0.5 MG PO TABS
0.5000 mg | ORAL_TABLET | Freq: Every day | ORAL | Status: DC
  Administered 2014-08-22 – 2014-08-26 (×5): 0.5 mg via ORAL
  Filled 2014-08-21 (×8): qty 1

## 2014-08-21 MED ORDER — LORAZEPAM 1 MG PO TABS: 1.0000 mg | ORAL_TABLET | Freq: Four times a day (QID) | ORAL | Status: DC | PRN

## 2014-08-21 MED ORDER — RISPERIDONE 2 MG PO TBDP: 2.0000 mg | ORAL_TABLET | Freq: Three times a day (TID) | ORAL | Status: DC | PRN
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
## 2014-08-21 NOTE — Progress Notes (Signed)
Patient 1:1 Note  D: Patient resting in bed with eyes closed.  Respirations even and unlabored.  Patient appears to be in no apparent distress. A: Staff to monitor Q 15 mins for safety.   R:Patient remains safe on the unit.  
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
## 2014-08-21 NOTE — Evaluation (Signed)
Physical Therapy Evaluation Patient Details Name: [name] MRN: [id number] DOB: 03/04/1976 Today's Date: 08/21/2014   History of Present Illness  nurse reports difficult walking  Clinical Impression  Pt was independent PLOF and staff reported pt was unsteady with gait. Pt has sitter and sitter stated (as well as pt) that her gait has improved throughout the day as she has gotten used to the medication. Pt ambulated in hallway , with very fast pace and has been doing this with the sitter as well. No LOB, but sitter still showing great precautions by using the gait belt until pt back to baseline. No skilled PT needs identified, for er baseline will resume soon.     Follow Up Recommendations No PT follow up    Equipment Recommendations  None recommended by PT    Recommendations for Other Services       Precautions / Restrictions        Mobility  Bed Mobility Overal bed mobility: Independent                Transfers Overall transfer level: Independent                  Ambulation/Gait Ambulation/Gait assistance: Supervision Ambulation Distance (Feet): 100 Feet Assistive device: None       General Gait Details: fast   Stairs            Wheelchair Mobility    Modified Rankin (Stroke Patients Only)       Balance                                             Pertinent Vitals/Pain      Home Living Family/patient expects to be discharged to:: Private residence Living Arrangements: Non-relatives/Friends                    Prior Function Level of Independence: Independent               Hand Dominance        Extremity/Trunk Assessment                         Communication      Cognition                            General Comments      Exercises        Assessment/Plan    PT Assessment Patent does not need any further PT services  PT Diagnosis Difficulty walking   PT  Problem List    PT Treatment Interventions     PT Goals (Current goals can be found in the Care Plan section) Acute Rehab PT Goals PT Goal Formulation: All assessment and education complete, DC therapy    Frequency     Barriers to discharge        Co-evaluation               End of Session Equipment Utilized During Treatment: Gait belt Activity Tolerance: Patient tolerated treatment well Patient left: with nursing/sitter in room Nurse Communication: Mobility status    Functional Assessment Tool Used: clinical judgement Functional Limitation: Mobility: Walking and moving around Mobility: Walking and Moving Around Current Status (H0388): 0 percent impaired, limited or restricted Mobility: Walking and Moving Around  Goal Status [phone]): 0 percent impaired, limited or restricted Mobility: Walking and Moving Around Discharge Status [phone]): 0 percent impaired, limited or restricted    Time: 1700-1708 PT Time Calculation (min) (ACUTE ONLY): 8 min   Charges:   PT Evaluation $Initial PT Evaluation Tier I: 1 Procedure     PT G Codes:   PT G-Codes **NOT FOR INPATIENT CLASS** Functional Assessment Tool Used: clinical judgement Functional Limitation: Mobility: Walking and moving around Mobility: Walking and Moving Around Current Status (U9811): 0 percent impaired, limited or restricted Mobility: Walking and Moving Around Goal Status (B1478): 0 percent impaired, limited or restricted Mobility: Walking and Moving Around Discharge Status [phone]): 0 percent impaired, limited or restricted    Sharronda Schweers 08/21/2014, 5:27 PM  Marella Bile, PT Pager: [phone] 08/21/2014

## 2014-08-21 NOTE — Progress Notes (Signed)
Patient 1:1 initiated at this time.  Initial note written in progress notes at bedside.  Patient is a fall risk and does not follow instructions.  Patient states she is ok but is unsteady and will not follow fall risk instructions.  Patient had to be brought on the unit in a wheelchair.  Patient has gait belt in the room.

## 2014-08-21 NOTE — [facility] Group Notes (Signed)
[facility] LCSW Group Therapy  08/21/2014 , 4:06 PM   Type of Therapy:  Group Therapy  Participation Level: Invited.  chose to not attend  Summary of Progress/Problems: Today's group focused on the term Diagnosis.  Participants were asked to define the term, and then pronounce whether it is a negative, positive or neutral term.  [name] 08/21/2014 , 4:06 PM

## 2014-08-21 NOTE — Tx Team (Addendum)
Initial Interdisciplinary Treatment Plan   PATIENT STRESSORS: Educational concerns Marital or family conflict Medication change or noncompliance Substance abuse   PATIENT STRENGTHS: Capable of independent living Wellsite geologist fund of knowledge Motivation for treatment/growth   PROBLEM LIST: Problem List/Patient Goals Date to be addressed Date deferred Reason deferred Estimated date of resolution  Psychosis 08/20/14     Suicide Risk 08/20/14     Anger 08/20/14     "I really don't know why I am here" 08/20/14     "They gave me too much medication" 08/20/14     Fall Risk 08/20/14                        DISCHARGE CRITERIA:  Ability to meet basic life and health needs Adequate post-discharge living arrangements Improved stabilization in mood, thinking, and/or behavior Safe-care adequate arrangements made  PRELIMINARY DISCHARGE PLAN: Attend aftercare/continuing care group Return to previous living arrangement  PATIENT/FAMIILY INVOLVEMENT: This treatment plan has been presented to and reviewed with the patient, FREDRICKA WATERFORD.  The patient and family have been given the opportunity to ask questions and make suggestions.  Angeline Slim M 08/21/2014, 2:17 AM
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
## 2014-08-21 NOTE — Progress Notes (Signed)
D:Pt's mood is irritable and labile. Pt continues to be unsteady and refused all morning medications stating that she takes only klonopin and trazodone. Pt is focused on leaving the hospital and talking to the MD. A:Offered support and 1:1 monitoring. Reported pt's refusal of medication and request to MD. R:Pt verbally denies si and continues to be irritable towards her mother but denies hi thoughts. Safety maintained on the unit.

## 2014-08-21 NOTE — BH Assessment (Deleted)
Patient refused labs this am.  Labs rescheduled for this evening.

## 2014-08-21 NOTE — Progress Notes (Signed)
D:Pt is in her bed resting. Respirations are even and unlabored.  A:Offered 1:1 observation. R:Safety maintained on the unit.

## 2014-08-21 NOTE — Progress Notes (Signed)
Called PT at [facility] and left phone number for return call to request an evaluation for this pt.

## 2014-08-21 NOTE — Progress Notes (Signed)
Pt requires frequent redirection and 1:1 monitoring for safety.

## 2014-08-21 NOTE — Tx Team (Signed)
Interdisciplinary Treatment Plan Update (Adult)  Date:  08/21/2014   Time Reviewed:  8:31 AM   Progress in Treatment: Attending groups: No Participating in groups:  No Taking medication as prescribed:  Yes. Tolerating medication:  Yes. Family/Significant othe contact made:  Yes Patient understands diagnosis: No  Limited insight Discussing patient identified problems/goals with staff:  Yes, see initial care plan. Medical problems stabilized or resolved:  Yes. Denies suicidal/homicidal ideation: Yes. Issues/concerns per patient self-inventory:  No. Other:  New problem(s) identified:  Discharge Plan or Barriers:  Return home, follow up outpt  Reason for Continuation of Hospitalization: Hallucinations Mania Medication stabilization  Comments:   Related encounter: ED from 08/17/2014 in [facility]      Patient was brought in by her mother. Patient's mother states she has not been taking her Abilify and is manic. Patient's mother states that the patient threatened to kill her roommate and her as well. Patient states that she has not been taking any of her meds except Klonipin. Patient states, "I am going to kill my mommy and my plan is to kill that Bitch." Patient then states that she has access to fire arms. Patient is verbally hostile. Patient has inappropriate laughter. Patient denies visual or auditory hallucinations. Patient denies SI and stated, "I love myself too much."    Tegretol, Invega trial with intention of giving injection prior to release  CSW to see if pt is willing to resume with ACT team, or be referred to a new one   Estimated length of stay: 4-5 days  New goal(s):  Review of initial/current patient goals per problem list:     Attendees: Patient:  08/21/2014 8:31 AM   Family:   08/21/2014 8:31 AM   Physician:  Jomarie Longs, MD 08/21/2014 8:31 AM   Nursing:   Marzetta Board, RN 08/21/2014 8:31 AM   CSW:    Daryel Gerald, LCSW    08/21/2014 8:31 AM   Other:  08/21/2014 8:31 AM   Other:   08/21/2014 8:31 AM   Other:  Onnie Boer, Nurse CM 08/21/2014 8:31 AM   Other:  Leisa Lenz, [facility] 08/21/2014 8:31 AM   Other:  Tomasita Morrow, P4CC  08/21/2014 8:31 AM   Other:  08/21/2014 8:31 AM   Other:  08/21/2014 8:31 AM   Other:  08/21/2014 8:31 AM   Other:  08/21/2014 8:31 AM   Other:  08/21/2014 8:31 AM   Other:   08/21/2014 8:31 AM    Scribe for Treatment Team:   Ida Rogue, 08/21/2014 8:31 AM

## 2014-08-21 NOTE — Progress Notes (Signed)
Talked with Jasmine December PT on the phone and she is coming to evaluate pt this afternoon.

## 2014-08-21 NOTE — Progress Notes (Signed)
Nursing Note 1:1 D: Pt. Is up walking with assistance and gait belt.  Pt. Agreed to take her scheduled medication (Tegretol). She was given a cup of gatorade to continue hydration. She is currently eating dinner. Pt. Attempted to call her mother.  A: Support/encouragement given.  R: 1:1 continues for safety. Continued use of Gait belt for pt safety. Pt is somewhat unstable in movement and continues to be a fall risk.

## 2014-08-21 NOTE — Progress Notes (Signed)
Patient ID: EZRI FASULO, female   DOB: 08/10/1975, 39 y.o.   MRN: [id number] PER STATE REGULATIONS 482.30  THIS CHART WAS REVIEWED FOR MEDICAL NECESSITY WITH RESPECT TO THE PATIENT'S ADMISSION/DURATION OF STAY.  NEXT REVIEW DATE: 08/24/14  Loura Halt, RN, BSN CASE MANAGER

## 2014-08-21 NOTE — Progress Notes (Signed)
D:Patient in the dayroom on first approach.  Patient states she had a good day.  Patient states she has slept some during the day.  Patient states she feels she is more steady on her feet than yesterday but admit she is still not completely steady and able to walk on her own without assistance.  Patient remains on 1:1 for fall risk and sitter is using the gait belt.  Patient did complain of anxiety.  Patient denies SI/HI and denies AVH.   A: Staff to monitor Q 15 mins for safety.  Encouragement and support offered.  Scheduled medications administered per orders.  Klonopin administered prn for anxiety. R: Patient remains safe on the unit.  Patient did not attend group tonight.  Patient visible on the unit at the beginning of the shift.  Patient taking administered medications
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
## 2014-08-21 NOTE — Progress Notes (Signed)
Pt was encouraged to attended evening karaoke group, however pt was asked to use a wheelchair down to the cafeteria due to her unsteady gate and pt refused to attend group. Pt stayed in her room and rested.

## 2014-08-21 NOTE — Progress Notes (Signed)
B[facility] Group Notes:  (Nursing/MHT/Case Management/Adjunct)  Date:  08/21/2014  Time:  1:20 PM  Type of Therapy:  Nurse Education  Participation Level:  Active  Participation Quality:  Inattentive  Affect:  Anxious  Cognitive:  Alert and Lacking  Insight:  Limited  Engagement in Group:  Off Topic  Modes of Intervention:  Discussion, Socialization and Support  Summary of Progress/Problems:Pt reports that her goal for today is to get out of the hospital. She then went on to say that she wanted to talk with the MD.  Delford Field Vivia Ewing 08/21/2014, 1:20 PM

## 2014-08-21 NOTE — Progress Notes (Signed)
Patient refused labs this am.  Labs rescheduled for this evening.   

## 2014-08-22 LAB — BASIC METABOLIC PANEL
Anion gap: 6 (ref 5–15)
BUN: 16 mg/dL (ref 6–20)
CALCIUM: 8.7 mg/dL — AB (ref 8.9–10.3)
CO2: 24 mmol/L (ref 22–32)
CREATININE: 1.02 mg/dL — AB (ref 0.44–1.00)
Chloride: 107 mmol/L (ref 101–111)
GFR calc non Af Amer: >60 mL/min (ref >60)
Glucose, Bld: 118 mg/dL — ABNORMAL HIGH (ref 65–99)
Potassium: 4 mmol/L (ref 3.5–5.1)
Sodium: 137 mmol/L (ref 135–145)

## 2014-08-22 LAB — MAGNESIUM: Magnesium: 2.2 mg/dL (ref 1.7–2.4)

## 2014-08-22 LAB — TSH: TSH: 0.495 u[IU]/mL (ref 0.350–4.500)

## 2014-08-22 MED ORDER — PALIPERIDONE PALMITATE 234 MG/1.5ML IM SUSP
234.0000 mg | Freq: Once | INTRAMUSCULAR | Status: AC
  Administered 2014-08-25: 234 mg via INTRAMUSCULAR
  Filled 2014-08-22: qty 1.5

## 2014-08-22 MED ORDER — TRAZODONE HCL 100 MG PO TABS: 100.0000 mg | ORAL_TABLET | Freq: Every evening | ORAL | Status: DC | PRN

## 2014-08-22 MED ORDER — PALIPERIDONE PALMITATE 156 MG/ML IM SUSP: 117.0000 mg | INTRAMUSCULAR | Status: DC

## 2014-08-22 MED ORDER — TRAZODONE HCL 50 MG PO TABS
50.0000 mg | ORAL_TABLET | Freq: Every evening | ORAL | Status: DC | PRN
  Filled 2014-08-22: qty 3

## 2014-08-22 MED ORDER — PALIPERIDONE PALMITATE 156 MG/ML IM SUSP: 156.0000 mg | Freq: Once | INTRAMUSCULAR | Status: DC

## 2014-08-22 NOTE — Progress Notes (Signed)
Pt observed in group therapy at this time. No complaints voiced in the last 4 hours. Continues to deny all symptoms, mood is irritable. 1:1 obs remains in place for safety. Patient is safe. Lawrence Marseilles

## 2014-08-22 NOTE — BHH Group Notes (Signed)
BHH LCSW Group Therapy  08/22/2014  1:05 PM  Type of Therapy:  Group therapy  Participation Level:  Active  Participation Quality:  Attentive  Affect:  Flat  Cognitive:  Oriented  Insight:  Limited  Engagement in Therapy:  Limited  Modes of Intervention:  Discussion, Socialization  Summary of Progress/Problems:  Chaplain was here to lead a group on themes of hope and courage. "Hope means things will get better, even when we are going through a hard time.  I don't want to talk about it anymore." Sat quietly through the rest of group. [name] 08/22/2014 1:43 PM

## 2014-08-22 NOTE — Plan of Care (Signed)
Problem: Ineffective individual coping Goal: STG: Patient will remain free from self harm Outcome: Progressing Patient has not engaged in self harm  Problem: Diagnosis: Increased Risk For Suicide Attempt Goal: STG-Patient Will Comply With Medication Regime Outcome: Progressing Patient has been med compliant.

## 2014-08-22 NOTE — Progress Notes (Signed)
Patient has continued to sleep almost all day. Did get up for afternoon group but fell asleep. Continues to deny complaints. She remains unsteady and requires 1:1 obs which is in place. Patient is safe. Lawrence Marseilles
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
## 2014-08-22 NOTE — BHH Group Notes (Signed)
[facility] LCSW Aftercare Discharge Planning Group Note   08/22/2014 10:33 AM  Participation Quality:  In bed, asleep, snoring.    [name] B

## 2014-08-22 NOTE — Progress Notes (Signed)
Patient observed resting in bed, audible snoring. RR WNL, even and unlabored. Level I obs remains in place for safety and patient is safe. Lawrence Marseilles

## 2014-08-22 NOTE — Progress Notes (Addendum)
1:1 Note D: Patient resting in bed with eyes closed.  Respirations even and unlabored.  Patient appears to be in no apparent distress. A: Staff to monitor Q 15 mins for safety.  Patient remains on 1:1 for fall risk safety. R:Patient remains safe on the unit.

## 2014-08-22 NOTE — Progress Notes (Signed)
Patient 1:1 note  D: Patient resting in bed with eyes closed.  Respirations even and unlabored.  Patient appears to be in no apparent distress. A: Staff to monitor Q 15 mins for safety.   R:Patient remains safe on the unit.

## 2014-08-22 NOTE — Progress Notes (Signed)
Patient 1:1 Note D: Patient resting in bed with eyes closed.  Respirations even and unlabored.  Patient appears to be in no apparent distress.   A: Staff to monitor Q 15 mins for safety.  Patient remains on 1:1 for fall risk safety. R:Patient remains safe on the unit.  

## 2014-08-22 NOTE — Progress Notes (Signed)
Psychoeducational Group Note  Date:  08/22/2014 Time:  2051  Group Topic/Focus:  Wrap-Up Group:   The focus of this group is to help patients review their daily goal of treatment and discuss progress on daily workbooks.  Participation Level: Did Not Attend  Participation Quality:  Not Applicable  Affect:  Not Applicable  Cognitive:  Not Applicable  Insight:  Not Applicable  Engagement in Group: Not Applicable  Additional Comments:  The patient did not attend group this evening since she was asleep in her bed.   Hazle Coca S 08/22/2014, 8:51 PM

## 2014-08-22 NOTE — Progress Notes (Signed)
Recreation Therapy Notes  06.17.16 @ 1430 am LRT went to meet with patient but patient was asleep.  LRT will follow up.  Caroll Rancher, LRT/CTRS    Reanna, Ahmadi A 08/22/2014 4:51 PM

## 2014-08-22 NOTE — Progress Notes (Signed)
D:Patient in bed on approach.  Patient states she had a good day.  Patient states her goal for today was to attend more groups.  Patient states she was able to attend one group.  Patient states he mother came to visit today.  Patient states it was a good visit.  Patient states she feels her gait is steady tonight per Clinical research associate observation patient is still unsteady and the gait belt is in use. but  Patient denies SI/HI and denies AVH. A: Staff to monitor Q 15 mins for safety.  Encouragement and support offered.  Scheduled medications administered per orders.   R: Patient remains safe on the unit.  Patient did not attend group tonight.  Patient taking administered medications.  Patient not visible on the unit and not interacting with peers.

## 2014-08-23 DIAGNOSIS — F3113 Bipolar disorder, current episode manic without psychotic features, severe: Principal | ICD-10-CM

## 2014-08-23 LAB — PROLACTIN: Prolactin: 150 ng/mL — ABNORMAL HIGH (ref 4.8–23.3)

## 2014-08-23 NOTE — Progress Notes (Signed)
Patient is resting in bed, light audible snoring. RR WNL, even and unlabored. During 0800 assess patient denied any complaints, pain. No AVH or SI/HI. 1:1 obs remains in place due to high fall risk. Patient safe. [name]

## 2014-08-23 NOTE — Plan of Care (Signed)
Problem: Alteration in thought process Goal: STG-Patient does not respond to command hallucinations Outcome: Progressing Patient denies AVH and does not appear to respond to internal stimuli.  Problem: Ineffective individual coping Goal: STG: Patient will remain free from self harm Outcome: Progressing Patient has not engaged in self harm and denies SI

## 2014-08-23 NOTE — Progress Notes (Signed)
D: Patient resting in bed with eyes closed.  Respirations even and unlabored.  Patient appears to be in no apparent distress. A: Staff to monitor Q 15 mins for safety.  Patient remains on 1:1 for fall risk safety R:Patient remains safe on the unit.  

## 2014-08-23 NOTE — Progress Notes (Signed)
[name], female   DOB: 20-Jan-1976, 39 y.o.   MRN: [id number] [facility] MD Progress Note  08/23/2014 5:21 PM [name]  MRN:  [id number] Subjective: Patient states "I had a manic episode. My brought me in. I got upset before and received some medication. I have been very sleepy since. I don't take the Tegretol because of the way it makes me feel. My mind is too slow. I guess I need to start or I will just keep ending up in the hospital."   Objective: Patient seen and chart reviewed.Discussed patient with treatment team. [name] is a 39 y.o. [name] , single , on SSD , has a past hx of Bipolar Disorder, presented to [facility] for an episode of mania. Patient per initial notes in EHR "stated that she called the police due to someone stealing her phone. Patient stated that she was "minding her business" when her mother knocked on the door and "lied" to her roommate and "forced" her to get into the car and come to the ED."Patient also reported that she was homicidal with a plan to shoot her mother and her room mate while in ED.'Pt was very agitated in the ED requiring prn medications as well as four point restraints. Pt seen this AM. Pt continues to be on 1:1 for fall risk as well as safety. Pt however has been calm since admission to [facility], unlike her behavior in the ED. Pt still in bed during assessment today and appears groggy. Patient remains a high risk for falls currently with need to continue 1:1. So far the patient is tolerating Invega with plan to start [place] on 08/25/14.   Principal Problem: Bipolar disorder, current episode manic w/o psychotic features, severe Diagnosis:   Patient Active Problem List   Diagnosis Date Noted  . Bipolar disorder, current episode manic w/o psychotic features, severe [F31.13] 08/21/2014  . Cannabis use disorder, moderate, dependence [F12.20] 08/21/2014  . Tobacco use disorder [Z72.0] 08/21/2014   Total Time spent with patient: 20  minutes  Past Medical History:  Past Medical History  Diagnosis Date  . Bipolar affective   . OCD (obsessive compulsive disorder)   . Anxiety    History reviewed. No pertinent past surgical history. Family History:  Family History  Problem Relation Age of Onset  . Drug abuse Father   . Mental illness Other    Social History:  History  Alcohol Use  . Yes    Comment: socially     History  Drug Use  . Yes  . Special: Marijuana    Comment: occasionally    History   Social History  . Marital Status: Single    Spouse Name: N/A  . Number of Children: N/A  . Years of Education: N/A   Social History Main Topics  . Smoking status: Current Every Day Smoker -- 1.00 packs/day    Types: Cigarettes  . Smokeless tobacco: Never Used  . Alcohol Use: Yes     Comment: socially  . Drug Use: Yes    Special: Marijuana     Comment: occasionally  . Sexual Activity: No   Other Topics Concern  . None   Social History Narrative   Additional History:    Sleep: Fair  Appetite:  Fair  Musculoskeletal: Strength & Muscle Tone: within normal limits Gait & Station: unsteady Patient leans: N/A  Psychiatric Specialty Exam: Physical Exam  Review of Systems  Constitutional: Positive for malaise/fatigue.  HENT: Negative.  Eyes: Negative.   Respiratory: Negative.   Cardiovascular: Negative.   Gastrointestinal: Negative.   Genitourinary: Negative.   Musculoskeletal: Negative.   Skin: Negative.   Neurological: Negative.   Endo/Heme/Allergies: Negative.   Psychiatric/Behavioral: Positive for depression. Negative for suicidal ideas, hallucinations, memory loss and substance abuse. The patient is nervous/anxious. The patient does not have insomnia.   All other systems reviewed and are negative.   Blood pressure 103/68, pulse 124, temperature 98.6 F (37 C), temperature source Oral, resp. rate 18, height $RemoveBe'5\' 3"'mdvPLMJqa$  (1.6 m), weight 88.905 kg (196 lb), last menstrual period 08/15/2014,  SpO2 100 %.Body mass index is 34.73 kg/(m^2).  General Appearance: Disheveled  Eye Sport and exercise psychologist::  Fair  Speech:  Slow  Volume:  Decreased  Mood:  Anxious and Irritable  Affect:  Congruent  Thought Process:  Linear  Orientation:  Full (Time, Place, and Person)  Thought Content:  Delusions and Paranoid Ideation  Suicidal Thoughts:  No  Homicidal Thoughts:  No  Memory:  Immediate;   Fair Recent;   Fair Remote;   Fair  Judgement:  Impaired  Insight:  Shallow  Psychomotor Activity:  Decreased  Concentration:  Poor  Recall:  [employer] of Knowledge:Fair  Language: Fair  Akathisia:  No  Handed:  Right  AIMS (if indicated):     Assets:  Physical Health Social Support  ADL's:  Intact  Cognition: WNL  Sleep:  Number of Hours: 6.5     Current Medications: Current Facility-Administered Medications  Medication Dose Route Frequency Provider Last Rate Last Dose  . acetaminophen (TYLENOL) tablet 650 mg  650 mg Oral Q6H PRN Benjamine Mola, FNP      . alum & mag hydroxide-simeth (MAALOX/MYLANTA) 200-200-20 MG/5ML suspension 30 mL  30 mL Oral Q4H PRN Benjamine Mola, FNP      . benztropine (COGENTIN) tablet 0.5 mg  0.5 mg Oral Q2000 Saramma Eappen, MD   0.5 mg at 08/22/14 2147  . carbamazepine (TEGRETOL) tablet 400 mg  400 mg Oral BID PC Benjamine Mola, FNP   400 mg at 08/23/14 1649  . clonazePAM (KLONOPIN) tablet 0.25 mg  0.25 mg Oral BID PRN Ursula Alert, MD   0.25 mg at 08/21/14 2220  . hydrOXYzine (ATARAX/VISTARIL) tablet 25 mg  25 mg Oral Q4H PRN Saramma Eappen, MD      . risperiDONE (RISPERDAL M-TABS) disintegrating tablet 2 mg  2 mg Oral Q8H PRN Ursula Alert, MD       And  . LORazepam (ATIVAN) tablet 1 mg  1 mg Oral PRN Ursula Alert, MD       And  . ziprasidone (GEODON) injection 20 mg  20 mg Intramuscular PRN Saramma Eappen, MD      . magnesium hydroxide (MILK OF MAGNESIA) suspension 30 mL  30 mL Oral Daily PRN Benjamine Mola, FNP      . [START ON 09/29/2015] paliperidone (INVEGA  SUSTENNA) injection 117 mg  117 mg Intramuscular Q28 days Ursula Alert, MD      . Derrill Memo ON 09/01/2014] paliperidone (INVEGA SUSTENNA) injection 156 mg  156 mg Intramuscular Once Ursula Alert, MD      . Derrill Memo ON 08/25/2014] paliperidone (INVEGA SUSTENNA) injection 234 mg  234 mg Intramuscular Once Saramma Eappen, MD      . paliperidone (INVEGA) 24 hr tablet 6 mg  6 mg Oral Q2000 Ursula Alert, MD   6 mg at 08/22/14 2147  . traZODone (DESYREL) tablet 50 mg  50 mg Oral QHS PRN  Ursula Alert, MD      . white petrolatum (VASELINE) gel   Topical PRN Benjamine Mola, FNP        Lab Results:  Results for orders placed or performed during the hospital encounter of 08/20/14 (from the past 48 hour(s))  Basic metabolic panel     Status: Abnormal   Collection Time: 08/22/14  6:45 AM  Result Value Ref Range   Sodium 137 135 - 145 mmol/L   Potassium 4.0 3.5 - 5.1 mmol/L   Chloride 107 101 - 111 mmol/L   CO2 24 22 - 32 mmol/L   Glucose, Bld 118 (H) 65 - 99 mg/dL   BUN 16 6 - 20 mg/dL   Creatinine, Ser 1.02 (H) 0.44 - 1.00 mg/dL   Calcium 8.7 (L) 8.9 - 10.3 mg/dL   GFR calc non Af Amer >60 >60 mL/min   GFR calc Af Amer >60 >60 mL/min    Comment: (NOTE) The eGFR has been calculated using the CKD EPI equation. This calculation has not been validated in all clinical situations. eGFR's persistently <60 mL/min signify possible Chronic Kidney Disease.    Anion gap 6 5 - 15    Comment: Performed at [facility]  Magnesium     Status: None   Collection Time: 08/22/14  6:45 AM  Result Value Ref Range   Magnesium 2.2 1.7 - 2.4 mg/dL    Comment: Performed at [facility]  TSH     Status: None   Collection Time: 08/22/14  6:45 AM  Result Value Ref Range   TSH 0.495 0.350 - 4.500 uIU/mL    Comment: Performed at [facility]  Prolactin     Status: Abnormal   Collection Time: 08/22/14  6:45 AM  Result Value Ref Range   Prolactin 150.0 (H) 4.8 -  23.3 ng/mL    Comment: (NOTE) Performed At: [facility] [place], [place] [id number] Lindon Romp MD ZM:[id number] Performed at [facility]     Physical Findings: AIMS: Facial and Oral Movements Muscles of Facial Expression: None, normal Lips and Perioral Area: None, normal Jaw: None, normal Tongue: None, normal,Extremity Movements Upper (arms, wrists, hands, fingers): None, normal Lower (legs, knees, ankles, toes): None, normal, Trunk Movements Neck, shoulders, hips: None, normal, Overall Severity Severity of abnormal movements (highest score from questions above): None, normal Incapacitation due to abnormal movements: None, normal Patient's awareness of abnormal movements (rate only patient's report): No Awareness, Dental Status Current problems with teeth and/or dentures?: No Does patient usually wear dentures?: No  CIWA:  CIWA-Ar Total: 0 COWS:     Assessment: Pt is a 47 y old AAF with hx of Bipolar do , presented with mood lability, HI towards her mother with intent to shoot with gun. Pt had been noncompliant on her medications. Pt today is irritable , guarded, drowsy. Will readjust her medications.    Treatment Plan Summary: Daily contact with patient to assess and evaluate symptoms and progress in treatment and Medication management  Will continue Invega 6 mg po - change timing to QHS - due to drowsiness. This will help with her mood sx. If patient tolerates the Invega PO , then [place] sustenna 234 mg IM on 08/25/14 , Invega Sustenna 156 mg on 09/01/14 and then [place] 117 mg q28 days after that. Will continue Tegretol 400 mg po bid. Tegretol level reviewed- subtherapeutic. Will get another level on 08/25/14..-For mood sx. Will make  Trazodone 50 mg po qhs prn for sleep- dose reduction due to drowsiness in the AM. Will continue Klonopin 0.25 mg po bid prn for severe anxiety sx- pt was on this at home at a higher dose - will  taper off. Will make available prn medications Risperdal M tab 2 mg PO, ativan 1 mg po /Geodon 20 mg IM Prn for severe agitation/anxiety. Will continue Cogentin 0.5 mg po qhs for EPS. Pt with fall risk - will monitor on unit- Keep 1:1 for fall/safety. Pt with HI on admission- currently deneis it. Pt with abnormal BMP, repeat labs shows Hypokalemia corrected - Creatinine down trending - could monitor. Pt evaluated by PT - SEE PT consult notes. Will continue to monitor vitals ,medication compliance and treatment side effects while patient is here.  Will monitor for medical issues as well as call consult as needed.  CSW will start working on disposition.  Patient to participate in therapeutic milieu .   Medical Decision Making:  Review of Psycho-Social Stressors (1), Review or order clinical lab tests (1), Review of Last Therapy Session (1), Review of Medication Regimen & Side Effects (2) and Review of New Medication or Change in Dosage (2)  DAVIS, LAURA, NP-C 08/23/2014, 5:21 PM Agree with NP Progress Note as above,  Neita Garnet ,MD

## 2014-08-23 NOTE — Progress Notes (Signed)
Patient resting in bed with eyes open. Denying complaints. Did ambulate down to med window at 1700 with gait belt and 1:1 sitter. Patient remain unsteady on feet and drowsy though per her report feels the drowsiness is improving. Medicated without any difficulty. Support offered. 1:1 sitter at side for safety and patient is safe. [name]   

## 2014-08-23 NOTE — Progress Notes (Signed)
Patient continues to rest quietly. Is sleepy from medications and states this is not uncommon for her when her meds are restarted. She did eat lunch in her room as ambulation remains difficult due to unsteadiness. Patient not aware of her risk for falls as she states, "I'm fine" when asked if she is feeling more physically stable. Fall risk precautions in place, 1:1 MHT at side. Patient remains safe. Lawrence Marseilles

## 2014-08-23 NOTE — [facility] Group Notes (Signed)
[facility] Group Notes: (Clinical Social Work)   08/23/2014      Type of Therapy:  Group Therapy   Participation Level:  Did Not Attend despite MHT prompting   Oluwadarasimi Redmon Grossman-Orr, LCSW 08/23/2014, 1:16 PM     

## 2014-08-23 NOTE — Progress Notes (Signed)
Psychoeducational Group Note  Date:  08/23/2014 Time:  2151  Group Topic/Focus:  Wrap-Up Group:   The focus of this group is to help patients review their daily goal of treatment and discuss progress on daily workbooks.  Participation Level: Did Not Attend  Participation Quality:  Not Applicable  Affect:  Not Applicable  Cognitive:  Not Applicable  Insight:  Not Applicable  Engagement in Group: Not Applicable  Additional Comments:  The patient did not attend group this evening.   Hazle Coca S 08/23/2014, 9:51 PM

## 2014-08-23 NOTE — Progress Notes (Signed)
[name], female   DOB: [birth year], 39 y.o.   MRN: [id number] 1:1 notes  08/23/2014 @ 2130 D: Patient took a shower after her nap. Pt reports she is doing well just could not explain why she is still dizzy. Pt denies SI/HI/AVH and pain. Pt mood and affect is appropriate.  Pleasant and cooperate with assessment. No sign of distress noted at this time A: 1:1 observation for safety. Snacks and medication brought to pt room and administered as prescribed. Support and encouragement provided. R: Patient is safe. Sitter at bedside using gait belt for ambulation. 1:1 continues

## 2014-08-23 NOTE — [facility] Group Notes (Signed)
[facility] Group Notes:  (Nursing/MHT/Case Management/Adjunct)  Date:  08/23/2014  Time:  0900  Type of Therapy:  Nurse Education  Participation Level:  Did Not Attend  Participation Quality:    Affect:    Cognitive:    Insight:    Engagement in Group:    Modes of Intervention:    Summary of Progress/Problems: Patient drowsy and elected to stay in bed rather than attend group.   Merian Capron [facility] 08/23/2014, 3:44 PM

## 2014-08-23 NOTE — Progress Notes (Signed)
D: Patient resting in bed with eyes closed.  Respirations even and unlabored.  Patient appears to be in no apparent distress. A: Staff to monitor Q 15 mins for safety.  Patient remains on 1:1 for safety. R:  Patient remains safe on the unit.  

## 2014-08-24 NOTE — [facility] Group Notes (Signed)
[facility] Group Notes:  (Nursing/MHT/Case Management/Adjunct)  Date:  08/24/2014  Time:  1:08 PM  Type of Therapy:  Psychoeducational Skills  Participation Level:  Did Not Attend  Participation Quality:  Did Not Attend  Affect:  Did Not Attend  Cognitive:  Did Not Attend  Insight:  None  Engagement in Group:  Did Not Attend  Modes of Intervention:  Did Not Attend  Summary of Progress/Problems: Pt did not attend patient self inventory group.   [name] 08/24/2014, 1:08 PM

## 2014-08-24 NOTE — Progress Notes (Signed)
[name], female   DOB: 13-Dec-1975, 39 y.o.   MRN: [id number] [facility] MD Progress Note  08/24/2014 2:32 PM [name]  MRN:  [id number] Subjective: Patient states "I am doing better. I am much steadier on my feet. I realize now the importance of taking the mood stabilizer. Before I was just taking vistaril and trazodone. I need to break the cycle of coming back to the hospital over and over."   Objective: Patient seen and chart reviewed. [name] is a 39 y.o. [name] , single , on SSD , has a past hx of Bipolar Disorder, presented to [facility] for an episode of mania. Patient per initial notes in EHR "stated that she called the police due to someone stealing her phone. Patient stated that she was "minding her business" when her mother knocked on the door and "lied" to her roommate and "forced" her to get into the car and come to the ED." Pt continues to be on 1:1 for fall risk as well as safety. Pt however has been calm since admission to [facility], unlike her behavior in the ED.  Patient was deemed a high risk for falls the past few days. However, today she is observed ambulating on the unit with no difficulty. Her 1:1 observation has been discontinued. The patient appears to be showing more insight into reasons to remain compliant on medications.  So far the patient is tolerating Invega with plan to start Sustenna on 08/25/14.   Principal Problem: Bipolar disorder, current episode manic w/o psychotic features, severe Diagnosis:   Patient Active Problem List   Diagnosis Date Noted  . Bipolar disorder, current episode manic w/o psychotic features, severe [F31.13] 08/21/2014  . Cannabis use disorder, moderate, dependence [F12.20] 08/21/2014  . Tobacco use disorder [Z72.0] 08/21/2014   Total Time spent with patient: 20 minutes  Past Medical History:  Past Medical History  Diagnosis Date  . Bipolar affective   . OCD (obsessive compulsive disorder)   . Anxiety    History  reviewed. No pertinent past surgical history. Family History:  Family History  Problem Relation Age of Onset  . Drug abuse Father   . Mental illness Other    Social History:  History  Alcohol Use  . Yes    Comment: socially     History  Drug Use  . Yes  . Special: Marijuana    Comment: occasionally    History   Social History  . Marital Status: Single    Spouse Name: N/A  . Number of Children: N/A  . Years of Education: N/A   Social History Main Topics  . Smoking status: Current Every Day Smoker -- 1.00 packs/day    Types: Cigarettes  . Smokeless tobacco: Never Used  . Alcohol Use: Yes     Comment: socially  . Drug Use: Yes    Special: Marijuana     Comment: occasionally  . Sexual Activity: No   Other Topics Concern  . None   Social History Narrative   Additional History:    Sleep: Good  Appetite:  Fair  Musculoskeletal: Strength & Muscle Tone: within normal limits Gait & Station: normal Patient leans: N/A  Psychiatric Specialty Exam: Physical Exam  Review of Systems  Constitutional: Negative.   HENT: Negative.   Eyes: Negative.   Respiratory: Negative.   Cardiovascular: Negative.   Gastrointestinal: Negative.   Genitourinary: Negative.   Musculoskeletal: Negative.   Skin: Negative.   Neurological: Negative.  Endo/Heme/Allergies: Negative.   Psychiatric/Behavioral: Positive for depression. Negative for suicidal ideas, hallucinations, memory loss and substance abuse. The patient is nervous/anxious. The patient does not have insomnia.   All other systems reviewed and are negative.   Blood pressure 118/71, pulse 90, temperature 97.9 F (36.6 C), temperature source Oral, resp. rate 16, height  (1.6 m), weight 88.905 kg (196 lb), last menstrual period 08/15/2014, SpO2 100 %.Body mass index is 34.73 kg/(m^2).  General Appearance: Disheveled  Eye Solicitor::  Fair  Speech:  Slow  Volume:  Decreased  Mood:  Anxious and Irritable  Affect:   Congruent  Thought Process:  Linear  Orientation:  Full (Time, Place, and Person)  Thought Content:  Delusions and Paranoid Ideation  Suicidal Thoughts:  No  Homicidal Thoughts:  No  Memory:  Immediate;   Fair Recent;   Fair Remote;   Fair  Judgement:  Impaired  Insight:  Shallow  Psychomotor Activity:  Decreased  Concentration:  Poor  Recall:  [employer] of Knowledge:Fair  Language: Fair  Akathisia:  No  Handed:  Right  AIMS (if indicated):     Assets:  Physical Health Social Support  ADL's:  Intact  Cognition: WNL  Sleep:  Number of Hours: 6.75   Current Medications: Current Facility-Administered Medications  Medication Dose Route Frequency Provider Last Rate Last Dose  . acetaminophen (TYLENOL) tablet 650 mg  650 mg Oral Q6H PRN Beau Fanny, FNP      . alum & mag hydroxide-simeth (MAALOX/MYLANTA) 200-200-20 MG/5ML suspension 30 mL  30 mL Oral Q4H PRN Beau Fanny, FNP      . benztropine (COGENTIN) tablet 0.5 mg  0.5 mg Oral Q2000 Saramma Eappen, MD   0.5 mg at 08/23/14 2057  . carbamazepine (TEGRETOL) tablet 400 mg  400 mg Oral BID PC Beau Fanny, FNP   400 mg at 08/24/14 1050  . hydrOXYzine (ATARAX/VISTARIL) tablet 25 mg  25 mg Oral Q4H PRN Saramma Eappen, MD      . risperiDONE (RISPERDAL M-TABS) disintegrating tablet 2 mg  2 mg Oral Q8H PRN Jomarie Longs, MD       And  . LORazepam (ATIVAN) tablet 1 mg  1 mg Oral PRN Jomarie Longs, MD       And  . ziprasidone (GEODON) injection 20 mg  20 mg Intramuscular PRN Saramma Eappen, MD      . magnesium hydroxide (MILK OF MAGNESIA) suspension 30 mL  30 mL Oral Daily PRN Beau Fanny, FNP      . [START ON 09/29/2015] paliperidone (INVEGA SUSTENNA) injection 117 mg  117 mg Intramuscular Q28 days Jomarie Longs, MD      . Melene Muller ON 09/01/2014] paliperidone (INVEGA SUSTENNA) injection 156 mg  156 mg Intramuscular Once Jomarie Longs, MD      . Melene Muller ON 08/25/2014] paliperidone (INVEGA SUSTENNA) injection 234 mg  234 mg  Intramuscular Once Saramma Eappen, MD      . paliperidone (INVEGA) 24 hr tablet 6 mg  6 mg Oral Q2000 Jomarie Longs, MD   6 mg at 08/23/14 2057  . traZODone (DESYREL) tablet 50 mg  50 mg Oral QHS PRN Jomarie Longs, MD      . white petrolatum (VASELINE) gel   Topical PRN Beau Fanny, FNP        Lab Results:  No results found for this or any previous visit (from the past 48 hour(s)).  Physical Findings: AIMS: Facial and Oral Movements Muscles of Facial Expression:  None, normal Lips and Perioral Area: None, normal Jaw: None, normal Tongue: None, normal,Extremity Movements Upper (arms, wrists, hands, fingers): None, normal Lower (legs, knees, ankles, toes): None, normal, Trunk Movements Neck, shoulders, hips: None, normal, Overall Severity Severity of abnormal movements (highest score from questions above): None, normal Incapacitation due to abnormal movements: None, normal Patient's awareness of abnormal movements (rate only patient's report): No Awareness, Dental Status Current problems with teeth and/or dentures?: No Does patient usually wear dentures?: No  CIWA:  CIWA-Ar Total: 0 COWS:     Assessment: Pt is a 77 y old AAF with hx of Bipolar do , presented with mood lability, HI towards her mother with intent to shoot with gun. Pt had been noncompliant on her medications.   Treatment Plan Summary: Daily contact with patient to assess and evaluate symptoms and progress in treatment and Medication management  Will continue Invega 6 mg po - change timing to QHS - due to drowsiness. This will help with her mood sx. If patient tolerates the Invega PO , then [place] sustenna 234 mg IM on 08/25/14, Invega Sustenna 156 mg on 09/01/14 and then [place] 117 mg q28 days after that. Will continue Tegretol 400 mg po bid. Tegretol level reviewed- subtherapeutic. Will get another level on 08/25/14..-For mood sx. Will make Trazodone 50 mg po qhs prn for sleep- dose reduction due to drowsiness  in the AM. Will D/C Klonopin due to goal of taking patient off this medication.  Will make available prn medications Risperdal M tab 2 mg PO, ativan 1 mg po /Geodon 20 mg IM Prn for severe agitation/anxiety. Will continue Cogentin 0.5 mg po qhs for EPS. D/C 1:1 observation due to improvement in gait.  Pt with HI on admission- currently denies it. Will continue to monitor vitals, medication compliance and treatment side effects while patient is here.  Will monitor for medical issues as well as call consult as needed.  CSW will start working on disposition.  Patient to participate in therapeutic milieu .   Medical Decision Making:  Review of Psycho-Social Stressors (1), Review or order clinical lab tests (1), Review of Last Therapy Session (1), Review of Medication Regimen & Side Effects (2) and Review of New Medication or Change in Dosage (2)  DAVIS, LAURA, NP-C 08/24/2014, 2:32 PM Agree with NP Progress Note as above,  Nehemiah Massed ,MD

## 2014-08-24 NOTE — Progress Notes (Signed)
1:1 notes  08/24/2014 @ 0530  D: Patient in bed sleeping. Respiration regular and unlabored. No sign of distress noted at this time A: 1:1 observation for safety R: Patient s asleep. Sitter at bedside. 1:1 continues

## 2014-08-24 NOTE — [facility] Group Notes (Signed)
[facility] Group Notes: (Clinical Social Work)   08/24/2014      Type of Therapy:  Group Therapy   Participation Level:  Did Not Attend despite MHT prompting   Ambrose Mantle, LCSW 08/24/2014, 1:15 PM

## 2014-08-24 NOTE — Progress Notes (Signed)
1:1 notes  08/24/2014 @ 0130  D: Patient in bed sleeping. Respiration regular and unlabored. No sign of distress noted at this time A: 1:1 observation for safety R: Patient s asleep. Sitter at bedside. 1:1 continues

## 2014-08-24 NOTE — Progress Notes (Signed)
[facility] Post 1:1 Observation Documentation  For the first (8) hours following discontinuation of 1:1 precautions, a progress note entry by nursing staff should be documented at least every 2 hours, reflecting the patient's behavior, condition, mood, and conversation.  Use the progress notes for additional entries.  Time 1:1 discontinued:  11am   Patient's Behavior:  Pt in her room visiting with her visitors, no issues or concerns noted. Pt alert and oriented.   Patient's Condition:  Pt alert and oriented, no physical problems noted. Pt steady on her feet.  Patient's Conversation:  Pt reported that she felt great and that she was glad to be off the 1:1.   [name] 08/24/2014, 7:11 PM

## 2014-08-24 NOTE — Progress Notes (Signed)
[facility] Post 1:1 Observation Documentation  For the first (8) hours following discontinuation of 1:1 precautions, a progress note entry by nursing staff should be documented at least every 2 hours, reflecting the patient's behavior, condition, mood, and conversation.  Use the progress notes for additional entries.  Time 1:1 discontinued:  11am   Patient's Behavior:  Pt was on a 1:1 for high fall risk, she remains very steady on her feet. No safety issues noted.  Patient's Condition:  Pt in resting no physical distress noted.   Patient's Conversation:  Pt reports that she feels good, and that she was glad to be off the 1:1.  [name] 08/24/2014, 3:11 PM

## 2014-08-24 NOTE — Progress Notes (Signed)
[facility] Post 1:1 Observation Documentation  For the first (8) hours following discontinuation of 1:1 precautions, a progress note entry by nursing staff should be documented at least every 2 hours, reflecting the patient's behavior, condition, mood, and conversation.  Use the progress notes for additional entries.  Time 1:1 discontinued:  11am    Patient's Behavior:  Pt has been in the bed most of the day, patient has been appropriate she just reported that she was tired. Pt reported that she just felt like she had too much medication.  Patient's Condition:  Pt appropriate, just reported that she was extremely tired and just needed to rest.   Patient's Conversation:  Pt appropriate, just reported that she was extremely tired and just needed to rest.    Buford Dresser 08/24/2014, 5:48 PM

## 2014-08-24 NOTE — Progress Notes (Signed)
[facility] Post 1:1 Observation Documentation  For the first (8) hours following discontinuation of 1:1 precautions, a progress note entry by nursing staff should be documented at least every 2 hours, reflecting the patient's behavior, condition, mood, and conversation.  Use the progress notes for additional entries.  Time 1:1 discontinued:  11am   Patient's Behavior:  Pt was on a 1:1 for high fall risk, she remains very steady on her feet. No safety issues noted.  Patient's Condition:  Pt in resting no physical distress noted.   Patient's Conversation:  Pt reports that she feels good, and that she was glad to be off the 1:1.  [name] 08/24/2014, 3:06 PM

## 2014-08-25 LAB — CARBAMAZEPINE LEVEL, TOTAL: Carbamazepine Lvl: 14.4 ug/mL — ABNORMAL HIGH (ref 4.0–12.0)

## 2014-08-25 NOTE — Progress Notes (Signed)
The focus of this group is to help patients review their daily goal of treatment and discuss progress on daily workbooks. Pt did not attend the evening group. 

## 2014-08-25 NOTE — [facility] Group Notes (Signed)
[facility] LCSW Aftercare Discharge Planning Group Note   08/25/2014 10:47 AM  Participation Quality:  Invited  Chose to not attend    [name]

## 2014-08-25 NOTE — Progress Notes (Signed)
Recreation Therapy Notes  06.20.16 @ 9:53 am LRT met with patient about going over some coping skills.  Patient was lying down in bed.  Patient stated that she has a therapist she talks to but was open to going over coping skills.  LRT will follow up with patient this afternoon.  Victorino Sparrow, LRT/CTRS     06.20.16 @ 1315 pm LRT took patient a coping skills worksheet where she has to identify coping skills for 5 different categories.  Those categories were diversions, social, cognitive, tension release and physical.  Patient was lying in bed.  LRT went over the worksheet with the patient.  LRT will follow up with patient in the morning.  Victorino Sparrow, LRT/CTRS    [place], [place] A 08/25/2014 4:34 PM

## 2014-08-25 NOTE — Progress Notes (Signed)
[name], female   DOB: 16-Aug-1975, 39 y.o.   MRN: [id number] [facility] MD Progress Note  08/25/2014 3:29 PM [name]  MRN:  [id number] Subjective: Patient states "I am doing better."  Objective: Patient seen and chart reviewed. [name] is a 39 y.o. [name] , single , on SSD , has a past hx of Bipolar Disorder, presented to [facility] for an episode of mania. Patient per initial notes in EHR "stated that she called the police due to someone stealing her phone. Patient stated that she was "minding her business" when her mother knocked on the door and "lied" to her roommate and "forced" her to get into the car and come to the ED."   Pt with no disruptive issues noted on the unit - she remains withdrawn mostly , refusing to attend groups .  Pt denies any mood swings , sleep issues, she has been tolerating her medications well. Pt with tegretol level elevated - will hold tegretol - repeat labs - tegretol level. Pt discussed with about LAI - she agrees with plan - she will receive her first dose of Invega sustenna IM today. Pt also to discuss with CSW about ACTT.    Principal Problem: Bipolar disorder, current episode manic w/o psychotic features, severe Diagnosis:   Patient Active Problem List   Diagnosis Date Noted  . Bipolar disorder, current episode manic w/o psychotic features, severe [F31.13] 08/21/2014  . Cannabis use disorder, moderate, dependence [F12.20] 08/21/2014  . Tobacco use disorder [Z72.0] 08/21/2014   Total Time spent with patient: 30 minutes  Past Medical History:  Past Medical History  Diagnosis Date  . Bipolar affective   . OCD (obsessive compulsive disorder)   . Anxiety    History reviewed. No pertinent past surgical history. Family History:  Family History  Problem Relation Age of Onset  . Drug abuse Father   . Mental illness Other    Social History:  History  Alcohol Use  . Yes    Comment: socially     History  Drug Use  .  Yes  . Special: Marijuana    Comment: occasionally    History   Social History  . Marital Status: Single    Spouse Name: N/A  . Number of Children: N/A  . Years of Education: N/A   Social History Main Topics  . Smoking status: Current Every Day Smoker -- 1.00 packs/day    Types: Cigarettes  . Smokeless tobacco: Never Used  . Alcohol Use: Yes     Comment: socially  . Drug Use: Yes    Special: Marijuana     Comment: occasionally  . Sexual Activity: No   Other Topics Concern  . None   Social History Narrative   Additional History:    Sleep: Good  Appetite:  Fair  Musculoskeletal: Strength & Muscle Tone: within normal limits Gait & Station: normal Patient leans: N/A  Psychiatric Specialty Exam: Physical Exam  Review of Systems  Constitutional: Negative.   HENT: Negative.   Eyes: Negative.   Respiratory: Negative.   Cardiovascular: Negative.   Gastrointestinal: Negative.   Genitourinary: Negative.   Musculoskeletal: Negative.   Skin: Negative.   Neurological: Negative.   Endo/Heme/Allergies: Negative.   Psychiatric/Behavioral: Positive for depression. Negative for suicidal ideas, hallucinations, memory loss and substance abuse. The patient is nervous/anxious. The patient does not have insomnia.   All other systems reviewed and are negative.   Blood pressure 126/80, pulse 126, temperature 98  F (36.7 C), temperature source Oral, resp. rate 20, height  (1.6 m), weight 88.905 kg (196 lb), last menstrual period 08/15/2014, SpO2 100 %.Body mass index is 34.73 kg/(m^2).  General Appearance: Casual  Eye Contact::  Fair  Speech:  Slow  Volume:  Decreased  Mood:  Anxious  Affect:  Congruent  Thought Process:  Linear  Orientation:  Full (Time, Place, and Person)  Thought Content:  Rumination  Suicidal Thoughts:  No  Homicidal Thoughts:  No  Memory:  Immediate;   Fair Recent;   Fair Remote;   Fair  Judgement:  Fair  Insight:  Shallow  Psychomotor  Activity:  Decreased  Concentration:  Poor  Recall:  [employer] of Knowledge:Fair  Language: Fair  Akathisia:  No  Handed:  Right  AIMS (if indicated):     Assets:  Physical Health Social Support  ADL's:  Intact  Cognition: WNL  Sleep:  Number of Hours: 6.75   Current Medications: Current Facility-Administered Medications  Medication Dose Route Frequency Provider Last Rate Last Dose  . acetaminophen (TYLENOL) tablet 650 mg  650 mg Oral Q6H PRN Beau Fanny, FNP      . alum & mag hydroxide-simeth (MAALOX/MYLANTA) 200-200-20 MG/5ML suspension 30 mL  30 mL Oral Q4H PRN Beau Fanny, FNP      . benztropine (COGENTIN) tablet 0.5 mg  0.5 mg Oral Q2000 Aleila Syverson, MD   0.5 mg at 08/24/14 2011  . hydrOXYzine (ATARAX/VISTARIL) tablet 25 mg  25 mg Oral Q4H PRN Damain Broadus, MD      . risperiDONE (RISPERDAL M-TABS) disintegrating tablet 2 mg  2 mg Oral Q8H PRN Jomarie Longs, MD       And  . LORazepam (ATIVAN) tablet 1 mg  1 mg Oral PRN Jomarie Longs, MD       And  . ziprasidone (GEODON) injection 20 mg  20 mg Intramuscular PRN Garrell Flagg, MD      . magnesium hydroxide (MILK OF MAGNESIA) suspension 30 mL  30 mL Oral Daily PRN Beau Fanny, FNP      . [START ON 09/29/2015] paliperidone (INVEGA SUSTENNA) injection 117 mg  117 mg Intramuscular Q28 days Jomarie Longs, MD      . Melene Muller ON 09/01/2014] paliperidone (INVEGA SUSTENNA) injection 156 mg  156 mg Intramuscular Once Dee Paden, MD      . paliperidone (INVEGA) 24 hr tablet 6 mg  6 mg Oral Q2000 Jomarie Longs, MD   6 mg at 08/24/14 2011  . traZODone (DESYREL) tablet 50 mg  50 mg Oral QHS PRN Jomarie Longs, MD      . white petrolatum (VASELINE) gel   Topical PRN Beau Fanny, FNP        Lab Results:  Results for orders placed or performed during the hospital encounter of 08/20/14 (from the past 48 hour(s))  Carbamazepine level, total     Status: Abnormal   Collection Time: 08/25/14  6:19 AM  Result Value Ref Range    Carbamazepine Lvl 14.4 (H) 4.0 - 12.0 ug/mL    Comment: Performed at [facility]    Physical Findings: AIMS: Facial and Oral Movements Muscles of Facial Expression: None, normal Lips and Perioral Area: None, normal Jaw: None, normal Tongue: None, normal,Extremity Movements Upper (arms, wrists, hands, fingers): None, normal Lower (legs, knees, ankles, toes): None, normal, Trunk Movements Neck, shoulders, hips: None, normal, Overall Severity Severity of abnormal movements (highest score from questions above): None, normal Incapacitation due  to abnormal movements: None, normal Patient's awareness of abnormal movements (rate only patient's report): No Awareness, Dental Status Current problems with teeth and/or dentures?: No Does patient usually wear dentures?: No  CIWA:  CIWA-Ar Total: 0 COWS:     Assessment: Pt is a 50 y old AAF with hx of Bipolar do , presented with mood lability, HI towards her mother with intent to shoot with gun. Pt had been noncompliant on her medications.   Treatment Plan Summary: Daily contact with patient to assess and evaluate symptoms and progress in treatment and Medication management  Will continue Invega 6 mg po QHS. This will help with her mood sx.  Invega sustenna 234 mg IM on TODAY - 08/25/14, Invega Sustenna 156 mg on 09/01/14 and then [place] 117 mg q28 days after that. Will discontinue Tegretol - tegretol level elevated - repeat labs tomorrow  AM. Will make Trazodone 50 mg po qhs prn for sleep- dose reduction due to drowsiness in the AM. Will D/C Klonopin due to goal of taking patient off this medication.  Will make available prn medications Risperdal M tab 2 mg PO, ativan 1 mg po /Geodon 20 mg IM Prn for severe agitation/anxiety. Will continue Cogentin 0.5 mg po qhs for EPS. D/C 1:1 observation due to improvement in gait.  Pt with HI on admission- currently denies it. Will continue to monitor vitals, medication compliance and  treatment side effects while patient is here.  Will monitor for medical issues as well as call consult as needed.  CSW will start working on disposition.  Patient to participate in therapeutic milieu .   Medical Decision Making:  Review of Psycho-Social Stressors (1), Review or order clinical lab tests (1), Review of Last Therapy Session (1), Review of Medication Regimen & Side Effects (2) and Review of New Medication or Change in Dosage (2)  Cherry Turlington, md  08/25/2014, 3:29 PM

## 2014-08-25 NOTE — Progress Notes (Signed)
D: Patient spent the shift in her room. Came out once during medication time. Denies pain, SI, AH/VH. Patient stated "I'm fine". No new complaint.  A: Support offered. Patient encouraged to attend group. Due medications given as ordered. Every 15 minutes check for safety maintained. Will continue to monitor patient for safety and stability. R: Patient remains safe.

## 2014-08-25 NOTE — Progress Notes (Signed)
[name], female   DOB: 1975/04/25, 39 y.o.   MRN: [id number] PER STATE REGULATIONS 482.30  THIS CHART WAS REVIEWED FOR MEDICAL NECESSITY WITH RESPECT TO THE PATIENT'S ADMISSION/ DURATION OF STAY.  NEXT REVIEW DATE: 08/28/2014  Willa Rough, RN, BSN CASE MANAGER'

## 2014-08-25 NOTE — Progress Notes (Signed)
D:Patien tin her room on approach.  Patient states she had a good day.  Patient states she is glad that she is back on her medications.  Patient states she knows she made some bad choices before she came in but she states it was because she was off of her medications.  Patient states she has been in contact with her mother and states they had a good phone conversation.  Patient  denies SI/HI and denies AVH.   A: Staff to monitor Q 15 mins for safety.  Encouragement and support offered.  Scheduled medications administered per orders. R: Patient remains safe on the unit.  Patient did not attend group tonight.  Patient visible on the unit and interacting with peers.  Patient taking administered medications.

## 2014-08-25 NOTE — Progress Notes (Signed)
D:Pt has been pleasant today compared to last encounter with this Clinical research associate. Pt smiled upon approach and took medications with no reluctance. She rates depression as a 2 and anxiety as a 1 on 1-10 scale with 10 being the most. A:Offered support, encouragement and 15 minute checks. R:Pt denies si and hi. Safety maintained on the unit.

## 2014-08-25 NOTE — [facility] Group Notes (Signed)
[facility] LCSW Group Therapy  08/25/2014 1:15 pm  Type of Therapy: Process Group Therapy  Participation Level:  Active  Participation Quality:  Appropriate  Affect:  Flat  Cognitive:  Oriented  Insight:  Improving  Engagement in Group:  Limited  Engagement in Therapy:  Limited  Modes of Intervention:  Activity, Clarification, Education, Problem-solving and Support  Summary of Progress/Problems: Today's group addressed the issue of overcoming obstacles.  Patients were asked to identify their biggest obstacle post d/c that stands in the way of their on-going success, and then problem solve as to how to manage this.  Invited.  Chose to not attend.  "The Dr said i am going home on Wed."  Ida Rogue 08/25/2014   2:51 PM

## 2014-08-26 LAB — CARBAMAZEPINE LEVEL, TOTAL: Carbamazepine Lvl: 8.1 ug/mL (ref 4.0–12.0)

## 2014-08-26 MED ORDER — CARBAMAZEPINE ER 100 MG PO TB12
100.0000 mg | ORAL_TABLET | Freq: Two times a day (BID) | ORAL | Status: DC
  Administered 2014-08-27: 100 mg via ORAL
  Filled 2014-08-26 (×2): qty 1
  Filled 2014-08-26: qty 6
  Filled 2014-08-26: qty 1
  Filled 2014-08-26: qty 6
  Filled 2014-08-26: qty 1
  Filled 2014-08-26 (×2): qty 6
  Filled 2014-08-26: qty 1

## 2014-08-26 NOTE — [facility] Group Notes (Signed)
[facility] Group Notes:  (Nursing/Recovery)  Date:  08/26/2014  Time:  0930  Type of Therapy:  Psychoeducational Skills  Participation Level:  Did Not Attend  Summary of Progress/Problems: Pt did not attend scheduled group this AM despite multiple prompts.  Ouida Sills, Lincoln Maxin 08/26/2014, 0930

## 2014-08-26 NOTE — Plan of Care (Signed)
Problem: Alteration in thought process Goal: LTG-Patient has not harmed self or others in at least 2 days Outcome: Progressing Pt has no report of self injurious behavior X 2 days. No gestures or incident of self harm to note thus far this shift.

## 2014-08-26 NOTE — Progress Notes (Signed)
D: Pt observed in bed majority of this shift without behavioral issues. Pt denied SI, HI, AVH and pain when assessed this shift. Presents with flat affect and depressed mood, isolative / withdrawned to her room. Pt did not attend scheduled group as prompted this shift. OOB for meals and back to bed. Pt rated her depression 2/10, hopelessness 1/10 and anxiety 2/10. Pt's goal for today "leaving / aftercare" which she plans to accomplish by speaking with her doctor.  A: 1:1 contact made with pt to conduct shift assessment. Support and availability offered. Pt encouraged to voice concerns / needs. Safety maintained on Q 15 minutes checks as per order. Tegretol was re-ordered for this pt, first dose to start on 08/27/14 at 0800. R: Pt calm thus far. Safety maintained on / off unit. Pt voice no concerns to staff at this time. Pt in agreement with medication /treatment regimen ( Tegretol). Continue POC.

## 2014-08-26 NOTE — Progress Notes (Signed)
D:Paitent in bed on approach.  Patient states, "I am fine"  Patient has been in bed all shift and after encouragement still will not get out of bed.  Patient states she is to be discharged tomorrow and states she is just waiting for that time to come.  Patient has no complaints.  Patient states she is going to stay on her medications when she leaves.  Patient denies SI/HI and denies AVH.   A: Staff to monitor Q 15 mins for safety.  Encouragement and support offered.  Scheduled medications administered per orders.  Tylenol administered prn for headache. R: Patient remains safe on the unit.  Patient did not attend group tonight.  Patient not visible on the unit tonight.  Patient taking administered medications.
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
## 2014-08-26 NOTE — Progress Notes (Signed)
Recreation Therapy Notes  06.21.16 @ 1300 LRT met with patient about coping skills and stress management.  Patient completed coping skills worksheet.  Patient also expressed that she needs to use her deep breathing more because she knows what to do, she just doesn't do it all the time.  Patient stated that she is leaving tomorrow.  LRT encouraged patient to continue to use coping skills and deep breathing when she is feeling upset or frustrated.  Patient agreed and stated she "can't keep coming here".   Victorino Sparrow, LRT/CTRS    [place], Laurella Tull A 08/26/2014 1:25 PM

## 2014-08-27 MED ORDER — PALIPERIDONE PALMITATE 156 MG/ML IM SUSP: 117.0000 mg | INTRAMUSCULAR | Status: DC

## 2014-08-27 MED ORDER — BENZTROPINE MESYLATE 0.5 MG PO TABS: 0.5000 mg | ORAL_TABLET | Freq: Every day | ORAL | Status: DC

## 2014-08-27 MED ORDER — PALIPERIDONE PALMITATE 156 MG/ML IM SUSP: 156.0000 mg | Freq: Once | INTRAMUSCULAR | Status: DC

## 2014-08-27 MED ORDER — CARBAMAZEPINE ER 100 MG PO TB12: 100.0000 mg | ORAL_TABLET | Freq: Two times a day (BID) | ORAL | Status: DC

## 2014-08-27 MED ORDER — HYDROXYZINE HCL 25 MG PO TABS: 25.0000 mg | ORAL_TABLET | ORAL | Status: DC | PRN

## 2014-08-27 NOTE — Discharge Summary (Signed)
Physician Discharge Summary Note  Patient:  [name] is an 39 y.o., female MRN:  [id number] DOB:  Mar 09, 1975 Patient phone:  [phone] (home)  Patient address:   [address] [place] [address],  Total Time spent with patient: 45 minutes  Date of Admission:  08/20/2014 Date of Discharge: 08/27/2014  Reason for Admission:  Mania  Principal Problem: Bipolar disorder, current episode manic w/o psychotic features, severe Discharge Diagnoses: Patient Active Problem List   Diagnosis Date Noted  . Bipolar disorder, current episode manic w/o psychotic features, severe [F31.13] 08/21/2014  . Cannabis use disorder, moderate, dependence [F12.20] 08/21/2014  . Tobacco use disorder [Z72.0] 08/21/2014    Musculoskeletal: Strength & Muscle Tone: within normal limits Gait & Station: normal Patient leans: N/A  Psychiatric Specialty Exam:  SEE SRA Physical Exam  Vitals reviewed.   Review of Systems  All other systems reviewed and are negative.   Blood pressure 116/71, pulse 97, temperature 98.3 F (36.8 C), temperature source Oral, resp. rate 14, height  (1.6 m), weight 88.905 kg (196 lb), last menstrual period 08/15/2014, SpO2 100 %.Body mass index is 34.73 kg/(m^2).  Have you used any form of tobacco in the last 30 days? (Cigarettes, Smokeless Tobacco, Cigars, and/or Pipes): Yes  Has this patient used any form of tobacco in the last 30 days? (Cigarettes, Smokeless Tobacco, Cigars, and/or Pipes) N/A  Past Medical History:  Past Medical History  Diagnosis Date  . Bipolar affective   . OCD (obsessive compulsive disorder)   . Anxiety    History reviewed. No pertinent past surgical history. Family History:  Family History  Problem Relation Age of Onset  . Drug abuse Father   . Mental illness Other    Social History:  History  Alcohol Use  . Yes    Comment: socially     History  Drug Use  . Yes  . Special: Marijuana    Comment: occasionally     History   Social History  . Marital Status: Single    Spouse Name: N/A  . Number of Children: N/A  . Years of Education: N/A   Social History Main Topics  . Smoking status: Current Every Day Smoker -- 1.00 packs/day    Types: Cigarettes  . Smokeless tobacco: Never Used  . Alcohol Use: Yes     Comment: socially  . Drug Use: Yes    Special: Marijuana     Comment: occasionally  . Sexual Activity: No   Other Topics Concern  . None   Social History Narrative   Risk to Self: Is patient at risk for suicide?: Yes Risk to Others:   Prior Inpatient Therapy:   Prior Outpatient Therapy:    Level of Care:  OP  Hospital Course:  [name] was admitted for Bipolar disorder, current episode manic w/o psychotic features, severe and crisis management.  She was treated discharged with the medications listed below under Medication List.  Medical problems were identified and treated as needed.  Home medications were restarted as appropriate.  Improvement was monitored by observation and [name] daily report of symptom reduction.  Emotional and mental status was monitored by daily self-inventory reports completed by [name] and clinical staff.         [name] was evaluated by the treatment team for stability and plans for continued recovery upon discharge.  [name] motivation was an integral factor for scheduling further treatment.  Employment,  transportation, bed availability, health status, family support, and any pending legal issues were also considered during her hospital stay.  She was offered further treatment options upon discharge including but not limited to Residential, Intensive Outpatient, and Outpatient treatment.  [name] will follow up with the services as listed below under Follow Up Information.     Upon completion of this admission the patient was both mentally and medically stable for discharge denying suicidal/homicidal  ideation, auditory/visual/tactile hallucinations, delusional thoughts and paranoia.      Consults:  psychiatry  Significant Diagnostic Studies:  labs: per ED  Discharge Vitals:   Blood pressure 116/71, pulse 97, temperature 98.3 F (36.8 C), temperature source Oral, resp. rate 14, height 5\' 3"  (1.6 m), weight 88.905 kg (196 lb), last menstrual period 08/15/2014, SpO2 100 %. Body mass index is 34.73 kg/(m^2). Lab Results:   Results for orders placed or performed during the hospital encounter of 08/20/14 (from the past 72 hour(s))  Carbamazepine level, total     Status: Abnormal   Collection Time: 08/25/14  6:19 AM  Result Value Ref Range   Carbamazepine Lvl 14.4 (H) 4.0 - 12.0 ug/mL    Comment: Performed at [facility]  Carbamazepine level, total     Status: None   Collection Time: 08/26/14  6:25 AM  Result Value Ref Range   Carbamazepine Lvl 8.1 4.0 - 12.0 ug/mL    Comment: Performed at [facility]    Physical Findings: AIMS: Facial and Oral Movements Muscles of Facial Expression: None, normal Lips and Perioral Area: None, normal Jaw: None, normal Tongue: None, normal,Extremity Movements Upper (arms, wrists, hands, fingers): None, normal Lower (legs, knees, ankles, toes): None, normal, Trunk Movements Neck, shoulders, hips: None, normal, Overall Severity Severity of abnormal movements (highest score from questions above): None, normal Incapacitation due to abnormal movements: None, normal Patient's awareness of abnormal movements (rate only patient's report): No Awareness, Dental Status Current problems with teeth and/or dentures?: No Does patient usually wear dentures?: No  CIWA:  CIWA-Ar Total: 0 COWS:      See Psychiatric Specialty Exam and Suicide Risk Assessment completed by Attending Physician prior to discharge.  Discharge destination:  Home  Is patient on multiple antipsychotic therapies at discharge:  No   Has Patient had three or more failed  trials of antipsychotic monotherapy by history:  No    Recommended Plan for Multiple Antipsychotic Therapies: NA     Medication List    STOP taking these medications        carbamazepine 200 MG tablet  Commonly known as:  TEGRETOL  Replaced by:  carbamazepine 100 MG 12 hr tablet     chlorproMAZINE 100 MG tablet  Commonly known as:  THORAZINE     clonazePAM 1 MG tablet  Commonly known as:  KLONOPIN     paliperidone 6 MG 24 hr tablet  Commonly known as:  INVEGA     traZODone 50 MG tablet  Commonly known as:  DESYREL      TAKE these medications      Indication   carbamazepine 100 MG 12 hr tablet  Commonly known as:  TEGRETOL XR  Take 1 tablet (100 mg total) by mouth 2 (two) times daily.   Indication:  Mood Stabilization     hydrOXYzine 25 MG tablet  Commonly known as:  ATARAX/VISTARIL  Take 1 tablet (25 mg total) by mouth every 4 (four) hours as needed for anxiety.   Indication:  Anxiety Neurosis  paliperidone 156 MG/ML Susp injection  Commonly known as:  INVEGA SUSTENNA  Inject 1 mL (156 mg total) into the muscle once. Next dose due on 09/01/2014.  Start taking on:  09/01/2014   Indication:  Mood Stabilization     paliperidone 156 MG/ML Susp injection  Commonly known as:  INVEGA SUSTENNA  Inject 0.75 mLs (117 mg total) into the muscle every 28 (twenty-eight) days. Please note the next dose that is due 09/29/2014  Start taking on:  09/29/2014   Indication:  Mood Stabilization           Follow-up Information    Follow up with [facility].   Why:  Call Judeth Cornfield to find out when they will see you to open you for services.   Contact information:   [address]  [place]  [phone]      Follow-up recommendations:  Activity:  as tol, diet as tol  Comments:  1.  Take all your medications as prescribed.              2.  Report any adverse side effects to outpatient provider.                       3.  Patient instructed to not use  alcohol or illegal drugs while on prescription medicines.            4.  In the event of worsening symptoms, instructed patient to call 911, the crisis hotline or go to nearest emergency room for evaluation of symptoms.  Total Discharge Time: 30 min  Signed: Velna Hatchet May Alquan Morrish  AGNP-BC  08/27/2014, 6:50 PM

## 2014-08-27 NOTE — Tx Team (Signed)
Interdisciplinary Treatment Plan Update (Adult)  Date:  08/27/2014   Time Reviewed:  2:41 PM   Progress in Treatment: Attending groups:  Participating in groups:  Taking medication as prescribed:  Yes. Tolerating medication:  Yes. Family/Significant othe contact made:  Yes Patient understands diagnosis:  Yes  Discussing patient identified problems/goals with staff:  Yes, see initial care plan. Medical problems stabilized or resolved:  Yes. Denies suicidal/homicidal ideation: Yes. Issues/concerns per patient self-inventory:  No. Other:  New problem(s) identified:  Discharge Plan or Barriers:  retuern home, follow up outpt  Reason for Continuation of Hospitalization:   Comments:  Estimated length of stay: D/C today  New goal(s):  Review of initial/current patient goals per problem list:     Attendees: Patient:  08/27/2014 2:41 PM   Family:   08/27/2014 2:41 PM   Physician:  Jomarie Longs, MD 08/27/2014 2:41 PM   Nursing:   Earl Many, RN 08/27/2014 2:41 PM   CSW:    Daryel Gerald, LCSW   08/27/2014 2:41 PM   Other:  08/27/2014 2:41 PM   Other:   08/27/2014 2:41 PM   Other:  Onnie Boer, Nurse CM 08/27/2014 2:41 PM   Other:  Leisa Lenz, Vesta Mixer TCT 08/27/2014 2:41 PM   Other:  Tomasita Morrow, P4CC  08/27/2014 2:41 PM   Other:  08/27/2014 2:41 PM   Other:  08/27/2014 2:41 PM   Other:  08/27/2014 2:41 PM   Other:  08/27/2014 2:41 PM   Other:  08/27/2014 2:41 PM   Other:   08/27/2014 2:41 PM    Scribe for Treatment Team:   Ida Rogue, 08/27/2014 2:41 PM

## 2014-08-27 NOTE — Progress Notes (Signed)
[name], female   DOB: 1976-01-02, 39 y.o.   MRN: [id number] Patient discharged per MD orders. Patient given education regarding follow-up appointments and medications. Patient denies any questions or concerns about these instructions. Patient was escorted to locker and given belongings before discharge to hospital lobby. Patient currently denies SI/HI and auditory and visual hallucinations on discharge.

## 2014-08-27 NOTE — [facility] Suicide Risk Assessment (Signed)
[facility] INPATIENT:  Family/Significant Other Suicide Prevention Education  Suicide Prevention Education:  Education Completed; No one has been identified by the patient as the family member/significant other with whom the patient will be residing, and identified as the person(s) who will aid the patient in the event of a mental health crisis (suicidal ideations/suicide attempt).  With written consent from the patient, the family member/significant other has been provided the following suicide prevention education, prior to the and/or following the discharge of the patient.  The suicide prevention education provided includes the following:  Suicide risk factors  Suicide prevention and interventions  National Suicide Hotline telephone number  [facility] assessment telephone number  [facility] Emergency Assistance 911  [facility] and/or Residential Mobile Crisis Unit telephone number  Request made of family/significant other to:  Remove weapons (e.g., guns, rifles, knives), all items previously/currently identified as safety concern.    Remove drugs/medications (over-the-counter, prescriptions, illicit drugs), all items previously/currently identified as a safety concern.  The family member/significant other verbalizes understanding of the suicide prevention education information provided.  The family member/significant other agrees to remove the items of safety concern listed above. The patient did not endorse SI at the time of admission, nor did the patient c/o SI during the stay here.  SPE not required.  However, did talk to Ms Mardella Layman, mother, [phone], about crises plan.   [name] 08/27/2014, 2:46 PM

## 2014-08-27 NOTE — Progress Notes (Signed)
Recreation Therapy Notes  06.22.16 @ 925 LRT followed up with patient about coping skills and stress management.  Patient stated she was feeling good and waiting to go home.  Patient stated she was ready.  Patient stated she has her coping skills and deep breathing and needs to use them so she won't keep coming here.   Caroll Rancher, LRT/CTRS  [place], [place] A 08/27/2014 4:51 PM

## 2014-08-27 NOTE — [facility] Suicide Risk Assessment (Signed)
[facility] Discharge Suicide Risk Assessment   Demographic Factors:  Living alone  Total Time spent with patient: 30 minutes  Musculoskeletal: Strength & Muscle Tone: within normal limits Gait & Station: normal Patient leans: N/A  Psychiatric Specialty Exam: Physical Exam  Review of Systems  Psychiatric/Behavioral: Positive for substance abuse (stable).  All other systems reviewed and are negative.   Blood pressure 116/71, pulse 97, temperature 98.3 F (36.8 C), temperature source Oral, resp. rate 14, height 5\' 3"  (1.6 m), weight 88.905 kg (196 lb), last menstrual period 08/15/2014, SpO2 100 %.Body mass index is 34.73 kg/(m^2).  General Appearance: Casual  Eye Contact::  Fair  Speech:  Clear and Coherent409  Volume:  Normal  Mood:  Euthymic  Affect:  Congruent  Thought Process:  Goal Directed  Orientation:  Full (Time, Place, and Person)  Thought Content:  WDL  Suicidal Thoughts:  No  Homicidal Thoughts:  No  Memory:  Immediate;   Fair Recent;   Fair Remote;   Fair  Judgement:  Fair  Insight:  Fair  Psychomotor Activity:  Normal  Concentration:  Fair  Recall:  [employer] of Knowledge:Fair  Language: Fair  Akathisia:  No  Handed:  Right  AIMS (if indicated):     Assets:  Communication Skills Desire for Improvement  Sleep:  Number of Hours: 6.75  Cognition: WNL  ADL's:  Intact   Have you used any form of tobacco in the last 30 days? (Cigarettes, Smokeless Tobacco, Cigars, and/or Pipes): Yes  Has this patient used any form of tobacco in the last 30 days? (Cigarettes, Smokeless Tobacco, Cigars, and/or Pipes) Yes, A prescription for an FDA-approved tobacco cessation medication was offered at discharge and the patient refused  Mental Status Per Nursing Assessment::   On Admission:  NA  Current Mental Status by Physician: pt denies SIHI/AH/VH  Loss Factors: NA  Historical Factors: Impulsivity  Risk Reduction Factors:   Positive social support  Continued Clinical  Symptoms:  Previous Psychiatric Diagnoses and Treatments  Cognitive Features That Contribute To Risk:  None    Suicide Risk:  Minimal: No identifiable suicidal ideation.  Patients presenting with no risk factors but with morbid ruminations; may be classified as minimal risk based on the severity of the depressive symptoms  Principal Problem: Bipolar disorder, current episode manic w/o psychotic features, severe Discharge Diagnoses:  Patient Active Problem List   Diagnosis Date Noted  . Bipolar disorder, current episode manic w/o psychotic features, severe [F31.13] 08/21/2014  . Cannabis use disorder, moderate, dependence [F12.20] 08/21/2014  . Tobacco use disorder [Z72.0] 08/21/2014      Plan Of Care/Follow-up recommendations:  Activity:  No restrictions Diet:  regular Tests:  as needed Other:  follow up with after care  Is patient on multiple antipsychotic therapies at discharge:  No   Has Patient had three or more failed trials of antipsychotic monotherapy by history:  No  Recommended Plan for Multiple Antipsychotic Therapies: NA    Gitty Osterlund MD 08/27/2014, 1:18 PM

## 2014-08-27 NOTE — Progress Notes (Signed)
  [facility] Adult Case Management Discharge Plan :  Will you be returning to the same living situation after discharge:  Yes,  home At discharge, do you have transportation home?: Yes,  mother Do you have the ability to pay for your medications: Yes,  MCD  Release of information consent forms completed and in the chart;  Patient's signature needed at discharge.  Patient to Follow up at: Follow-up Information    Follow up with [facility].   Why:  Call Judeth Cornfield to find out when they will see you to open you for services.   Contact information:   [address]  [place]  [phone]      Patient denies SI/HI: Yes,  yes    Safety Planning and Suicide Prevention discussed: Yes,  yes  Have you used any form of tobacco in the last 30 days? (Cigarettes, Smokeless Tobacco, Cigars, and/or Pipes): Yes  Has patient been referred to the Quitline?: Patient refused referral  Ida Rogue 08/27/2014, 2:40 PM

## 2014-08-27 NOTE — Plan of Care (Signed)
Problem: Alteration in thought process Goal: LTG-Patient has not harmed self or others in at least 2 days Outcome: Completed/Met Date Met:  08/27/14 Patient denies SI/HI and AVH.  Patient has been pleasant and cooperative with.

## 2014-09-23 ENCOUNTER — Emergency Department (HOSPITAL_COMMUNITY)
Admission: EM | Admit: 2014-09-23 | Discharge: 2014-09-24 | Disposition: A | Discharge status: Other Acute Inpatient Hospital | Payer: Medicare Other | Attending: Emergency Medicine | Admitting: Emergency Medicine

## 2014-09-23 ENCOUNTER — Encounter (HOSPITAL_COMMUNITY): Payer: Self-pay

## 2014-09-23 DIAGNOSIS — Z720 Tobacco use: Secondary | ICD-10-CM | POA: Insufficient documentation

## 2014-09-23 DIAGNOSIS — R45850 Homicidal ideations: Secondary | ICD-10-CM | POA: Diagnosis not present

## 2014-09-23 DIAGNOSIS — F419 Anxiety disorder, unspecified: Secondary | ICD-10-CM | POA: Diagnosis not present

## 2014-09-23 DIAGNOSIS — F42 Obsessive-compulsive disorder: Secondary | ICD-10-CM | POA: Diagnosis not present

## 2014-09-23 DIAGNOSIS — F312 Bipolar disorder, current episode manic severe with psychotic features: Secondary | ICD-10-CM | POA: Diagnosis not present

## 2014-09-23 DIAGNOSIS — F3113 Bipolar disorder, current episode manic without psychotic features, severe: Secondary | ICD-10-CM | POA: Diagnosis not present

## 2014-09-23 DIAGNOSIS — Z3202 Encounter for pregnancy test, result negative: Secondary | ICD-10-CM | POA: Insufficient documentation

## 2014-09-23 DIAGNOSIS — Z79899 Other long term (current) drug therapy: Secondary | ICD-10-CM | POA: Insufficient documentation

## 2014-09-23 DIAGNOSIS — R45851 Suicidal ideations: Secondary | ICD-10-CM | POA: Diagnosis not present

## 2014-09-23 LAB — CBC
HCT: 34.7 % — ABNORMAL LOW (ref 36.0–46.0)
Hemoglobin: 11 g/dL — ABNORMAL LOW (ref 12.0–15.0)
MCH: 25.9 pg — AB (ref 26.0–34.0)
MCHC: 31.7 g/dL (ref 30.0–36.0)
MCV: 81.8 fL (ref 78.0–100.0)
Platelets: 347 10*3/uL (ref 150–400)
RBC: 4.24 MIL/uL (ref 3.87–5.11)
RDW: 16.3 % — ABNORMAL HIGH (ref 11.5–15.5)
WBC: 8.3 10*3/uL (ref 4.0–10.5)

## 2014-09-23 LAB — COMPREHENSIVE METABOLIC PANEL
ALT: 16 U/L (ref 14–54)
AST: 22 U/L (ref 15–41)
Albumin: 4.6 g/dL (ref 3.5–5.0)
Alkaline Phosphatase: 59 U/L (ref 38–126)
Anion gap: 5 (ref 5–15)
BILIRUBIN TOTAL: 0.7 mg/dL (ref 0.3–1.2)
BUN: 11 mg/dL (ref 6–20)
CO2: 21 mmol/L — AB (ref 22–32)
Calcium: 8.9 mg/dL (ref 8.9–10.3)
Chloride: 109 mmol/L (ref 101–111)
Creatinine, Ser: 1.21 mg/dL — ABNORMAL HIGH (ref 0.44–1.00)
GFR calc Af Amer: >60 mL/min (ref >60)
GFR, EST NON AFRICAN AMERICAN: 57 mL/min — AB (ref >60)
Glucose, Bld: 144 mg/dL — ABNORMAL HIGH (ref 65–99)
Potassium: 3.5 mmol/L (ref 3.5–5.1)
Sodium: 135 mmol/L (ref 135–145)
Total Protein: 9.3 g/dL — ABNORMAL HIGH (ref 6.5–8.1)

## 2014-09-23 LAB — ACETAMINOPHEN LEVEL: Acetaminophen (Tylenol), Serum: <10 ug/mL — ABNORMAL LOW (ref 10–30)

## 2014-09-23 LAB — SALICYLATE LEVEL: Salicylate Lvl: <4 mg/dL (ref 2.8–30.0)

## 2014-09-23 LAB — ETHANOL: Alcohol, Ethyl (B): <5 mg/dL (ref <5)

## 2014-09-23 MED ORDER — STERILE WATER FOR INJECTION IJ SOLN
INTRAMUSCULAR | Status: AC
  Administered 2014-09-23
  Filled 2014-09-23: qty 10

## 2014-09-23 MED ORDER — CARBAMAZEPINE ER 100 MG PO TB12
100.0000 mg | ORAL_TABLET | Freq: Two times a day (BID) | ORAL | Status: DC
  Filled 2014-09-23 (×4): qty 1

## 2014-09-23 MED ORDER — ACETAMINOPHEN 325 MG PO TABS: 650.0000 mg | ORAL_TABLET | ORAL | Status: DC | PRN

## 2014-09-23 MED ORDER — LORAZEPAM 0.5 MG PO TABS
1.0000 mg | ORAL_TABLET | Freq: Three times a day (TID) | ORAL | Status: DC | PRN
  Filled 2014-09-23 (×2): qty 2

## 2014-09-23 MED ORDER — HYDROXYZINE HCL 25 MG PO TABS
25.0000 mg | ORAL_TABLET | ORAL | Status: DC | PRN
  Filled 2014-09-23: qty 1

## 2014-09-23 MED ORDER — ONDANSETRON HCL 4 MG PO TABS: 4.0000 mg | ORAL_TABLET | Freq: Three times a day (TID) | ORAL | Status: DC | PRN

## 2014-09-23 MED ORDER — IBUPROFEN 200 MG PO TABS: 600.0000 mg | ORAL_TABLET | Freq: Three times a day (TID) | ORAL | Status: DC | PRN

## 2014-09-23 MED ORDER — ALUM & MAG HYDROXIDE-SIMETH 200-200-20 MG/5ML PO SUSP
30.0000 mL | ORAL | Status: DC | PRN
Start: 2014-09-23 — End: 2014-09-24

## 2014-09-23 MED ORDER — ZOLPIDEM TARTRATE 5 MG PO TABS: 5.0000 mg | ORAL_TABLET | Freq: Every evening | ORAL | Status: DC | PRN

## 2014-09-23 MED ORDER — BENZTROPINE MESYLATE 1 MG PO TABS
0.5000 mg | ORAL_TABLET | Freq: Every evening | ORAL | Status: DC
  Administered 2014-09-24: 0.5 mg via ORAL
  Filled 2014-09-23: qty 1

## 2014-09-23 MED ORDER — TRAZODONE HCL 50 MG PO TABS
50.0000 mg | ORAL_TABLET | Freq: Every day | ORAL | Status: DC
Start: 2014-09-23 — End: 2014-09-24

## 2014-09-23 MED ORDER — NICOTINE 21 MG/24HR TD PT24
21.0000 mg | MEDICATED_PATCH | Freq: Every day | TRANSDERMAL | Status: DC
  Filled 2014-09-23: qty 1

## 2014-09-23 MED ORDER — CARBAMAZEPINE 200 MG PO TABS
400.0000 mg | ORAL_TABLET | Freq: Every day | ORAL | Status: DC
  Filled 2014-09-23 (×2): qty 2

## 2014-09-23 MED ORDER — ZIPRASIDONE MESYLATE 20 MG IM SOLR
20.0000 mg | Freq: Once | INTRAMUSCULAR | Status: AC
  Administered 2014-09-23: 20 mg via INTRAMUSCULAR
  Filled 2014-09-23: qty 20

## 2014-09-23 NOTE — ED Notes (Signed)
Pt currently very uncooperative and violent towards staff. Medication give, will attempt to draw blood when pt calms.

## 2014-09-23 NOTE — ED Notes (Signed)
Pt presents with c/o homicidal thoughts and manic behavior. Pt reports that she is feeling homicidal towards her mother and brother and "this stupid ass bitch" (pt referring to a visitor in the room with her). Pt is very manic at this time and is cussing at every person in the room and says "I'm slim shady". Pt reports that she is here because of this and when asked if she is suicidal she says "fuck no".

## 2014-09-23 NOTE — ED Notes (Signed)
Bed: WA17 Expected date:  Expected time:  Means of arrival:  Comments: Hold for triage 5 

## 2014-09-23 NOTE — ED Provider Notes (Signed)
CSN: [id number]     Arrival date & time 09/23/14  2159 History   First MD Initiated Contact with Patient 09/23/14 2212     Chief Complaint  Patient presents with  . Homicidal  . Manic Behavior     (Consider location/radiation/quality/duration/timing/severity/associated sxs/prior Treatment) Patient is a 39 y.o. female presenting with mental health disorder. The history is provided by the patient.  Mental Health Problem Presenting symptoms: homicidal ideas, paranoid behavior, suicidal thoughts and suicidal threats   Patient accompanied by:  Law enforcement Degree of incapacity (severity):  Moderate Onset quality:  Gradual Timing:  Constant Progression:  Unchanged Chronicity:  Recurrent Relieved by:  Nothing Worsened by:  Nothing tried Associated symptoms: no abdominal pain and no chest pain     Past Medical History  Diagnosis Date  . Bipolar affective   . OCD (obsessive compulsive disorder)   . Anxiety    History reviewed. No pertinent past surgical history. Family History  Problem Relation Age of Onset  . Drug abuse Father   . Mental illness Other    History  Substance Use Topics  . Smoking status: Current Every Day Smoker -- 1.00 packs/day    Types: Cigarettes  . Smokeless tobacco: Never Used  . Alcohol Use: Yes     Comment: socially   OB History    No data available     Review of Systems  Constitutional: Negative for fever and chills.  Respiratory: Negative for cough and shortness of breath.   Cardiovascular: Negative for chest pain.  Gastrointestinal: Negative for vomiting and abdominal pain.  Psychiatric/Behavioral: Positive for suicidal ideas, homicidal ideas and paranoia.  All other systems reviewed and are negative.     Allergies  Depakote  Home Medications   Prior to Admission medications   Medication Sig Start Date End Date Taking? Authorizing Provider  clonazePAM (KLONOPIN) 1 MG tablet Take 4 mg by mouth at bedtime. 08/12/14  Yes Historical  Provider, MD  benztropine (COGENTIN) 0.5 MG tablet Take 1 tablet by mouth every evening. At 8pm 08/28/14   Historical Provider, MD  carbamazepine (TEGRETOL XR) 100 MG 12 hr tablet Take 1 tablet (100 mg total) by mouth 2 (two) times daily. 08/27/14   Adonis Brook, NP  carbamazepine (TEGRETOL) 200 MG tablet Take 400 mg by mouth at bedtime. 08/12/14   Historical Provider, MD  hydrOXYzine (ATARAX/VISTARIL) 25 MG tablet Take 1 tablet (25 mg total) by mouth every 4 (four) hours as needed for anxiety. 08/27/14   Adonis Brook, NP  paliperidone (INVEGA SUSTENNA) 156 MG/ML SUSP injection Inject 0.75 mLs (117 mg total) into the muscle every 28 (twenty-eight) days. Please note the next dose that is due 09/29/2014 09/29/14   Adonis Brook, NP  paliperidone (INVEGA SUSTENNA) 156 MG/ML SUSP injection Inject 1 mL (156 mg total) into the muscle once. Next dose due on 09/01/2014. 09/01/14   Adonis Brook, NP  traZODone (DESYREL) 50 MG tablet Take 1-2 tablets by mouth at bedtime. 08/12/14   Historical Provider, MD   BP 150/84 mmHg  Pulse 117  Temp(Src) 98 F (36.7 C) (Oral)  Resp 19  SpO2 100%  LMP 09/23/2014 (Approximate) Physical Exam  Constitutional: She is oriented to person, place, and time. She appears well-developed and well-nourished. No distress.  HENT:  Head: Normocephalic and atraumatic.  Mouth/Throat: Oropharynx is clear and moist.  Eyes: EOM are normal. Pupils are equal, round, and reactive to light.  Neck: Normal range of motion. Neck supple.  Cardiovascular: Normal rate and regular rhythm.  Exam reveals no friction rub.   No murmur heard. Pulmonary/Chest: Effort normal and breath sounds normal. No respiratory distress. She has no wheezes. She has no rales.  Abdominal: Soft. She exhibits no distension. There is no tenderness. There is no rebound.  Musculoskeletal: Normal range of motion. She exhibits no edema.  Neurological: She is alert and oriented to person, place, and time. No cranial nerve  deficit. She exhibits normal muscle tone. Coordination normal.  Skin: Skin is warm. No rash noted. She is not diaphoretic.  Psychiatric: She expresses homicidal and suicidal ideation. She expresses suicidal plans and homicidal plans.  Nursing note and vitals reviewed.   ED Course  Procedures (including critical care time) Labs Review Labs Reviewed  COMPREHENSIVE METABOLIC PANEL  ETHANOL  SALICYLATE LEVEL  ACETAMINOPHEN LEVEL  CBC  URINE RAPID DRUG SCREEN, HOSP PERFORMED  I-STAT BETA HCG BLOOD, ED ([facility], [facility], [facility] ONLY)    Imaging Review No results found.   EKG Interpretation None      MDM   Final diagnoses:  Bipolar affective disorder, currently manic, severe, with psychotic features  Homicidal ideation    39 year old female here with homicidal behavior. Brought in by brother and mother. She apparently wrote a suicide note and took her phone. She is in the hallway stating she is going to kill them. She is a flight risk IVC medially filed and given Geodon since she is uncooperative. Will consult TTS.    Elwin MochaBlair Rahcel Shutes, MD 09/23/14 [phone]

## 2014-09-24 ENCOUNTER — Encounter (HOSPITAL_COMMUNITY): Payer: Self-pay

## 2014-09-24 ENCOUNTER — Inpatient Hospital Stay (HOSPITAL_COMMUNITY)
Admission: AD | Admit: 2014-09-24 | Discharge: 2014-10-02 | DRG: 885 | Disposition: A | Discharge status: Home / Self Care | Payer: Medicare Other | Source: Intra-hospital | Attending: Psychiatry | Admitting: Psychiatry

## 2014-09-24 DIAGNOSIS — F42 Obsessive-compulsive disorder: Secondary | ICD-10-CM | POA: Diagnosis present

## 2014-09-24 DIAGNOSIS — R45850 Homicidal ideations: Secondary | ICD-10-CM | POA: Insufficient documentation

## 2014-09-24 DIAGNOSIS — F312 Bipolar disorder, current episode manic severe with psychotic features: Secondary | ICD-10-CM | POA: Diagnosis not present

## 2014-09-24 DIAGNOSIS — F410 Panic disorder [episodic paroxysmal anxiety] without agoraphobia: Secondary | ICD-10-CM | POA: Diagnosis present

## 2014-09-24 DIAGNOSIS — F1220 Cannabis dependence, uncomplicated: Secondary | ICD-10-CM | POA: Diagnosis present

## 2014-09-24 DIAGNOSIS — F17210 Nicotine dependence, cigarettes, uncomplicated: Secondary | ICD-10-CM | POA: Diagnosis present

## 2014-09-24 DIAGNOSIS — Z9114 Patient's other noncompliance with medication regimen: Secondary | ICD-10-CM | POA: Diagnosis present

## 2014-09-24 DIAGNOSIS — F3113 Bipolar disorder, current episode manic without psychotic features, severe: Secondary | ICD-10-CM | POA: Diagnosis not present

## 2014-09-24 DIAGNOSIS — G4700 Insomnia, unspecified: Secondary | ICD-10-CM | POA: Diagnosis present

## 2014-09-24 DIAGNOSIS — F17200 Nicotine dependence, unspecified, uncomplicated: Secondary | ICD-10-CM

## 2014-09-24 DIAGNOSIS — R45851 Suicidal ideations: Secondary | ICD-10-CM | POA: Diagnosis not present

## 2014-09-24 LAB — I-STAT BETA HCG BLOOD, ED (MC, WL, AP ONLY): I-stat hCG, quantitative: <5 m[IU]/mL (ref <5)

## 2014-09-24 MED ORDER — BENZTROPINE MESYLATE 0.5 MG PO TABS
0.5000 mg | ORAL_TABLET | Freq: Every evening | ORAL | Status: DC
  Administered 2014-09-24: 0.5 mg via ORAL
  Filled 2014-09-24 (×4): qty 1

## 2014-09-24 MED ORDER — LORAZEPAM 2 MG/ML IJ SOLN
2.0000 mg | Freq: Once | INTRAMUSCULAR | Status: AC
  Administered 2014-09-24: 2 mg via INTRAMUSCULAR
  Filled 2014-09-24: qty 1

## 2014-09-24 MED ORDER — CLONAZEPAM 1 MG PO TABS
2.0000 mg | ORAL_TABLET | Freq: Three times a day (TID) | ORAL | Status: DC | PRN
  Administered 2014-09-24 – 2014-09-25 (×4): 2 mg via ORAL
  Filled 2014-09-24 (×4): qty 2

## 2014-09-24 MED ORDER — OLANZAPINE 10 MG IM SOLR: 10.0000 mg | Freq: Once | INTRAMUSCULAR | Status: DC | PRN

## 2014-09-24 MED ORDER — DIPHENHYDRAMINE HCL 50 MG/ML IJ SOLN
50.0000 mg | Freq: Once | INTRAMUSCULAR | Status: AC
  Administered 2014-09-24: 50 mg via INTRAMUSCULAR
  Filled 2014-09-24: qty 1

## 2014-09-24 MED ORDER — CARBAMAZEPINE ER 100 MG PO TB12
100.0000 mg | ORAL_TABLET | Freq: Two times a day (BID) | ORAL | Status: DC
  Administered 2014-09-24: 100 mg via ORAL
  Filled 2014-09-24 (×7): qty 1

## 2014-09-24 MED ORDER — ALUM & MAG HYDROXIDE-SIMETH 200-200-20 MG/5ML PO SUSP: 30.0000 mL | ORAL | Status: DC | PRN

## 2014-09-24 MED ORDER — ASENAPINE MALEATE 5 MG SL SUBL
5.0000 mg | SUBLINGUAL_TABLET | Freq: Once | SUBLINGUAL | Status: AC
  Administered 2014-09-24: 5 mg via SUBLINGUAL
  Filled 2014-09-24: qty 1

## 2014-09-24 MED ORDER — LORAZEPAM 0.5 MG PO TABS: 2.0000 mg | ORAL_TABLET | Freq: Once | ORAL | Status: DC

## 2014-09-24 MED ORDER — TRAZODONE HCL 50 MG PO TABS
50.0000 mg | ORAL_TABLET | Freq: Every evening | ORAL | Status: DC | PRN
  Administered 2014-09-24 – 2014-10-01 (×7): 50 mg via ORAL
  Filled 2014-09-24 (×19): qty 1

## 2014-09-24 MED ORDER — TRAZODONE HCL 100 MG PO TABS: 100.0000 mg | ORAL_TABLET | Freq: Every day | ORAL | Status: DC

## 2014-09-24 MED ORDER — ZIPRASIDONE HCL 20 MG PO CAPS
20.0000 mg | ORAL_CAPSULE | Freq: Once | ORAL | Status: AC
  Administered 2014-09-24: 20 mg via ORAL
  Filled 2014-09-24 (×2): qty 1

## 2014-09-24 MED ORDER — ACETAMINOPHEN 325 MG PO TABS: 650.0000 mg | ORAL_TABLET | ORAL | Status: DC | PRN

## 2014-09-24 MED ORDER — MAGNESIUM HYDROXIDE 400 MG/5ML PO SUSP: 30.0000 mL | Freq: Every day | ORAL | Status: DC | PRN

## 2014-09-24 MED ORDER — LORAZEPAM 0.5 MG PO TABS
2.0000 mg | ORAL_TABLET | Freq: Four times a day (QID) | ORAL | Status: DC | PRN
  Administered 2014-09-24: 2 mg via ORAL
  Filled 2014-09-24: qty 4

## 2014-09-24 MED ORDER — ACETAMINOPHEN 325 MG PO TABS
650.0000 mg | ORAL_TABLET | Freq: Four times a day (QID) | ORAL | Status: DC | PRN
  Administered 2014-09-27 – 2014-09-28 (×3): 650 mg via ORAL
  Filled 2014-09-24 (×3): qty 2

## 2014-09-24 MED ORDER — ZIPRASIDONE MESYLATE 20 MG IM SOLR
10.0000 mg | Freq: Once | INTRAMUSCULAR | Status: AC
  Administered 2014-09-24: 10 mg via INTRAMUSCULAR
  Filled 2014-09-24: qty 20

## 2014-09-24 MED ORDER — HYDROXYZINE HCL 25 MG PO TABS
25.0000 mg | ORAL_TABLET | ORAL | Status: DC | PRN
  Administered 2014-09-24 – 2014-09-27 (×5): 25 mg via ORAL
  Filled 2014-09-24 (×3): qty 1
  Filled 2014-09-24: qty 20
  Filled 2014-09-24 (×2): qty 1

## 2014-09-24 MED ORDER — IBUPROFEN 200 MG PO TABS: 600.0000 mg | ORAL_TABLET | Freq: Three times a day (TID) | ORAL | Status: DC | PRN
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
## 2014-09-24 NOTE — Progress Notes (Addendum)
Pt placed on 1:1 due to pt impulsiveness,pt poor boundaries, and  Constant staff intervention to keep pt under control. Pt 1st note started on paper with sitter.

## 2014-09-24 NOTE — Progress Notes (Signed)
"  when I leave i'm not going to take my medications, what sense does it make i take them now, I'm only going to take them so i can get out"

## 2014-09-24 NOTE — ED Notes (Signed)
Denies SI, AVH. Reports HI toward her mother. Upset with mom and brother over money. States she has access to weapons. Declines all medications at present. Defensive, argumentative.  Encouragement offered.   Q 15 safety checks in place.

## 2014-09-24 NOTE — ED Notes (Signed)
[facility] Expected date: 09/24/14 Expected time: 5:28 AM Means of arrival:  Comments: Hold for [employer]

## 2014-09-24 NOTE — ED Notes (Signed)
Pt refused Ativan this am

## 2014-09-24 NOTE — Tx Team (Addendum)
Initial Interdisciplinary Treatment Plan   PATIENT STRESSORS: Financial difficulties Medication change or noncompliance   PATIENT STRENGTHS: Financial means General fund of knowledge Supportive family/friends   PROBLEM LIST: Problem List/Patient Goals Date to be addressed Date deferred Reason deferred Estimated date of resolution  Risk for suicide 09/24/14     HI 09/24/14     Irritability " you are all bitches" 09/24/14     Delusions 09/24/14     " I don't need to be here" 09/25/14                              DISCHARGE CRITERIA:  Improved stabilization in mood, thinking, and/or behavior Verbal commitment to aftercare and medication compliance  PRELIMINARY DISCHARGE PLAN: Outpatient therapy  PATIENT/FAMIILY INVOLVEMENT: This treatment plan has been presented to and reviewed with the patient, [name].  The patient and family have been given the opportunity to ask questions and make suggestions.  Jacques Navyhillips, Michael A 09/24/2014, 11:19 PM

## 2014-09-24 NOTE — Progress Notes (Signed)
Pt disruptive when trying to give her medications. "YOU'RE STUPID, SHUT UP , FUCK YOU , SUCK A DICK !"
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
## 2014-09-24 NOTE — [facility] Counselor (Signed)
[facility] Dr. Elna BreslowEappen attending. Patient can transport after shift change.  Call [phone] for Nurse report. Patients nurse notified and EDP aware of acceptance.

## 2014-09-24 NOTE — BH Assessment (Signed)
Reviewed ED notes prior to initiating assessment. Pt had made homicidal and suicidal comments. [facility]. Pt was unable to be assessed as she was violent and uncooperative in ED and was given Geodon and is now sedated and sleeping soundly.    Clista BernhardtNancy Wynee Matarazzo, [facility] Triage Specialist 09/24/2014 12:08 AM
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
## 2014-09-24 NOTE — ED Notes (Signed)
Pt loud, intrusive, verbally abusive and threatening towards staff and peers. Verbal redirection offered. All medication administered as ordered including scheduled and one time orders for agitation and aggression throughout this shift (see emar). Emotional support and availability offered. Safety maintained on Q 15 minutes checks for safety. Pt asleep at present. Respirations noted.
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
## 2014-09-24 NOTE — BH Assessment (Signed)
Pt still sleeping soundly. RN will contact TTS if pt arouses is able to be assessed. RN will attempt to awake pt at 0600 to transfer to SAPU, and TTS will attempt consult at that time.    Clista BernhardtNancy Nil Xiong, [facility] Triage Specialist 09/24/2014 3:40 AM

## 2014-09-24 NOTE — ED Provider Notes (Signed)
Per TTS[facility]  Deysy Schabel, MD 09/24/14 1919

## 2014-09-24 NOTE — ED Notes (Signed)
Patient argumentative, continues with verbal threats against staff. Given Benadryl, Ativan, Geodon as ordered.   Q 15 safety checks continue.

## 2014-09-25 ENCOUNTER — Encounter (HOSPITAL_COMMUNITY): Payer: Self-pay | Admitting: Psychiatry

## 2014-09-25 DIAGNOSIS — R45850 Homicidal ideations: Secondary | ICD-10-CM

## 2014-09-25 DIAGNOSIS — F312 Bipolar disorder, current episode manic severe with psychotic features: Principal | ICD-10-CM

## 2014-09-25 MED ORDER — QUETIAPINE FUMARATE 200 MG PO TABS
200.0000 mg | ORAL_TABLET | Freq: Every day | ORAL | Status: DC
Start: 2014-09-25 — End: 2014-09-25
  Filled 2014-09-25 (×2): qty 1

## 2014-09-25 MED ORDER — CARBAMAZEPINE ER 200 MG PO TB12
200.0000 mg | ORAL_TABLET | Freq: Two times a day (BID) | ORAL | Status: DC
  Administered 2014-09-26 – 2014-09-28 (×5): 200 mg via ORAL
  Filled 2014-09-25 (×9): qty 1

## 2014-09-25 MED ORDER — HALOPERIDOL 5 MG PO TABS
5.0000 mg | ORAL_TABLET | Freq: Two times a day (BID) | ORAL | Status: DC
  Administered 2014-09-25 – 2014-10-02 (×14): 5 mg via ORAL
  Filled 2014-09-25 (×18): qty 1

## 2014-09-25 MED ORDER — CLONAZEPAM 1 MG PO TABS: 1.0000 mg | ORAL_TABLET | Freq: Two times a day (BID) | ORAL | Status: DC

## 2014-09-25 MED ORDER — LORAZEPAM 1 MG PO TABS: 1.0000 mg | ORAL_TABLET | Freq: Four times a day (QID) | ORAL | Status: DC | PRN

## 2014-09-25 MED ORDER — CLONAZEPAM 0.5 MG PO TABS
0.5000 mg | ORAL_TABLET | Freq: Three times a day (TID) | ORAL | Status: DC | PRN
  Administered 2014-09-25: 0.5 mg via ORAL
  Filled 2014-09-25: qty 1

## 2014-09-25 MED ORDER — LORAZEPAM 2 MG/ML IJ SOLN: 1.0000 mg | Freq: Four times a day (QID) | INTRAMUSCULAR | Status: DC | PRN

## 2014-09-25 MED ORDER — CLONAZEPAM 1 MG PO TABS
1.0000 mg | ORAL_TABLET | Freq: Two times a day (BID) | ORAL | Status: DC
  Administered 2014-09-25: 1 mg via ORAL
  Filled 2014-09-25: qty 1

## 2014-09-25 MED ORDER — HALOPERIDOL 5 MG PO TABS
5.0000 mg | ORAL_TABLET | Freq: Three times a day (TID) | ORAL | Status: DC | PRN
  Administered 2014-09-26 – 2014-09-27 (×3): 5 mg via ORAL
  Filled 2014-09-25 (×4): qty 1

## 2014-09-25 MED ORDER — LORAZEPAM 1 MG PO TABS
1.0000 mg | ORAL_TABLET | Freq: Four times a day (QID) | ORAL | Status: DC | PRN
  Administered 2014-09-25 – 2014-09-30 (×11): 1 mg via ORAL
  Filled 2014-09-25 (×11): qty 1

## 2014-09-25 MED ORDER — HALOPERIDOL LACTATE 5 MG/ML IJ SOLN: 5.0000 mg | Freq: Three times a day (TID) | INTRAMUSCULAR | Status: DC | PRN

## 2014-09-25 MED ORDER — QUETIAPINE FUMARATE 50 MG PO TABS
50.0000 mg | ORAL_TABLET | Freq: Two times a day (BID) | ORAL | Status: DC
  Administered 2014-09-25: 50 mg via ORAL
  Filled 2014-09-25 (×4): qty 1

## 2014-09-25 MED ORDER — LORAZEPAM 1 MG PO TABS
1.0000 mg | ORAL_TABLET | Freq: Two times a day (BID) | ORAL | Status: DC
  Administered 2014-09-25 – 2014-10-02 (×14): 1 mg via ORAL
  Filled 2014-09-25 (×14): qty 1

## 2014-09-25 NOTE — Tx Team (Signed)
Interdisciplinary Treatment Plan Update (Adult)  Date:  09/25/2014   Time Reviewed:  8:19 AM   Progress in Treatment: Attending groups: No Participating in groups:  No Taking medication as prescribed:  Yes. Tolerating medication:  Yes. Family/Significant othe contact made:  Yes Patient understands diagnosis:  No  Limited insight Discussing patient identified problems/goals with staff:  Yes, see initial care plan. Medical problems stabilized or resolved:  Yes. Denies suicidal/homicidal ideation: Yes. Issues/concerns per patient self-inventory:  No. Other:  New problem(s) identified:  Discharge Plan or Barriers: return home, follow up outpt  Reason for Continuation of Hospitalization: Mania Medication stabilization Other; describe Mood lability  Comments:  Pt presents with c/o homicidal thoughts and manic behavior. Pt reports that she is feeling homicidal towards her mother and brother and "this stupid ass bitch" (pt referring to a visitor in the room with her). Pt is very manic at this time and is cussing at every person in the room and says "I'm slim shady". Pt reports that she is here because of this and when asked if she is suicidal she says "fuck no".  Estimated length of stay: 4-5 days  New goal(s):  Review of initial/current patient goals per problem list:     Attendees: Patient:  09/25/2014 8:19 AM   Family:   09/25/2014 8:19 AM   Physician:  Geoffery Lyons, MD 09/25/2014 8:19 AM   Nursing:   Shelda Jakes, RN 09/25/2014 8:19 AM   CSW:    Daryel Gerald, LCSW   09/25/2014 8:19 AM   Other:  09/25/2014 8:19 AM   Other:   09/25/2014 8:19 AM   Other:  Onnie Boer, Nurse CM 09/25/2014 8:19 AM   Other:  Leisa Lenz, [facility] TCT 09/25/2014 8:19 AM   Other:  Tomasita Morrow, P4CC  09/25/2014 8:19 AM   Other:  09/25/2014 8:19 AM   Other:  09/25/2014 8:19 AM   Other:  09/25/2014 8:19 AM   Other:  09/25/2014 8:19 AM   Other:  09/25/2014 8:19 AM   Other:   09/25/2014 8:19 AM    Scribe for  Treatment Team:   Ida Rogue, 09/25/2014 8:19 AM
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
## 2014-09-25 NOTE — Progress Notes (Addendum)
Pt remains very loud and sarcastic. She stated that she would not take her tegretol because it makes her feel funny. Pt did take clonipin and was given a prn dose for extreme agitation. Spoke with Dr. Jama Flavors concerning pts extreme agitation. Pt continues to call people ,"silly a MF's." Pt was instructed to go to her room if she continues this behavior. Pt contracted with Rod that should would take her seroquel and trazadone this pm. Pt has taken  seroquel,  of ativan .  of haldol and a prn dose of clonipine for extreme agitation. Pt was given an EKG and tolerated well. She continues to curse at all staff and frightens the other pts on the unit intentionally by calling them names. Pt remains a 1:1 for safety and for her aggressive behavior.6p-Pt was encouraged to sit due to all the medications she has received. Pt ate 100% of her dinner and then stated she was still hungry and needed a snack. Pt appears fixated on calling her mom again to bring her more clothes. The writer called the pts mom and left a message for mom to please bring the pt clothes. 6:20p_pt continues to be very loud and aggressive and demanding. Dr. Jama Flavors did come back to see the pt.

## 2014-09-25 NOTE — Progress Notes (Signed)
[name], female   DOB: 1976/02/10, 39 y.o.   MRN: [id number] PER STATE REGULATIONS 482.30  THIS CHART WAS REVIEWED FOR MEDICAL NECESSITY WITH RESPECT TO THE PATIENT'S ADMISSION/DURATION OF STAY.  NEXT REVIEW DATE: 09/28/14  Loura Halt, RN, BSN CASE MANAGER

## 2014-09-25 NOTE — Progress Notes (Signed)
Pt woke up yelling cursing banging on nursing station  Telling "SHUT UP BITCH"

## 2014-09-25 NOTE — Progress Notes (Addendum)
D Pt is seen out in the hall...standing in the middle of the hall with both of her arms down by her side. She yells ( in a loud shrill voice)  " hey " what the f--- are you looking at...get the f--- outta my face and leave me alone.I feel like I'm in prison in here. " Pt demonstrates manic behaviors as evidenced by her rambling, disorganized speech, her sexual inappropriate touch as well as vulgar behavior. Her boundaries are poor, interms of her intrusiveness, her lask of impuse control and her  Threatening  Patients and staff.   A She takes her scheduled klonopin at 0800 and refuses her daily tegretol and this is documented and Dr. Salena Saner made aware by this Clinical research associate.   R Safety is in place and poc cont with 1;1 in place.

## 2014-09-25 NOTE — Progress Notes (Addendum)
Pt continues to curse at staff , making verbal threats and making gestures that she is going to hit staff

## 2014-09-25 NOTE — Progress Notes (Signed)
Psychoeducational Group Note  Date:  09/25/2014 Time:  2229  Group Topic/Focus:  Wrap-Up Group:   The focus of this group is to help patients review their daily goal of treatment and discuss progress on daily workbooks.  Participation Level: Did Not Attend  Participation Quality:  Not Applicable  Affect:  Not Applicable  Cognitive:  Not Applicable  Insight:  Not Applicable  Engagement in Group: Not Applicable  Additional Comments:  The patient did not attend group this evening.   Hazle Coca S 09/25/2014, 10:29 PM

## 2014-09-25 NOTE — Progress Notes (Signed)
[name] cont to demonstrate inability to modulate her behavior, in lieu of her mania. She refused the seroquel ordered her by Dr. Jama Flavors. She requested, and received  2 prn doses of klonopin 2 mg po ( with minimal to no relief noted); the first    One  was given at 0758 and the 2nd dose was administered at 1312. She cont to have diff following staff direction. She responds with angry, aggressive - sounding  Verbage. She frequently interprets staff redirection as inappropriate, demeaning, disrespectful and wrong. She is intrusive. She gets in patients' personal space ( as well as staff's), She is more loud than she is moderate, or quiet. She is disrespectful of others' feelings and of other patients' reasons for hospitalization. This Clinical research associate has overheard her speaking on the patient phone x 3 today ( she says to her mother) and this is a sample of her part of the conversation: " what the hell do you mean...not answering the phone.Marland KitchenMarland KitchenIve been calling and leave voice mail messages ALL day... I dont give a good g------ what you think because I shouldn't be here and when I get out Im gonna let you have it...Marland KitchenMarland Kitchen"    A She argues, she manipulates, she internalizes and she takes things out of context.      R 1:1 in place and safety cont.

## 2014-09-25 NOTE — Progress Notes (Signed)
Pt up in room cursing "SHUT UP BITCH, FUCK YOU, I DON'T LIKE NO ONE WATCHING ME WHEN I'M SLEEP". Pt was up with unsteady gait, pt was very drowsy and was asked to lay in the bed repeatedly. Pt continued to yell and scream waking up other patients.

## 2014-09-25 NOTE — Progress Notes (Signed)
Pt believes she is on the 1:1 mainly due to her being a fall risk. Pt was informed that that was one of the reasons at this time, but the main reason is that she has a hard time controlling herself. Pt does not believe or comprehend this and continues to curse staff out .

## 2014-09-25 NOTE — Progress Notes (Signed)
Writer was in the dayroom talking to a tech pt came in the dayroom "SHUT UP NIGGER, SHUT UP NIGGER". Writer explained to pt that I do not use that word and you will not use it to me. Then pt began to say she does not curse. Pt tries to manipulate things, then pt started talking in a soft voice " I'm sorry, you can talk to me now I'm [name]"  Nursing 1:1 note  D:Pt continues to be belligerent to staff, and pt was allowed outside in the dayroom , but pt was cursing and being disrespectful to everyone. RR even and unlabored. No distress noted. A: 1:1 observation continues for safety  R: pt remains safe

## 2014-09-25 NOTE — Progress Notes (Signed)
Pt called Clinical research associate in room and stated she wanted to talk. Pt asked what she took , she was told Klonipin and vistaril, pt yelling "I don't take Vistaril, don't prescribe me vistaril". Pt was informed that the dr is the only one that can prescribe medication. Upon leaving pt shouter "FUCK YOU, SUCK A DICK"

## 2014-09-25 NOTE — Progress Notes (Signed)
Pt continues to be belligerent with staff

## 2014-09-25 NOTE — Progress Notes (Signed)
Pt continuing to yell, screaming at staff, cursing .

## 2014-09-25 NOTE — B[facility] Counselor (Signed)
Adult Psychosocial Assessment Update Interdisciplinary Team  [facility] admissions[facility]es:  Admissions Discharges  Date: 08/21/14 Date: 08/27/14  Date: Date:  Date: Date:  Date: Date:  Date: Date:   Changes since the last Psychosocial Assessment (including adherence to outpatient mental health and/or substance abuse treatment, situational issues contributing to decompensation and/or relapse).              Discharge Plan 1. Will you be returning to the same living situation after discharge?   Yes: No:      If no, what is your plan?    Summary and Recommendations (to be completed by the evaluator):  She has been getting services at the [facility] since her d/c from [place]. She plans on returning to her apartment at d/c. She can benefit from crises stabilization, medication management, therapeutic milieu and referral for services.        2. Would you like a referral for services when you are discharged? Yes:     If yes, for what services?  No:              Summary and Recommendations (to be completed by the evaluator) [name] is a 39 YO [name] who was admitted to [facility] around the first of the year, and to [facility] in April, [facility] in late June, and now here again because she is reluctant to take any meds other than klonopin outside of the hospital. She initially denies stopping meds, but when pressed admits to non-complicance. She was open to getting an injection here in June, and received [place].  She followed up with [facility] ACT team following discharge, and was doing well for a week or two.However, she refused the second Invega Injection on the 27th of June, and agreed to take only half of her Tegretol dose as she was c/o "sedation."  Apparently the wheels came off earlier this week when she went to spend some time with her mother, and her mother IVC'd her for threatening to kill her and [name]'s brother.  She can  benefit from crises stabilization, medication management, therapeutic milieu and coordination with the ACT team.                       Signature:  Ida Rogue, 09/25/2014 8:18 AM

## 2014-09-25 NOTE — [facility] Suicide Risk Assessment (Addendum)
[facility] Admission Suicide Risk Assessment   Nursing information obtained from:    Demographic factors:   39 year old single female, on disability Current Mental Status:   see below Loss Factors:   chronic mental illness  Historical Factors:   History of bipolar disorder  Risk Reduction Factors:   resilience  Total Time spent with patient: 45 minutes Principal Problem:  Bipolar Disorder I - Manic  Diagnosis:   Patient Active Problem List   Diagnosis Date Noted  . Bipolar affective disorder, currently manic, severe, with psychotic features [F31.2] 09/24/2014  . Homicidal ideation [R45.850]   . Bipolar disorder, current episode manic w/o psychotic features, severe [F31.13] 08/21/2014  . Cannabis use disorder, moderate, dependence [F12.20] 08/21/2014  . Tobacco use disorder [Z72.0] 08/21/2014     Continued Clinical Symptoms:  Alcohol Use Disorder Identification Test Final Score (AUDIT): 2 The "Alcohol Use Disorders Identification Test", Guidelines for Use in Primary Care, Second Edition.  World Science writer [facility]). Score between 0-7:  no or low risk or alcohol related problems. Score between 8-15:  moderate risk of alcohol related problems. Score between 16-19:  high risk of alcohol related problems. Score 20 or above:  warrants further diagnostic evaluation for alcohol dependence and treatment.   CLINICAL FACTORS:  Patient is a 39 year old single woman, known to our service from prior admission. She was recently admitted due to mania from 6/15- 08/27/14. At the time she was stabilized with Gean [place] IM injection ( next dose was reportedly due on our around 6/27) and on Tegretol. Patient has been medication non compliant and states she refuses to take Tegretol because " it makes me dizzy and a fall risk". Patient was brought in to ED by family, very irritable, angry, loud, foul language, reporting homicidal ideations towards family members. She presents disorganized, pressured in  speech, hypersexual, inappropriately flirting and insulting staff. She has limited insight , and states " I am fine ". Constantly uses foul language and often yells .  Report is that her sleep has been poor . Of note, denies medical illnesses , also denies any drug or alcohol abuse A TSH done in June was WNL.   Dx- Bipolar Disorder Manic   Plan - Patient, as above , states she is not willing to  Take Tegretol but will continue to offer it to her as it was reportedly well tolerated and effective during recent admission.  She is agreeing to Seroquel, which she states she has been on in the past. Will also use standing and PRN  Klonopin to address agitation and mania. Obtain  Lipid panel and HgbA1C    Musculoskeletal: Strength & Muscle Tone: within normal limits Gait & Station: normal Patient leans: N/A  Psychiatric Specialty Exam: Physical Exam  ROS  Blood pressure 129/65, pulse 117, temperature 98.9 F (37.2 C), temperature source Oral, resp. rate 20, height 5\' 3"  (1.6 m), weight 199 lb (90.266 kg), last menstrual period 09/23/2014.Body mass index is 35.26 kg/(m^2).  General Appearance: Fairly Groomed  Patent attorney::  Good  Speech:  Pressured  Volume:  loud   Mood:  Irritable  Affect:  expansive / irritable , angry   Thought Process:  flight of ideations   Orientation:  Full (Time, Place, and Person)  Thought Content:  grandiose ideations, denies hallucinations  Suicidal Thoughts:  No  Homicidal Thoughts:  today denies  any HI, makes statements such as " shut up b...., or I'll shut you up "   Memory:  recent and remote fair   Judgement:  Poor  Insight:  Lacking  Psychomotor Activity:  Increased  Concentration:  Poor  Recall:  Good  Fund of Knowledge:Good  Language: Good  Akathisia:  Negative  Handed:  Right  AIMS (if indicated):     Assets:  Desire for Improvement Resilience  Sleep:  Number of Hours: 5  Cognition: WNL  ADL's:  Impaired     COGNITIVE FEATURES THAT  CONTRIBUTE TO RISK:  Loss of executive function    SUICIDE RISK:   Moderate:  Frequent suicidal ideation with limited intensity, and duration, some specificity in terms of plans, no associated intent, good self-control, limited dysphoria/symptomatology, some risk factors present, and identifiable protective factors, including available and accessible social support.  PLAN OF CARE: Patient will be admitted to inpatient psychiatric unit for stabilization and safety. Will provide and encourage milieu participation. Provide medication management and maked adjustments as needed.  Will follow daily.    Medical Decision Making:  Review of Psycho-Social Stressors (1), Review or order clinical lab tests (1), Established Problem, Worsening (2), Review of Medication Regimen & Side Effects (2) and Review of New Medication or Change in Dosage (2)  I certify that inpatient services furnished can reasonably be expected to improve the patient's condition.   Sakia Schrimpf 09/25/2014, 2:39 PM   Addendum at 4,15 PM- discussed with staff- patient remains very loud , agitated, intrusive. Need for acute  PRN medications for agitation discussed. Patient refusing Tegretol, and took a low dose of Seroquel without any appreciable change. An EKG from June is unremarkable. We discussed options- D/C klonopin, D/C Seroquel, start standing Ativan, start PRN Ativan for agitation, start standing Haldol, start Haldol PRNs for agitation. Will ask colleague , Dr. Dub Mikes , to obtain second opinion for medication over objection. Continue 1 - 1 observation for safety.  Nehemiah Massed, MD

## 2014-09-25 NOTE — H&P (Signed)
Psychiatric Admission Assessment Adult  Patient Identification: [name]  MRN:  [id number]  Date of Evaluation:  09/25/2014  Chief Complaint:  Bipolar 1 affective disorder.  Principal Diagnosis: Bipolar affective disorder, currently manic, severe, with psychotic features  Diagnosis:   Patient Active Problem List   Diagnosis Date Noted  . Bipolar affective disorder, currently manic, severe, with psychotic features [F31.2] 09/24/2014  . Homicidal ideation [R45.850]   . Bipolar disorder, current episode manic w/o psychotic features, severe [F31.13] 08/21/2014  . Cannabis use disorder, moderate, dependence [F12.20] 08/21/2014  . Tobacco use disorder [Z72.0] 08/21/2014   History of Present Illness: [name] is a 39 year old [name] with Hx of bipolar 1 disorder. She was discharged from this hospital on; 08-27-14 after mood stabilization treatments. She was to follow-up care with the [facility] for the management of her mental health illness. [name] is admitted to [facility] adult unit from the [facility] with complaints of homicidal ideation towards her mother & brother. During this admission assessment, [name] reports, "I was involuntarily committed to the [facility] 2 weeks ago by my family. They are still pulling this stuff on me. I don't know why I was being committed to the hospital. I have an Act team that is managing my medicines. And if this is so, why do I have to be in this hospital? I'm really stressed out today. I was in my room, chilling, then this happened. Hey, you heifer, get out of there. Do not come near me. I don't need you hovering over me, leave, p..lease. I like the [facility], I got them, so you can't help me here".  Elements:  Location:  Bipolar affective disorder, currently manic, severe, with psychotic features. Quality:  Verbal aggression, agitation, restlessness, talkative, mood liability. Severity:  Severe. Timing:   Current & ongoing. Duration:  Chronic & current episodes. Context:  Worsening symptoms of bipolar disorder.  Associated Signs/Symptoms:  Depression Symptoms:  psychomotor agitation, difficulty concentrating, anxiety,  (Hypo) Manic Symptoms:  Delusions, Distractibility, Elevated Mood, Impulsivity, Irritable Mood, Labiality of Mood,  Anxiety Symptoms:  Agitation, restlessness  Psychotic Symptoms:  Delusions, Paranoia,  PTSD Symptoms: Denies  Total Time spent with patient: 1 hour  Past Medical History:  Past Medical History  Diagnosis Date  . Bipolar affective   . OCD (obsessive compulsive disorder)   . Anxiety    History reviewed. No pertinent past surgical history. Family History:  Family History  Problem Relation Age of Onset  . Drug abuse Father   . Mental illness Other    Social History:  History  Alcohol Use  . Yes    Comment: socially     History  Drug Use  . Yes  . Special: Marijuana    Comment: occasionally    History   Social History  . Marital Status: Single    Spouse Name: N/A  . Number of Children: N/A  . Years of Education: N/A   Social History Main Topics  . Smoking status: Current Every Day Smoker -- 1.00 packs/day for 1 years    Types: Cigarettes  . Smokeless tobacco: Never Used  . Alcohol Use: Yes     Comment: socially  . Drug Use: Yes    Special: Marijuana     Comment: occasionally  . Sexual Activity: No   Other Topics Concern  . None   Social History Narrative   Additional Social History:  Musculoskeletal: Strength & Muscle Tone: within normal  limits Gait & Station: normal Patient leans: N/A  Psychiatric Specialty Exam: Physical Exam  Constitutional: She appears well-developed.  HENT:  Head: Normocephalic.  Neck: Normal range of motion.  Cardiovascular: Normal rate.   Respiratory: Effort normal.  Genitourinary:  Denies any issues in this area   Musculoskeletal: Normal range of motion.  Neurological: She is  alert.  Psychiatric: Her mood appears anxious. Her affect is angry, blunt, labile and inappropriate. Her speech is rapid and/or pressured and tangential. She is agitated, aggressive (Verbally) and hyperactive. Thought content is paranoid and delusional. Cognition and memory are impaired. She expresses impulsivity and inappropriate judgment. She does not exhibit a depressed mood.    Review of Systems  Constitutional: Negative.   HENT: Negative.   Eyes: Negative.   Respiratory: Negative.   Cardiovascular: Negative.   Gastrointestinal: Negative.   Genitourinary: Negative.   Musculoskeletal: Negative.   Skin: Negative.   Neurological: Negative.   Endo/Heme/Allergies: Negative.   Psychiatric/Behavioral: Positive for memory loss and substance abuse (Cannabis abuse). Negative for depression, suicidal ideas and hallucinations. The patient is nervous/anxious and has insomnia.     Blood pressure 129/65, pulse 117, temperature 98.9 F (37.2 C), temperature source Oral, resp. rate 20, height _0  (1.6 m), weight 90.266 kg (199 lb), last menstrual period 09/23/2014.Body mass index is 35.26 kg/(m^2).  General Appearance: Casual and agitated, verbally aggressive  Eye Contact::  Fair  Speech:  Clear and Coherent, Pressured and loud  Volume:  Increased  Mood:  Angry, Irritable and agitated  Affect:  Labile  Thought Process:  Coherent  Orientation:  Full (Time, Place, and Person)  Thought Content:  Delusions, Obsessions and Rumination  Suicidal Thoughts:  Denies  Homicidal Thoughts:  "Yes, I wanna kill my mama"  Memory:  Immediate;   Good Recent;   Good Remote;   Fair  Judgement:  Impaired  Insight:  Lacking  Psychomotor Activity:  Increased, Restlessness and agitated  Concentration:  Poor  Recall:  [employer] of Knowledge:Poor  Language: Fair  Akathisia:  No  Handed:  Right  AIMS (if indicated):     Assets:  Communication Skills Desire for Improvement  ADL's:  Intact  Cognition: Fairly,  intact  Sleep:  Number of Hours: 5   Risk to Self: Is patient at risk for suicide?: No Risk to Others: Yes  Prior Inpatient Therapy: Yes Prior Outpatient Therapy: Yes  Alcohol Screening: 1. How often do you have a drink containing alcohol?: 2 to 4 times a month 2. How many drinks containing alcohol do you have on a typical day when you are drinking?: 1 or 2 3. How often do you have six or more drinks on one occasion?: Never Preliminary Score: 0 9. Have you or someone else been injured as a result of your drinking?: No 10. Has a relative or friend or a doctor or another health worker been concerned about your drinking or suggested you cut down?: No Alcohol Use Disorder Identification Test Final Score (AUDIT): 2 Brief Intervention: Patient declined brief intervention  Allergies:   Allergies  Allergen Reactions  . Depakote [Divalproex Sodium] Other (See Comments)    "Knocks me out." "knocked out"   Lab Results:  Results for orders placed or performed during the hospital encounter of 09/23/14 (from the past 48 hour(s))  Comprehensive metabolic panel     Status: Abnormal   Collection Time: 09/23/14 10:45 PM  Result Value Ref Range   Sodium 135 135 - 145 mmol/L   Potassium  3.5 3.5 - 5.1 mmol/L   Chloride 109 101 - 111 mmol/L   CO2 21 (L) 22 - 32 mmol/L   Glucose, Bld 144 (H) 65 - 99 mg/dL   BUN 11 6 - 20 mg/dL   Creatinine, Ser 1.21 (H) 0.44 - 1.00 mg/dL   Calcium 8.9 8.9 - 10.3 mg/dL   Total Protein 9.3 (H) 6.5 - 8.1 g/dL   Albumin 4.6 3.5 - 5.0 g/dL   AST 22 15 - 41 U/L   ALT 16 14 - 54 U/L   Alkaline Phosphatase 59 38 - 126 U/L   Total Bilirubin 0.7 0.3 - 1.2 mg/dL   GFR calc non Af Amer 57 (L) >60 mL/min   GFR calc Af Amer >60 >60 mL/min    Comment: (NOTE) The eGFR has been calculated using the CKD EPI equation. This calculation has not been validated in all clinical situations. eGFR's persistently <60 mL/min signify possible Chronic Kidney Disease.    Anion gap 5 5 -  15  Ethanol (ETOH)     Status: None   Collection Time: 09/23/14 10:45 PM  Result Value Ref Range   Alcohol, Ethyl (B) <5 <5 mg/dL    Comment:        LOWEST DETECTABLE LIMIT FOR SERUM ALCOHOL IS 5 mg/dL FOR MEDICAL PURPOSES ONLY   Salicylate level     Status: None   Collection Time: 09/23/14 10:45 PM  Result Value Ref Range   Salicylate Lvl <4.6 2.8 - 30.0 mg/dL  Acetaminophen level     Status: Abnormal   Collection Time: 09/23/14 10:45 PM  Result Value Ref Range   Acetaminophen (Tylenol), Serum <10 (L) 10 - 30 ug/mL    Comment:        THERAPEUTIC CONCENTRATIONS VARY SIGNIFICANTLY. A RANGE OF 10-30 ug/mL MAY BE AN EFFECTIVE CONCENTRATION FOR MANY PATIENTS. HOWEVER, SOME ARE BEST TREATED AT CONCENTRATIONS OUTSIDE THIS RANGE. ACETAMINOPHEN CONCENTRATIONS >150 ug/mL AT 4 HOURS AFTER INGESTION AND >50 ug/mL AT 12 HOURS AFTER INGESTION ARE OFTEN ASSOCIATED WITH TOXIC REACTIONS.   CBC     Status: Abnormal   Collection Time: 09/23/14 10:45 PM  Result Value Ref Range   WBC 8.3 4.0 - 10.5 K/uL   RBC 4.24 3.87 - 5.11 MIL/uL   Hemoglobin 11.0 (L) 12.0 - 15.0 g/dL   HCT 34.7 (L) 36.0 - 46.0 %   MCV 81.8 78.0 - 100.0 fL   MCH 25.9 (L) 26.0 - 34.0 pg   MCHC 31.7 30.0 - 36.0 g/dL   RDW 16.3 (H) 11.5 - 15.5 %   Platelets 347 150 - 400 K/uL  I-Stat beta hCG blood, ED ([facility], [facility], [facility] only)     Status: None   Collection Time: 09/23/14 10:50 PM  Result Value Ref Range   I-stat hCG, quantitative <5.0 <5 mIU/mL   Comment 3            Comment:   GEST. AGE      CONC.  (mIU/mL)   <=1 WEEK        5 - 50     2 WEEKS       50 - 500     3 WEEKS       100 - 10,000     4 WEEKS     1,000 - 30,000        FEMALE AND NON-PREGNANT FEMALE:     LESS THAN 5 mIU/mL    Current Medications: Current Facility-Administered Medications  Medication Dose Route  Frequency Provider Last Rate Last Dose  . acetaminophen (TYLENOL) tablet 650 mg  650 mg Oral Q6H PRN Laverle Hobby, PA-C      . alum & mag  hydroxide-simeth (MAALOX/MYLANTA) 200-200-20 MG/5ML suspension 30 mL  30 mL Oral Q4H PRN Laverle Hobby, PA-C      . benztropine (COGENTIN) tablet 0.5 mg  0.5 mg Oral QPM Laverle Hobby, PA-C   0.5 mg at 09/24/14 2221  . carbamazepine (TEGRETOL XR) 12 hr tablet 100 mg  100 mg Oral BID Laverle Hobby, PA-C   100 mg at 09/24/14 2221  . clonazePAM (KLONOPIN) tablet 2 mg  2 mg Oral TID PRN Laverle Hobby, PA-C   2 mg at 09/25/14 0758  . hydrOXYzine (ATARAX/VISTARIL) tablet 25 mg  25 mg Oral Q4H PRN Laverle Hobby, PA-C   25 mg at 09/25/14 0400  . magnesium hydroxide (MILK OF MAGNESIA) suspension 30 mL  30 mL Oral Daily PRN Laverle Hobby, PA-C      . traZODone (DESYREL) tablet 50 mg  50 mg Oral QHS,MR X 1 Laverle Hobby, PA-C   50 mg at 09/24/14 2321   PTA Medications: Prescriptions prior to admission  Medication Sig Dispense Refill Last Dose  . clonazePAM (KLONOPIN) 1 MG tablet Take 4 mg by mouth at bedtime.  0 09/23/2014 at Unknown time  . benztropine (COGENTIN) 0.5 MG tablet Take 1 tablet by mouth every evening. At 8pm  0 Unknown at Unknown time  . carbamazepine (TEGRETOL XR) 100 MG 12 hr tablet Take 1 tablet (100 mg total) by mouth 2 (two) times daily. 60 tablet 0 Unknown at Unknown time  . carbamazepine (TEGRETOL) 200 MG tablet Take 400 mg by mouth at bedtime.  0 Unknown at Unknown time  . hydrOXYzine (ATARAX/VISTARIL) 25 MG tablet Take 1 tablet (25 mg total) by mouth every 4 (four) hours as needed for anxiety. 30 tablet 0 Unknown at Unknown time  . [START ON 09/29/2014] paliperidone (INVEGA SUSTENNA) 156 MG/ML SUSP injection Inject 0.75 mLs (117 mg total) into the muscle every 28 (twenty-eight) days. Please note the next dose that is due 09/29/2014 0.9 mL 0 Unknown at Unknown time  . paliperidone (INVEGA SUSTENNA) 156 MG/ML SUSP injection Inject 1 mL (156 mg total) into the muscle once. Next dose due on 09/01/2014. 0.9 mL 0 Unknown at Unknown time  . traZODone (DESYREL) 50 MG tablet Take 1-2  tablets by mouth at bedtime.  0 Unknown at Unknown time   Previous Psychotropic Medications: Yes   Substance Abuse History in the last 12 months:  No.   Consequences of Substance Abuse: Medical Consequences:  Liver damage, Possible death by overdose Legal Consequences:  Arrests, jail time, Loss of driving privilege. Family Consequences:  Family discord, divorce and or separation.   Results for orders placed or performed during the hospital encounter of 09/23/14 (from the past 72 hour(s))  Comprehensive metabolic panel     Status: Abnormal   Collection Time: 09/23/14 10:45 PM  Result Value Ref Range   Sodium 135 135 - 145 mmol/L   Potassium 3.5 3.5 - 5.1 mmol/L   Chloride 109 101 - 111 mmol/L   CO2 21 (L) 22 - 32 mmol/L   Glucose, Bld 144 (H) 65 - 99 mg/dL   BUN 11 6 - 20 mg/dL   Creatinine, Ser 1.21 (H) 0.44 - 1.00 mg/dL   Calcium 8.9 8.9 - 10.3 mg/dL   Total Protein 9.3 (H) 6.5 - 8.1  g/dL   Albumin 4.6 3.5 - 5.0 g/dL   AST 22 15 - 41 U/L   ALT 16 14 - 54 U/L   Alkaline Phosphatase 59 38 - 126 U/L   Total Bilirubin 0.7 0.3 - 1.2 mg/dL   GFR calc non Af Amer 57 (L) >60 mL/min   GFR calc Af Amer >60 >60 mL/min    Comment: (NOTE) The eGFR has been calculated using the CKD EPI equation. This calculation has not been validated in all clinical situations. eGFR's persistently <60 mL/min signify possible Chronic Kidney Disease.    Anion gap 5 5 - 15  Ethanol (ETOH)     Status: None   Collection Time: 09/23/14 10:45 PM  Result Value Ref Range   Alcohol, Ethyl (B) <5 <5 mg/dL    Comment:        LOWEST DETECTABLE LIMIT FOR SERUM ALCOHOL IS 5 mg/dL FOR MEDICAL PURPOSES ONLY   Salicylate level     Status: None   Collection Time: 09/23/14 10:45 PM  Result Value Ref Range   Salicylate Lvl <3.2 2.8 - 30.0 mg/dL  Acetaminophen level     Status: Abnormal   Collection Time: 09/23/14 10:45 PM  Result Value Ref Range   Acetaminophen (Tylenol), Serum <10 (L) 10 - 30 ug/mL     Comment:        THERAPEUTIC CONCENTRATIONS VARY SIGNIFICANTLY. A RANGE OF 10-30 ug/mL MAY BE AN EFFECTIVE CONCENTRATION FOR MANY PATIENTS. HOWEVER, SOME ARE BEST TREATED AT CONCENTRATIONS OUTSIDE THIS RANGE. ACETAMINOPHEN CONCENTRATIONS >150 ug/mL AT 4 HOURS AFTER INGESTION AND >50 ug/mL AT 12 HOURS AFTER INGESTION ARE OFTEN ASSOCIATED WITH TOXIC REACTIONS.   CBC     Status: Abnormal   Collection Time: 09/23/14 10:45 PM  Result Value Ref Range   WBC 8.3 4.0 - 10.5 K/uL   RBC 4.24 3.87 - 5.11 MIL/uL   Hemoglobin 11.0 (L) 12.0 - 15.0 g/dL   HCT 34.7 (L) 36.0 - 46.0 %   MCV 81.8 78.0 - 100.0 fL   MCH 25.9 (L) 26.0 - 34.0 pg   MCHC 31.7 30.0 - 36.0 g/dL   RDW 16.3 (H) 11.5 - 15.5 %   Platelets 347 150 - 400 K/uL  I-Stat beta hCG blood, ED ([facility], [facility], [facility] only)     Status: None   Collection Time: 09/23/14 10:50 PM  Result Value Ref Range   I-stat hCG, quantitative <5.0 <5 mIU/mL   Comment 3            Comment:   GEST. AGE      CONC.  (mIU/mL)   <=1 WEEK        5 - 50     2 WEEKS       50 - 500     3 WEEKS       100 - 10,000     4 WEEKS     1,000 - 30,000        FEMALE AND NON-PREGNANT FEMALE:     LESS THAN 5 mIU/mL     Observation Level/Precautions:  Continuous Observation 1 to 1  Laboratory:  Per ED  Psychotherapy: group sessions  Medications: Seroquel 50 mg bid for agitation, Seroquel 200 mg for mood control   Consultations: As needed   Discharge Concerns: Safety, mood stabilization   Estimated LOS: 5-7 days  Other:     Psychological Evaluations: Yes   Treatment Plan Summary: Daily contact with patient to assess and evaluate symptoms and progress in  treatment and Medication management: Treatment Plan/Recommendations: 1. Admit for crisis management and stabilization, estimated length of stay 3-5 days.  2. Medication management to reduce current symptoms to base line and improve the patient's overall level of functioning; initiate Seroquel 50 mg bid for agitation, 100 mg  Q hs for mood control.  3. Treat health problems as indicated.  4. Develop treatment plan to decrease risk of relapse upon discharge and the need for readmission.  5. Psycho-social education regarding relapse prevention and self care.  6. Health care follow up as needed for medical problems.  7. Review, reconcile, and reinstate any pertinent home medications for other health issues where appropriate. 8. Call for consults with hospitalist for any additional specialty patient care services as needed.  Medical Decision Making:  New problem, with additional work up planned, Review of Psycho-Social Stressors (1), Review or order clinical lab tests (1), Review and summation of old records (2), Review of Medication Regimen & Side Effects (2) and Review of New Medication or Change in Dosage (2)  I certify that inpatient services furnished can reasonably be expected to improve the patient's condition.   Encarnacion Slates, PMHNP, FNP-BC 7/21/201610:24 AM  Case reviewed with NP and patient seen by me Agree with NP Note, Assessment Patient is a 39 year old single woman, known to our service from prior admission. She was recently admitted due to mania from 6/15- 08/27/14. At the time she was stabilized with [facility] IM injection ( next dose was reportedly due on our around 6/27) and on Tegretol. Patient has been medication non compliant and states she refuses to take Tegretol because " it makes me dizzy and a fall risk". Patient was brought in to ED by family, very irritable, angry, loud, foul language, reporting homicidal ideations towards family members. She presents disorganized, pressured in speech, hypersexual, inappropriately flirting and insulting staff. She has limited insight , and states " I am fine ". Constantly uses foul language and often yells . Report is that her sleep has been poor . Of note, denies medical illnesses , also denies any drug or alcohol abuse A TSH done in June was WNL.    Dx- Bipolar Disorder Manic   Plan - Patient, as above , states she is not willing to Take Tegretol but will continue to offer it to her as it was reportedly well tolerated and effective during recent admission. She is agreeing to Seroquel, which she states she has been on in the past. Will also use standing and PRN Klonopin to address agitation and mania. Obtain Lipid panel and HgbA1C

## 2014-09-25 NOTE — Progress Notes (Signed)
Nursing 1:1 note  Pt  wakes up periodically through the night cursing yelling verbally threatening staff.  D:Pt observed sleeping in bed with eyes closed. RR even and unlabored. No distress noted.  A: 1:1 observation continues for safety  R: pt remains safe

## 2014-09-25 NOTE — Progress Notes (Signed)
Pt very manipulative, disrespectful and has no sense of respect for other people. Pt actions appears to be all behavior. Pt continues to state" I can do and say whatever I want, you can't do anything to me"

## 2014-09-25 NOTE — [facility] Group Notes (Signed)
[facility] Group Notes:  (Counselor/Nursing/MHT/Case Management/Adjunct)  09/25/2014 1:15PM  Type of Therapy:  Group Therapy  Participation Level:  Pt has been loud, intrusive and refusing meds.  She was dis-invited from group.  Summary of Progress/Problems: The topic for group was balance in life.  Pt participated in the discussion about when their life was in balance and out of balance and how this feels.  Pt discussed ways to get back in balance and short term goals they can work on to get where they want to be.    Ida Rogue 09/25/2014 3:17 PM

## 2014-09-26 ENCOUNTER — Other Ambulatory Visit: Payer: Self-pay

## 2014-09-26 LAB — GLUCOSE, CAPILLARY: Glucose-Capillary: 162 mg/dL — ABNORMAL HIGH (ref 65–99)
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
## 2014-09-26 NOTE — Progress Notes (Signed)
[name], female   DOB: 1975-03-13, 39 y.o.   MRN: [id number]  D: Patient lying in bed sleeping on and off. Did scream out when other patient was yelling but went back to sleep. Still inappropriate when awake A: Continues to need monitoring 1:1 for safety due to mania R: Needing frequent redirection while awake.

## 2014-09-26 NOTE — Progress Notes (Signed)
[name], female   DOB: 1975/07/23, 39 y.o.   MRN: [id number]  D: Patient finally fell asleep between 10-11pm tonight after cursing and yelling since shift change. Continues to be in room snoring at this time. A: Staff will monitor on 1:1 due to mania for safety R: Will continue to redirect as needed

## 2014-09-26 NOTE — Progress Notes (Signed)
[name], female   DOB: 1975-10-01, 39 y.o.   MRN: [id number]  D: Continues to have some of the same behaviors as last night such as cursing staff and accusing staff of various things. She is still intrusive with other patients and still touches staff inappropriately at times. Taking medications but needs encouragement. Medications only having brief relief from symptoms. Patient tripped and fell on knees & buttocks. No injury. Fall precautions started. A: Staff will monitor on 1:1 for safety due to mania R: Needing constant redirection when awake

## 2014-09-26 NOTE — Progress Notes (Signed)
   09/26/14 2115  What Happened  Was fall witnessed? Yes  Who witnessed fall? Louanne Belton Technician)  Patients activity before fall ambulating-unassisted  Point of contact buttocks  Was patient injured? No  Follow Up  MD notified Jeanett Schlein)  Time MD notified 2200  Family notified No- patient refusal  Additional tests No  Progress note created (see row info) Yes  Adult Fall Risk Assessment  Risk Factor Category (scoring not indicated) Fall has occurred during this admission (document High fall risk)  Age 39  Fall History: Fall within 6 months prior to admission 0  Elimination; Bowel and/or Urine Incontinence 0  Elimination; Bowel and/or Urine Urgency/Frequency 0  Medications: includes PCA/Opiates, Anti-convulsants, Anti-hypertensives, Diuretics, Hypnotics, Laxatives, Sedatives, and Psychotropics 0  Patient Care Equipment 0  Mobility-Assistance 0  Mobility-Gait 0  Mobility-Sensory Deficit 0  Cognition-Awareness 0  Cognition-Impulsiveness 0  Cognition-Limitations 0  Total Score 0  Patient's Fall Risk High Fall Risk (>13 points)  Adult Fall Risk Interventions  Required Bundle Interventions *See Row Information* High fall risk - low, moderate, and high requirements implemented  Additional Interventions Fall risk signage;Other (Comment)  Fall with Injury Screening  Risk For Fall Injury- See Row Information  None identified  Intervention(s) for 2 or more risk criteria identified Safety Sitter  Vitals  Temp 98.7 F (37.1 C)  Temp Source Oral  BP 132/68 mmHg  BP Location Left Arm  BP Method Automatic  Patient Position (if appropriate) Sitting  Pulse Rate (!) 107  Pulse Rate Source Dinamap  Resp 18  Neurological  Neuro (WDL) WDL  R Extraocular Movement 4  L Extraocular Movement 4  Neuro Additional Assessments Yes

## 2014-09-26 NOTE — [facility] Group Notes (Signed)
[facility] LCSW Group Therapy     Type of Therapy:  Group Therapy  Participation Level:  Minimal  Participation Quality:  Intrusive  Affect:  Angry  Cognitive:  Lacking  Insight:  Lacking  Engagement in Therapy:  None  Modes of Intervention:  Discussion  Summary of Progress/Problems:  Finding Balance in Life. Today's group focused on defining balance in one's own words, identifying things that can knock one off balance, and exploring healthy ways to maintain balance in life. Group members were asked to provide an example of a time when they felt off balance, describe how they handled that situation, and process healthier ways to regain balance in the future. Group members were asked to share the most important tool for maintaining balance that they learned while at [facility] and how they plan to apply this method after discharge.  Patient came in and out of room, demanded attention for her concerns which were unrelated to topic under discussion.  Was gently redirected by peer due to use of foul language, stormed out of room as result.  Patient remains on 1:1 due to disruptive behaviors.  Santa Genera, LCSW Clinical Social Worker 09/26/2014 3:17 PM    Sallee Lange

## 2014-09-26 NOTE — Progress Notes (Signed)
[facility] MD Progress Note  09/26/2014 12:17 PM [name]  MRN:  [id number] Subjective:  Pt interviewed. Chart reviewed. Case discussed with unit staff. Pt is talkative, spoke of texting her mother about "Florencia Reasons". Pt reports feeling "great". Took haldol, no side effects reported. Sleep/appetite good. Pt feels the techs are laughing at her. Pt irritable at times, so not allowed to go to cafeteria for lunch (due to being belligerent). Pt denies SI/HI/AVH.   Principal Problem: Bipolar affective disorder, currently manic, severe, with psychotic features Diagnosis:   Patient Active Problem List   Diagnosis Date Noted  . Bipolar affective disorder, currently manic, severe, with psychotic features [F31.2] 09/24/2014  . Homicidal ideation [R45.850]   . Bipolar disorder, current episode manic w/o psychotic features, severe [F31.13] 08/21/2014  . Cannabis use disorder, moderate, dependence [F12.20] 08/21/2014  . Tobacco use disorder [Z72.0] 08/21/2014   Total Time spent with patient: 30 minutes   Past Medical History:  Past Medical History  Diagnosis Date  . Bipolar affective   . OCD (obsessive compulsive disorder)   . Anxiety    History reviewed. No pertinent past surgical history. Family History:  Family History  Problem Relation Age of Onset  . Drug abuse Father   . Mental illness Other    Social History:  History  Alcohol Use  . Yes    Comment: socially     History  Drug Use  . Yes  . Special: Marijuana    Comment: occasionally    History   Social History  . Marital Status: Single    Spouse Name: N/A  . Number of Children: N/A  . Years of Education: N/A   Social History Main Topics  . Smoking status: Current Every Day Smoker -- 1.00 packs/day for 1 years    Types: Cigarettes  . Smokeless tobacco: Never Used  . Alcohol Use: Yes     Comment: socially  . Drug Use: Yes    Special: Marijuana     Comment: occasionally  . Sexual Activity: No   Other Topics  Concern  . None   Social History Narrative   Additional History:    Sleep: Poor  Appetite:  Good   Assessment:   Musculoskeletal: Strength & Muscle Tone: within normal limits Gait & Station: normal Patient leans: N/A   Psychiatric Specialty Exam: Physical Exam  ROS  Blood pressure 116/67, pulse 113, temperature 99 F (37.2 C), temperature source Oral, resp. rate 22, height 5\' 3"  (1.6 m), weight 90.266 kg (199 lb), last menstrual period 09/23/2014, SpO2 100 %.Body mass index is 35.26 kg/(m^2).  General Appearance: Bizarre, Disheveled and Guarded  Eye Contact::  Poor  Speech:  Pressured  Volume:  Increased  Mood:  Euphoric  Affect:  Labile  Thought Process:  Irrelevant and flight of ideas  Orientation:  Full (Time, Place, and Person)  Thought Content:  Paranoid Ideation and Rumination  Suicidal Thoughts:  No  Homicidal Thoughts:  No  Memory:  Immediate;   Fair Recent;   Fair Remote;   Fair  Judgement:  Poor  Insight:  Shallow  Psychomotor Activity:  Restlessness  Concentration:  Poor  Recall:  Poor  Fund of Knowledge:Poor  Language: Fair  Akathisia:  Negative  Handed:  Right  AIMS (if indicated):     Assets:  Communication Skills  ADL's:  Intact  Cognition: WNL  Sleep:  Number of Hours: 4.5     Current Medications: Current Facility-Administered Medications  Medication Dose Route Frequency Provider  Last Rate Last Dose  . acetaminophen (TYLENOL) tablet 650 mg  650 mg Oral Q6H PRN Kerry Hough, PA-C      . alum & mag hydroxide-simeth (MAALOX/MYLANTA) 200-200-20 MG/5ML suspension 30 mL  30 mL Oral Q4H PRN Kerry Hough, PA-C      . carbamazepine (TEGRETOL XR) 12 hr tablet 200 mg  200 mg Oral BID Rockey Situ Cobos, MD   200 mg at 09/26/14 0739  . haloperidol (HALDOL) tablet 5 mg  5 mg Oral Q8H PRN Craige Cotta, MD   5 mg at 09/26/14 7829   Or  . haloperidol lactate (HALDOL) injection 5 mg  5 mg Intramuscular Q8H PRN Rockey Situ Cobos, MD      .  haloperidol (HALDOL) tablet 5 mg  5 mg Oral BID Craige Cotta, MD   5 mg at 09/26/14 0739  . hydrOXYzine (ATARAX/VISTARIL) tablet 25 mg  25 mg Oral Q4H PRN Kerry Hough, PA-C   25 mg at 09/25/14 2041  . LORazepam (ATIVAN) tablet 1 mg  1 mg Oral Q6H PRN Craige Cotta, MD   1 mg at 09/26/14 5621   Or  . LORazepam (ATIVAN) injection 1 mg  1 mg Intramuscular Q6H PRN Craige Cotta, MD      . LORazepam (ATIVAN) tablet 1 mg  1 mg Oral BID Craige Cotta, MD   1 mg at 09/26/14 0739  . magnesium hydroxide (MILK OF MAGNESIA) suspension 30 mL  30 mL Oral Daily PRN Kerry Hough, PA-C      . traZODone (DESYREL) tablet 50 mg  50 mg Oral QHS,MR X 1 Kerry Hough, PA-C   50 mg at 09/25/14 2213    Lab Results:  Results for orders placed or performed during the hospital encounter of 09/24/14 (from the past 48 hour(s))  Glucose, capillary     Status: Abnormal   Collection Time: 09/26/14 11:51 AM  Result Value Ref Range   Glucose-Capillary 162 (H) 65 - 99 mg/dL    Physical Findings: AIMS:  , ,  ,  ,    CIWA:    COWS:     Treatment Plan Summary: Daily contact with patient to assess and evaluate symptoms and progress in treatment, Medication management and Plan pt is agreeable to taking haldol 5 mg bid. pt refuses to take tegretol, because she reports that it makes her dizzy and a "fall risk". continue 1:1 for safety.    Medical Decision Making:  Established Problem, Stable/Improving (1) and Review of Medication Regimen & Side Effects (2)     Lennix Kneisel 09/26/2014, 12:17 PM

## 2014-09-26 NOTE — Progress Notes (Addendum)
Pt continues to be loud and disruptive in the dayroom.She continues to curse the staff and other pts. Pt did take all her am meds stating,"if I take these mother f'er medicines then I can go home. " Pt remains a 1:1 . She remains very intrusive and sexually inappropriate. Pt calls the nurse,"you white cracker ." 9:15a-Pt was asked to leave group after she yelld out,"if I were home right now I would be chillin and smoking a blunt. This group is bullshit." That man don't care about none of [place] and he just talks down to everybody. He is just another white cracker."10a-Pt s/p EKG at the bedside. She then fell asleep times 30 minutes. 10:45a-pt stated,"I am slim shady now go on and get out of my way bitch."Pt stated,"I want to go on and get pregnant by a rich white man now you shut up."12noon-pt is cooperative now and is in the dayroom eating a grilled chicken salad and fruit. She ate 100% of her lunch. Pt remains a 1:1 for boundary issues. 2p-Pt remains a 1:1 and remains safe. Presently she is in the dayroom and is quiet. Her gait is steady. 6pm-pt ate 100% of her dinner. She does appear quietier and calmer. Pt remains a 1:1 and has been taking all her medications as ordered by the MD. Pt stated,"well once I get out of here I am not taking that tegretol and nobody can make me."

## 2014-09-27 NOTE — Progress Notes (Signed)
Did not attend group 

## 2014-09-27 NOTE — [facility] Group Notes (Signed)
[facility] Group Notes:  (Clinical Social Work)  09/27/2014  11:15-12:00PM  Summary of Progress/Problems:   The main focus of today's process group was to discuss patients' feelings related to being hospitalized, as well as the difference between "being" and "having" a mental health diagnosis.  It was agreed in general by the group that it would be preferable to avoid future hospitalizations, and we discussed means of doing that.  As a follow-up, problems with adhering to medication recommendations were discussed.  The patient expressed their primary feeling about being hospitalized is anger at her family for hospitalizing her.  She was more willing to accept redirection from clinician and other patients, who let her know when her behavior was not acceptable.  She eventually left the group and was not allowed to return by her 1:1, who realized her behavior of in and out of the room was disruptive.  Type of Therapy:  Group Therapy - Process  Participation Level:  Active  Participation Quality:  Intrusive, Monopolizing and Redirectable  Affect:  Blunted and Depressed  Cognitive:  Disorganized  Insight:  Limited  Engagement in Therapy:  Limited  Modes of Intervention:  Exploration, Discussion  Ambrose Mantle, LCSW 09/27/2014, 1:07 PM

## 2014-09-27 NOTE — Progress Notes (Signed)
[name], female   DOB: Apr 08, 1975, 39 y.o.   MRN: [id number]   NURSING 1:1 NOTE  D: Pt continues to be very intrusive on the unit, pt also continues to have very poor boundaries. Pt has required frequent redirect as she continues to invade other patients personal space, and continues to curse other patients out. Pt remains very labile, at times she attempts to get sleep other times she is pacing the floor cursing everyone out. Pt does not respond well to redirection, and becomes very belligerent. Pt reports being negative SI/HI, no AH/VH noted. A: 1:1 continued for patient safety. R: Pt safety maintained.

## 2014-09-27 NOTE — Progress Notes (Signed)
[name], female   DOB: [birth year], 39 y.o.   MRN: [id number]  D: Patient in bed with eyes closed. Snoring at this time. No distress noted A: Staff will monitor on 1:1 for safety due to mania R: Needing redirecting when awake

## 2014-09-27 NOTE — Progress Notes (Signed)
1:1 Note Pt has been in her room most of the shift resting in bed   She was loud at the very first of the shift but has so far been calm   Verbal support given   Pt on 1:1 for aggressive behaviors   Pt is presently safe

## 2014-09-27 NOTE — Progress Notes (Addendum)
[name], female   DOB: [birth year], 39 y.o.   MRN: [id number]  NURSING 1:1 NOTE  D: Pt continues to be very intrusive on the unit, pt also continues to have very poor boundaries. Pt has required frequent redirect as she continues to invade other patients personal space, and continues to curse other patients out. Pt is very labile, at times she attempts to get sleep other times she is pacing the floor cursing everyone out. Pt does not respond well to redirection, and becomes very belligerent. Pt reports being negative SI/HI, no AH/VH noted. A: 1:1 continued for patient safety. R: Pt safety maintained.

## 2014-09-27 NOTE — Progress Notes (Signed)
[name], female   DOB: 08-May-1975, 39 y.o.   MRN: [id number]  Refused to wear fall risk band. Continues to say that she was just playing around that it wasn't a real fall.

## 2014-09-27 NOTE — Progress Notes (Signed)
D: Patient got up yelling this am around 0400. Still saying negative things to staff and trying to touch staff's hair. Continues to be intrusive. Fighting against sleep during the night but was able to get adequate sleep. A: Staff will continue on 1:1 for safety due to mania R: Appears asleep at present

## 2014-09-27 NOTE — Progress Notes (Signed)
[name], female   DOB: 09/04/1975, 39 y.o.   MRN: [id number]   NURSING 1:1 NOTE  D: Pt in bed resting, no physical distress noted. Pt will respond to the calling of her name, no issues noted.  A: 1:1 continued for patient safety. R: Pt safety maintained.

## 2014-09-27 NOTE — Progress Notes (Addendum)
[name], female   DOB: [birth year], 39 y.o.   MRN: [id number] [facility] MD Progress Note  09/27/2014 12:17 PM [name]  MRN:  [id number] Subjective: "get out of here, why are you keeping me in the hospital, I don't like 1:1 I can take care of myself.''  Objective: Patient is seen, interviewed,  chart reviewed and case discussed with unit staff. Patient remains labile, manic, hyper verbal, grandiose, delusional, easily agitated, oppositional defiant, argumentative and could be physically aggressive towards the staff. Staff reports that patient has been extremely intrusive, verbally abusive and has refused to participate in the unit milieu. Patient has a very poor insight and exercising poor judgment. She is partially compliant to her current medication regimen and needs regular prompting. She denies HI/SI, AVH but her behavior has been essentially unpredictable.   Principal Problem: Bipolar affective disorder, currently manic, severe, with psychotic features Diagnosis:   Patient Active Problem List   Diagnosis Date Noted  . Bipolar affective disorder, currently manic, severe, with psychotic features [F31.2] 09/24/2014  . Homicidal ideation [R45.850]   . Bipolar disorder, current episode manic w/o psychotic features, severe [F31.13] 08/21/2014  . Cannabis use disorder, moderate, dependence [F12.20] 08/21/2014  . Tobacco use disorder [Z72.0] 08/21/2014   Total Time spent with patient: 30 minutes   Past Medical History:  Past Medical History  Diagnosis Date  . Bipolar affective   . OCD (obsessive compulsive disorder)   . Anxiety    History reviewed. No pertinent past surgical history. Family History:  Family History  Problem Relation Age of Onset  . Drug abuse Father   . Mental illness Other    Social History:  History  Alcohol Use  . Yes    Comment: socially     History  Drug Use  . Yes  . Special: Marijuana    Comment: occasionally    History   Social  History  . Marital Status: Single    Spouse Name: N/A  . Number of Children: N/A  . Years of Education: N/A   Social History Main Topics  . Smoking status: Current Every Day Smoker -- 1.00 packs/day for 1 years    Types: Cigarettes  . Smokeless tobacco: Never Used  . Alcohol Use: Yes     Comment: socially  . Drug Use: Yes    Special: Marijuana     Comment: occasionally  . Sexual Activity: No   Other Topics Concern  . None   Social History Narrative   Additional History:    Sleep: Poor  Appetite:  Good   Assessment:   Musculoskeletal: Strength & Muscle Tone: within normal limits Gait & Station: normal Patient leans: N/A   Psychiatric Specialty Exam: Physical Exam  ROS  Blood pressure 131/77, pulse 117, temperature 98.7 F (37.1 C), temperature source Oral, resp. rate 18, height  (1.6 m), weight 90.266 kg (199 lb), last menstrual period 09/23/2014, SpO2 100 %.Body mass index is 35.26 kg/(m^2).  General Appearance: Bizarre, Disheveled and Guarded  Eye Contact::  Poor  Speech:  Pressured  Volume:  Increased  Mood:  Euphoric  Affect:  Labile  Thought Process:  Irrelevant and flight of ideas  Orientation:  Full (Time, Place, and Person)  Thought Content:  Paranoid Ideation and Rumination  Suicidal Thoughts:  No  Homicidal Thoughts:  No  Memory:  Immediate;   Fair Recent;   Fair Remote;   Fair  Judgement:  Poor  Insight:  Shallow  Psychomotor Activity:  Restlessness  Concentration:  Poor  Recall:  Poor  Fund of Knowledge:Poor  Language: Fair  Akathisia:  Negative  Handed:  Right  AIMS (if indicated):     Assets:  Communication Skills  ADL's:  Intact  Cognition: WNL  Sleep:  Number of Hours: 5     Current Medications: Current Facility-Administered Medications  Medication Dose Route Frequency Provider Last Rate Last Dose  . acetaminophen (TYLENOL) tablet 650 mg  650 mg Oral Q6H PRN Kerry Hough, PA-C   650 mg at 09/27/14 0431  . alum & mag  hydroxide-simeth (MAALOX/MYLANTA) 200-200-20 MG/5ML suspension 30 mL  30 mL Oral Q4H PRN Kerry Hough, PA-C      . carbamazepine (TEGRETOL XR) 12 hr tablet 200 mg  200 mg Oral BID Rockey Situ Cobos, MD   200 mg at 09/27/14 8119  . haloperidol (HALDOL) tablet 5 mg  5 mg Oral Q8H PRN Craige Cotta, MD   5 mg at 09/27/14 1478   Or  . haloperidol lactate (HALDOL) injection 5 mg  5 mg Intramuscular Q8H PRN Rockey Situ Cobos, MD      . haloperidol (HALDOL) tablet 5 mg  5 mg Oral BID Craige Cotta, MD   5 mg at 09/27/14 2956  . hydrOXYzine (ATARAX/VISTARIL) tablet 25 mg  25 mg Oral Q4H PRN Kerry Hough, PA-C   25 mg at 09/27/14 1052  . LORazepam (ATIVAN) tablet 1 mg  1 mg Oral Q6H PRN Craige Cotta, MD   1 mg at 09/27/14 1052   Or  . LORazepam (ATIVAN) injection 1 mg  1 mg Intramuscular Q6H PRN Craige Cotta, MD      . LORazepam (ATIVAN) tablet 1 mg  1 mg Oral BID Craige Cotta, MD   1 mg at 09/27/14 0729  . magnesium hydroxide (MILK OF MAGNESIA) suspension 30 mL  30 mL Oral Daily PRN Kerry Hough, PA-C      . traZODone (DESYREL) tablet 50 mg  50 mg Oral QHS,MR X 1 Kerry Hough, PA-C   50 mg at 09/26/14 2205    Lab Results:  Results for orders placed or performed during the hospital encounter of 09/24/14 (from the past 48 hour(s))  Glucose, capillary     Status: Abnormal   Collection Time: 09/26/14 11:51 AM  Result Value Ref Range   Glucose-Capillary 162 (H) 65 - 99 mg/dL    Physical Findings: AIMS:  , ,  ,  ,    CIWA:    COWS:     Treatment Plan Summary: Assessment: Bipolar 1 disorder recent episode manic with psychotic features Plan: Daily contact and medication management -Continue Trazodone 50mg  Qhs for insomnia -Continue Ativan 1mg  bid for agitation -Continue Vistaril 25mg  Q4h prn for anxiety -Continue Haldol 5mg  bid for delusions -Continue Tegretol XR 200mg  bid for bipolar disorder -Continue 1:1 observation due to unpredictable behavior  Medical Decision  Making:  Review or order clinical lab tests (1), Established Problem, Worsening (2), Review of Medication Regimen & Side Effects (2) and Review of New Medication or Change in Dosage (2)   Thedore Mins, MD 09/27/2014, 12:17 PM

## 2014-09-28 MED ORDER — CARBAMAZEPINE ER 400 MG PO TB12
400.0000 mg | ORAL_TABLET | Freq: Two times a day (BID) | ORAL | Status: DC
  Administered 2014-09-28 – 2014-09-29 (×3): 400 mg via ORAL
  Filled 2014-09-28 (×6): qty 1
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
## 2014-09-28 NOTE — Progress Notes (Signed)
Pt was awake earlier and was irritable and talking very loud in the hallway   Calling staff stupid   She was redirected to her room with some difficulty and is presently in her room talking

## 2014-09-28 NOTE — [facility] Group Notes (Signed)
[facility] Group Notes:  (Clinical Social Work)  09/28/2014  [facility] Group Notes:  (Clinical Social Work)  09/28/2014  11:00AM-12:00PM  Summary of Progress/Problems:  The main focus of today's process group was to listen to a variety of genres of music and to identify that different types of music provoke different responses.  The patient then was able to identify personally what was soothing for them, as well as energizing.   The patient expressed no understanding of concepts presented, but did sing and/or dance to some of the music.  She made loud, inappropriate comments about another patient but was redirectable by her 1:1 MHT.  She was in and out of the room multiple times in a frequently disruptive manner.  Type of Therapy:  Music Therapy   Participation Level:  Active  Participation Quality:  Attentive and Inattentive, at various times  Affect:  Blunted  Cognitive:  Disorganized  Insight:  Limited  Engagement in Therapy:  Limited  Modes of Intervention:   Activity, Exploration  Ambrose Mantle, LCSW 09/28/2014

## 2014-09-28 NOTE — Progress Notes (Signed)
1:1 progress note   Pt remains labile and can be belligerent at times but her ability to receive redirection is improving   She continues to touch others inappropriately and has been counseled extensively on same by multiple staff persons   Pt mood and behavior are improving somewhat   Verbal support given  Medications administered and effectiveness monitored   1:1 sitter with patient   Pt remains safe at present

## 2014-09-28 NOTE — Progress Notes (Signed)
1:1 progress note Pt got up and was agitated she received ativan at that time   Her speech is slurred and she appears to sleep while she stands   She makes snoring noises  Pt is less agitated today than 2 days ago but is still very argumentative   Pt on 1:1 for aggressive unpredictable behaviors   She is presently safe

## 2014-09-28 NOTE — Progress Notes (Signed)
[name], female   DOB: [birth year], 39 y.o.   MRN: [id number]   NURSING 1:1 NOTE  D: Pt in bed resting, no physical distress noted. Pt will respond to the calling of her name. Pt was seen by Dr. Jannifer Franklin, orders were noted for 1:1 while awake. A: 15 min checks continued for safety. R: Pts safety maintained.

## 2014-09-28 NOTE — Progress Notes (Signed)
[name], female   DOB: 20-Jan-1976, 39 y.o.   MRN: [id number] [facility] MD Progress Note  09/28/2014 2:26 PM [name]  MRN:  [id number] Subjective: "are you the doctor? None of you all don't seem to know what you are doing. I can be my own doctor, get me out of here.''   Objective: Patient is seen, interviewed, chart reviewed and case discussed with unit staff. Her behavior remains out of control, unpredictable, she is labile, intrusive, verbally abusive, manic, hyper verbal, grandiose, delusional, easily agitated, oppositional defiant, argumentative and could be physically aggressive towards the staff. She is requesting to be discharged and has refused to participate in the unit milieu. Patient has a very poor insight and exercising poor judgment. She remains partially committed to her treatment. She denies HI/SI, AVH but her behavior has been essentially unpredictable.   Principal Problem: Bipolar affective disorder, currently manic, severe, with psychotic features Diagnosis:   Patient Active Problem List   Diagnosis Date Noted  . Bipolar affective disorder, currently manic, severe, with psychotic features [F31.2] 09/24/2014  . Homicidal ideation [R45.850]   . Bipolar disorder, current episode manic w/o psychotic features, severe [F31.13] 08/21/2014  . Cannabis use disorder, moderate, dependence [F12.20] 08/21/2014  . Tobacco use disorder [Z72.0] 08/21/2014   Total Time spent with patient: 30 minutes   Past Medical History:  Past Medical History  Diagnosis Date  . Bipolar affective   . OCD (obsessive compulsive disorder)   . Anxiety    History reviewed. No pertinent past surgical history. Family History:  Family History  Problem Relation Age of Onset  . Drug abuse Father   . Mental illness Other    Social History:  History  Alcohol Use  . Yes    Comment: socially     History  Drug Use  . Yes  . Special: Marijuana    Comment: occasionally    History    Social History  . Marital Status: Single    Spouse Name: N/A  . Number of Children: N/A  . Years of Education: N/A   Social History Main Topics  . Smoking status: Current Every Day Smoker -- 1.00 packs/day for 1 years    Types: Cigarettes  . Smokeless tobacco: Never Used  . Alcohol Use: Yes     Comment: socially  . Drug Use: Yes    Special: Marijuana     Comment: occasionally  . Sexual Activity: No   Other Topics Concern  . None   Social History Narrative   Additional History:    Sleep: Poor  Appetite:  Good   Assessment:   Musculoskeletal: Strength & Muscle Tone: within normal limits Gait & Station: normal Patient leans: N/A   Psychiatric Specialty Exam: Physical Exam  ROS  Blood pressure 129/76, pulse 109, temperature 97.7 F (36.5 C), temperature source Oral, resp. rate 20, height  (1.6 m), weight 90.266 kg (199 lb), last menstrual period 09/23/2014, SpO2 100 %.Body mass index is 35.26 kg/(m^2).  General Appearance: Bizarre, Disheveled and Guarded  Eye Contact::  Poor  Speech:  Pressured  Volume:  Increased  Mood:  Euphoric  Affect:  Labile  Thought Process:  Irrelevant and flight of ideas  Orientation:  Full (Time, Place, and Person)  Thought Content:  Paranoid Ideation and Rumination  Suicidal Thoughts:  No  Homicidal Thoughts:  No  Memory:  Immediate;   Fair Recent;   Fair Remote;   Fair  Judgement:  Poor  Insight:  Shallow  Psychomotor Activity:  Restlessness  Concentration:  Poor  Recall:  Poor  Fund of Knowledge:Poor  Language: Fair  Akathisia:  Negative  Handed:  Right  AIMS (if indicated):     Assets:  Communication Skills  ADL's:  Intact  Cognition: WNL  Sleep:  Number of Hours: 4.25     Current Medications: Current Facility-Administered Medications  Medication Dose Route Frequency Provider Last Rate Last Dose  . acetaminophen (TYLENOL) tablet 650 mg  650 mg Oral Q6H PRN Kerry Hough, PA-C   650 mg at 09/28/14 0205   . alum & mag hydroxide-simeth (MAALOX/MYLANTA) 200-200-20 MG/5ML suspension 30 mL  30 mL Oral Q4H PRN Kerry Hough, PA-C      . carbamazepine (TEGRETOL XR) 12 hr tablet 200 mg  200 mg Oral BID Rockey Situ Cobos, MD   200 mg at 09/28/14 0946  . haloperidol (HALDOL) tablet 5 mg  5 mg Oral Q8H PRN Craige Cotta, MD   5 mg at 09/27/14 1610   Or  . haloperidol lactate (HALDOL) injection 5 mg  5 mg Intramuscular Q8H PRN Rockey Situ Cobos, MD      . haloperidol (HALDOL) tablet 5 mg  5 mg Oral BID Craige Cotta, MD   5 mg at 09/28/14 0946  . hydrOXYzine (ATARAX/VISTARIL) tablet 25 mg  25 mg Oral Q4H PRN Kerry Hough, PA-C   25 mg at 09/27/14 1052  . LORazepam (ATIVAN) tablet 1 mg  1 mg Oral Q6H PRN Craige Cotta, MD   1 mg at 09/28/14 1256   Or  . LORazepam (ATIVAN) injection 1 mg  1 mg Intramuscular Q6H PRN Craige Cotta, MD      . LORazepam (ATIVAN) tablet 1 mg  1 mg Oral BID Craige Cotta, MD   1 mg at 09/28/14 0946  . magnesium hydroxide (MILK OF MAGNESIA) suspension 30 mL  30 mL Oral Daily PRN Kerry Hough, PA-C      . traZODone (DESYREL) tablet 50 mg  50 mg Oral QHS,MR X 1 Kerry Hough, PA-C   50 mg at 09/26/14 2205    Lab Results:  No results found for this or any previous visit (from the past 48 hour(s)).  Physical Findings: AIMS:  , ,  ,  ,    CIWA:    COWS:     Treatment Plan Summary: Assessment: Bipolar 1 disorder recent episode manic with psychotic features Plan: Daily contact and medication management -Continue Trazodone 50mg  Qhs for insomnia -Continue Ativan 1mg  bid for agitation -Continue Vistaril 25mg  Q4h prn for anxiety -Continue Haldol 5mg  bid for delusions -Increase Tegretol XR to 400mg  bid for bipolar disorder -Continue 1:1 observation due to unpredictable behavior  Medical Decision Making:  Review or order clinical lab tests (1), Established Problem, Worsening (2), Review of Medication Regimen & Side Effects (2) and Review of New Medication or  Change in Dosage (2)   Thedore Mins, MD 09/28/2014, 2:26 PM

## 2014-09-28 NOTE — Progress Notes (Signed)
[name], female   DOB: 1975/07/01, 39 y.o.   MRN: [id number]   NURSING 1:1 NOTE  D: Pt continues to be very intrusive on the unit. Pt continues to have very poor boundaries. Pt has done a better job at accepting feedback from staff, and has been more receptive today. Pt continues to request to be seen by the doctor so that she may come off the 1:1, pt advised that she will remain on the 1:1. Pt was also made aware that she would potentially be a 1:1 while awake. Pt reported that she was negative SI/HI, no AH/VH noted. A: 1:1 continued for patient safety. R: Pts safety maintained.

## 2014-09-28 NOTE — Progress Notes (Signed)
Adult Psychoeducational Group Note  Date:  09/28/2014 Time:  9:33 PM  Group Topic/Focus:  Wrap-Up Group:   The focus of this group is to help patients review their daily goal of treatment and discuss progress on daily workbooks.  Participation Level:  Active  Participation Quality:  Appropriate  Affect:  Appropriate  Cognitive:  Appropriate  Insight: Appropriate  Engagement in Group:  Engaged  Modes of Intervention:  Discussion  Additional Comments: The patient attend group.  Octavio Manns 09/28/2014, 9:33 PM

## 2014-09-28 NOTE — Progress Notes (Signed)
[name], female   DOB: Sep 02, 1975, 39 y.o.   MRN: [id number]   Pt was seen by Dr. Jannifer Franklin, orders noted for 1:1 to be while awake.

## 2014-09-28 NOTE — Progress Notes (Signed)
[name], female   DOB: 03-17-1975, 39 y.o.   MRN: [id number]   D: Pt in bed resting, no physical distress noted. Pt will respond to the calling of her name. Pt was seen by Dr. Jannifer Franklin, orders were noted for 1:1 while awake. Pt reported on her self inventory sheet that her depression was a 10, her hopelessness was a 10, and his anxiety was a 10. Pt reported that her goal was to work on going home. A: 15 min checks continued for safety. R: Pts safety maintained.

## 2014-09-28 NOTE — Progress Notes (Signed)
1:1 note   Pt has been awake off and on   When she is awake she continues to be belligerent and loud but is more easily redirected    Pt is on 1:1 for aggressive verbalizations and behaviors    She is presently safe

## 2014-09-29 MED ORDER — BENZTROPINE MESYLATE 1 MG PO TABS
1.0000 mg | ORAL_TABLET | Freq: Two times a day (BID) | ORAL | Status: DC
  Administered 2014-09-29 – 2014-10-02 (×6): 1 mg via ORAL
  Filled 2014-09-29 (×9): qty 1

## 2014-09-29 MED ORDER — CARBAMAZEPINE ER 200 MG PO TB12
200.0000 mg | ORAL_TABLET | Freq: Two times a day (BID) | ORAL | Status: DC
  Administered 2014-09-30 – 2014-10-02 (×5): 200 mg via ORAL
  Filled 2014-09-29 (×7): qty 1

## 2014-09-29 NOTE — Progress Notes (Signed)
No change from previous assessment   Pt has been in bed resting but she has a tendency to jump up abruptly from her bed and then staggers around    She can be in a sound sleep snoring  Then just jump up out of bed and run out of her door   She is very impulsive and unpredictable   She continues to be irritable and belligerant at time and calls staff dumb asses who dont know shit   She has been on a 1:1 for previous mentioned behaviors   She remains safe at present

## 2014-09-29 NOTE — Progress Notes (Signed)
D: Patient is alert and oriented. Pt's mood and affect is anxious, angry at times, and irritable. Pt's speech is disorganized, pressured, argumentative, defensive, demanding, and slurred at times. Pt denies SI/HI and AVH. Pt experiencing tachycardia today. Pt reports her goal for the day is "getting out" by "talk to the doctor." Pt rates depression and hopelessness both 9/10. Pt shouts at RN this morning, cursing stating "I don't take damn tegretol, it makes me fall! I'll take it here but not when I get out!" Per MHT Hana, pt is unsteady on her feet at times. Pt refuses to wear gait belt, states "I told you I can walk, I ain't going to fall!" Pt is attending some unit groups today. A: Active listening by RN. Encouragement/Support provided to pt. Pt high fall risk per nursing judgment. Fall precautions/prevention reviewed with pt, pt offered gait belt, RN showed gait belt to pt and reviewed it's purpose, discussed with MHT Hana to remain within arms reach of pt. Pt remains on 1:1 status per providers orders, MHT with pt at all times, see additional 1:1 notes at 1000, 1400, and 1800. EKG completed, MD Cobos made aware and EKG print out placed on pt's chart. Will monitor/reassess pulse frequently. Medication education reviewed with pt. Scheduled medications administered per providers orders (See MAR). 15 minute checks continued per protocol for patient safety.  R: Patient needs frequent redirection. Pt tolerated EKG procedure with ease. Pt remains safe.

## 2014-09-29 NOTE — Progress Notes (Signed)
1:1 NOTE: D: Patient is observed lying in bed with eyes closed. Pt appears to be asleep. Pt does not appear to be in any acute distress. Pt waken for RN to completed EKG. A: Active listening by RN. EKG procedure explained to pt and completed. Pt remains on 1:1 status per providers orders, MHT with pt at all times. 15 minute checks continued per protocol for patient safety.  R: Pt tolerated EKG procedure with ease. Pt remains safe.

## 2014-09-29 NOTE — Progress Notes (Signed)
[facility] Group Notes:  (Nursing/MHT/Case Management/Adjunct)  Date:  09/29/2014  Time:  8:48 PM  Type of Therapy:  Psychoeducational Skills  Participation Level:  Minimal  Participation Quality:  Appropriate  Affect:  Flat and Lethargic  Cognitive:  Appropriate  Insight:  Improving  Engagement in Group:  Limited  Modes of Intervention:  Education  Summary of Progress/Problems: The patient expressed in group that she had a visit with her mother today. She states that they both argued and that she intends to call her brother express herself more. As a theme for the day, her wellness strategy is to exercise and do "yoga".   Hazle Coca S 09/29/2014, 8:48 PM

## 2014-09-29 NOTE — Progress Notes (Signed)
1:1 NOTE: D: Patient is observed lying in bed with eyes closed. Pt is heard snoring. Pt appears to be in no acute distress, respirations are even and unlabored. A: 1:1 status maintained per providers orders, MHT with pt at all times. 15 minute checks continued per protocol for patient safety.  R: Pt remains safe.

## 2014-09-29 NOTE — [facility] Group Notes (Signed)
[facility] LCSW Group Therapy  09/29/2014 1:15 pm  Type of Therapy: Process Group Therapy  Participation Level:  In bed asleep  Summary of Progress/Problems: Today's group addressed the issue of overcoming obstacles.  Patients were asked to identify their biggest obstacle post d/c that stands in the way of their on-going success, and then problem solve as to how to manage this.  Ida Rogue 09/29/2014   2:37 PM

## 2014-09-29 NOTE — [facility] Group Notes (Signed)
[facility] LCSW Aftercare Discharge Planning Group Note   09/29/2014 2:30 PM  Participation Quality:  Engaged  Mood/Affect:  Excited  Depression Rating:  denies  Anxiety Rating:  denies  Thoughts of Suicide:  No Will you contract for safety?   NA  Current AVH:  Denies  Plan for Discharge/Comments:  "The Dr yesterday told me I might be able to go home today.  What do you mean I have to get off of the 1:1 first?"  Less agitated and intrusive than on Friday, but still struggling to maintain stable mood.  Still quick to lash out.  Worked hard to raise her hand and wait to be called on when she had something to say.  Able to only maintain silence for certain amount of time before the dam bursts.  Insists that Tegretol makes her a fall risk, but does not exhibit problems with gait.  Says she will only take the benzo and sleep med post d/c.  Transportation Means:   Supports: ACT team  [place], [name]

## 2014-09-29 NOTE — Progress Notes (Signed)
1:1 NOTE: D: Patient is alert. Pt is observed sitting in the dayroom, interacting with others. Pt does not appear to be in any acute distress. Pt is enjoying snack at this time. Pt reports her only concern is that "my doctor this weekend told me if my mom came today and we had a good visit that I could go home with her." A: Active listening by RN. Encouragement/Support provided to pt. Pt remains on 1:1 status per providers orders, MHT with pt at all times. 15 minute checks continued per protocol for patient safety.  R: Pt remains safe.

## 2014-09-29 NOTE — Progress Notes (Signed)
1:1 note   Pt has been calm and cooperative  She has had short periods where she has been impulsive and irritable but has improved since admission   She responds well to redirection   Pt is on a 1:1 for aggressive behaviors and is presently safe

## 2014-09-29 NOTE — Progress Notes (Signed)
[name], female   DOB: 1976-03-05, 39 y.o.   MRN: [id number] [facility] MD Progress Note  09/29/2014 4:23 PM [name]  MRN:  [id number] Subjective: patient states " I am doing good, I want to get out of this place- I have been here too long "   Objective: Patient is seen, case discussed with staff. Patient remains labile, intrusive, and needing frequent redirections from staff. However, she is partially but noticeably improved compared to her initial presentation which was consistent with severe mania. Her current speech is less loud, less pressured, she is less grandiose , less severely irritable/angry and less hypersexual. She does continue to present with labile, irritable affect , however, but improved compared to admission. Of note, staff has noted patient's gait to be somewhat unsteady- with one to one nurse present I have examined patient- does have some parkinsonism symptoms such as  A tendency to shuffling gait, sialorrhea, subtle cog wheeling. Patient has been more compliant with medications - states she " likes the Haldol". Patient did take Tegretol today, but states  She intends not take Tegretol  Because current dose too high.  Insight remains limited .  Principal Problem: Bipolar affective disorder, currently manic, severe, with psychotic features Diagnosis:   Patient Active Problem List   Diagnosis Date Noted  . Bipolar affective disorder, currently manic, severe, with psychotic features [F31.2] 09/24/2014  . Homicidal ideation [R45.850]   . Bipolar disorder, current episode manic w/o psychotic features, severe [F31.13] 08/21/2014  . Cannabis use disorder, moderate, dependence [F12.20] 08/21/2014  . Tobacco use disorder [Z72.0] 08/21/2014   Total Time spent with patient: 25  minutes    Past Medical History:  Past Medical History  Diagnosis Date  . Bipolar affective   . OCD (obsessive compulsive disorder)   . Anxiety    History reviewed. No pertinent  past surgical history. Family History:  Family History  Problem Relation Age of Onset  . Drug abuse Father   . Mental illness Other    Social History:  History  Alcohol Use  . Yes    Comment: socially     History  Drug Use  . Yes  . Special: Marijuana    Comment: occasionally    History   Social History  . Marital Status: Single    Spouse Name: N/A  . Number of Children: N/A  . Years of Education: N/A   Social History Main Topics  . Smoking status: Current Every Day Smoker -- 1.00 packs/day for 1 years    Types: Cigarettes  . Smokeless tobacco: Never Used  . Alcohol Use: Yes     Comment: socially  . Drug Use: Yes    Special: Marijuana     Comment: occasionally  . Sexual Activity: No   Other Topics Concern  . None   Social History Narrative   Additional History:    Sleep:  Improved   Appetite:  Good   Assessment:   Musculoskeletal: Strength & Muscle Tone: within normal limits Gait & Station: normal Patient leans: N/A   Psychiatric Specialty Exam: Physical Exam  ROS denies nausea, denies vomiting.   Blood pressure 140/87, pulse 101, temperature 98.1 F (36.7 C), temperature source Oral, resp. rate 18, height 5\' 3"  (1.6 m), weight 199 lb (90.266 kg), last menstrual period 09/23/2014, SpO2 100 %.Body mass index is 35.26 kg/(m^2).  General Appearance: Fairly Groomed  Patent attorney::  Good  Speech:  less pressured  Volume:  Increased intermittently  Mood:   Denies depression, presents less severely irritable/elated   Affect:  Labile- still with tendency towards irritability  Thought Process: still disorganized   Orientation:  Alert and attentive   Thought Content:   Denies hallucinations, no delusions expressed at this time  Suicidal Thoughts:  No- denies any SI  Homicidal Thoughts:  No  Memory:  Immediate;   Fair Recent;   Fair Remote;   Fair  Judgement:  Poor  Insight:  Shallow  Psychomotor Activity:  Restlessness- pacing at times    Concentration:  Poor  Recall:  Poor  Fund of Knowledge:Poor  Language: Fair  Akathisia:  Negative  Handed:  Right  AIMS (if indicated):     Assets:  Communication Skills  ADL's:  Intact  Cognition: WNL  Sleep:  Number of Hours: 5.75     Current Medications: Current Facility-Administered Medications  Medication Dose Route Frequency Provider Last Rate Last Dose  . acetaminophen (TYLENOL) tablet 650 mg  650 mg Oral Q6H PRN Kerry Hough, PA-C   650 mg at 09/28/14 2239  . alum & mag hydroxide-simeth (MAALOX/MYLANTA) 200-200-20 MG/5ML suspension 30 mL  30 mL Oral Q4H PRN Kerry Hough, PA-C      . carbamazepine (TEGRETOL XR) 12 hr tablet 400 mg  400 mg Oral BID Mojeed Akintayo   400 mg at 09/29/14 0805  . haloperidol (HALDOL) tablet 5 mg  5 mg Oral Q8H PRN Craige Cotta, MD   5 mg at 09/27/14 1610   Or  . haloperidol lactate (HALDOL) injection 5 mg  5 mg Intramuscular Q8H PRN Rockey Situ Cobos, MD      . haloperidol (HALDOL) tablet 5 mg  5 mg Oral BID Craige Cotta, MD   5 mg at 09/29/14 0805  . hydrOXYzine (ATARAX/VISTARIL) tablet 25 mg  25 mg Oral Q4H PRN Kerry Hough, PA-C   25 mg at 09/27/14 1052  . LORazepam (ATIVAN) tablet 1 mg  1 mg Oral Q6H PRN Craige Cotta, MD   1 mg at 09/28/14 2240   Or  . LORazepam (ATIVAN) injection 1 mg  1 mg Intramuscular Q6H PRN Craige Cotta, MD      . LORazepam (ATIVAN) tablet 1 mg  1 mg Oral BID Craige Cotta, MD   1 mg at 09/29/14 0805  . magnesium hydroxide (MILK OF MAGNESIA) suspension 30 mL  30 mL Oral Daily PRN Kerry Hough, PA-C      . traZODone (DESYREL) tablet 50 mg  50 mg Oral QHS,MR X 1 Kerry Hough, PA-C   50 mg at 09/26/14 2205    Lab Results:  No results found for this or any previous visit (from the past 48 hour(s)).  Physical Findings: AIMS:  , ,  ,  ,    CIWA:    COWS:      Assessment - at this time patient is partially improved compared to her admission, but still pressured, disorganized, intrusive  , labile . Insight remains limited, but has been more compliant with medications. She is presenting with some parkinsonism , probably from Haloperidol management . This may be affecting her gait, but Patient also attributes this  to Tegretol, which she states " is too much right now", and which she is refusing to take at current dose. She was stabilized on lower tegretol doses on last admission.     Plan: Daily contact and medication management -Continue Trazodone 50mg  Qhs for insomnia -Continue Ativan 1mg  bid  for agitation -Continue Vistaril  Q4h prn for anxiety -Continue Haldol  bid for delusions -Start Cogentin 1 mgrs BID to minimize EPS.  -Decrease  Tegretol XR to 200 mg bid for bipolar disorder- rationale is that current dose causing side effects, patient unwilling to take at current dose . -Continue 1:1 observation due to manic, intrusive behavior -Obtain Carbamazepine serum level .  Obtain HgbA1C, Repeat EKG - to rule out QTc prolongation: patient  may benefit from converting from Haldol ( which was started related to patient's agitation and refusal to take other medications )to an atypical antipsychotic . As per chart , has responded to [place] and to Abilify in the past .  Medical Decision Making:  Review or order clinical lab tests (1), Established Problem, Worsening (2), Review of Medication Regimen & Side Effects (2) and Review of New Medication or Change in Dosage (2)   Nehemiah Massed, MD 09/29/2014, 4:23 PM

## 2014-09-29 NOTE — Progress Notes (Signed)
Pt has been resting in bed with eyes closed   No distress noted   She is on a 1:1 for aggressive behaviors and poor boundaries   Pt is safe at present

## 2014-09-30 LAB — LIPID PANEL
CHOL/HDL RATIO: 3.7 ratio
Cholesterol: 137 mg/dL (ref 0–200)
HDL: 37 mg/dL — ABNORMAL LOW (ref >40)
LDL Cholesterol: 80 mg/dL (ref 0–99)
TRIGLYCERIDES: 98 mg/dL (ref <150)
VLDL: 20 mg/dL (ref 0–40)

## 2014-09-30 NOTE — Care Management Utilization Note (Signed)
   Per State Regulation 482.30  This chart was reviewed for necessity with respect to the patient's Admission/ Duration of stay.  Next review date: 10/02/14  Camdan Burdi Morrison RN, BSN 

## 2014-09-30 NOTE — Progress Notes (Signed)
[name], female   DOB: 1975-12-06, 39 y.o.   MRN: [id number] [facility] MD Progress Note  09/30/2014 7:50 PM [name]  MRN:  [id number] Subjective: patient reports she is feeling "OK". Denies medication side effects. States she wants to " come off the 1:1 observation". States she is hoping to discharge soon. At this time denies medication side effects.   Objective: Patient is seen, case discussed with staff. Staff reports that patient is better today, with less irritability,  Overall more cooperative + calmer behavior,  less pressured speech. Patient has not exhibited any severely  agitated behaviors today,  Although she has still tended to be argumentative and vaguely irritable at times . Shedenies any self injurious ideations. She states " I feel OK, I just want to leave the hospital soon". At this time she denies medication side effects. No cogwheeling  Or gait disturbances noted today. We discussed medication issues- we reviewed potentially cross-tapering from haldol to an atypical antipsychotic , based on tolerability/side effects concerns. She is aware of potential for Haldol to cause movement disorders, TD. She states that as she is doing better, she prefers to continue current medication regimen at this time.  Lipid panel unremarkable except for mildly decreased HDL.    Principal Problem: Bipolar affective disorder, currently manic, severe, with psychotic features Diagnosis:   Patient Active Problem List   Diagnosis Date Noted  . Bipolar affective disorder, currently manic, severe, with psychotic features [F31.2] 09/24/2014  . Homicidal ideation [R45.850]   . Bipolar disorder, current episode manic w/o psychotic features, severe [F31.13] 08/21/2014  . Cannabis use disorder, moderate, dependence [F12.20] 08/21/2014  . Tobacco use disorder [Z72.0] 08/21/2014   Total Time spent with patient: 25  minutes    Past Medical History:  Past Medical History  Diagnosis  Date  . Bipolar affective   . OCD (obsessive compulsive disorder)   . Anxiety    History reviewed. No pertinent past surgical history. Family History:  Family History  Problem Relation Age of Onset  . Drug abuse Father   . Mental illness Other    Social History:  History  Alcohol Use  . Yes    Comment: socially     History  Drug Use  . Yes  . Special: Marijuana    Comment: occasionally    History   Social History  . Marital Status: Single    Spouse Name: N/A  . Number of Children: N/A  . Years of Education: N/A   Social History Main Topics  . Smoking status: Current Every Day Smoker -- 1.00 packs/day for 1 years    Types: Cigarettes  . Smokeless tobacco: Never Used  . Alcohol Use: Yes     Comment: socially  . Drug Use: Yes    Special: Marijuana     Comment: occasionally  . Sexual Activity: No   Other Topics Concern  . None   Social History Narrative   Additional History:    Sleep:  Improved   Appetite:  Good   Assessment:   Musculoskeletal: Strength & Muscle Tone: within normal limits Gait & Station: normal- no parkinsonian gait, festinating gait noted today Patient leans: N/A   Psychiatric Specialty Exam: Physical Exam  ROS denies nausea, denies vomiting.    Blood pressure 123/77, pulse 116, temperature 98.1 F (36.7 C), temperature source Oral, resp. rate 16, height 5\' 3"  (1.6 m), weight 199 lb (90.266 kg), last menstrual period 09/23/2014, SpO2 100 %.Body mass index is  35.26 kg/(m^2).  General Appearance:  Improved grooming  Eye Contact::  Good  Speech:  less pressured  Volume:   Less loud   Mood:    States mood "OK", denies depression  Affect:   Less labile, less severely irritable, less expansive   Thought Process:  Improving, becoming more linear   Orientation:  Alert and attentive   Thought Content:   Denies hallucinations, no delusions expressed at this time  Suicidal Thoughts:  No- denies any SI  Homicidal Thoughts:  No  Memory:   Immediate;   Fair Recent;   Fair Remote;   Fair  Judgement:  Poor  Insight:  Shallow  Psychomotor Activity:  Restlessness- pacing at times   Concentration:  Poor  Recall:  Poor  Fund of Knowledge:Poor  Language: Fair  Akathisia:  Negative  Handed:  Right  AIMS (if indicated):     Assets:  Communication Skills  ADL's:  Intact  Cognition: WNL  Sleep:  Number of Hours: 6.5     Current Medications: Current Facility-Administered Medications  Medication Dose Route Frequency Provider Last Rate Last Dose  . acetaminophen (TYLENOL) tablet 650 mg  650 mg Oral Q6H PRN Kerry Hough, PA-C   650 mg at 09/28/14 2239  . alum & mag hydroxide-simeth (MAALOX/MYLANTA) 200-200-20 MG/5ML suspension 30 mL  30 mL Oral Q4H PRN Kerry Hough, PA-C      . benztropine (COGENTIN) tablet 1 mg  1 mg Oral BID Craige Cotta, MD   1 mg at 09/30/14 1703  . carbamazepine (TEGRETOL XR) 12 hr tablet 200 mg  200 mg Oral BID Craige Cotta, MD   200 mg at 09/30/14 1704  . haloperidol (HALDOL) tablet 5 mg  5 mg Oral BID Craige Cotta, MD   5 mg at 09/30/14 1703  . hydrOXYzine (ATARAX/VISTARIL) tablet 25 mg  25 mg Oral Q4H PRN Kerry Hough, PA-C   25 mg at 09/27/14 1052  . LORazepam (ATIVAN) tablet 1 mg  1 mg Oral Q6H PRN Craige Cotta, MD   1 mg at 09/30/14 0017   Or  . LORazepam (ATIVAN) injection 1 mg  1 mg Intramuscular Q6H PRN Craige Cotta, MD      . LORazepam (ATIVAN) tablet 1 mg  1 mg Oral BID Craige Cotta, MD   1 mg at 09/30/14 1703  . magnesium hydroxide (MILK OF MAGNESIA) suspension 30 mL  30 mL Oral Daily PRN Kerry Hough, PA-C      . traZODone (DESYREL) tablet 50 mg  50 mg Oral QHS,MR X 1 Kerry Hough, PA-C   50 mg at 09/26/14 2205    Lab Results:  Results for orders placed or performed during the hospital encounter of 09/24/14 (from the past 48 hour(s))  Lipid panel     Status: Abnormal   Collection Time: 09/30/14  6:23 AM  Result Value Ref Range   Cholesterol 137 0 -  200 mg/dL   Triglycerides 98 <161 mg/dL   HDL 37 (L) >09 mg/dL   Total CHOL/HDL Ratio 3.7 RATIO   VLDL 20 0 - 40 mg/dL   LDL Cholesterol 80 0 - 99 mg/dL    Comment:        Total Cholesterol/HDL:CHD Risk Coronary Heart Disease Risk Table                     Men   Women  1/2 Average Risk   3.4   3.3  Average Risk       5.0   4.4  2 X Average Risk   9.6   7.1  3 X Average Risk  23.4   11.0        Use the calculated Patient Ratio above and the CHD Risk Table to determine the patient's CHD Risk.        ATP III CLASSIFICATION (LDL):  <100     mg/dL   Optimal  161-096  mg/dL   Near or Above                    Optimal  130-159  mg/dL   Borderline  045-409  mg/dL   High  >811     mg/dL   Very High Performed at [facility]     Physical Findings: AIMS: Facial and Oral Movements Muscles of Facial Expression: None, normal Lips and Perioral Area: None, normal Jaw: None, normal Tongue: None, normal,Extremity Movements Upper (arms, wrists, hands, fingers): None, normal Lower (legs, knees, ankles, toes): None, normal, Trunk Movements Neck, shoulders, hips: None, normal, Overall Severity Severity of abnormal movements (highest score from questions above): None, normal Incapacitation due to abnormal movements: None, normal Patient's awareness of abnormal movements (rate only patient's report): No Awareness, Dental Status Current problems with teeth and/or dentures?: No Does patient usually wear dentures?: No  CIWA:    COWS:      Assessment -  Patient improved compared to prior presentation- less irritable, less agitated, less disorganized, calmer overall. As discussed with Nursing Staff, treatment team feels that at this time one to one observation can be stopped. Patient tolerating medications well at present . Does not want to change Haldol for another antipsychotic at this time, and today no EPS noted.     Plan: Daily contact and medication management -D/C 1 to 1  monitoring based on clinical improvement -Continue Trazodone  Qhs for insomnia -Continue Ativan  bid for agitation -Continue Vistaril  Q4h prn for anxiety -Continue Haldol  bid for delusions - Continue Cogentin 1 mgrs BID to minimize EPS.  - Continue  Tegretol XR to 200 mg bid for bipolar disorder    Medical Decision Making:  Review or order clinical lab tests (1), Established Problem, Worsening (2), Review of Medication Regimen & Side Effects (2) and Review of New Medication or Change in Dosage (2)   Shunda Rabadi, MD 09/30/2014, 7:50 PM

## 2014-09-30 NOTE — Progress Notes (Signed)
[facility] Post 1:1 Observation Documentation  For the first (8) hours following discontinuation of 1:1 precautions, a progress note entry by nursing staff should be documented at least every 2 hours, reflecting the patient's behavior, condition, mood, and conversation.  Use the progress notes for additional entries.  Time 1:1 discontinued:  1745  Patient's Behavior:  Pt is in her room resting   She is calm and cooperative  Patient's Condition:  stable  Patient's Conversation:  Pt conversation is appropriate  Andrena Mews 09/30/2014, 8:00 PM

## 2014-09-30 NOTE — Progress Notes (Signed)
Pt has been resting in bed with eyes closed   She got up around midnight and received ativan and tylenol   Her behavior has greatly improved   Pt on 1:1  And is presently safe

## 2014-09-30 NOTE — Progress Notes (Signed)
[facility] Post 1:1 Observation Documentation    Time 1:1 discontinued:  1745  Patient's Behavior:  Behavior calm and cooperative at this time, pleasant on approach.   Patient's Condition:  No s/s of acute distress at this time. Patient voices no complaints. Presents in her room.   Patient's Conversation:  Patient denies SI/HI  Dara Hoyer 09/30/2014, [phone]

## 2014-09-30 NOTE — Progress Notes (Signed)
[facility] Post 1:1 Observation Documentation  For the first (8) hours following discontinuation of 1:1 precautions, a progress note entry by nursing staff should be documented at least every 2 hours, reflecting the patient's behavior, condition, mood, and conversation.  Use the progress notes for additional entries.  Time 1:1 discontinued:  1730  Patient's Behavior:  Calm and appropriate  Patient's Condition:  stable  Patient's Conversation:  Loud but appropriate  Andrena Mews 09/30/2014, 11:35 PM

## 2014-09-30 NOTE — Tx Team (Signed)
Interdisciplinary Treatment Plan Update (Adult)  Date:  09/30/2014   Time Reviewed:  2:37 PM   Progress in Treatment: Attending groups: Yes. Participating in groups:  Yes. Taking medication as prescribed:  Yes. Tolerating medication:  Yes. Family/Significant othe contact made:  Yes Patient understands diagnosis:  Yes   Discussing patient identified problems/goals with staff:  Yes, see initial care plan. Medical problems stabilized or resolved:  Yes. Denies suicidal/homicidal ideation: Yes. Issues/concerns per patient self-inventory:  No. Other:  New problem(s) identified:  Discharge Plan or Barriers:  Return home, follow up outpt  Reason for Continuation of Hospitalization: Medication stabilization Other; describe Mood lability  Comments:  " I am doing good, I want to get out of this place- I have been here too long " Patient remains labile, intrusive, and needing frequent redirections from staff. However, she is partially but noticeably improved compared to her initial presentation which was consistent with severe mania. Her current speech is less loud, less pressured, she is less grandiose , less severely irritable/angry and less hypersexual. She does continue to present with labile, irritable affect , however, but improved compared to admission. Of note, staff has noted patient's gait to be somewhat unsteady- with one to one nurse present I have examined patient- does have some parkinsonism symptoms such as A tendency to shuffling gait, sialorrhea, subtle cog wheeling. Patient has been more compliant with medications - states she " likes the Haldol". Patient did take Tegretol today, but states She intends not take Tegretol Because current dose too high.  Insight remains limited .   Explore possibility of Haldol Dec with pt.  Estimated length of stay: 2-4 days  New goal(s):  Review of initial/current patient goals per problem list:     Attendees: Patient:  09/30/2014  2:37 PM   Family:   09/30/2014 2:37 PM   Physician:  Jomarie Longs, MD 09/30/2014 2:37 PM   Nursing:   Marzetta Board, RN 09/30/2014 2:37 PM   CSW:    Daryel Gerald, LCSW   09/30/2014 2:37 PM   Other:  09/30/2014 2:37 PM   Other:   09/30/2014 2:37 PM   Other:  Onnie Boer, Nurse CM 09/30/2014 2:37 PM   Other:  Leisa Lenz, Vesta Mixer TCT 09/30/2014 2:37 PM   Other:  Tomasita Morrow, P4CC  09/30/2014 2:37 PM   Other:  09/30/2014 2:37 PM   Other:  09/30/2014 2:37 PM   Other:  09/30/2014 2:37 PM   Other:  09/30/2014 2:37 PM   Other:  09/30/2014 2:37 PM   Other:   09/30/2014 2:37 PM    Scribe for Treatment Team:   Ida Rogue, 09/30/2014 2:37 PM

## 2014-09-30 NOTE — Progress Notes (Signed)
[facility] Post 1:1 Observation Documentation  For the first (8) hours following discontinuation of 1:1 precautions, a progress note entry by nursing staff should be documented at least every 2 hours, reflecting the patient's behavior, condition, mood, and conversation.  Use the progress notes for additional entries.  Time 1:1 discontinued:  1745  Patient's Behavior:  Appropriate boundaries  co-operative   Patient's Condition:  Stable   Patient's Conversation: appropriate conversation   Andrena Mews 09/30/2014, 9:30 PM

## 2014-09-30 NOTE — Progress Notes (Signed)
D   Pt has been in her room sleeping most of the shift but has come out of her room periodically and talks to staff and peers   She is loud but mostly appropriate  Her boundaries have improved greatly and she has not displayed any aggressive behaviors  A   Verbal support given   Medications offered and educated on   Q 15 min checks R   Pt is safe   Will continue to monitor for aggressive behaviors

## 2014-09-30 NOTE — Progress Notes (Signed)
0730  Patient sitting in dayroom with 1:1 present and eating her breakfast.  Patient denied SI and HI.  Denied A/V hallucinations.  Denied pain.  Respirations even and unlabored.  No signs/symptoms of pain/distress noted on patient's face/body movements.  1:1 continues for safety per MD order.

## 2014-09-30 NOTE — [facility] Group Notes (Signed)
[facility] LCSW Group Therapy  09/30/2014 , 5:08 PM   Type of Therapy:  Group Therapy  Participation Level:  Active  Participation Quality:  Attentive  Affect:  Appropriate  Cognitive:  Alert  Insight:  Improving  Engagement in Therapy:  Engaged  Modes of Intervention:  Discussion, Exploration and Socialization  Summary of Progress/Problems: Today's group focused on the term Diagnosis.  Participants were asked to define the term, and then pronounce whether it is a negative, positive or neutral term.  Stayed the entire time.  Not disruptive at all today.   Able to track the conversation and contributed appropriately.  Talked about her mental health diagnosis and emphasized several times the importance of taking meds to address symptoms.  [name] 09/30/2014 , 5:08 PM

## 2014-09-30 NOTE — Progress Notes (Signed)
1:1 observation discontinued per MD order. Will monitor on special checks q 15 mins for safety.

## 2014-09-30 NOTE — [facility] Group Notes (Signed)
[facility] Group Notes:  (Nursing/MHT/Case Management/Adjunct)  Date:  09/30/2014  Time: 0900  Type of Therapy:  Nurse Education  Participation Level:  Active  Participation Quality:  Intrusive and Sharing  Affect:  Angry, Anxious and Irritable  Cognitive:  Alert and Oriented  Insight:  Lacking  Engagement in Group:  Defensive and Distracting  Modes of Intervention:  Discussion and Socialization  Summary of Progress/Problems:  Dara Hoyer 09/30/2014, 3:36 PM

## 2014-09-30 NOTE — Progress Notes (Signed)
Adult Psychoeducational Group Note  Date:  09/30/2014 Time:  11:34 PM  Group Topic/Focus:  Wrap-Up Group:   The focus of this group is to help patients review their daily goal of treatment and discuss progress on daily workbooks.  Participation Level:  Did Not Attend  Participation Quality:  Pt did not attend  Affect:  Pt did not attend  Cognitive:  Pt did not attend  Insight: None  Engagement in Group:  None  Modes of Intervention:  Pt did not attend  Additional Comments:  Pt did not attend this group session.  Malachy Moan 09/30/2014, 11:34 PM

## 2014-09-30 NOTE — Progress Notes (Signed)
1:1 NOTE: Patient attended nursing group with assigned staff member. Disruptive during group, verbally aggressive , argumentative during group. Per patient self inventory form patient reports she slept good last night with the use of sleep medication, she reports a good appetite, normal energy level, good concentration. She rates depression 9/10, hopelessness 9/10, anxiety 5/10- all on 1-10 scale, 10 being the worse. She denies SI. "I have never wanted to hurt myself." She denies physical problems. She reports her goal today is "talk to doctor." Remains on 1:1 to closely monitor behavior.

## 2014-09-30 NOTE — Progress Notes (Signed)
1:1 note    Pt slept through the nite without difficulty   She is calm and co operative when she woke up   Pt is on a 1:1 for aggressive behaviors and poor boundaries   Her behavior and boundaries have improved over the past 3 days   She is presently safe

## 2014-09-30 NOTE — Progress Notes (Signed)
1:1 Note: Patient behavior calm and cooperative at this time. She denies SI/HI. Reports she ate 100% of her lunch, pleasant. No s/s of acute distress noted, no apparent s/s of responding to internal stimuli. She voices no complaints at this time. Assigned MHT with pt. Will continue to monitor on 1:1 per MD order.

## 2014-10-01 LAB — HEMOGLOBIN A1C
HEMOGLOBIN A1C: 6.5 % — AB (ref 4.8–5.6)
MEAN PLASMA GLUCOSE: 140 mg/dL

## 2014-10-01 NOTE — Progress Notes (Signed)
[name], female   DOB: 02-18-1976, 39 y.o.   MRN: [id number] [facility] MD Progress Note  10/01/2014 7:58 PM [name]  MRN:  [id number] Subjective: patient reports she is doing "OK". At this time does not endorse depression or neuro-vegetative symptoms of depression. She also denies any hypomanic symptoms, denies feeling irritable. She states she feels medications are working well for her at this time. Denies medication side effects. Currently focused on being discharged home soon.   Objective: Patient is seen, case discussed with staff. As per staff, continues to improve , and over recent days dramatic improvement has been noted- less irritable, less intrusive, better related, more pleasant, better able to participate in milieu/groups . Currently presents calm and appropriate with writer, and states she is hoping to be discharged tomorrow " I feel I am ready, I know myself and I feel I am OK right now" Denies medication side effects. At this time no EPS noted, no akathisia . As noted, she feels Haldol is working well for her at present and she is not wanting to make changes, such as switching to an atypical antipsychotic medication , at this time. HgbA1C 6.5 , which places her on DM range , lipid panel unremarkable except for slightly low HDL.    Principal Problem: Bipolar affective disorder, currently manic, severe, with psychotic features Diagnosis:   Patient Active Problem List   Diagnosis Date Noted  . Bipolar affective disorder, currently manic, severe, with psychotic features [F31.2] 09/24/2014  . Homicidal ideation [R45.850]   . Bipolar disorder, current episode manic w/o psychotic features, severe [F31.13] 08/21/2014  . Cannabis use disorder, moderate, dependence [F12.20] 08/21/2014  . Tobacco use disorder [Z72.0] 08/21/2014   Total Time spent with patient: 20 minutes   Past Medical History:  Past Medical History  Diagnosis Date  . Bipolar affective   .  OCD (obsessive compulsive disorder)   . Anxiety    History reviewed. No pertinent past surgical history. Family History:  Family History  Problem Relation Age of Onset  . Drug abuse Father   . Mental illness Other    Social History:  History  Alcohol Use  . Yes    Comment: socially     History  Drug Use  . Yes  . Special: Marijuana    Comment: occasionally    History   Social History  . Marital Status: Single    Spouse Name: N/A  . Number of Children: N/A  . Years of Education: N/A   Social History Main Topics  . Smoking status: Current Every Day Smoker -- 1.00 packs/day for 1 years    Types: Cigarettes  . Smokeless tobacco: Never Used  . Alcohol Use: Yes     Comment: socially  . Drug Use: Yes    Special: Marijuana     Comment: occasionally  . Sexual Activity: No   Other Topics Concern  . None   Social History Narrative   Additional History:    Sleep:  Improved   Appetite:  Good   Assessment:   Musculoskeletal: Strength & Muscle Tone: within normal limits Gait & Station: normal- no parkinsonian gait, festinating gait noted today Patient leans: N/A   Psychiatric Specialty Exam: Physical Exam  ROS denies nausea, denies vomiting.    Blood pressure 105/86, pulse 115, temperature 98.8 F (37.1 C), temperature source Oral, resp. rate 16, height 5\' 3"  (1.6 m), weight 199 lb (90.266 kg), last menstrual period 09/23/2014, SpO2 100 %.Body mass  index is 35.26 kg/(m^2).  General Appearance:  Improved grooming  Eye Contact::  Good  Speech:  Normal   Volume: normal   Mood:    States mood "OK", denies depression  Affect:    Reactive, not expansive or irritable at this time  Thought Process:  Improving, becoming more linear   Orientation:  Alert and attentive   Thought Content:   Denies hallucinations, no delusions expressed at this time  Suicidal Thoughts:  No- denies any SI  Homicidal Thoughts:  No  Memory:  Immediate;   Fair Recent;   Fair Remote;    Fair  Judgement:  Poor  Insight:  Shallow  Psychomotor Activity:  Normal, no restlessness or agitation  Concentration:  Poor  Recall:  Poor  Fund of Knowledge:Poor  Language: Fair  Akathisia:  Negative  Handed:  Right  AIMS (if indicated):     Assets:  Communication Skills  ADL's:  Intact  Cognition: WNL  Sleep:  Number of Hours: 6.75     Current Medications: Current Facility-Administered Medications  Medication Dose Route Frequency Provider Last Rate Last Dose  . acetaminophen (TYLENOL) tablet 650 mg  650 mg Oral Q6H PRN Kerry Hough, PA-C   650 mg at 09/28/14 2239  . alum & mag hydroxide-simeth (MAALOX/MYLANTA) 200-200-20 MG/5ML suspension 30 mL  30 mL Oral Q4H PRN Kerry Hough, PA-C      . benztropine (COGENTIN) tablet 1 mg  1 mg Oral BID Craige Cotta, MD   1 mg at 10/01/14 1614  . carbamazepine (TEGRETOL XR) 12 hr tablet 200 mg  200 mg Oral BID Craige Cotta, MD   200 mg at 10/01/14 1614  . haloperidol (HALDOL) tablet 5 mg  5 mg Oral BID Craige Cotta, MD   5 mg at 10/01/14 1614  . hydrOXYzine (ATARAX/VISTARIL) tablet 25 mg  25 mg Oral Q4H PRN Kerry Hough, PA-C   25 mg at 09/27/14 1052  . LORazepam (ATIVAN) tablet 1 mg  1 mg Oral Q6H PRN Craige Cotta, MD   1 mg at 09/30/14 2352   Or  . LORazepam (ATIVAN) injection 1 mg  1 mg Intramuscular Q6H PRN Craige Cotta, MD      . LORazepam (ATIVAN) tablet 1 mg  1 mg Oral BID Craige Cotta, MD   1 mg at 10/01/14 1614  . magnesium hydroxide (MILK OF MAGNESIA) suspension 30 mL  30 mL Oral Daily PRN Kerry Hough, PA-C      . traZODone (DESYREL) tablet 50 mg  50 mg Oral QHS,MR X 1 Kerry Hough, PA-C   50 mg at 09/26/14 2205    Lab Results:  Results for orders placed or performed during the hospital encounter of 09/24/14 (from the past 48 hour(s))  Hemoglobin A1c     Status: Abnormal   Collection Time: 09/30/14  6:23 AM  Result Value Ref Range   Hgb A1c MFr Bld 6.5 (H) 4.8 - 5.6 %    Comment: (NOTE)          Pre-diabetes: 5.7 - 6.4         Diabetes: >6.4         Glycemic control for adults with diabetes: <7.0    Mean Plasma Glucose 140 mg/dL    Comment: (NOTE) Performed At: [facility] [address] [place], [place] [id number] Mila Homer MD ZH:[id number] Performed at [facility]   Lipid panel     Status:  Abnormal   Collection Time: 09/30/14  6:23 AM  Result Value Ref Range   Cholesterol 137 0 - 200 mg/dL   Triglycerides 98 <409 mg/dL   HDL 37 (L) >81 mg/dL   Total CHOL/HDL Ratio 3.7 RATIO   VLDL 20 0 - 40 mg/dL   LDL Cholesterol 80 0 - 99 mg/dL    Comment:        Total Cholesterol/HDL:CHD Risk Coronary Heart Disease Risk Table                     Men   Women  1/2 Average Risk   3.4   3.3  Average Risk       5.0   4.4  2 X Average Risk   9.6   7.1  3 X Average Risk  23.4   11.0        Use the calculated Patient Ratio above and the CHD Risk Table to determine the patient's CHD Risk.        ATP III CLASSIFICATION (LDL):  <100     mg/dL   Optimal  191-478  mg/dL   Near or Above                    Optimal  130-159  mg/dL   Borderline  295-621  mg/dL   High  >308     mg/dL   Very High Performed at [facility]     Physical Findings: AIMS: Facial and Oral Movements Muscles of Facial Expression: None, normal Lips and Perioral Area: None, normal Jaw: None, normal Tongue: None, normal,Extremity Movements Upper (arms, wrists, hands, fingers): None, normal Lower (legs, knees, ankles, toes): None, normal, Trunk Movements Neck, shoulders, hips: None, normal, Overall Severity Severity of abnormal movements (highest score from questions above): None, normal Incapacitation due to abnormal movements: None, normal Patient's awareness of abnormal movements (rate only patient's report): No Awareness, Dental Status Current problems with teeth and/or dentures?: No Does patient usually wear dentures?: No  CIWA:    COWS:      Assessment  -  Much improved, currently no significant manic symptoms noted , and is  Affect currently pleasant, reactive. Better able to participate in groups and milieu. No current psychotic symptoms noted or reported. Denies any SI. Tolerating medications well . Hoping for discharge soon.  Of note, Hgb A1C elevated at 6.5    Plan: Daily contact and medication management -D/C 1 to 1 monitoring based on clinical improvement -Continue Trazodone 50mg  Qhs for insomnia -Continue Ativan 1mg  bid for agitation -Continue Vistaril 25mg  Q4h prn for anxiety -Continue Haldol 5mg  bid for delusions - Continue Cogentin 1 mgrs BID to minimize EPS.  - Continue  Tegretol XR to 200 mg bid for bipolar disorder - Dietary Consult and Diabetic Consult to address elevated Hgb A1C finding.  -Consider discharge soon as she continues to stabilize .    Medical Decision Making:  Review or order clinical lab tests (1), Established Problem, Worsening (2), Review of Medication Regimen & Side Effects (2) and Review of New Medication or Change in Dosage (2)   Nehemiah Massed, MD 10/01/2014, 7:58 PM

## 2014-10-01 NOTE — Progress Notes (Signed)
D: Patient behavior calm and cooperative. Pleasant on approach. She denies SI/HI. Denies AVH. In room majority of the shift. Reports she is happy about being discharged for tomorrow.  She denies physical pain.  A:Special checks q 15 mins in place for safety. Medication administered per MD order. Encouragement and support provided.  R:Safety maintained. Compliant with medication. Will continue to monitor.

## 2014-10-01 NOTE — Plan of Care (Signed)
Problem: Alteration in thought process Goal: STG-Patient is able to follow short directions Outcome: Progressing Patient is able to follow short directions.     

## 2014-10-01 NOTE — [facility] Group Notes (Signed)
[facility] LCSW Group Therapy  10/01/2014 2:18 PM   Type of Therapy:  Group Therapy  Participation Level:  Invited.  Chose to not attend  Summary of Progress/Problems: Today's group focused on relapse prevention.  We defined the term, and then brainstormed on ways to prevent relapse.  [name] 10/01/2014 , 2:18 PM

## 2014-10-01 NOTE — Progress Notes (Signed)
[facility] Post 1:1 Observation Documentation  For the first (8) hours following discontinuation of 1:1 precautions, a progress note entry by nursing staff should be documented at least every 2 hours, reflecting the patient's behavior, condition, mood, and conversation.  Use the progress notes for additional entries.  Time 1:1 discontinued:  09/30/14  1745  Patient's Behavior:  Appropriate calm no aggression seen or reported  Patient's Condition:  stable  Patient's Conversation:  Loud but appropriate  Andrena Mews 10/01/2014, 3:54 AM

## 2014-10-01 NOTE — Plan of Care (Signed)
Problem: Aggression Towards others,Towards Self, and or Destruction Goal: STG-Patient will comply with prescribed medication regimen (Patient will comply with prescribed medication regimen)  Outcome: Progressing Patient compliant with prescribed medication regimen.

## 2014-10-01 NOTE — [facility] Suicide Risk Assessment (Signed)
[facility] INPATIENT:  Family/Significant Other Suicide Prevention Education  Suicide Prevention Education:  Education Completed; No one has been identified by the patient as the family member/significant other with whom the patient will be residing, and identified as the person(s) who will aid the patient in the event of a mental health crisis (suicidal ideations/suicide attempt).  With written consent from the patient, the family member/significant other has been provided the following suicide prevention education, prior to the and/or following the discharge of the patient.  The suicide prevention education provided includes the following:  Suicide risk factors  Suicide prevention and interventions  National Suicide Hotline telephone number  [facility] assessment telephone number  [facility] Emergency Assistance 911  [facility] and/or Residential Mobile Crisis Unit telephone number  Request made of family/significant other to:  Remove weapons (e.g., guns, rifles, knives), all items previously/currently identified as safety concern.    Remove drugs/medications (over-the-counter, prescriptions, illicit drugs), all items previously/currently identified as a safety concern.  The family member/significant other verbalizes understanding of the suicide prevention education information provided.  The family member/significant other agrees to remove the items of safety concern listed above. The patient did not endorse SI at the time of admission, nor did the patient c/o SI during the stay here.  SPE not required.   [name] 10/01/2014, 2:20 PM

## 2014-10-01 NOTE — [facility] Group Notes (Signed)
[facility] LCSW Aftercare Discharge Planning Group Note   10/01/2014 2:15 PM  Participation Quality:  Engaged  Mood/Affect:  Flat  Depression Rating:  denies  Anxiety Rating:  denies  Thoughts of Suicide:  No Will you contract for safety?   NA  Current AVH:  Denies  Plan for Discharge/Comments:  Velencia greeted me warmly, and let me know she is looking forward to her d/c tomorrow.  She is appropriate, her mood is good and she is goal directed. Transportation Means: family  Supports: family, ACT team  [place], [place] B

## 2014-10-02 LAB — CARBAMAZEPINE, FREE AND TOTAL
Carbamazepine, Free: 3.8 ug/mL (ref 0.6–4.2)
Carbamazepine, Total: 13.1 ug/mL — ABNORMAL HIGH (ref 4.0–12.0)

## 2014-10-02 MED ORDER — HYDROXYZINE HCL 25 MG PO TABS: 25.0000 mg | ORAL_TABLET | ORAL | Status: DC | PRN

## 2014-10-02 MED ORDER — HALOPERIDOL 5 MG PO TABS: 5.0000 mg | ORAL_TABLET | Freq: Two times a day (BID) | ORAL | Status: DC

## 2014-10-02 MED ORDER — CARBAMAZEPINE ER 200 MG PO TB12: 200.0000 mg | ORAL_TABLET | Freq: Two times a day (BID) | ORAL | Status: DC

## 2014-10-02 MED ORDER — BENZTROPINE MESYLATE 1 MG PO TABS: 1.0000 mg | ORAL_TABLET | Freq: Two times a day (BID) | ORAL | Status: DC

## 2014-10-02 MED ORDER — TRAZODONE HCL 50 MG PO TABS: 50.0000 mg | ORAL_TABLET | Freq: Every evening | ORAL | Status: DC | PRN

## 2014-10-02 MED ORDER — LORAZEPAM 1 MG PO TABS: 1.0000 mg | ORAL_TABLET | Freq: Two times a day (BID) | ORAL | Status: DC

## 2014-10-02 NOTE — Progress Notes (Signed)
[name], female   DOB: 20-Jul-1975, 39 y.o.   MRN: [id number]  D: Patient much improved from admission. Pleasant and cooperative. No longer agitated, cursing, or threatening people. No SI/HI or a/v hallucinations A: Obtained all belongings from room, prescriptions, and medications. R: Mother here to pick up in lobby

## 2014-10-02 NOTE — [facility] Suicide Risk Assessment (Signed)
[facility] Discharge Suicide Risk Assessment  Patient is a 39 year old female diagnosed with Bipolar Disorder, manic who this morning reports that she is doing well. Patient states that she plans to take her medications regularly, Has an ACT team and plans to also get vocational training Patient denies any symptoms of depression, mania or psychosis Demographic Factors:  Adolescent or young adult, Low socioeconomic status, Living alone and Unemployed  Total Time spent with patient: 30 minutes  Musculoskeletal: Strength & Muscle Tone: within normal limits Gait & Station: normal Patient leans: N/A  Psychiatric Specialty Exam: Physical Exam  Review of Systems  Constitutional: Negative.  Negative for fever, chills, weight loss and malaise/fatigue.  HENT: Negative.  Negative for congestion and sore throat.   Eyes: Negative.  Negative for blurred vision, double vision and discharge.  Respiratory: Negative.  Negative for cough, shortness of breath and wheezing.   Cardiovascular: Negative.  Negative for chest pain and palpitations.  Gastrointestinal: Negative.  Negative for heartburn, nausea, vomiting, abdominal pain, diarrhea and constipation.  Genitourinary: Negative.  Negative for dysuria and urgency.  Musculoskeletal: Negative.  Negative for myalgias and falls.  Skin: Negative.  Negative for rash.  Neurological: Negative.  Negative for dizziness, tingling, seizures, loss of consciousness, weakness and headaches.  Endo/Heme/Allergies: Negative.  Negative for environmental allergies.  Psychiatric/Behavioral: Positive for substance abuse. Negative for depression, suicidal ideas, hallucinations and memory loss. The patient is not nervous/anxious and does not have insomnia.        Cannabis and tobacco    Blood pressure 124/84, pulse 113, temperature 98.3 F (36.8 C), temperature source Oral, resp. rate 20, height  (1.6 m), weight 90.266 kg (199 lb), last menstrual period 09/23/2014, SpO2 100  %.Body mass index is 35.26 kg/(m^2).  General Appearance: Casual  Eye Contact::  Fair  Speech:  Clear and Coherent and Normal Rate  Volume:  Normal  Mood:  Euthymic  Affect:  Congruent and Full Range  Thought Process:  Coherent and Intact  Orientation:  Full (Time, Place, and Person)  Thought Content:  WDL  Suicidal Thoughts:  No  Homicidal Thoughts:  No  Memory:  Immediate;   Fair Recent;   Fair Remote;   Fair  Judgement:  Fair  Insight:  Present  Psychomotor Activity:  Normal  Concentration:  Fair  Recall:  [employer] of Knowledge:Fair  Language: Fair  Akathisia:  No  Handed:  Right  AIMS (if indicated):     Assets:  Communication Skills Desire for Improvement Housing Physical Health Transportation  Sleep:  Number of Hours: 6.25  Cognition: WNL  ADL's:  Intact   Have you used any form of tobacco in the last 30 days? (Cigarettes, Smokeless Tobacco, Cigars, and/or Pipes): Yes  Has this patient used any form of tobacco in the last 30 days? (Cigarettes, Smokeless Tobacco, Cigars, and/or Pipes) Yes, Prescription not provided because: as patient refuses  Mental Status Per Nursing Assessment::   On Admission:     Current Mental Status by Physician: NA  Loss Factors: Decrease in vocational status and Financial problems/change in socioeconomic status  Historical Factors: Family history of mental illness or substance abuse and Impulsivity  Risk Reduction Factors:   Sense of responsibility to family and Positive social support  Continued Clinical Symptoms:  Bipolar Disorder:   Mixed State More than one psychiatric diagnosis Previous Psychiatric Diagnoses and Treatments  Cognitive Features That Contribute To Risk:  None    Suicide Risk:  Minimal: No identifiable suicidal ideation.  Patients presenting with no risk factors but with morbid ruminations; may be classified as minimal risk based on the severity of the depressive symptoms  Principal Problem: Bipolar  affective disorder, currently manic, severe, with psychotic features Discharge Diagnoses:  Patient Active Problem List   Diagnosis Date Noted  . Bipolar affective disorder, currently manic, severe, with psychotic features [F31.2] 09/24/2014  . Homicidal ideation [R45.850]   . Bipolar disorder, current episode manic w/o psychotic features, severe [F31.13] 08/21/2014  . Cannabis use disorder, moderate, dependence [F12.20] 08/21/2014  . Tobacco use disorder [Z72.0] 08/21/2014    Follow-up Information    Follow up with [facility].   Why:  Someone will see you the day after discharge.  Call them to see what time they will be by.   Contact information:   [address]  [address]  [place] [phone]      Plan Of Care/Follow-up recommendations:  Activity:  as tolerated Diet:  regular Other:  Needs to follow up with PCP as patient's fasting blood sugar was high when done twice in the hospital  Is patient on multiple antipsychotic therapies at discharge:  No   Has Patient had three or more failed trials of antipsychotic monotherapy by history:  No  Recommended Plan for Multiple Antipsychotic Therapies: NA    Anet Log[place] 10/02/2014, 9:56 AM

## 2014-10-02 NOTE — Tx Team (Signed)
Interdisciplinary Treatment Plan Update (Adult)  Date:  10/02/2014   Time Reviewed:  10:07 AM   Progress in Treatment: Attending groups: Yes. Participating in groups:  Yes. Taking medication as prescribed:  Yes. Tolerating medication:  Yes. Family/Significant othe contact made:   Patient understands diagnosis:  Yes   Discussing patient identified problems/goals with staff:  Yes, see initial care plan. Medical problems stabilized or resolved:  Yes. Denies suicidal/homicidal ideation: Yes. Issues/concerns per patient self-inventory:  No. Other:  New problem(s) identified:  Discharge Plan or Barriers:  Return home, follow up outpt  Reason for Continuation of Hospitalization:   Comments:  Estimated length of stay:  D/C today     Attendees: Patient:  10/02/2014 10:07 AM   Family:   10/02/2014 10:07 AM   Physician:  Dr Lucianne Muss, MD 10/02/2014 10:07 AM   Nursing:   Sherryl Manges, RN 10/02/2014 10:07 AM   CSW:    Daryel Gerald, LCSW   10/02/2014 10:07 AM   Other:  10/02/2014 10:07 AM   Other:   10/02/2014 10:07 AM   Other:  Onnie Boer, Nurse CM 10/02/2014 10:07 AM   Other:  Leisa Lenz, [facility] 10/02/2014 10:07 AM   Other:  Tomasita Morrow, P4CC  10/02/2014 10:07 AM   Other:  10/02/2014 10:07 AM   Other:  10/02/2014 10:07 AM   Other:  10/02/2014 10:07 AM   Other:  10/02/2014 10:07 AM   Other:  10/02/2014 10:07 AM   Other:   10/02/2014 10:07 AM    Scribe for Treatment Team:   Ida Rogue, 10/02/2014 10:07 AM

## 2014-10-02 NOTE — Progress Notes (Signed)
NUTRITION NOTE  Pt seen for consult for HgbA1c 6.5. Pt reports she did not eat breakfast this AM because her family is taking her to a restaurant when she discharges this afternoon. She states that her appetite has been good overall since admission.   At home, pt typically did not eat breakfast. Lunch consisted of something light such as cereal or a grilled cheese and dinner consisted of a meat and starch or vegetable (pt gave the example of steak and mashed potatoes). She states that she typically drinks Simply Lemonade and Hawaiian Punch. She indicates that no one informed her of HgbA1c reading. Explained to pt what this number indicates and that cutting back on excess sugar when possible, such as switching to more water and Crystal Light lemonade, can be helpful in the long term.  Pt appreciative of information and verbalizes understanding. She did not have any questions at time of visit.    Trenton Gammon, RD, LDN Inpatient Clinical Dietitian Pager # [phone] After hours/weekend pager # [phone]

## 2014-10-02 NOTE — Progress Notes (Signed)
D: Pt behavior today has been calm, cooperative and pleasant, have however been in bed most of the evening.  Denies SI/HI. Denies AVH. Pt for possible d/c on Thursday. A: Medications administered as prescribed.  Support, encouragement, and safe environment provided.  15-minute safety checks also continues. R: Pt was med compliant.  Safety checks continues

## 2014-10-02 NOTE — Discharge Instructions (Signed)
Serving Sizes What we call a serving size today is larger than it was in the past. A 1950s fast-food burger contained little more than 1 oz of meat, and a soft drink was 8 oz (1 cup). Today, a "quarter pounder" burger is at least 4 times that amount, and a 32 or 64 oz drink is not uncommon. A possible guide for eating when trying to lose weight is to eat about half as much as you normally do. Some estimates of serving sizes are:  1 Dairy serving:Individual container of yogurt (8 oz) or piece of cheese the size of your thumb (1 oz).  1 Grain serving: 1 slice of bread or  cup pasta.  1 Meat serving: The size of a deck of cards (3 oz).  1 Fruit serving: cup canned fruit or 1 medium fruit.  1 Vegetable serving:  cup of cooked or canned vegetables.  1 Fat serving:The size of 4 stacked dimes. Experts suggest spending 1 or 2 days measuring food portions you commonly eat. This will give you better practice at estimating serving sizes, and will also show whether you are eating an appropriate amount of food to meet your weight goals. If you find that you are eating more than you thought, try measuring your food for a few days so you can "reprogram" yourself to learn what makes a healthy portion for you. SUGGESTIONS FOR CONTROL  In restaurants, share entrees, or ask the waiter to put half the entre in a box or bag before you even touch it.  Order lunch-sized portions. Many restaurants serve 4 to 6 oz of meat at lunch, compared with 8 to 10 oz at dinner.  Split dessert or skip it all together. Have a piece of fruit when you get home.  At home, use smaller plates and bowls. It will look as if you are eating more.  Plate your food in the kitchen rather than serving it "family style" at the table.  Wait 20 to 30 minutes before taking seconds. This is how long it takes your brain to recognize that you are full.  Check food labels for serving sizes. Eat 1 serving only.  Use measuring cups and  spoons to see proper serving sizes.  Buy smaller packages of candy, popcorn, and snacks.  Avoid eating directly out of the bag or carton.  While eating half as much, exercise twice as much. Park further away from the mall, take the stairs instead of the escalator, and walk around your block. Losing weight is a slow, difficult process. It takes long-lasting lifestyle changes. You can make gradual changes over time so they become habits. Look to friends and family to support the healthy changes you are making. Avoid fad diets since they are often only temporary weight loss solutions. Document Released: 11/20/2002 Document Revised: 05/16/2011 Document Reviewed: 05/21/2013 ExitCare Patient Information 2015 [employer]. This information is not intended to replace advice given to you by your health care provider. Make sure you discuss any questions you have with your health care provider.  

## 2014-10-02 NOTE — Progress Notes (Signed)
  [facility] Adult Case Management Discharge Plan :  Will you be returning to the same living situation after discharge:  Yes,  home At discharge, do you have transportation home?: Yes,  family Do you have the ability to pay for your medications: Yes,  MCD  Release of information consent forms completed and in the chart;  Patient's signature needed at discharge.  Patient to Follow up at: Follow-up Information    Follow up with [facility].   Why:  Someone will see you the day after discharge.  Call them to see what time they will be by.   Contact information:   [address]  [address]  [place] [336] [phone]      Patient denies SI/HI: Yes,  yes    Aeronautical engineer and Suicide Prevention discussed: Yes,  yes  Have you used any form of tobacco in the last 30 days? (Cigarettes, Smokeless Tobacco, Cigars, and/or Pipes): Yes  Has patient been referred to the Quitline?: Patient refused referral  Ida Rogue 10/02/2014, 10:11 AM

## 2014-10-02 NOTE — Progress Notes (Signed)
Inpatient Diabetes Program Recommendations  AACE/ADA: New Consensus Statement on Inpatient Glycemic Control (2013)  Target Ranges:  Prepandial:   less than 140 mg/dL      Peak postprandial:   less than 180 mg/dL (1-2 hours)      Critically ill patients:  140 - 180 mg/dL   Results for [name], [name] (MRN [id number]) as of 10/02/2014 11:17  Ref. Range 09/30/2014 06:23  Hemoglobin A1C Latest Ref Range: 4.8-5.6 % 6.5 (H)     -Received referral for this patient- Reason: "HgbA1c 6.5%- patient states no prior history of DM- please evaluate to recommend treatment and disposition recommendations".  -Note RD saw patient earlier this AM and provided dietary recommendations for home.  -Note patient to follow-up with PCP after d/c to further evaluate glucose control at home.    MD- Since patient is obese and A1c is 6.5%, could start patient on low dose Metformin 500 mg bid with meals at home and have her follow-up with her PCP (may want to check to make sure non of her psychiatric meds are contraindicated with Metformin).  Would have RNs working with patient provide patient with written materials about DM from Exit Care notes.   Ambrose Finland RN, MSN, CDE Diabetes Coordinator Inpatient Glycemic Control Team Team Pager: [phone] (8a-5p)
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
## 2014-11-10 ENCOUNTER — Emergency Department (HOSPITAL_COMMUNITY)
Admission: EM | Admit: 2014-11-10 | Discharge: 2014-11-10 | Disposition: A | Discharge status: Home / Self Care | Payer: Medicare Other | Attending: Emergency Medicine | Admitting: Emergency Medicine

## 2014-11-10 ENCOUNTER — Encounter (HOSPITAL_COMMUNITY): Payer: Self-pay | Admitting: Emergency Medicine

## 2014-11-10 DIAGNOSIS — M2669 Other specified disorders of temporomandibular joint: Secondary | ICD-10-CM | POA: Diagnosis present

## 2014-11-10 DIAGNOSIS — G2402 Drug induced acute dystonia: Secondary | ICD-10-CM | POA: Insufficient documentation

## 2014-11-10 DIAGNOSIS — Z720 Tobacco use: Secondary | ICD-10-CM | POA: Insufficient documentation

## 2014-11-10 DIAGNOSIS — F419 Anxiety disorder, unspecified: Secondary | ICD-10-CM | POA: Diagnosis not present

## 2014-11-10 DIAGNOSIS — F42 Obsessive-compulsive disorder: Secondary | ICD-10-CM | POA: Insufficient documentation

## 2014-11-10 DIAGNOSIS — F319 Bipolar disorder, unspecified: Secondary | ICD-10-CM | POA: Diagnosis not present

## 2014-11-10 DIAGNOSIS — T434X5A Adverse effect of butyrophenone and thiothixene neuroleptics, initial encounter: Secondary | ICD-10-CM | POA: Insufficient documentation

## 2014-11-10 MED ORDER — DIPHENHYDRAMINE HCL 50 MG/ML IJ SOLN
50.0000 mg | Freq: Once | INTRAMUSCULAR | Status: AC
  Administered 2014-11-10: 50 mg via INTRAVENOUS
  Filled 2014-11-10: qty 1

## 2014-11-10 NOTE — Discharge Instructions (Signed)
You have suffered from a side effect from Haldol called a dystonic reaction. If symptoms recur you can take Benadryl 50 mg every 6 hours as needed. Contact your ACT team tomorrow to discuss dedication adjustment or possibly coming off of Haldol

## 2014-11-10 NOTE — ED Notes (Addendum)
Pt denies complaints at this time, states feeling better. No signs of distress.

## 2014-11-10 NOTE — ED Notes (Signed)
Pt with mouth pulling to right and slight head turn. Iv start benadryl given. Respirations easy non labored. Pt alert x4

## 2014-11-10 NOTE — ED Notes (Signed)
Pt c/o allergic reaction onset this morning on waking. Pt states that her jaw was "so tight I couldn't open it," pt took benadryl which improved symptoms some. Pt states jaw is still tight, face swollen, and tongue is numb. Pt unable to open mouth fully. Denies SOB or rash. Pt states she suspects haldol, which she took last night, although she has taken this medication previously without reaction.

## 2014-11-10 NOTE — ED Provider Notes (Signed)
CSN: [id number]     Arrival date & time 11/10/14  1335 History   First MD Initiated Contact with Patient 11/10/14 1347     Chief Complaint  Patient presents with  . Allergic Reaction     (Consider location/radiation/quality/duration/timing/severity/associated sxs/prior Treatment) HPI Patient reports that she's having allergic reaction to Haldol. She developed difficulty moving her jaw this morning plus a 1 hour after taking Haldol. She treated self with Benadryl 50 mrd prior to coming here. Symptoms are improving with time. She denies dyspnea denies difficulty swallowing. No other associated symptoms. Past Medical History  Diagnosis Date  . Bipolar affective   . OCD (obsessive compulsive disorder)   . Anxiety    History reviewed. No pertinent past surgical history. Family History  Problem Relation Age of Onset  . Drug abuse Father   . Mental illness Other    Social History  Substance Use Topics  . Smoking status: Current Every Day Smoker -- 1.00 packs/day for 1 years    Types: Cigarettes  . Smokeless tobacco: Never Used  . Alcohol Use: Yes     Comment: socially   OB History    No data available     Review of Systems  Constitutional: Negative.   HENT: Negative.        Feels as if jaw is locked  Respiratory: Negative.   Cardiovascular: Negative.   Gastrointestinal: Negative.   Musculoskeletal: Negative.   Skin: Negative.   Neurological: Negative.   Psychiatric/Behavioral: Negative.   All other systems reviewed and are negative.     Allergies  Depakote and Haldol  Home Medications   Prior to Admission medications   Medication Sig Start Date End Date Taking? Authorizing Provider  benztropine (COGENTIN) 1 MG tablet Take 1 tablet (1 mg total) by mouth 2 (two) times daily. Patient taking differently: Take 2 mg by mouth at bedtime.  10/02/14  Yes Beau Fanny, FNP  haloperidol (HALDOL) 5 MG tablet Take 1 tablet (5 mg total) by mouth 2 (two) times daily. Patient  taking differently: Take 5 mg by mouth every evening.  10/02/14  Yes Beau Fanny, FNP  LORazepam (ATIVAN) 1 MG tablet Take 1 tablet (1 mg total) by mouth 2 (two) times daily. To be titrated by outpatient provider at first appointment Patient taking differently: Take 1 mg by mouth 2 (two) times daily as needed for anxiety or sleep. To be titrated by outpatient provider at first appointment 10/02/14  Yes Beau Fanny, FNP  traZODone (DESYREL) 50 MG tablet Take 1 tablet (50 mg total) by mouth at bedtime and may repeat dose one time if needed. 10/02/14  Yes Beau Fanny, FNP  carbamazepine (TEGRETOL XR) 200 MG 12 hr tablet Take 1 tablet (200 mg total) by mouth 2 (two) times daily. Patient not taking: Reported on 11/10/2014 10/02/14   Beau Fanny, FNP  hydrOXYzine (ATARAX/VISTARIL) 25 MG tablet Take 1 tablet (25 mg total) by mouth every 4 (four) hours as needed for anxiety. Patient not taking: Reported on 11/10/2014 10/02/14   Beau Fanny, FNP   BP 154/88 mmHg  Pulse 100  Temp(Src) 97.7 F (36.5 C) (Oral)  Resp 18  SpO2 100% Physical Exam  Constitutional: She appears well-developed and well-nourished.  HENT:  Head: Normocephalic and atraumatic.  Eyes: Conjunctivae are normal. Pupils are equal, round, and reactive to light.  Neck: Neck supple. No tracheal deviation present. No thyromegaly present.  Cardiovascular: Normal rate and regular rhythm.   No murmur  heard. Pulmonary/Chest: Effort normal and breath sounds normal.  Abdominal: Soft. Bowel sounds are normal. She exhibits no distension. There is no tenderness.  Musculoskeletal: Normal range of motion. She exhibits no edema or tenderness.  Neurological: She is alert. Coordination normal.  Skin: Skin is warm and dry. No rash noted.  Psychiatric: She has a normal mood and affect.  Nursing note and vitals reviewed.   ED Course  Procedures (including critical care time) Labs Review Labs Reviewed - No data to display  Imaging  Review No results found. I have personally reviewed and evaluated these images and lab results as part of my medical decision-making.   EKG Interpretation None     3:15 PM patient asymptomatic after treatment with intravenous Benadryl. MDM  Plan she is to contact herACT team tomorrow to discuss medication adjustment and possibly coming off Haldol. If symptoms recur she can take Benadryl 50 mg every 6 hours Diagnosis dystonic reaction Final diagnoses:  None        Doug Sou, MD 11/10/14 1620

## 2014-12-05 DIAGNOSIS — E039 Hypothyroidism, unspecified: Secondary | ICD-10-CM | POA: Diagnosis not present

## 2014-12-05 DIAGNOSIS — N760 Acute vaginitis: Secondary | ICD-10-CM | POA: Diagnosis not present

## 2014-12-05 DIAGNOSIS — Z13228 Encounter for screening for other metabolic disorders: Secondary | ICD-10-CM | POA: Diagnosis not present

## 2014-12-05 DIAGNOSIS — B373 Candidiasis of vulva and vagina: Secondary | ICD-10-CM | POA: Diagnosis not present

## 2014-12-05 DIAGNOSIS — Z113 Encounter for screening for infections with a predominantly sexual mode of transmission: Secondary | ICD-10-CM | POA: Diagnosis not present

## 2014-12-25 DIAGNOSIS — E0590 Thyrotoxicosis, unspecified without thyrotoxic crisis or storm: Secondary | ICD-10-CM | POA: Diagnosis not present

## 2015-03-17 ENCOUNTER — Emergency Department (HOSPITAL_COMMUNITY)
Admission: EM | Admit: 2015-03-17 | Discharge: 2015-03-20 | Disposition: A | Discharge status: Other Acute Inpatient Hospital | Payer: Medicare Other | Attending: Emergency Medicine | Admitting: Emergency Medicine

## 2015-03-17 ENCOUNTER — Encounter (HOSPITAL_COMMUNITY): Payer: Self-pay | Admitting: Emergency Medicine

## 2015-03-17 DIAGNOSIS — F17210 Nicotine dependence, cigarettes, uncomplicated: Secondary | ICD-10-CM | POA: Diagnosis not present

## 2015-03-17 DIAGNOSIS — F3113 Bipolar disorder, current episode manic without psychotic features, severe: Secondary | ICD-10-CM | POA: Insufficient documentation

## 2015-03-17 DIAGNOSIS — Z3202 Encounter for pregnancy test, result negative: Secondary | ICD-10-CM | POA: Insufficient documentation

## 2015-03-17 DIAGNOSIS — R451 Restlessness and agitation: Secondary | ICD-10-CM | POA: Diagnosis not present

## 2015-03-17 DIAGNOSIS — F312 Bipolar disorder, current episode manic severe with psychotic features: Secondary | ICD-10-CM | POA: Diagnosis present

## 2015-03-17 DIAGNOSIS — R440 Auditory hallucinations: Secondary | ICD-10-CM | POA: Insufficient documentation

## 2015-03-17 DIAGNOSIS — R441 Visual hallucinations: Secondary | ICD-10-CM | POA: Insufficient documentation

## 2015-03-17 DIAGNOSIS — F39 Unspecified mood [affective] disorder: Secondary | ICD-10-CM | POA: Diagnosis not present

## 2015-03-17 LAB — COMPREHENSIVE METABOLIC PANEL
ALBUMIN: 4.5 g/dL (ref 3.5–5.0)
ALT: 16 U/L (ref 14–54)
ANION GAP: 11 (ref 5–15)
AST: 22 U/L (ref 15–41)
Alkaline Phosphatase: 61 U/L (ref 38–126)
BUN: 18 mg/dL (ref 6–20)
CO2: 19 mmol/L — AB (ref 22–32)
Calcium: 9.1 mg/dL (ref 8.9–10.3)
Chloride: 107 mmol/L (ref 101–111)
Creatinine, Ser: 1.04 mg/dL — ABNORMAL HIGH (ref 0.44–1.00)
GFR calc non Af Amer: >60 mL/min (ref >60)
GLUCOSE: 127 mg/dL — AB (ref 65–99)
POTASSIUM: 3.9 mmol/L (ref 3.5–5.1)
SODIUM: 137 mmol/L (ref 135–145)
TOTAL PROTEIN: 9.1 g/dL — AB (ref 6.5–8.1)
Total Bilirubin: 0.9 mg/dL (ref 0.3–1.2)

## 2015-03-17 LAB — CBC
HEMATOCRIT: 36 % (ref 36.0–46.0)
Hemoglobin: 11.8 g/dL — ABNORMAL LOW (ref 12.0–15.0)
MCH: 27.3 pg (ref 26.0–34.0)
MCHC: 32.8 g/dL (ref 30.0–36.0)
MCV: 83.1 fL (ref 78.0–100.0)
Platelets: 392 10*3/uL (ref 150–400)
RBC: 4.33 MIL/uL (ref 3.87–5.11)
RDW: 15.1 % (ref 11.5–15.5)
WBC: 9.7 10*3/uL (ref 4.0–10.5)

## 2015-03-17 LAB — URINALYSIS, ROUTINE W REFLEX MICROSCOPIC
BILIRUBIN URINE: NEGATIVE
Glucose, UA: NEGATIVE mg/dL
Hgb urine dipstick: NEGATIVE
KETONES UR: NEGATIVE mg/dL
Leukocytes, UA: NEGATIVE
NITRITE: NEGATIVE
Protein, ur: NEGATIVE mg/dL
Specific Gravity, Urine: 1.027 (ref 1.005–1.030)
pH: 6 (ref 5.0–8.0)

## 2015-03-17 LAB — POC URINE PREG, ED: PREG TEST UR: NEGATIVE

## 2015-03-17 LAB — RAPID URINE DRUG SCREEN, HOSP PERFORMED
Amphetamines: NOT DETECTED
BENZODIAZEPINES: NOT DETECTED
Barbiturates: NOT DETECTED
COCAINE: NOT DETECTED
OPIATES: NOT DETECTED
TETRAHYDROCANNABINOL: NOT DETECTED

## 2015-03-17 LAB — ETHANOL: Alcohol, Ethyl (B): <5 mg/dL (ref <5)

## 2015-03-17 LAB — ACETAMINOPHEN LEVEL

## 2015-03-17 LAB — SALICYLATE LEVEL

## 2015-03-17 MED ORDER — LORAZEPAM 0.5 MG PO TABS
0.5000 mg | ORAL_TABLET | Freq: Two times a day (BID) | ORAL | Status: DC
  Administered 2015-03-17: 0.5 mg via ORAL
  Filled 2015-03-17 (×2): qty 1

## 2015-03-17 MED ORDER — CARBAMAZEPINE 200 MG PO TABS
200.0000 mg | ORAL_TABLET | Freq: Every day | ORAL | Status: DC
  Filled 2015-03-17 (×2): qty 1

## 2015-03-17 MED ORDER — LORAZEPAM 2 MG/ML IJ SOLN
1.0000 mg | Freq: Once | INTRAMUSCULAR | Status: AC
  Administered 2015-03-17: 1 mg via INTRAMUSCULAR
  Filled 2015-03-17: qty 1

## 2015-03-17 MED ORDER — STERILE WATER FOR INJECTION IJ SOLN
INTRAMUSCULAR | Status: AC
  Administered 2015-03-17: 10 mL
  Filled 2015-03-17: qty 10

## 2015-03-17 MED ORDER — ZIPRASIDONE MESYLATE 20 MG IM SOLR
10.0000 mg | Freq: Once | INTRAMUSCULAR | Status: AC
  Administered 2015-03-17: 10 mg via INTRAMUSCULAR
  Filled 2015-03-17: qty 20

## 2015-03-17 MED ORDER — IBUPROFEN 200 MG PO TABS: 600.0000 mg | ORAL_TABLET | Freq: Three times a day (TID) | ORAL | Status: DC | PRN

## 2015-03-17 MED ORDER — ACETAMINOPHEN 325 MG PO TABS: 650.0000 mg | ORAL_TABLET | ORAL | Status: DC | PRN

## 2015-03-17 MED ORDER — BENZTROPINE MESYLATE 1 MG PO TABS
1.0000 mg | ORAL_TABLET | Freq: Two times a day (BID) | ORAL | Status: DC
  Administered 2015-03-19: 1 mg via ORAL
  Filled 2015-03-17 (×6): qty 1

## 2015-03-17 MED ORDER — LORAZEPAM 1 MG PO TABS
1.0000 mg | ORAL_TABLET | Freq: Three times a day (TID) | ORAL | Status: DC | PRN
  Administered 2015-03-18 – 2015-03-19 (×2): 1 mg via ORAL
  Filled 2015-03-17 (×2): qty 1

## 2015-03-17 MED ORDER — DIPHENHYDRAMINE HCL 50 MG/ML IJ SOLN
25.0000 mg | Freq: Once | INTRAMUSCULAR | Status: AC
Start: 2015-03-17 — End: 2015-03-17
  Administered 2015-03-17: 25 mg via INTRAMUSCULAR
  Filled 2015-03-17: qty 1

## 2015-03-17 MED ORDER — RISPERIDONE 2 MG PO TBDP
3.0000 mg | ORAL_TABLET | Freq: Every day | ORAL | Status: DC
  Filled 2015-03-17 (×4): qty 1

## 2015-03-17 MED ORDER — LORAZEPAM 1 MG PO TABS: 1.0000 mg | ORAL_TABLET | Freq: Once | ORAL | Status: AC

## 2015-03-17 NOTE — ED Provider Notes (Signed)
CSN: [id number]     Arrival date & time 03/17/15  1354 History   First MD Initiated Contact with Patient 03/17/15 1407     Chief Complaint  Patient presents with  . Manic Behavior  . Medical Clearance   HPI  [name] is a 40 year old female with PMHx of bipolar, OCD and anxiety presenting with IVC paperwork. Patient was brought in by her family due to untreated bipolar disorder. Patient is supposed be taking Abilify but has not been taking her medications. Patient calls the police department repeatedly because she has nightmares at night. Her family reports that she is becoming increasingly hostile towards them. They believe that she is a danger to herself and to others. Patient has history of multiple emergency department visits for similar presentations. She is uncooperative with the interview. She states she has emotional pain only. She endorses depression but denies intent to self-harm. Denies homicidal ideation. Endorses auditory and visual hallucinations. She states that she sees ghosts at night. She states that she also hears voices but will not elaborate home who they are what they say her.  Past Medical History  Diagnosis Date  . Bipolar affective (HCC)   . OCD (obsessive compulsive disorder)   . Anxiety    History reviewed. No pertinent past surgical history. Family History  Problem Relation Age of Onset  . Drug abuse Father   . Mental illness Other    Social History  Substance Use Topics  . Smoking status: Current Every Day Smoker -- 1.00 packs/day for 1 years    Types: Cigarettes  . Smokeless tobacco: Never Used  . Alcohol Use: Yes     Comment: socially   OB History    No data available     Review of Systems  Psychiatric/Behavioral: Positive for hallucinations, behavioral problems, dysphoric mood and agitation. Negative for self-injury.  All other systems reviewed and are negative.     Allergies  Depakote and Haldol  Home Medications   Prior to Admission  medications   Medication Sig Start Date End Date Taking? Authorizing Provider  benztropine (COGENTIN) 1 MG tablet Take 1 tablet (1 mg total) by mouth 2 (two) times daily. Patient not taking: Reported on 03/17/2015 10/02/14   Beau Fanny, FNP  carbamazepine (TEGRETOL XR) 200 MG 12 hr tablet Take 1 tablet (200 mg total) by mouth 2 (two) times daily. Patient not taking: Reported on 03/17/2015 10/02/14   Beau Fanny, FNP  haloperidol (HALDOL) 5 MG tablet Take 1 tablet (5 mg total) by mouth 2 (two) times daily. Patient not taking: Reported on 03/17/2015 10/02/14   Beau Fanny, FNP  hydrOXYzine (ATARAX/VISTARIL) 25 MG tablet Take 1 tablet (25 mg total) by mouth every 4 (four) hours as needed for anxiety. Patient not taking: Reported on 11/10/2014 10/02/14   Beau Fanny, FNP  LORazepam (ATIVAN) 1 MG tablet Take 1 tablet (1 mg total) by mouth 2 (two) times daily. To be titrated by outpatient provider at first appointment Patient not taking: Reported on 03/17/2015 10/02/14   Beau Fanny, FNP  traZODone (DESYREL) 50 MG tablet Take 1 tablet (50 mg total) by mouth at bedtime and may repeat dose one time if needed. Patient not taking: Reported on 03/17/2015 10/02/14   Beau Fanny, FNP   BP 146/93 mmHg  Pulse 78  Temp(Src) 99.2 F (37.3 C) (Oral)  Resp 16  SpO2 97% Physical Exam  Constitutional: She appears well-developed and well-nourished. No distress.  HENT:  Head: Normocephalic and atraumatic.  Eyes: Conjunctivae are normal. Right eye exhibits no discharge. Left eye exhibits no discharge. No scleral icterus.  Neck: Normal range of motion.  Cardiovascular: Normal rate and regular rhythm.   Pulmonary/Chest: Effort normal. No respiratory distress.  Abdominal: Soft. There is no tenderness.  Musculoskeletal: Normal range of motion.  Neurological: She is alert. Coordination normal.  Skin: Skin is warm and dry.  Psychiatric: Her affect is angry and blunt. Her speech is rapid and/or pressured.  She is agitated. She expresses no homicidal and no suicidal ideation.  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 Actually uncooperative with most line of questioning. Repeatedly states "what" to all questions. Pacing about room. Endorses auditory and visual hallucinations. Endorses depressed mood. Denies SI or HI.  Nursing note and vitals reviewed.   ED Course  Procedures (including critical care time) Labs Review Labs Reviewed  COMPREHENSIVE METABOLIC PANEL - Abnormal; Notable for the following:    CO2 19 (*)    Glucose, Bld 127 (*)    Creatinine, Ser 1.04 (*)    Total Protein 9.1 (*)    All other components within normal limits  ACETAMINOPHEN LEVEL - Abnormal; Notable for the following:    Acetaminophen (Tylenol), Serum <10 (*)    All other components within normal limits  CBC - Abnormal; Notable for the following:    Hemoglobin 11.8 (*)    All other components within normal limits  URINALYSIS, ROUTINE W REFLEX MICROSCOPIC (NOT AT [facility]) - Abnormal; Notable for the following:    APPearance CLOUDY (*)    All other components within normal limits  ETHANOL  SALICYLATE LEVEL  URINE RAPID DRUG SCREEN, HOSP PERFORMED  TSH  CARBAMAZEPINE LEVEL, TOTAL  POC URINE PREG, ED    Imaging Review No results found. I have personally reviewed and evaluated these images and lab results as part of my medical decision-making.   EKG Interpretation None      MDM   Final diagnoses:  Mood disorder [facility](HCC)   Patient presenting with medication noncompliance and IVC paperwork. Patient has no complaints at this time. VSS. Benign physical exam. Lab work unremarkable. Patient is medically cleared for psychiatric evaluation will be transferred to the psych ED. TTS consulted, home meds and psych holding orders placed. Patient care signed out to oncoming provider to follow up pending tegretol level.      Alveta HeimlichStevi Jamey Demchak, PA-C 03/17/15 1559  Doug SouSam Jacubowitz, MD 03/17/15 1610

## 2015-03-17 NOTE — ED Notes (Signed)
Pt refused recheck of vital signs.  No distress noted, resting comfortably.

## 2015-03-17 NOTE — ED Notes (Signed)
Patient ambulated back to the unit from Triage.  Started yelling and cursing as soon as she got onto the unit.  Stated that she was black and we were white and needed to do whatever she asked.  She was given a drink and a snack.  Continued to rant and rave, slamming the glass at the nurses station and yelling in a threatening manner.  Discussed case with Julieanne CottonJosephine, NP and received orders for ziprasidone 10 mg, lorazepam 1 mg, and diphenhydramine 25 mg IM.  All 3 injections given.  Police were present, but patient did not have to be held.  She is currently a little bit quieter, but remains rude and disrespectful.

## 2015-03-17 NOTE — ED Notes (Signed)
Pt fustrated that she is IVC, charge explained that because pt is IVC, the only person that can say the pt can go home is the psychaitrist. Charge explained the steps to be able to see the psychaitrist. Pt initially refused to have blood work, but then agreed. Pt calmly let charge draw blood work. Pt changed into paper scrubs and gave a urine sample. Pt denies SI/HI, AH/VH.   Pt has some flight of ideas, while charge was drawing blood work pt asked "do you know who Peyton NajjarKim Kardashian is? She is a hoe." then pt started talking about reading the Bible.

## 2015-03-17 NOTE — ED Provider Notes (Signed)
Care assumed at sign out from [facility]. Pt here with manic behavior, has already had TTS consultation, plan was to f/up on Tegretol level and as long as it was not elevated then she was medically cleared. If elevated, plan to potentially admit based on level. Awaiting this lab to be drawn.  Physical Exam  BP 146/93 mmHg  Pulse 78  Temp(Src) 99.2 F (37.3 C) (Oral)  Resp 16  SpO2 97%  Physical Exam  ED Course  Procedures Results for orders placed or performed during the hospital encounter of 03/17/15  Comprehensive metabolic panel  Result Value Ref Range   Sodium 137 135 - 145 mmol/L   Potassium 3.9 3.5 - 5.1 mmol/L   Chloride 107 101 - 111 mmol/L   CO2 19 (L) 22 - 32 mmol/L   Glucose, Bld 127 (H) 65 - 99 mg/dL   BUN 18 6 - 20 mg/dL   Creatinine, Ser 1.61 (H) 0.44 - 1.00 mg/dL   Calcium 9.1 8.9 - 09.6 mg/dL   Total Protein 9.1 (H) 6.5 - 8.1 g/dL   Albumin 4.5 3.5 - 5.0 g/dL   AST 22 15 - 41 U/L   ALT 16 14 - 54 U/L   Alkaline Phosphatase 61 38 - 126 U/L   Total Bilirubin 0.9 0.3 - 1.2 mg/dL   GFR calc non Af Amer >60 >60 mL/min   GFR calc Af Amer >60 >60 mL/min   Anion gap 11 5 - 15  Ethanol (ETOH)  Result Value Ref Range   Alcohol, Ethyl (B) <5 <5 mg/dL  Salicylate level  Result Value Ref Range   Salicylate Lvl <4.0 2.8 - 30.0 mg/dL  Acetaminophen level  Result Value Ref Range   Acetaminophen (Tylenol), Serum <10 (L) 10 - 30 ug/mL  CBC  Result Value Ref Range   WBC 9.7 4.0 - 10.5 K/uL   RBC 4.33 3.87 - 5.11 MIL/uL   Hemoglobin 11.8 (L) 12.0 - 15.0 g/dL   HCT 04.5 40.9 - 81.1 %   MCV 83.1 78.0 - 100.0 fL   MCH 27.3 26.0 - 34.0 pg   MCHC 32.8 30.0 - 36.0 g/dL   RDW 91.4 78.2 - 95.6 %   Platelets 392 150 - 400 K/uL  Urine rapid drug screen (hosp performed) (Not at [facility])  Result Value Ref Range   Opiates NONE DETECTED NONE DETECTED   Cocaine NONE DETECTED NONE DETECTED   Benzodiazepines NONE DETECTED NONE DETECTED   Amphetamines NONE DETECTED NONE  DETECTED   Tetrahydrocannabinol NONE DETECTED NONE DETECTED   Barbiturates NONE DETECTED NONE DETECTED  Urinalysis, Routine w reflex microscopic (not at [facility])  Result Value Ref Range   Color, Urine YELLOW YELLOW   APPearance CLOUDY (A) CLEAR   Specific Gravity, Urine 1.027 1.005 - 1.030   pH 6.0 5.0 - 8.0   Glucose, UA NEGATIVE NEGATIVE mg/dL   Hgb urine dipstick NEGATIVE NEGATIVE   Bilirubin Urine NEGATIVE NEGATIVE   Ketones, ur NEGATIVE NEGATIVE mg/dL   Protein, ur NEGATIVE NEGATIVE mg/dL   Nitrite NEGATIVE NEGATIVE   Leukocytes, UA NEGATIVE NEGATIVE  Carbamazepine level, total  Result Value Ref Range   Carbamazepine Lvl <2.0 (L) 4.0 - 12.0 ug/mL  POC urine preg, ED (not at [facility])  Result Value Ref Range   Preg Test, Ur NEGATIVE NEGATIVE   No results found.   Meds ordered this encounter  Medications  . ibuprofen (ADVIL,MOTRIN) tablet 600 mg    Sig:   . acetaminophen (TYLENOL) tablet  650 mg    Sig:   . LORazepam (ATIVAN) tablet 1 mg    Sig:   . ziprasidone (GEODON) injection 10 mg    Sig:   . diphenhydrAMINE (BENADRYL) injection 25 mg    Sig:   . LORazepam (ATIVAN) tablet 1 mg    Sig:    Or  . LORazepam (ATIVAN) injection 1 mg    Sig:   . sterile water (preservative free) injection    Sig:     [name], [name]   : cabinet override  . LORazepam (ATIVAN) tablet 0.5 mg    Sig:   . carbamazepine (TEGRETOL) tablet 200 mg    Sig:   . risperiDONE (RISPERDAL M-TABS) disintegrating tablet 3 mg    Sig:   . benztropine (COGENTIN) tablet 1 mg    Sig:      MDM:   ICD-9-CM ICD-10-CM   1. Mood disorder (HCC) 296.90 F39     6:08 PM Tegretol level still not drawn, asked charge to verify that this has not been forgotten. Per Coralyn MarkFaith-Marie, [facility] staff stating pt is asleep and that lab will collect it later when she's awake.  11:30 PM Tegretol level still not resulted, but does appear to be in process. Asked charge to find out that this has not been lost or discontinued for  some reason, she called lab and they stated it was a send-out and will result in the next few hours. Will continue to await this result.   12:22 AM Tegretol level <2, which is consistent with her not taking her medications. Pt medically cleared. No further actions need to be performed, home meds including tegretol have been ordered by prior PA. Please see [facility] notes for ongoing documentation of care and dispo.  [employer] [place] Camprubi-Soms, PA-C 03/18/15 0024  Linwood DibblesJon Knapp, MD 03/18/15 1510

## 2015-03-17 NOTE — ED Notes (Signed)
Phlebotomy at bedside, pt refused lab draw.

## 2015-03-17 NOTE — ED Notes (Signed)
IVC paperwork: "THE RESPONDENT HAS BEEN DIANOSED AS BIPOLAR DISORDER/MANIC. SHE IS NOT TAKING HER MEDICINES, WHICH CONSIST OF ABILIFY AND OTHER MEDS. THE RESPONDENT HAS BEEN MAKING STATEMENTS THAT SHE CAN'T TAKE IT ANYMORE AND THERE IS NOTHING ELSE SHE CAN DO. THE RESPONDENT IS HEARING VOICES, CALLING 911 BECAUSE OF [place]. THE RESPONDENT HAS BECOME INCREASINGLY HOSTILE AND AGGRESSIVE TOWARDS FAMILY MEMBERS. IN THE RESPONDENTS PRESENT STATE SHE IS A DANGER TO HERSELF AND TO OTHERS."  Presents screaming, laughing hysterically, and making hostile statements towards the police officers. Currently handcuffed to chair in triage

## 2015-03-17 NOTE — ED Provider Notes (Signed)
Patient under involuntary psychiatric commitment. Reportedly has been noncompliant with her medications.. Patient is pacing about yelling loudly highly agitated. Results for orders placed or performed during the hospital encounter of 03/17/15  Comprehensive metabolic panel  Result Value Ref Range   Sodium 137 135 - 145 mmol/L   Potassium 3.9 3.5 - 5.1 mmol/L   Chloride 107 101 - 111 mmol/L   CO2 19 (L) 22 - 32 mmol/L   Glucose, Bld 127 (H) 65 - 99 mg/dL   BUN 18 6 - 20 mg/dL   Creatinine, Ser 0.981.04 (H) 0.44 - 1.00 mg/dL   Calcium 9.1 8.9 - 11.910.3 mg/dL   Total Protein 9.1 (H) 6.5 - 8.1 g/dL   Albumin 4.5 3.5 - 5.0 g/dL   AST 22 15 - 41 U/L   ALT 16 14 - 54 U/L   Alkaline Phosphatase 61 38 - 126 U/L   Total Bilirubin 0.9 0.3 - 1.2 mg/dL   GFR calc non Af Amer >60 >60 mL/min   GFR calc Af Amer >60 >60 mL/min   Anion gap 11 5 - 15  Ethanol (ETOH)  Result Value Ref Range   Alcohol, Ethyl (B) <5 <5 mg/dL  Salicylate level  Result Value Ref Range   Salicylate Lvl <4.0 2.8 - 30.0 mg/dL  Acetaminophen level  Result Value Ref Range   Acetaminophen (Tylenol), Serum <10 (L) 10 - 30 ug/mL  CBC  Result Value Ref Range   WBC 9.7 4.0 - 10.5 K/uL   RBC 4.33 3.87 - 5.11 MIL/uL   Hemoglobin 11.8 (L) 12.0 - 15.0 g/dL   HCT 14.736.0 82.936.0 - 56.246.0 %   MCV 83.1 78.0 - 100.0 fL   MCH 27.3 26.0 - 34.0 pg   MCHC 32.8 30.0 - 36.0 g/dL   RDW 13.015.1 86.511.5 - 78.415.5 %   Platelets 392 150 - 400 K/uL  Urine rapid drug screen (hosp performed) (Not at [facility])  Result Value Ref Range   Opiates NONE DETECTED NONE DETECTED   Cocaine NONE DETECTED NONE DETECTED   Benzodiazepines NONE DETECTED NONE DETECTED   Amphetamines NONE DETECTED NONE DETECTED   Tetrahydrocannabinol NONE DETECTED NONE DETECTED   Barbiturates NONE DETECTED NONE DETECTED  Urinalysis, Routine w reflex microscopic (not at [facility])  Result Value Ref Range   Color, Urine YELLOW YELLOW   APPearance CLOUDY (A) CLEAR   Specific Gravity, Urine 1.027 1.005 -  1.030   pH 6.0 5.0 - 8.0   Glucose, UA NEGATIVE NEGATIVE mg/dL   Hgb urine dipstick NEGATIVE NEGATIVE   Bilirubin Urine NEGATIVE NEGATIVE   Ketones, ur NEGATIVE NEGATIVE mg/dL   Protein, ur NEGATIVE NEGATIVE mg/dL   Nitrite NEGATIVE NEGATIVE   Leukocytes, UA NEGATIVE NEGATIVE  POC urine preg, ED (not [facility])  Result Value Ref Range   Preg Test, Ur NEGATIVE NEGATIVE   No results found.   Doug SouSam Timica Marcom, MD 03/17/15 [phone]

## 2015-03-17 NOTE — ED Notes (Signed)
Pt sleeping at present, no distress noted, arouseable to verbal stimuli.  Monitoring for safety, Q 15 min checks in effect. 

## 2015-03-17 NOTE — BH Assessment (Addendum)
Assessment Note  [name] is an 40 y.o. female with history of Bipolar Affective Disorder, OCD, and Anxiety. Per ED notes, "Patient brought in by her family due to untreated bipolar disorder. Patient is supposed be taking Abilify but has not been taking her medications. Patient calls the police department repeatedly because she has nightmares at night. Her family reports that she is becoming increasingly hostile towards them. They believe that she is a danger to herself and to others. Patient has history of multiple emergency department visits for similar presentations. She is uncooperative with the interview. She states she has emotional pain only. She endorses depression but denies intent to self-harm. Denies homicidal ideation. Endorses auditory and visual hallucinations. She states that she sees ghosts at night. She states that she also hears voices but will not elaborate home who they are what they say her."  Writer attempted to assess patient. Patient ignored this Clinical research associate making no comment. Patient later approached this writer stating, "Can I speak to the African doctor", referring to the Nurse Practitioner on duty today. Writer again asked patient if she would agree to complete an assessment. Patient did agree and we went to her room. Patient sts, "I hate black people". She further reports that she is Albino and hates her family because, "They turned they back on me". Patient stood up and walked out the room. She refused to answer any further questions.   Writer is unable to confirm/deny if patient is suicidal or homicidal. Clinical research associate is unable to confirm if patient is experiencing any AVH's. Patient does seek outpatient services with [name]. Contacted this provider who reported that patient was doing well until this past Friday. Staff noticed that patient was increasing irritated and manic. Sts that she would yell and then quickly calm down. She was also called her therapist  excessively asking staff to bring her medications.   Patient has received inpatient treatment at [name] in the past.   Diagnosis: Bipolar Affective, OCD, and Anxiety  Past Medical History:  Past Medical History  Diagnosis Date  . Bipolar affective (HCC)   . OCD (obsessive compulsive disorder)   . Anxiety     History reviewed. No pertinent past surgical history.  Family History:  Family History  Problem Relation Age of Onset  . Drug abuse Father   . Mental illness Other     Social History:  reports that she has been smoking Cigarettes.  She has a 1 pack-year smoking history. She has never used smokeless tobacco. She reports that she drinks alcohol. She reports that she uses illicit drugs (Marijuana).  Additional Social History:  Alcohol / Drug Use Pain Medications: unk Prescriptions: unk Over the Counter: unk History of alcohol / drug use?:  (unk)  CIWA: CIWA-Ar BP: 146/93 mmHg Pulse Rate: 78 COWS:    Allergies:  Allergies  Allergen Reactions  . Depakote [Divalproex Sodium] Other (See Comments)    "Knocks me out." "knocked out"  . Haldol [Haloperidol] Swelling and Other (See Comments)    Patient has been taking regularly with out reaction until today thinks its this medication that's causing the reaction    Home Medications:  (Not in a hospital admission)  OB/GYN Status:  No LMP recorded.  General Assessment Data Location of Assessment: [facility] TTS Assessment: In system Is this a Tele or Face-to-Face Assessment?: Face-to-Face Is this an Initial Assessment or a Re-assessment for this encounter?: Initial Assessment Marital status: Single [name] name:  [name]) Is patient pregnant?: No Pregnancy  Status: No Living Arrangements: Other (Comment), Non-relatives/Friends (lives with a roomate) Can pt return to current living arrangement?: Yes Admission Status: Involuntary Is patient capable of signing voluntary admission?: No Referral Source:  Self/Family/Friend Insurance type:  (MCR/MCD)     Crisis Care Plan Living Arrangements: Other (Comment), Non-relatives/Friends (lives with a Restaurant manager, fast foodroomate) Legal Guardian: Other: (unk) Name of Psychiatrist:  Producer, television/film/video(Barbara ) Name of Therapist:  Producer, television/film/video(Barbara )  Education Status Is patient currently in school?: No Current Grade:  (n/a) Highest grade of school patient has completed:  (n/a) Name of school:  (n/a) Contact person:  (n/a)  Risk to self with the past 6 months Suicidal Ideation:  (unk) Has patient been a risk to self within the past 6 months prior to admission? :  (unk) Suicidal Intent:  (unk) Has patient had any suicidal intent within the past 6 months prior to admission? :  (unk) Is patient at risk for suicide?:  (unk) Suicidal Plan?:  (unk) Has patient had any suicidal plan within the past 6 months prior to admission? :  (unk) Access to Means:  (unk) What has been your use of drugs/alcohol within the last 12 months?:  (unk) Previous Attempts/Gestures:  (unk) How many times?:  (unk) Other Self Harm Risks:  (unk) Triggers for Past Attempts: Unknown Intentional Self Injurious Behavior:  (unk) Family Suicide History: Unknown Recent stressful life event(s):  (Sts, "My family turned against me") Persecutory voices/beliefs?: No Depression: No Depression Symptoms:  (patient denies ) Substance abuse history and/or treatment for substance abuse?:  (n/a) Suicide prevention information given to non-admitted patients: Not applicable  Risk to Others within the past 6 months Homicidal Ideation:  (unk) Does patient have any lifetime risk of violence toward others beyond the six months prior to admission? : Unknown Thoughts of Harm to Others:  (Unk) Current Homicidal Intent:  (unk) Current Homicidal Plan:  (unk) Access to Homicidal Means:  (unk) Identified Victim:  (unk) History of harm to others?:  (unk) Assessment of Violence:  (unk) Violent Behavior Description:  (patient is calm  and cooperaive ) Does patient have access to weapons?: No Criminal Charges Pending?: No Does patient have a court date: No Is patient on probation?: No  Psychosis Hallucinations:  (unk) Delusions:  (unk)  Mental Status Report Appearance/Hygiene: In scrubs Eye Contact: Poor Motor Activity: Hyperactivity, Restlessness, Agitation Speech: Rapid, Pressured Level of Consciousness: Restless, Irritable Mood: Anxious, Suspicious, Apprehensive, Irritable, Preoccupied, Threatening Affect: Apprehensive, Irritable Anxiety Level:  (unk) Thought Processes: Flight of Ideas, Unable to Assess Judgement: Unable to Assess Orientation: Unable to assess Obsessive Compulsive Thoughts/Behaviors: None  Cognitive Functioning Concentration: Unable to Assess Memory: Unable to Assess IQ: Average Insight: Unable to Assess Impulse Control: Poor Appetite:  (unk) Weight Loss:  (unk) Weight Gain:  (unk) Sleep: Unable to Assess Total Hours of Sleep:  (unk) Vegetative Symptoms:  (unk)  ADLScreening [facility] Assessment Services) Patient's cognitive ability adequate to safely complete daily activities?: No Patient able to express need for assistance with ADLs?: Yes Independently performs ADLs?: Yes (appropriate for developmental age)  Prior Inpatient Therapy Prior Inpatient Therapy: Yes Prior Therapy Dates:  ([facility]-unk; [facility]-Unk) Prior Therapy Facilty/Provider(s):  [facility] and [facility]) Reason for Treatment:  (Bipolar Disorder, Med Mgmt, Manic)  Prior Outpatient Therapy Prior Outpatient Therapy: Yes Prior Therapy Dates:  (current ) Prior Therapy Facilty/Provider(s):  [facility](Barbara ) Reason for Treatment:  (ACT services) Does patient have an ACCT team?: Yes Does patient have Intensive In-House Services?  : No Does patient have [facility] services? :  No Does patient have P4CC services?: No  ADL Screening (condition at time of admission) Patient's cognitive ability adequate to safely complete daily  activities?: No Is the patient deaf or have difficulty hearing?: Yes Does the patient have difficulty seeing, even when wearing glasses/contacts?: No Does the patient have difficulty concentrating, remembering, or making decisions?: No Patient able to express need for assistance with ADLs?: Yes Does the patient have difficulty dressing or bathing?: No Independently performs ADLs?: Yes (appropriate for developmental age) Does the patient have difficulty walking or climbing stairs?: No Weakness of Legs: None Weakness of Arms/Hands: None  Home Assistive Devices/Equipment Home Assistive Devices/Equipment: None    Abuse/Neglect Assessment (Assessment to be complete while patient is alone) Physical Abuse: Denies Verbal Abuse: Denies Sexual Abuse: Denies Exploitation of patient/patient's resources: Denies Self-Neglect: Denies Values / Beliefs Cultural Requests During Hospitalization: None Spiritual Requests During Hospitalization: None   Advance Directives (For Healthcare) Does patient have an advance directive?: No Would patient like information on creating an advanced directive?: No - patient declined information    Additional Information 1:1 In Past 12 Months?: No CIRT Risk: No Elopement Risk: No Does patient have medical clearance?: Yes     Disposition:  Disposition Initial Assessment Completed for this Encounter: Yes Disposition of Patient: Inpatient treatment program Julieanne Cotton, NP recommends inpatient treatment. TTS to seek ) Type of inpatient treatment program: Adult (Inpatient )  On Site Evaluation by:   Reviewed with Physician:    Melynda Ripple [facility] 03/17/2015 4:48 PM

## 2015-03-17 NOTE — ED Notes (Signed)
Pt refusing to take prescribed meds, stating they are poison.

## 2015-03-18 DIAGNOSIS — F3113 Bipolar disorder, current episode manic without psychotic features, severe: Secondary | ICD-10-CM | POA: Diagnosis not present

## 2015-03-18 DIAGNOSIS — F312 Bipolar disorder, current episode manic severe with psychotic features: Secondary | ICD-10-CM | POA: Diagnosis not present

## 2015-03-18 DIAGNOSIS — F319 Bipolar disorder, unspecified: Secondary | ICD-10-CM | POA: Insufficient documentation

## 2015-03-18 LAB — CARBAMAZEPINE LEVEL, TOTAL: Carbamazepine Lvl: <2 ug/mL — ABNORMAL LOW (ref 4.0–12.0)

## 2015-03-18 MED ORDER — DIPHENHYDRAMINE HCL 50 MG/ML IJ SOLN
25.0000 mg | Freq: Once | INTRAMUSCULAR | Status: AC
Start: 2015-03-18 — End: 2015-03-18
  Administered 2015-03-18: 25 mg via INTRAMUSCULAR
  Filled 2015-03-18: qty 1

## 2015-03-18 MED ORDER — DIPHENHYDRAMINE HCL 50 MG/ML IJ SOLN
50.0000 mg | Freq: Once | INTRAMUSCULAR | Status: AC
  Administered 2015-03-18: 50 mg via INTRAMUSCULAR
  Filled 2015-03-18: qty 1

## 2015-03-18 MED ORDER — MENTHOL 3 MG MT LOZG
1.0000 | LOZENGE | OROMUCOSAL | Status: DC | PRN
Start: 2015-03-18 — End: 2015-03-20
  Administered 2015-03-19 – 2015-03-20 (×3): 3 mg via ORAL
  Filled 2015-03-18: qty 9

## 2015-03-18 MED ORDER — LORAZEPAM 2 MG/ML IJ SOLN
2.0000 mg | Freq: Once | INTRAMUSCULAR | Status: AC
  Administered 2015-03-18: 2 mg via INTRAMUSCULAR
  Filled 2015-03-18: qty 1

## 2015-03-18 MED ORDER — STERILE WATER FOR INJECTION IJ SOLN
INTRAMUSCULAR | Status: AC
  Filled 2015-03-18: qty 10

## 2015-03-18 MED ORDER — ZIPRASIDONE MESYLATE 20 MG IM SOLR
10.0000 mg | Freq: Once | INTRAMUSCULAR | Status: AC
  Administered 2015-03-18: 10 mg via INTRAMUSCULAR
  Filled 2015-03-18: qty 20

## 2015-03-18 MED ORDER — LORAZEPAM 1 MG PO TABS: 1.0000 mg | ORAL_TABLET | Freq: Once | ORAL | Status: AC

## 2015-03-18 MED ORDER — LORAZEPAM 2 MG/ML IJ SOLN
1.0000 mg | Freq: Once | INTRAMUSCULAR | Status: AC
  Administered 2015-03-18: 1 mg via INTRAMUSCULAR
  Filled 2015-03-18: qty 1

## 2015-03-18 MED ORDER — STERILE WATER FOR INJECTION IJ SOLN
INTRAMUSCULAR | Status: AC
  Administered 2015-03-18: 1.2 mL
  Filled 2015-03-18: qty 10

## 2015-03-18 MED ORDER — ZIPRASIDONE MESYLATE 20 MG IM SOLR
20.0000 mg | Freq: Once | INTRAMUSCULAR | Status: AC
  Administered 2015-03-18: 20 mg via INTRAMUSCULAR
  Filled 2015-03-18: qty 20

## 2015-03-18 MED ORDER — CARBAMAZEPINE 200 MG PO TABS
400.0000 mg | ORAL_TABLET | Freq: Two times a day (BID) | ORAL | Status: DC
  Filled 2015-03-18 (×6): qty 2

## 2015-03-18 MED ORDER — LORAZEPAM 1 MG PO TABS
1.0000 mg | ORAL_TABLET | Freq: Three times a day (TID) | ORAL | Status: DC
  Administered 2015-03-18 – 2015-03-20 (×5): 1 mg via ORAL
  Filled 2015-03-18 (×6): qty 1

## 2015-03-18 NOTE — ED Notes (Signed)
Pt refusing to go back to room, agitating other patients in department.  [employer] and security at bedside for assistance.  Pt cursing, screaming at security and [employer] officers.  Dr Rhunette CroftNanavati notified and at bedside to eval pt. Restraint and med orders given.

## 2015-03-18 NOTE — Consult Note (Signed)
[place] Psychiatry Consult   Reason for Consult:  Agitation, Aggression Referring Physician:  EDP Patient Identification: [name] MRN:  [id number] Principal Diagnosis: Bipolar affective disorder, currently manic, severe, with psychotic features [facility]) Diagnosis:   Patient Active Problem List   Diagnosis Date Noted  . Bipolar affective disorder, currently manic, severe, with psychotic features ([place]) [F31.2] 09/24/2014    Priority: High  . Homicidal ideation [R45.850]   . Bipolar disorder, current episode manic w/o psychotic features, severe ([place]) [F31.13] 08/21/2014  . Cannabis use disorder, moderate, dependence ([place]) [F12.20] 08/21/2014  . Tobacco use disorder [F17.200] 08/21/2014    Total Time spent with patient: 45 minutes  Subjective:   [name] is a 40 y.o. female patient admitted with  Agitation, Aggression,   HPI:  [name], 40 years old was evaluated after her mother IVC her for agitation, aggression towards her and not taking her medications.   Patient is known by our service and has been here several times for posing a danger to her self and others.  Patient has had several inpatient hospitalizations including [facility] and [facility] [facility].  Patient is angry at her mother and stated that she was IVC by her mother because she questioned her reason for marrying a child molester and patient added that the sex offender is her father.  Patient is very loud and snapping at everybody in the unit.  Patient is very intrusive and follows provider to other patient's room.  She is compliant with her medications.  We have contacted her ACT team [facility]  ACT team and mental facility.  She has been accepted for admission and we will be seeking placement at any facility with available bed.  Past Psychiatric History:   Schizoaffective disorder.  Risk to Self: Suicidal Ideation:  (unk) Suicidal Intent:  (unk) Is patient at risk for suicide?:  (unk) Suicidal Plan?:  (unk) Access to  Means:  (unk) What has been your use of drugs/alcohol within the last 12 months?:  (unk) How many times?:  (unk) Other Self Harm Risks:  (unk) Triggers for Past Attempts: Unknown Intentional Self Injurious Behavior:  (unk) Risk to Others: Homicidal Ideation:  (unk) Thoughts of Harm to Others:  (Unk) Current Homicidal Intent:  (unk) Current Homicidal Plan:  (unk) Access to Homicidal Means:  (unk) Identified Victim:  (unk) History of harm to others?:  (unk) Assessment of Violence:  (unk) Violent Behavior Description:  (patient is calm and cooperaive ) Does patient have access to weapons?: No Criminal Charges Pending?: No Does patient have a court date: No Prior Inpatient Therapy: Prior Inpatient Therapy: Yes Prior Therapy Dates:  ([facility]; [facility]-Unk) Prior Therapy Facilty/Provider(s):  [facility] and [facility]) Reason for Treatment:  (Bipolar Disorder, Med Mgmt, Manic) Prior Outpatient Therapy: Prior Outpatient Therapy: Yes Prior Therapy Dates:  (current ) Prior Therapy Facilty/Provider(s):  [facility] ) Reason for Treatment:  (ACT services) Does patient have an ACCT team?: Yes Does patient have Intensive In-House Services?  : No Does patient have [facility] services? : No Does patient have P4CC services?: No  Past Medical History:  Past Medical History  Diagnosis Date  . Bipolar affective ([place])   . OCD (obsessive compulsive disorder)   . Anxiety    History reviewed. No pertinent past surgical history. Family History:  Family History  Problem Relation Age of Onset  . Drug abuse Father   . Mental illness Other    Family Psychiatric  History: Unknown Social History:  History  Alcohol Use  .  Yes    Comment: socially     History  Drug Use  . Yes  . Special: Marijuana    Comment: occasionally    Social History   Social History  . Marital Status: Single    Spouse Name: N/A  . Number of Children: N/A  . Years of Education: N/A   Social History Main Topics  .  Smoking status: Current Every Day Smoker -- 1.00 packs/day for 1 years    Types: Cigarettes  . Smokeless tobacco: Never Used  . Alcohol Use: Yes     Comment: socially  . Drug Use: Yes    Special: Marijuana     Comment: occasionally  . Sexual Activity: No   Other Topics Concern  . None   Social History Narrative   Additional Social History:    Pain Medications: unk Prescriptions: unk Over the Counter: unk History of alcohol / drug use?:  (unk)    Allergies:   Allergies  Allergen Reactions  . Depakote [Divalproex Sodium] Other (See Comments)    "Knocks me out." "knocked out"  . Haldol [Haloperidol] Swelling and Other (See Comments)    Patient has been taking regularly with out reaction until today thinks its this medication that's causing the reaction    Labs:  Results for orders placed or performed during the hospital encounter of 03/17/15 (from the past 48 hour(s))  Comprehensive metabolic panel     Status: Abnormal   Collection Time: 03/17/15  2:14 PM  Result Value Ref Range   Sodium 137 135 - 145 mmol/L   Potassium 3.9 3.5 - 5.1 mmol/L   Chloride 107 101 - 111 mmol/L   CO2 19 (L) 22 - 32 mmol/L   Glucose, Bld 127 (H) 65 - 99 mg/dL   BUN 18 6 - 20 mg/dL   Creatinine, Ser 1.04 (H) 0.44 - 1.00 mg/dL   Calcium 9.1 8.9 - 10.3 mg/dL   Total Protein 9.1 (H) 6.5 - 8.1 g/dL   Albumin 4.5 3.5 - 5.0 g/dL   AST 22 15 - 41 U/L   ALT 16 14 - 54 U/L   Alkaline Phosphatase 61 38 - 126 U/L   Total Bilirubin 0.9 0.3 - 1.2 mg/dL   GFR calc non Af Amer >60 >60 mL/min   GFR calc Af Amer >60 >60 mL/min    Comment: (NOTE) The eGFR has been calculated using the CKD EPI equation. This calculation has not been validated in all clinical situations. eGFR's persistently <60 mL/min signify possible Chronic Kidney Disease.    Anion gap 11 5 - 15  CBC     Status: Abnormal   Collection Time: 03/17/15  2:14 PM  Result Value Ref Range   WBC 9.7 4.0 - 10.5 K/uL   RBC 4.33 3.87 - 5.11  MIL/uL   Hemoglobin 11.8 (L) 12.0 - 15.0 g/dL   HCT 36.0 36.0 - 46.0 %   MCV 83.1 78.0 - 100.0 fL   MCH 27.3 26.0 - 34.0 pg   MCHC 32.8 30.0 - 36.0 g/dL   RDW 15.1 11.5 - 15.5 %   Platelets 392 150 - 400 K/uL  Ethanol (ETOH)     Status: None   Collection Time: 03/17/15  2:15 PM  Result Value Ref Range   Alcohol, Ethyl (B) <5 <5 mg/dL    Comment:        LOWEST DETECTABLE LIMIT FOR SERUM ALCOHOL IS 5 mg/dL FOR MEDICAL PURPOSES ONLY   Salicylate level  Status: None   Collection Time: 03/17/15  2:15 PM  Result Value Ref Range   Salicylate Lvl <2.6 2.8 - 30.0 mg/dL  Acetaminophen level     Status: Abnormal   Collection Time: 03/17/15  2:15 PM  Result Value Ref Range   Acetaminophen (Tylenol), Serum <10 (L) 10 - 30 ug/mL    Comment:        THERAPEUTIC CONCENTRATIONS VARY SIGNIFICANTLY. A RANGE OF 10-30 ug/mL MAY BE AN EFFECTIVE CONCENTRATION FOR MANY PATIENTS. HOWEVER, SOME ARE BEST TREATED AT CONCENTRATIONS OUTSIDE THIS RANGE. ACETAMINOPHEN CONCENTRATIONS >150 ug/mL AT 4 HOURS AFTER INGESTION AND >50 ug/mL AT 12 HOURS AFTER INGESTION ARE OFTEN ASSOCIATED WITH TOXIC REACTIONS.   Carbamazepine level, total     Status: Abnormal   Collection Time: 03/17/15  2:15 PM  Result Value Ref Range   Carbamazepine Lvl <2.0 (L) 4.0 - 12.0 ug/mL    Comment: Performed at [facility]  POC urine preg, ED (not at [facility])     Status: None   Collection Time: 03/17/15  2:25 PM  Result Value Ref Range   Preg Test, Ur NEGATIVE NEGATIVE    Comment:        THE SENSITIVITY OF THIS METHODOLOGY IS >24 mIU/mL   Urine rapid drug screen (hosp performed) (Not at [facility])     Status: None   Collection Time: 03/17/15  2:40 PM  Result Value Ref Range   Opiates NONE DETECTED NONE DETECTED   Cocaine NONE DETECTED NONE DETECTED   Benzodiazepines NONE DETECTED NONE DETECTED   Amphetamines NONE DETECTED NONE DETECTED   Tetrahydrocannabinol NONE DETECTED NONE DETECTED   Barbiturates NONE DETECTED  NONE DETECTED    Comment:        DRUG SCREEN FOR MEDICAL PURPOSES ONLY.  IF CONFIRMATION IS NEEDED FOR ANY PURPOSE, NOTIFY LAB WITHIN 5 DAYS.        LOWEST DETECTABLE LIMITS FOR URINE DRUG SCREEN Drug Class       Cutoff (ng/mL) Amphetamine      1000 Barbiturate      200 Benzodiazepine   948 Tricyclics       546 Opiates          300 Cocaine          300 THC              50   Urinalysis, Routine w reflex microscopic (not at [facility])     Status: Abnormal   Collection Time: 03/17/15  2:40 PM  Result Value Ref Range   Color, Urine YELLOW YELLOW   APPearance CLOUDY (A) CLEAR   Specific Gravity, Urine 1.027 1.005 - 1.030   pH 6.0 5.0 - 8.0   Glucose, UA NEGATIVE NEGATIVE mg/dL   Hgb urine dipstick NEGATIVE NEGATIVE   Bilirubin Urine NEGATIVE NEGATIVE   Ketones, ur NEGATIVE NEGATIVE mg/dL   Protein, ur NEGATIVE NEGATIVE mg/dL   Nitrite NEGATIVE NEGATIVE   Leukocytes, UA NEGATIVE NEGATIVE    Comment: MICROSCOPIC NOT DONE ON URINES WITH NEGATIVE PROTEIN, BLOOD, LEUKOCYTES, NITRITE, OR GLUCOSE <1000 mg/dL.    Current Facility-Administered Medications  Medication Dose Route Frequency Provider Last Rate Last Dose  . acetaminophen (TYLENOL) tablet 650 mg  650 mg Oral Q4H PRN Stevi Barrett, PA-C      . benztropine (COGENTIN) tablet 1 mg  1 mg Oral BID Delfin Gant, NP   1 mg at 03/17/15 2136  . carbamazepine (TEGRETOL) tablet 400 mg  400 mg Oral BID PC Valaria Kohut      .  ibuprofen (ADVIL,MOTRIN) tablet 600 mg  600 mg Oral Q8H PRN Stevi Barrett, PA-C      . LORazepam (ATIVAN) tablet 1 mg  1 mg Oral Q8H PRN Stevi Barrett, PA-C   1 mg at 03/18/15 0158  . LORazepam (ATIVAN) tablet 1 mg  1 mg Oral TID Erich Kochan      . risperiDONE (RISPERDAL M-TABS) disintegrating tablet 3 mg  3 mg Oral QHS Delfin Gant, NP   3 mg at 03/17/15 2135   Current Outpatient Prescriptions  Medication Sig Dispense Refill  . benztropine (COGENTIN) 1 MG tablet Take 1 tablet (1 mg total) by mouth 2  (two) times daily. (Patient not taking: Reported on 03/17/2015) 60 tablet 0  . carbamazepine (TEGRETOL XR) 200 MG 12 hr tablet Take 1 tablet (200 mg total) by mouth 2 (two) times daily. (Patient not taking: Reported on 03/17/2015) 60 tablet 0  . haloperidol (HALDOL) 5 MG tablet Take 1 tablet (5 mg total) by mouth 2 (two) times daily. (Patient not taking: Reported on 03/17/2015) 60 tablet 0  . hydrOXYzine (ATARAX/VISTARIL) 25 MG tablet Take 1 tablet (25 mg total) by mouth every 4 (four) hours as needed for anxiety. (Patient not taking: Reported on 11/10/2014) 30 tablet 0  . LORazepam (ATIVAN) 1 MG tablet Take 1 tablet (1 mg total) by mouth 2 (two) times daily. To be titrated by outpatient provider at first appointment (Patient not taking: Reported on 03/17/2015) 60 tablet 0  . traZODone (DESYREL) 50 MG tablet Take 1 tablet (50 mg total) by mouth at bedtime and may repeat dose one time if needed. (Patient not taking: Reported on 03/17/2015) 30 tablet 0    Musculoskeletal: Strength & Muscle Tone: within normal limits Gait & Station: normal Patient leans: N/A  Psychiatric Specialty Exam: Review of Systems  Unable to perform ROS: mental acuity    Blood pressure 130/66, pulse 99, temperature 98.2 F (36.8 C), temperature source Oral, resp. rate 21, SpO2 100 %.There is no weight on file to calculate BMI.  General Appearance: Casual  Eye Contact::  Good  Speech:  Clear and Coherent and Pressured  Volume:  Normal  Mood:  Anxious and Irritable  Affect:  Congruent and Labile  Thought Process:  Circumstantial, Disorganized, Loose and Tangential  Orientation:  Full (Time, Place, and Person)  Thought Content:  Delusions and Paranoid Ideation  Suicidal Thoughts:  No  Homicidal Thoughts:  No  Memory:  Immediate;   Poor Recent;   Poor Remote;   Poor  Judgement:  Poor  Insight:  Shallow  Psychomotor Activity:  Psychomotor Retardation  Concentration:  Poor  Recall:  NA  Fund of Knowledge:Poor  Language:  Fair  Akathisia:  No  Handed:  Right  AIMS (if indicated):     Assets:  Desire for Improvement  ADL's:  Intact  Cognition: WNL  Sleep:      Treatment Plan Summary: Daily contact with patient to assess and evaluate symptoms and progress in treatment and Medication management  Disposition: Accepted for admission and we will be seeking placement at any hospital with available bed.  Delfin Gant   PMHNP-BC 03/18/2015 11:31 AM Patient seen face-to-face for psychiatric evaluation, chart reviewed and case discussed with the physician extender and developed treatment plan. Reviewed the information documented and agree with the treatment plan. Corena Pilgrim, MD

## 2015-03-18 NOTE — ED Notes (Signed)
D-  Patient has been pacing the hall, intrusive, and needing prompting about her behaviors.  Patient denies AH, VH, and SI and HI.  Patient is impulsive needs constant reminders about boundaries.   A- Offer medications as prescribed, assess for safety, encourage patient to use coping skills.   R-  Patient able to contract for safety.

## 2015-03-18 NOTE — ED Notes (Signed)
Pt slipped out of restraints, out of room, pacing hallway.

## 2015-03-18 NOTE — ED Notes (Signed)
Pt sleeping at present, no distress noted, arouseable to verbal stimuli.  Monitoring for safety, Q 15 min checks in effect. 

## 2015-03-19 DIAGNOSIS — F39 Unspecified mood [affective] disorder: Secondary | ICD-10-CM

## 2015-03-19 DIAGNOSIS — F312 Bipolar disorder, current episode manic severe with psychotic features: Secondary | ICD-10-CM | POA: Diagnosis not present

## 2015-03-19 DIAGNOSIS — F3113 Bipolar disorder, current episode manic without psychotic features, severe: Secondary | ICD-10-CM | POA: Diagnosis not present

## 2015-03-19 NOTE — ED Notes (Signed)
EKG shown to [place]

## 2015-03-19 NOTE — ED Notes (Signed)
Dr a and josephine np into see 

## 2015-03-19 NOTE — ED Notes (Signed)
Pt declineds to take any medications until she sees the Dr.

## 2015-03-19 NOTE — ED Notes (Signed)
Patient appears calm, cooperative. Denies SI, HI, AVH. Reports feeling frustrated. Patient states she is stable and feels that her family and friends betray/abandon her. Patient reports that she does not need to be in the hospital. Patient states she does not like the side effects she experiences with Risperdal and Tegretol.  Encouragement offered.   Q 15 safety checks continue.

## 2015-03-19 NOTE — Consult Note (Signed)
Psychiatric Specialty Exam: Physical Exam  ROS  Blood pressure 118/80, pulse 97, temperature 98.2 F (36.8 C), temperature source Oral, resp. rate 18, SpO2 100 %.There is no weight on file to calculate BMI.  General Appearance: Casual  Eye Contact::  Good  Speech:  Clear and Coherent and Normal Rate  Volume:  Normal  Mood:  Angry and Anxious  Affect:  Congruent and Occasional anger outburst  Thought Process:  Coherent  Orientation:  Full (Time, Place, and Person)  Thought Content:  WDL  Suicidal Thoughts:  No  Homicidal Thoughts:  No  Memory:  Immediate;   Fair Recent;   Fair Remote;   Fair  Judgement:  Fair  Insight:  Fair  Psychomotor Activity:  Normal  Concentration:  Fair  Recall:  NA  Fund of Knowledge:  Fair  Language:  Fair  Akathisia:  No  Handed:  Right  AIMS (if indicated):     Assets:  Desire for Improvement  ADL's:  Intact  Cognition:  WNL  Sleep:      Patient is calm today, cooperative and have not had explosive anger outburst.  She is logical and participated in the assessment this morning.  Patient answered all questions promptly.  She remains angry at her mother for bringing her to the hospital.  Patient denies SI/HI/AVH. Bipolar affective disorder, currently manic, severe, with psychotic features (HCC)   Plans: Continue seeking inpatient hospitalization  [name]   PMHNP-BC Patient seen face-to-face for psychiatric evaluation, chart reviewed and case discussed with the physician extender and developed treatment plan. Reviewed the information documented and agree with the treatment plan. Thedore MinsMojeed Tammi Boulier, MD

## 2015-03-19 NOTE — ED Notes (Signed)
On the phone 

## 2015-03-19 NOTE — ED Notes (Signed)
Pt up in the hall.  Pt requesting to talk w/ th MD/NP about leaving.  Pt reports that she is not manic, and is stable to leave.  Will relay request to NP.

## 2015-03-19 NOTE — ED Notes (Signed)
Up in hall talking w/ tts

## 2015-03-19 NOTE — BH Assessment (Signed)
[facility] "Per Dr. Ardyth HarpsHernandez, due to her acuity, we are unable to manage her needs at this time."

## 2015-03-19 NOTE — BH Assessment (Addendum)
Patient evaluated by this Clinical research associatewriter today. She is calm, pleasant, and cooperative. Patient sts, "I am ready to go because I was brought her unwillingly" and "I have bills to take care of when I get home". Writer interact[facility] and observed patient to be manic, verbally abusive to staff, yelling, using profanity, and not respecting boundaries of others. Today and since the start of my shift at 7am patient presents herself as cooperative, has not yelled, and proven to be threatening to his Clinical research associatewriter or others. Patient has also not required any physical restraints. Writer was able to reason with patient today with her disposition. Patient was happy but seemed to understand that psychiatry would like to further observe her. Patient denies SI, HI, and AVH's. Writer will resubmit patient's clinicals to outside facilities and make them aware that patient's behavior not the same as it was when she presented.

## 2015-03-19 NOTE — ED Notes (Signed)
Patients behavior is significantly improved today.  She has been polite with staff and interacting well with peers.  She is attending to hygiene.

## 2015-03-20 DIAGNOSIS — R509 Fever, unspecified: Secondary | ICD-10-CM | POA: Diagnosis not present

## 2015-03-20 DIAGNOSIS — F17210 Nicotine dependence, cigarettes, uncomplicated: Secondary | ICD-10-CM | POA: Diagnosis not present

## 2015-03-20 DIAGNOSIS — R441 Visual hallucinations: Secondary | ICD-10-CM | POA: Diagnosis not present

## 2015-03-20 DIAGNOSIS — T43506A Underdosing of unspecified antipsychotics and neuroleptics, initial encounter: Secondary | ICD-10-CM | POA: Diagnosis present

## 2015-03-20 DIAGNOSIS — Z3202 Encounter for pregnancy test, result negative: Secondary | ICD-10-CM | POA: Diagnosis not present

## 2015-03-20 DIAGNOSIS — R440 Auditory hallucinations: Secondary | ICD-10-CM | POA: Diagnosis not present

## 2015-03-20 DIAGNOSIS — R451 Restlessness and agitation: Secondary | ICD-10-CM | POA: Diagnosis not present

## 2015-03-20 DIAGNOSIS — R42 Dizziness and giddiness: Secondary | ICD-10-CM | POA: Diagnosis not present

## 2015-03-20 DIAGNOSIS — F319 Bipolar disorder, unspecified: Secondary | ICD-10-CM | POA: Diagnosis not present

## 2015-03-20 DIAGNOSIS — Z91128 Patient's intentional underdosing of medication regimen for other reason: Secondary | ICD-10-CM | POA: Diagnosis not present

## 2015-03-20 DIAGNOSIS — F3113 Bipolar disorder, current episode manic without psychotic features, severe: Secondary | ICD-10-CM | POA: Diagnosis not present

## 2015-03-20 DIAGNOSIS — F259 Schizoaffective disorder, unspecified: Secondary | ICD-10-CM | POA: Diagnosis not present

## 2015-03-20 NOTE — BH Assessment (Signed)
Pt has been accepted to [facility] to Dr. Tobie Poethildes. Pt can be transported after 9 am. Nurse report [phone].

## 2015-03-20 NOTE — ED Provider Notes (Signed)
Patient accepted to [facility]. Dr. Yevonne Alinehildess is accepting.  Pricilla LovelessScott Annahi Short, MD 03/20/15 [phone]

## 2015-03-20 NOTE — BHH Counselor (Signed)
TTS Counselor sent referrals to the following additional facilities in an effort to obtain inpt placement for pt:   [facility]  [facility]    - [facility] [place]   Therapeutic Triage   [facility]   03/20/15      02:25

## 2015-04-27 DIAGNOSIS — E0500 Thyrotoxicosis with diffuse goiter without thyrotoxic crisis or storm: Secondary | ICD-10-CM | POA: Diagnosis not present

## 2015-05-04 DIAGNOSIS — D649 Anemia, unspecified: Secondary | ICD-10-CM | POA: Diagnosis not present

## 2015-05-12 DIAGNOSIS — F314 Bipolar disorder, current episode depressed, severe, without psychotic features: Secondary | ICD-10-CM | POA: Diagnosis not present

## 2015-05-19 ENCOUNTER — Emergency Department (HOSPITAL_COMMUNITY)
Admission: EM | Admit: 2015-05-19 | Discharge: 2015-05-20 | Payer: Medicare Other | Attending: Emergency Medicine | Admitting: Emergency Medicine

## 2015-05-19 ENCOUNTER — Encounter (HOSPITAL_COMMUNITY): Payer: Self-pay

## 2015-05-19 DIAGNOSIS — F23 Brief psychotic disorder: Secondary | ICD-10-CM | POA: Diagnosis not present

## 2015-05-19 DIAGNOSIS — F312 Bipolar disorder, current episode manic severe with psychotic features: Secondary | ICD-10-CM | POA: Diagnosis present

## 2015-05-19 DIAGNOSIS — F17210 Nicotine dependence, cigarettes, uncomplicated: Secondary | ICD-10-CM | POA: Insufficient documentation

## 2015-05-19 DIAGNOSIS — Z79899 Other long term (current) drug therapy: Secondary | ICD-10-CM | POA: Diagnosis not present

## 2015-05-19 DIAGNOSIS — Z3202 Encounter for pregnancy test, result negative: Secondary | ICD-10-CM | POA: Diagnosis not present

## 2015-05-19 DIAGNOSIS — Z008 Encounter for other general examination: Secondary | ICD-10-CM | POA: Diagnosis present

## 2015-05-19 LAB — COMPREHENSIVE METABOLIC PANEL
ALBUMIN: 4.5 g/dL (ref 3.5–5.0)
ALT: 15 U/L (ref 14–54)
AST: 20 U/L (ref 15–41)
Alkaline Phosphatase: 58 U/L (ref 38–126)
Anion gap: 10 (ref 5–15)
BUN: 21 mg/dL — AB (ref 6–20)
CHLORIDE: 109 mmol/L (ref 101–111)
CO2: 22 mmol/L (ref 22–32)
CREATININE: 1.22 mg/dL — AB (ref 0.44–1.00)
Calcium: 9.6 mg/dL (ref 8.9–10.3)
GFR calc Af Amer: >60 mL/min (ref >60)
GFR calc non Af Amer: 56 mL/min — ABNORMAL LOW (ref >60)
GLUCOSE: 146 mg/dL — AB (ref 65–99)
POTASSIUM: 3.8 mmol/L (ref 3.5–5.1)
Sodium: 141 mmol/L (ref 135–145)
Total Bilirubin: 0.4 mg/dL (ref 0.3–1.2)
Total Protein: 8.9 g/dL — ABNORMAL HIGH (ref 6.5–8.1)

## 2015-05-19 LAB — RAPID URINE DRUG SCREEN, HOSP PERFORMED
AMPHETAMINES: NOT DETECTED
BARBITURATES: NOT DETECTED
BENZODIAZEPINES: NOT DETECTED
Cocaine: NOT DETECTED
Opiates: NOT DETECTED
TETRAHYDROCANNABINOL: NOT DETECTED

## 2015-05-19 LAB — CBC
HEMATOCRIT: 35.4 % — AB (ref 36.0–46.0)
HEMOGLOBIN: 12 g/dL (ref 12.0–15.0)
MCH: 26.8 pg (ref 26.0–34.0)
MCHC: 33.9 g/dL (ref 30.0–36.0)
MCV: 79 fL (ref 78.0–100.0)
Platelets: 387 10*3/uL (ref 150–400)
RBC: 4.48 MIL/uL (ref 3.87–5.11)
RDW: 15.2 % (ref 11.5–15.5)
WBC: 10.1 10*3/uL (ref 4.0–10.5)

## 2015-05-19 LAB — URINALYSIS, ROUTINE W REFLEX MICROSCOPIC
BILIRUBIN URINE: NEGATIVE
GLUCOSE, UA: NEGATIVE mg/dL
HGB URINE DIPSTICK: NEGATIVE
KETONES UR: NEGATIVE mg/dL
LEUKOCYTES UA: NEGATIVE
Nitrite: NEGATIVE
PH: 5.5 (ref 5.0–8.0)
Protein, ur: NEGATIVE mg/dL
Specific Gravity, Urine: 1.03 (ref 1.005–1.030)

## 2015-05-19 LAB — PREGNANCY, URINE: PREG TEST UR: NEGATIVE

## 2015-05-19 LAB — SALICYLATE LEVEL: Salicylate Lvl: <4 mg/dL (ref 2.8–30.0)

## 2015-05-19 LAB — ACETAMINOPHEN LEVEL: Acetaminophen (Tylenol), Serum: <10 ug/mL — ABNORMAL LOW (ref 10–30)

## 2015-05-19 LAB — CARBAMAZEPINE LEVEL, TOTAL: Carbamazepine Lvl: <2 ug/mL — ABNORMAL LOW (ref 4.0–12.0)

## 2015-05-19 LAB — ETHANOL: Alcohol, Ethyl (B): <5 mg/dL (ref <5)

## 2015-05-19 MED ORDER — CARBAMAZEPINE ER 200 MG PO TB12
200.0000 mg | ORAL_TABLET | Freq: Two times a day (BID) | ORAL | Status: DC
  Administered 2015-05-19 – 2015-05-20 (×2): 200 mg via ORAL
  Filled 2015-05-19 (×3): qty 1

## 2015-05-19 MED ORDER — HALOPERIDOL 5 MG PO TABS
5.0000 mg | ORAL_TABLET | Freq: Two times a day (BID) | ORAL | Status: DC
  Administered 2015-05-19 – 2015-05-20 (×2): 5 mg via ORAL
  Filled 2015-05-19 (×2): qty 1

## 2015-05-19 MED ORDER — TRAZODONE HCL 50 MG PO TABS
50.0000 mg | ORAL_TABLET | Freq: Every day | ORAL | Status: DC
  Administered 2015-05-19: 50 mg via ORAL
  Filled 2015-05-19: qty 1

## 2015-05-19 MED ORDER — LAMOTRIGINE 25 MG PO TABS
50.0000 mg | ORAL_TABLET | Freq: Every day | ORAL | Status: DC
  Administered 2015-05-19: 50 mg via ORAL
  Filled 2015-05-19 (×2): qty 2

## 2015-05-19 MED ORDER — BENZTROPINE MESYLATE 1 MG PO TABS
1.0000 mg | ORAL_TABLET | Freq: Every day | ORAL | Status: DC
  Administered 2015-05-19: 1 mg via ORAL
  Filled 2015-05-19: qty 1

## 2015-05-19 NOTE — ED Notes (Signed)
Patient  Has episodes of hysterical laughing then yelling and screaming obscenities at the staff.

## 2015-05-19 NOTE — ED Notes (Signed)
Patient was brought in by her Child psychotherapistsocial worker. SW stated that the patient was manic today. Patient stated, "My mama died and I need t tell the psychiatric people." Patient then stated, "the whole congregation died of aids. They planned it."
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
## 2015-05-19 NOTE — ED Notes (Signed)
Attempted to call report.eceiving nurse in SAPPU to call writer back.

## 2015-05-19 NOTE — BH Assessment (Signed)
TTS [name] will assess the patient.  

## 2015-05-19 NOTE — ED Notes (Signed)
Pt change into wine scrubs

## 2015-05-19 NOTE — ED Provider Notes (Signed)
CSN: [id number]     Arrival date & time 05/19/15  1606 History   First MD Initiated Contact with Patient 05/19/15 1626     Chief Complaint  Patient presents with  . Medical Clearance     (Consider location/radiation/quality/duration/timing/severity/associated sxs/prior Treatment) The history is provided by the patient and a caregiver. The history is limited by the condition of the patient.  Patient with hx bipolar disorder, presents w community ACT team member, feeling patient may have psychosis/manic episode. Pt very poor/difficult historian - speaking rapidly, bouncing from one unrelated topic to next but not answering questions - level 5 caveat.   Pt denies pain. Denies fever. ACT member feels likely non compliant w meds. THC use, denies other drug use.      Past Medical History  Diagnosis Date  . Bipolar affective (HCC)   . OCD (obsessive compulsive disorder)   . Anxiety    History reviewed. No pertinent past surgical history. Family History  Problem Relation Age of Onset  . Drug abuse Father   . Mental illness Other    Social History  Substance Use Topics  . Smoking status: Current Every Day Smoker -- 1.00 packs/day for 1 years    Types: Cigarettes  . Smokeless tobacco: Never Used  . Alcohol Use: Yes     Comment: socially   OB History    No data available        Review of Systems  Unable to perform ROS: Psychiatric disorder  level 5 caveat - psych illness, not responsive to questions.     Allergies  Depakote and Haldol  Home Medications   Prior to Admission medications   Medication Sig Start Date End Date Taking? Authorizing Provider  benztropine (COGENTIN) 1 MG tablet Take 1 tablet (1 mg total) by mouth 2 (two) times daily. Patient not taking: Reported on 03/17/2015 10/02/14   Beau FannyJohn C Withrow, FNP  carbamazepine (TEGRETOL XR) 200 MG 12 hr tablet Take 1 tablet (200 mg total) by mouth 2 (two) times daily. Patient not taking: Reported on 03/17/2015 10/02/14    Beau FannyJohn C Withrow, FNP  haloperidol (HALDOL) 5 MG tablet Take 1 tablet (5 mg total) by mouth 2 (two) times daily. Patient not taking: Reported on 03/17/2015 10/02/14   Beau FannyJohn C Withrow, FNP  hydrOXYzine (ATARAX/VISTARIL) 25 MG tablet Take 1 tablet (25 mg total) by mouth every 4 (four) hours as needed for anxiety. Patient not taking: Reported on 11/10/2014 10/02/14   Beau FannyJohn C Withrow, FNP  LORazepam (ATIVAN) 1 MG tablet Take 1 tablet (1 mg total) by mouth 2 (two) times daily. To be titrated by outpatient provider at first appointment Patient not taking: Reported on 03/17/2015 10/02/14   Beau FannyJohn C Withrow, FNP  traZODone (DESYREL) 50 MG tablet Take 1 tablet (50 mg total) by mouth at bedtime and may repeat dose one time if needed. Patient not taking: Reported on 03/17/2015 10/02/14   Beau FannyJohn C Withrow, FNP   There were no vitals taken for this visit. Physical Exam  Constitutional: She appears well-developed and well-nourished. No distress.  HENT:  Head: Atraumatic.  Mouth/Throat: Oropharynx is clear and moist.  Eyes: Conjunctivae are normal. Pupils are equal, round, and reactive to light. No scleral icterus.  Neck: Neck supple. No tracheal deviation present. No thyromegaly present.  No bruit  Cardiovascular: Normal rate, regular rhythm, normal heart sounds and intact distal pulses.   No murmur heard. Pulmonary/Chest: Effort normal and breath sounds normal. No respiratory distress.  Abdominal: Soft. Normal appearance  and bowel sounds are normal. She exhibits no distension. There is no tenderness.  Musculoskeletal: She exhibits no edema or tenderness.  Neurological: She is alert.  Speech pressure, fast, alternating between topics. Ambulates w steady gait.   Skin: Skin is warm and dry. No rash noted. She is not diaphoretic.  Psychiatric:  Manic behavior. Pressured speech.   Nursing note and vitals reviewed.   ED Course  Procedures (including critical care time) Labs Review   Results for orders placed or  performed during the hospital encounter of 05/19/15  Comprehensive metabolic panel  Result Value Ref Range   Sodium 141 135 - 145 mmol/L   Potassium 3.8 3.5 - 5.1 mmol/L   Chloride 109 101 - 111 mmol/L   CO2 22 22 - 32 mmol/L   Glucose, Bld 146 (H) 65 - 99 mg/dL   BUN 21 (H) 6 - 20 mg/dL   Creatinine, Ser 1.61 (H) 0.44 - 1.00 mg/dL   Calcium 9.6 8.9 - 09.6 mg/dL   Total Protein 8.9 (H) 6.5 - 8.1 g/dL   Albumin 4.5 3.5 - 5.0 g/dL   AST 20 15 - 41 U/L   ALT 15 14 - 54 U/L   Alkaline Phosphatase 58 38 - 126 U/L   Total Bilirubin 0.4 0.3 - 1.2 mg/dL   GFR calc non Af Amer 56 (L) >60 mL/min   GFR calc Af Amer >60 >60 mL/min   Anion gap 10 5 - 15  Ethanol (ETOH)  Result Value Ref Range   Alcohol, Ethyl (B) <5 <5 mg/dL  Salicylate level  Result Value Ref Range   Salicylate Lvl <4.0 2.8 - 30.0 mg/dL  Acetaminophen level  Result Value Ref Range   Acetaminophen (Tylenol), Serum <10 (L) 10 - 30 ug/mL  CBC  Result Value Ref Range   WBC 10.1 4.0 - 10.5 K/uL   RBC 4.48 3.87 - 5.11 MIL/uL   Hemoglobin 12.0 12.0 - 15.0 g/dL   HCT 04.5 (L) 40.9 - 81.1 %   MCV 79.0 78.0 - 100.0 fL   MCH 26.8 26.0 - 34.0 pg   MCHC 33.9 30.0 - 36.0 g/dL   RDW 91.4 78.2 - 95.6 %   Platelets 387 150 - 400 K/uL  Urine rapid drug screen (hosp performed) (Not at [facility])  Result Value Ref Range   Opiates NONE DETECTED NONE DETECTED   Cocaine NONE DETECTED NONE DETECTED   Benzodiazepines NONE DETECTED NONE DETECTED   Amphetamines NONE DETECTED NONE DETECTED   Tetrahydrocannabinol NONE DETECTED NONE DETECTED   Barbiturates NONE DETECTED NONE DETECTED  Pregnancy, urine  Result Value Ref Range   Preg Test, Ur NEGATIVE NEGATIVE  Urinalysis, Routine w reflex microscopic  Result Value Ref Range   Color, Urine YELLOW YELLOW   APPearance CLOUDY (A) CLEAR   Specific Gravity, Urine 1.030 1.005 - 1.030   pH 5.5 5.0 - 8.0   Glucose, UA NEGATIVE NEGATIVE mg/dL   Hgb urine dipstick NEGATIVE NEGATIVE   Bilirubin Urine  NEGATIVE NEGATIVE   Ketones, ur NEGATIVE NEGATIVE mg/dL   Protein, ur NEGATIVE NEGATIVE mg/dL   Nitrite NEGATIVE NEGATIVE   Leukocytes, UA NEGATIVE NEGATIVE      I have personally reviewed and evaluated these lab results as part of my medical decision-making.    MDM   Labs.  Psych team consulted.  Reviewed nursing notes and prior charts for additional history.   Psych temporary holding orders placed.  Recheck calmer, alert.  Will continue home meds.  Disposition per  BH team.      Cathren Laine, MD 05/19/15 [phone]

## 2015-05-19 NOTE — ED Notes (Signed)
Patient refusing vital signs. Patient stated, "Don't touch me" and then demanded that security stay outside her room in triage.

## 2015-05-19 NOTE — ED Notes (Signed)
Patient came back to the unit ambulatory with Triage staff.  She is loud with pressured speech and often stands in front of the nursing station staring at the staff.  She denies thoughts of harm to self or others.  She denies auditory or visual hallucinations.  She states she has been off of her medications as she does not like to take them because of the risk of side effects.  She believes that her mother and all members of her church died today of AIDS because they were all sleeping around with each other and not using protection.

## 2015-05-20 DIAGNOSIS — F23 Brief psychotic disorder: Secondary | ICD-10-CM | POA: Diagnosis not present

## 2015-05-20 MED ORDER — BENZTROPINE MESYLATE 1 MG PO TABS
0.5000 mg | ORAL_TABLET | Freq: Two times a day (BID) | ORAL | Status: DC
  Administered 2015-05-20: 0.5 mg via ORAL
  Filled 2015-05-20: qty 1

## 2015-05-20 MED ORDER — LURASIDONE HCL 80 MG PO TABS
80.0000 mg | ORAL_TABLET | Freq: Every day | ORAL | Status: DC
  Filled 2015-05-20: qty 1

## 2015-05-20 MED ORDER — CLONAZEPAM 0.5 MG PO TABS
0.5000 mg | ORAL_TABLET | Freq: Two times a day (BID) | ORAL | Status: DC
  Administered 2015-05-20: 0.5 mg via ORAL
  Filled 2015-05-20: qty 1

## 2015-05-20 MED ORDER — OLANZAPINE 10 MG PO TBDP
10.0000 mg | ORAL_TABLET | Freq: Three times a day (TID) | ORAL | Status: DC | PRN
  Filled 2015-05-20: qty 1

## 2015-05-20 MED ORDER — TRAZODONE HCL 100 MG PO TABS: 100.0000 mg | ORAL_TABLET | Freq: Every day | ORAL | Status: DC

## 2015-05-20 NOTE — ED Notes (Signed)
Patient denies SI, HI and AVH at this time. However, patient states that " My Mother and all members of her church died today of AIDS because they were all sleeping around with each other and not using protection." Plan is excessively loud with rapid pressure speech and intrusive at this time towards other patient in unit. Plan of care discussed with patient. Encouragement and support provided and safety maintain. Q 15 min safety checks remain in place.

## 2015-05-20 NOTE — ED Notes (Signed)
Patient took medications without resistance. Patient is calm and cooperative at this time. Patient states "I'm not manic this time and all I want to do is get my stuff and go home". Plan of discussed with patient again and patient voices no complaints or concerns.Respirations equal and unlabored, skin warm and dry and NAD. Encouragement and support provided and safety maintain. Q 15 safety checks remain in place.

## 2015-05-20 NOTE — BH Assessment (Addendum)
Received a call from Delice Bisonara at [facility]. Sts that patient has been accepted to the adult behavioral health unit. [place] is [address], [place], [place] [address]. Patient's accepting provider is Dr. Simonne ComeVeerainder Oli. Nursing report [phone].

## 2015-05-20 NOTE — BH Assessment (Signed)
[facility]HH Assessment Progress Note  Per Thedore MinsMojeed Akintayo, MD, this pt requires psychiatric hospitalization at this time.  He has initiated IVC, and [place].  At 11:16 Burna CashMagistrate Morton acknowledges receipt.  Pt's nurse, Diane, has been informed that [facility] and that pt is waiting for service of Findings and Custody Order.  Doylene Canninghomas Cortina Vultaggio, MA Triage Specialist [phone]
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
## 2015-05-20 NOTE — ED Notes (Signed)
Pt has been manic today. She stayed in front of the nurse's station and yelled insults, and laughed uncontrollably. She complained about taking medications, said, "Why don't you take them", but she took her scheduled medications. She would not take a PRN Zyprexa that was offered. About an hour after taking medications she was able to sleep. Once she got up, called her mother, and yelled at her mother over the telephone.

## 2015-05-24 DIAGNOSIS — R9431 Abnormal electrocardiogram [ECG] [EKG]: Secondary | ICD-10-CM | POA: Diagnosis not present

## 2015-06-02 DIAGNOSIS — F314 Bipolar disorder, current episode depressed, severe, without psychotic features: Secondary | ICD-10-CM | POA: Diagnosis not present

## 2015-06-15 DIAGNOSIS — F314 Bipolar disorder, current episode depressed, severe, without psychotic features: Secondary | ICD-10-CM | POA: Diagnosis not present

## 2015-07-08 DIAGNOSIS — F314 Bipolar disorder, current episode depressed, severe, without psychotic features: Secondary | ICD-10-CM | POA: Diagnosis not present

## 2015-07-14 ENCOUNTER — Encounter (HOSPITAL_COMMUNITY): Payer: Self-pay | Admitting: *Deleted

## 2015-07-14 ENCOUNTER — Emergency Department (HOSPITAL_COMMUNITY)
Admission: EM | Admit: 2015-07-14 | Discharge: 2015-07-14 | Disposition: A | Discharge status: Home / Self Care | Payer: Medicare Other | Attending: Emergency Medicine | Admitting: Emergency Medicine

## 2015-07-14 DIAGNOSIS — F17210 Nicotine dependence, cigarettes, uncomplicated: Secondary | ICD-10-CM | POA: Insufficient documentation

## 2015-07-14 DIAGNOSIS — F309 Manic episode, unspecified: Secondary | ICD-10-CM | POA: Insufficient documentation

## 2015-07-14 NOTE — ED Notes (Signed)
Pt arrives to the ER with brother; family reports pt not taking her medications and increasing manic behavior over the last few days; pt yelling and cursing in triage; pt denies SI / HI; pt states that she doesn't take her medication because its poison

## 2015-07-16 ENCOUNTER — Encounter (HOSPITAL_COMMUNITY): Payer: Self-pay | Admitting: Emergency Medicine

## 2015-07-16 ENCOUNTER — Emergency Department (HOSPITAL_COMMUNITY)
Admission: EM | Admit: 2015-07-16 | Discharge: 2015-07-16 | Disposition: A | Discharge status: Home / Self Care | Payer: Medicare Other | Attending: Emergency Medicine | Admitting: Emergency Medicine

## 2015-07-16 DIAGNOSIS — F3110 Bipolar disorder, current episode manic without psychotic features, unspecified: Secondary | ICD-10-CM

## 2015-07-16 DIAGNOSIS — Z9119 Patient's noncompliance with other medical treatment and regimen: Secondary | ICD-10-CM | POA: Diagnosis not present

## 2015-07-16 DIAGNOSIS — F312 Bipolar disorder, current episode manic severe with psychotic features: Secondary | ICD-10-CM | POA: Diagnosis present

## 2015-07-16 DIAGNOSIS — F17210 Nicotine dependence, cigarettes, uncomplicated: Secondary | ICD-10-CM | POA: Diagnosis not present

## 2015-07-16 DIAGNOSIS — F609 Personality disorder, unspecified: Secondary | ICD-10-CM | POA: Diagnosis present

## 2015-07-16 DIAGNOSIS — Z0000 Encounter for general adult medical examination without abnormal findings: Secondary | ICD-10-CM | POA: Diagnosis present

## 2015-07-16 DIAGNOSIS — Z6830 Body mass index (BMI) 30.0-30.9, adult: Secondary | ICD-10-CM | POA: Diagnosis not present

## 2015-07-16 DIAGNOSIS — R000 Tachycardia, unspecified: Secondary | ICD-10-CM | POA: Diagnosis present

## 2015-07-16 DIAGNOSIS — F319 Bipolar disorder, unspecified: Secondary | ICD-10-CM | POA: Insufficient documentation

## 2015-07-16 DIAGNOSIS — Z79899 Other long term (current) drug therapy: Secondary | ICD-10-CM | POA: Insufficient documentation

## 2015-07-16 DIAGNOSIS — F250 Schizoaffective disorder, bipolar type: Secondary | ICD-10-CM | POA: Diagnosis not present

## 2015-07-16 DIAGNOSIS — E669 Obesity, unspecified: Secondary | ICD-10-CM | POA: Diagnosis present

## 2015-07-16 LAB — RAPID URINE DRUG SCREEN, HOSP PERFORMED
Amphetamines: NOT DETECTED
BENZODIAZEPINES: NOT DETECTED
Barbiturates: NOT DETECTED
Cocaine: NOT DETECTED
Opiates: NOT DETECTED
Tetrahydrocannabinol: POSITIVE — AB

## 2015-07-16 LAB — COMPREHENSIVE METABOLIC PANEL
ALT: 15 U/L (ref 14–54)
AST: 20 U/L (ref 15–41)
Albumin: 4.8 g/dL (ref 3.5–5.0)
Alkaline Phosphatase: 59 U/L (ref 38–126)
Anion gap: 10 (ref 5–15)
BILIRUBIN TOTAL: 0.4 mg/dL (ref 0.3–1.2)
BUN: 24 mg/dL — ABNORMAL HIGH (ref 6–20)
CHLORIDE: 110 mmol/L (ref 101–111)
CO2: 20 mmol/L — ABNORMAL LOW (ref 22–32)
CREATININE: 1.28 mg/dL — AB (ref 0.44–1.00)
Calcium: 9.8 mg/dL (ref 8.9–10.3)
GFR, EST NON AFRICAN AMERICAN: 53 mL/min — AB (ref >60)
Glucose, Bld: 132 mg/dL — ABNORMAL HIGH (ref 65–99)
POTASSIUM: 3.9 mmol/L (ref 3.5–5.1)
Sodium: 140 mmol/L (ref 135–145)
TOTAL PROTEIN: 8.9 g/dL — AB (ref 6.5–8.1)

## 2015-07-16 LAB — PREGNANCY, URINE: PREG TEST UR: NEGATIVE

## 2015-07-16 LAB — ETHANOL

## 2015-07-16 LAB — CBC WITH DIFFERENTIAL/PLATELET
Basophils Absolute: 0 10*3/uL (ref 0.0–0.1)
Basophils Relative: 0 %
EOS PCT: 2 %
Eosinophils Absolute: 0.2 10*3/uL (ref 0.0–0.7)
HEMATOCRIT: 34.9 % — AB (ref 36.0–46.0)
Hemoglobin: 11.6 g/dL — ABNORMAL LOW (ref 12.0–15.0)
LYMPHS ABS: 3.3 10*3/uL (ref 0.7–4.0)
LYMPHS PCT: 36 %
MCH: 26.5 pg (ref 26.0–34.0)
MCHC: 33.2 g/dL (ref 30.0–36.0)
MCV: 79.9 fL (ref 78.0–100.0)
MONO ABS: 0.7 10*3/uL (ref 0.1–1.0)
Monocytes Relative: 8 %
Neutro Abs: 4.9 10*3/uL (ref 1.7–7.7)
Neutrophils Relative %: 54 %
PLATELETS: 439 10*3/uL — AB (ref 150–400)
RBC: 4.37 MIL/uL (ref 3.87–5.11)
RDW: 15.4 % (ref 11.5–15.5)
WBC: 9.2 10*3/uL (ref 4.0–10.5)

## 2015-07-16 MED ORDER — ZIPRASIDONE MESYLATE 20 MG IM SOLR
20.0000 mg | Freq: Once | INTRAMUSCULAR | Status: AC
  Administered 2015-07-16: 20 mg via INTRAMUSCULAR
  Filled 2015-07-16: qty 20

## 2015-07-16 MED ORDER — DIPHENHYDRAMINE HCL 50 MG/ML IJ SOLN
50.0000 mg | Freq: Once | INTRAMUSCULAR | Status: AC
  Administered 2015-07-16: 50 mg via INTRAMUSCULAR
  Filled 2015-07-16: qty 1

## 2015-07-16 MED ORDER — LORAZEPAM 2 MG/ML IJ SOLN
2.0000 mg | Freq: Once | INTRAMUSCULAR | Status: AC
  Administered 2015-07-16: 2 mg via INTRAMUSCULAR
  Filled 2015-07-16: qty 1

## 2015-07-16 MED ORDER — LORAZEPAM 1 MG PO TABS
1.0000 mg | ORAL_TABLET | Freq: Three times a day (TID) | ORAL | Status: DC | PRN
  Administered 2015-07-16: 1 mg via ORAL
  Filled 2015-07-16 (×2): qty 1

## 2015-07-16 MED ORDER — LURASIDONE HCL 40 MG PO TABS
40.0000 mg | ORAL_TABLET | Freq: Every day | ORAL | Status: DC
  Filled 2015-07-16: qty 1

## 2015-07-16 MED ORDER — ONDANSETRON HCL 4 MG PO TABS: 4.0000 mg | ORAL_TABLET | Freq: Three times a day (TID) | ORAL | Status: DC | PRN

## 2015-07-16 MED ORDER — CARBAMAZEPINE ER 200 MG PO TB12
200.0000 mg | ORAL_TABLET | Freq: Two times a day (BID) | ORAL | Status: DC
  Filled 2015-07-16 (×2): qty 1

## 2015-07-16 MED ORDER — ACETAMINOPHEN 325 MG PO TABS: 650.0000 mg | ORAL_TABLET | ORAL | Status: DC | PRN

## 2015-07-16 MED ORDER — TRAZODONE HCL 100 MG PO TABS: 100.0000 mg | ORAL_TABLET | Freq: Every day | ORAL | Status: DC

## 2015-07-16 MED ORDER — IBUPROFEN 200 MG PO TABS: 600.0000 mg | ORAL_TABLET | Freq: Three times a day (TID) | ORAL | Status: DC | PRN

## 2015-07-16 MED ORDER — BENZTROPINE MESYLATE 1 MG PO TABS: 1.0000 mg | ORAL_TABLET | Freq: Two times a day (BID) | ORAL | Status: DC

## 2015-07-16 NOTE — ED Notes (Signed)
Patient voluntary brought in by [employer]. States that "I was bored and decided to come in". Patient talking to self in triage, laughing uncontrollably. Reports that she thinks her meds are not working.

## 2015-07-16 NOTE — Consult Note (Signed)
[facility] Consult   Reason for Consult: Agitation, Manic, Angry, intrusive.  Referring Physician:  EDP Patient Identification: [name] MRN:  [id number] Principal Diagnosis: Bipolar affective disorder, currently manic, severe, with psychotic features [facility]) Diagnosis:   Patient Active Problem List   Diagnosis Date Noted  . Bipolar affective disorder, currently manic, severe, with psychotic features ([place]) [F31.2] 09/24/2014    Priority: High  . Mood disorder ([place]) [F39]   . Affective psychosis, bipolar ([place]) [F31.9]   . Homicidal ideation [R45.850]   . Bipolar disorder, current episode manic w/o psychotic features, severe ([place]) [F31.13] 08/21/2014  . Cannabis use disorder, moderate, dependence ([place]) [F12.20] 08/21/2014  . Tobacco use disorder [F17.200] 08/21/2014    Total Time spent with patient: 30 minutes  Subjective:   [name] is a 40 y.o. female patient admitted with Agitation, Manic, Angry, intrusive.  HPI:  Attempted to evaluate [name] 40 years old who is hyper verbal. And whose speech  is loud, rapid, pressured and tangential. Pt is disorganized in thought process.  She is intrusive and , physically and verbally abusive.  She is  observed going into other patient's  Rooms and refusing to come out when instructed.  She laughs loudly and makes derogating remarks about staff.  Geodon 20 mg IM, Ativan 2 mg IM and Benadryl 50 mg IM was prescribed by Provider and given by Nursing staff.  Patient has an ACT team headed by DR Jake Samples at [facility]  And they have been contacted.   We have accepted her for admission and we will be seeking placement at any facility with available bed.  Past Psychiatric History: Bipolar disorder, Manic, Cannabis use disorder,  Risk to Self: Suicidal Ideation: No Suicidal Intent: No Is patient at risk for suicide?: No Suicidal Plan?: No Access to Means: No What has been your use of drugs/alcohol within  the last 12 months?: no use reported How many times?: 0 Other Self Harm Risks: 0 Triggers for Past Attempts: Other (Comment) (no reported past attempts) Intentional Self Injurious Behavior: None Risk to Others: Homicidal Ideation: No Thoughts of Harm to Others: No Current Homicidal Intent: No Current Homicidal Plan: No Access to Homicidal Means: No History of harm to others?: No Assessment of Violence: None Noted Violent Behavior Description: none noted Does patient have access to weapons?: No Criminal Charges Pending?: No Does patient have a court date: No Prior Inpatient Therapy: Prior Inpatient Therapy: Yes Prior Therapy Dates: multiple times for years-last time 05/2015 Prior Therapy Facilty/Provider(s): multiple hospitals Reason for Treatment: schizoaffective Prior Outpatient Therapy: Prior Outpatient Therapy: Yes Prior Therapy Dates: Ongoing Prior Therapy Facilty/Provider(s): [facility] ACTT Team  Reason for Treatment: ACTT Team Services Does patient have an ACCT team?: Yes Does patient have Intensive In-House Services?  : No Does patient have [facility] services? : No Does patient have P4CC services?: No  Past Medical History:  Past Medical History  Diagnosis Date  . Bipolar affective ([place])   . OCD (obsessive compulsive disorder)   . Anxiety    History reviewed. No pertinent past surgical history. Family History:  Family History  Problem Relation Age of Onset  . Drug abuse Father   . Mental illness Other    Family Psychiatric  History: unable to obtain Social History:  History  Alcohol Use  . Yes    Comment: socially     History  Drug Use  . Yes  . Special: Marijuana    Comment: occasionally  Social History   Social History  . Marital Status: Single    Spouse Name: N/A  . Number of Children: N/A  . Years of Education: N/A   Social History Main Topics  . Smoking status: Current Every Day Smoker -- 1.00 packs/day for 1 years    Types:  Cigarettes  . Smokeless tobacco: Never Used  . Alcohol Use: Yes     Comment: socially  . Drug Use: Yes    Special: Marijuana     Comment: occasionally  . Sexual Activity: No   Other Topics Concern  . None   Social History Narrative   Additional Social History:    Allergies:   Allergies  Allergen Reactions  . Depakote [Divalproex Sodium] Other (See Comments)    "Knocks me out." "knocked out"  . Haldol [Haloperidol] Swelling and Other (See Comments)    Patient has been taking regularly with out reaction until today thinks its this medication that's causing the reaction    Labs:  Results for orders placed or performed during the hospital encounter of 07/16/15 (from the past 48 hour(s))  CBC with Differential     Status: Abnormal   Collection Time: 07/16/15  8:24 AM  Result Value Ref Range   WBC 9.2 4.0 - 10.5 K/uL   RBC 4.37 3.87 - 5.11 MIL/uL   Hemoglobin 11.6 (L) 12.0 - 15.0 g/dL   HCT 34.9 (L) 36.0 - 46.0 %   MCV 79.9 78.0 - 100.0 fL   MCH 26.5 26.0 - 34.0 pg   MCHC 33.2 30.0 - 36.0 g/dL   RDW 15.4 11.5 - 15.5 %   Platelets 439 (H) 150 - 400 K/uL   Neutrophils Relative % 54 %   Neutro Abs 4.9 1.7 - 7.7 K/uL   Lymphocytes Relative 36 %   Lymphs Abs 3.3 0.7 - 4.0 K/uL   Monocytes Relative 8 %   Monocytes Absolute 0.7 0.1 - 1.0 K/uL   Eosinophils Relative 2 %   Eosinophils Absolute 0.2 0.0 - 0.7 K/uL   Basophils Relative 0 %   Basophils Absolute 0.0 0.0 - 0.1 K/uL  Comprehensive metabolic panel     Status: Abnormal   Collection Time: 07/16/15  8:24 AM  Result Value Ref Range   Sodium 140 135 - 145 mmol/L   Potassium 3.9 3.5 - 5.1 mmol/L   Chloride 110 101 - 111 mmol/L   CO2 20 (L) 22 - 32 mmol/L   Glucose, Bld 132 (H) 65 - 99 mg/dL   BUN 24 (H) 6 - 20 mg/dL   Creatinine, Ser 1.28 (H) 0.44 - 1.00 mg/dL   Calcium 9.8 8.9 - 10.3 mg/dL   Total Protein 8.9 (H) 6.5 - 8.1 g/dL   Albumin 4.8 3.5 - 5.0 g/dL   AST 20 15 - 41 U/L   ALT 15 14 - 54 U/L   Alkaline  Phosphatase 59 38 - 126 U/L   Total Bilirubin 0.4 0.3 - 1.2 mg/dL   GFR calc non Af Amer 53 (L) >60 mL/min   GFR calc Af Amer >60 >60 mL/min    Comment: (NOTE) The eGFR has been calculated using the CKD EPI equation. This calculation has not been validated in all clinical situations. eGFR's persistently <60 mL/min signify possible Chronic Kidney Disease.    Anion gap 10 5 - 15  Ethanol     Status: None   Collection Time: 07/16/15  8:29 AM  Result Value Ref Range   Alcohol, Ethyl (B) <5 <5  mg/dL    Comment:        LOWEST DETECTABLE LIMIT FOR SERUM ALCOHOL IS 5 mg/dL FOR MEDICAL PURPOSES ONLY   Pregnancy, urine     Status: None   Collection Time: 07/16/15  8:45 AM  Result Value Ref Range   Preg Test, Ur NEGATIVE NEGATIVE    Comment:        THE SENSITIVITY OF THIS METHODOLOGY IS >20 mIU/mL.   Urine rapid drug screen (hosp performed)     Status: Abnormal   Collection Time: 07/16/15  8:45 AM  Result Value Ref Range   Opiates NONE DETECTED NONE DETECTED   Cocaine NONE DETECTED NONE DETECTED   Benzodiazepines NONE DETECTED NONE DETECTED   Amphetamines NONE DETECTED NONE DETECTED   Tetrahydrocannabinol POSITIVE (A) NONE DETECTED   Barbiturates NONE DETECTED NONE DETECTED    Comment:        DRUG SCREEN FOR MEDICAL PURPOSES ONLY.  IF CONFIRMATION IS NEEDED FOR ANY PURPOSE, NOTIFY LAB WITHIN 5 DAYS.        LOWEST DETECTABLE LIMITS FOR URINE DRUG SCREEN Drug Class       Cutoff (ng/mL) Amphetamine      1000 Barbiturate      200 Benzodiazepine   200 Tricyclics       300 Opiates          300 Cocaine          300 THC              50     Current Facility-Administered Medications  Medication Dose Route Frequency Provider Last Rate Last Dose  . acetaminophen (TYLENOL) tablet 650 mg  650 mg Oral Q4H PRN Benjiman Core, MD      . benztropine (COGENTIN) tablet 1 mg  1 mg Oral BID Thedore Mins, MD   Stopped at 07/16/15 1045  . carbamazepine (TEGRETOL XR) 12 hr tablet 200 mg   200 mg Oral BID Thedore Mins, MD   Stopped at 07/16/15 1045  . ibuprofen (ADVIL,MOTRIN) tablet 600 mg  600 mg Oral Q8H PRN Benjiman Core, MD      . LORazepam (ATIVAN) tablet 1 mg  1 mg Oral Q8H PRN Benjiman Core, MD      . lurasidone (LATUDA) tablet 40 mg  40 mg Oral QHS Mckade Gurka, MD      . ondansetron (ZOFRAN) tablet 4 mg  4 mg Oral Q8H PRN Benjiman Core, MD      . traZODone (DESYREL) tablet 100 mg  100 mg Oral QHS Thedore Mins, MD       Current Outpatient Prescriptions  Medication Sig Dispense Refill  . benztropine (COGENTIN) 1 MG tablet Take 1 tablet (1 mg total) by mouth 2 (two) times daily. (Patient taking differently: Take 1 mg by mouth at bedtime. ) 60 tablet 0  . carbamazepine (TEGRETOL XR) 200 MG 12 hr tablet Take 1 tablet (200 mg total) by mouth 2 (two) times daily. (Patient not taking: Reported on 03/17/2015) 60 tablet 0  . haloperidol (HALDOL) 5 MG tablet Take 1 tablet (5 mg total) by mouth 2 (two) times daily. (Patient not taking: Reported on 03/17/2015) 60 tablet 0  . hydrOXYzine (ATARAX/VISTARIL) 25 MG tablet Take 1 tablet (25 mg total) by mouth every 4 (four) hours as needed for anxiety. (Patient not taking: Reported on 11/10/2014) 30 tablet 0  . lamoTRIgine (LAMICTAL) 25 MG tablet Take 50 mg by mouth at bedtime.    Marland Kitchen LORazepam (ATIVAN) 1 MG tablet Take 1  tablet (1 mg total) by mouth 2 (two) times daily. To be titrated by outpatient provider at first appointment (Patient not taking: Reported on 03/17/2015) 60 tablet 0  . lurasidone (LATUDA) 40 MG TABS tablet Take 40 mg by mouth at bedtime.    . traZODone (DESYREL) 50 MG tablet Take 1 tablet (50 mg total) by mouth at bedtime and may repeat dose one time if needed. (Patient taking differently: Take 50 mg by mouth at bedtime. ) 30 tablet 0    Musculoskeletal: Strength & Muscle Tone: within normal limits Gait & Station: normal Patient leans: N/A  Psychiatric Specialty Exam: Review of Systems  Unable to perform  ROS: mental acuity    Blood pressure 146/102, pulse 108, temperature 99.9 F (37.7 C), temperature source Oral, resp. rate 20, SpO2 100 %.There is no weight on file to calculate BMI.  General Appearance: Casual and Disheveled  Eye Contact::  Good  Speech:  Pressured and very loud  Volume:  Increased  Mood:  Angry, Anxious, Euphoric and Irritable  Affect:  Congruent, Labile and Full Range  Thought Process:  Disorganized, Loose and Tangential  Orientation:  Full (Time, Place, and Person)  Thought Content:  unable to obtain, patient is Manic, unable to finish a sentense  Suicidal Thoughts:  unable to obtain  Homicidal Thoughts:  unable to obtain  Memory:  Immediate;   Poor Recent;   Poor Remote;   Poor  Judgement:  Poor  Insight:  Lacking  Psychomotor Activity:  Increased  Concentration:  Poor  Recall:  Poor  Fund of Knowledge:Poor  Language: Poor  Akathisia:  No  Handed:  Right  AIMS (if indicated):     Assets:  Others:  unable obtain  ADL's:  Impaired  Cognition: Impaired,  Severe  Sleep:      Treatment Plan Summary: Daily contact with patient to assess and evaluate symptoms and progress in treatment and Medication management  Disposition:  Accepted for admission and we will be seeking placement at any facility with available bed.  We have resumed her home medications.  Delfin Gant, NP   PMHNP-BC 07/16/2015 11:54 AM Patient seen face-to-face for psychiatric evaluation, chart reviewed and case discussed with the physician extender and developed treatment plan. Reviewed the information documented and agree with the treatment plan. Corena Pilgrim, MD

## 2015-07-16 NOTE — Progress Notes (Addendum)
Pt alert and verbal. Speech is loud, rapid, pressured and tangential. Pt is disorganized in thought process. Stated "they're trying to kill me out here, my sister is a Community education officermillionaire, that fucking bitch needs to go back to [place] I don't want her" "y'all got all these fucking crackers and shit in here". Pt has been very intrusive, physically and verbally, observed going into peer's rooms, refusing to come out when redirected. Uncooperative with redirections at present. OTO of Geodon 20 mg IM, Ativan 2 mg IM and Benadryl 50 mg IM given as prescribed at 0946 and pt was compliant. Pt's mood remains labile. She continues to pace hall and is still verbally abusive towards nursing staff and security officers "shut the fuck up". Q 15 minutes checks maintained for safety without self harm gestures to note at this time. Will continue to monitor pt stabilization and safety.
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
## 2015-07-16 NOTE — BH Assessment (Signed)
[facility] Assessment Progress Note  Per Thedore MinsMojeed Akintayo, MD, this pt requires psychiatric hospitalization at this time.  After arriving in the ED, pt was served with IVC documents initiated by her mother.  EDP Benjiman CoreNathan Pickering, MD has upheld IVC.  At 14:40 [name] calls from [facility] to report that pt has been accepted to their facility by Dr [name].  They will be ready to receive her after 15:00.  Barbara ByesJosephine Onuoha, NP, concurs with this decision.  Pt's nurse, [name], has been notified, and agrees to call report to [phone].  Pt is to be transported via Patent examinerlaw enforcement.  Barbara Canninghomas Lynnwood Beckford, MA Triage Specialist [phone]
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
## 2015-11-10 ENCOUNTER — Encounter (HOSPITAL_COMMUNITY): Payer: Self-pay | Admitting: Emergency Medicine

## 2015-11-10 ENCOUNTER — Ambulatory Visit (HOSPITAL_COMMUNITY)
Admission: EM | Admit: 2015-11-10 | Discharge: 2015-11-10 | Disposition: A | Discharge status: Home / Self Care | Payer: Medicare Other | Attending: Internal Medicine | Admitting: Internal Medicine

## 2015-11-10 DIAGNOSIS — R55 Syncope and collapse: Secondary | ICD-10-CM

## 2015-11-10 NOTE — ED Provider Notes (Signed)
[facility]    CSN: [id number] Arrival date & time: 11/10/15  1215  First Provider Contact:  None       History   Chief Complaint Chief Complaint  Patient presents with  . Near Syncope    HPI [name] is a 40 y.o. female. She presents today after having a presyncopal episode at work yesterday, had to leave a little bit early. She works at the [employer], and was going from a refrigerated area out into the heat, and became a little lightheaded. She did not actually pass out or fall, but said that a coworker had to steady her on her feet. No injury reported. 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 The patient believes that she felt a little lightheaded yesterday because she had not actually eaten or drunk anything all day prior to going to work. She has eaten today. No chest discomfort, no breathlessness, no headache. No nausea/vomiting/diarrhea.  HPI  Past Medical History:  Diagnosis Date  . Anxiety   . Bipolar affective (HCC)   . OCD (obsessive compulsive disorder)     Patient Active Problem List   Diagnosis Date Noted  . Mood disorder (HCC)   . Affective psychosis, bipolar (HCC)   . Bipolar affective disorder, currently manic, severe, with psychotic features (HCC) 09/24/2014  . Homicidal ideation   . Bipolar disorder, current episode manic w/o psychotic features, severe (HCC) 08/21/2014  . Cannabis use disorder, moderate, dependence (HCC) 08/21/2014  . Tobacco use disorder 08/21/2014    History reviewed. No pertinent surgical history.   Home Medications    Prior to Admission medications   Medication Sig Start Date End Date Taking? Authorizing Provider  benztropine (COGENTIN) 1 MG tablet Take 1 tablet (1 mg total) by mouth 2 (two) times daily. Patient taking differently: Take 1 mg by mouth at bedtime.  10/02/14   Beau Fanny, FNP  carbamazepine (TEGRETOL XR) 200 MG 12 hr tablet Take 1 tablet (200 mg  total) by mouth 2 (two) times daily. Patient not taking: Reported on 03/17/2015 10/02/14   Beau Fanny, FNP  haloperidol (HALDOL) 5 MG tablet Take 1 tablet (5 mg total) by mouth 2 (two) times daily. Patient not taking: Reported on 03/17/2015 10/02/14   Beau Fanny, FNP  hydrOXYzine (ATARAX/VISTARIL) 25 MG tablet Take 1 tablet (25 mg total) by mouth every 4 (four) hours as needed for anxiety. Patient not taking: Reported on 11/10/2014 10/02/14   Beau Fanny, FNP  lamoTRIgine (LAMICTAL) 25 MG tablet Take 50 mg by mouth at bedtime.    Historical Provider, MD  LORazepam (ATIVAN) 1 MG tablet Take 1 tablet (1 mg total) by mouth 2 (two) times daily. To be titrated by outpatient provider at first appointment Patient not taking: Reported on 03/17/2015 10/02/14   Beau Fanny, FNP  lurasidone (LATUDA) 40 MG TABS tablet Take 40 mg by mouth at bedtime.    Historical Provider, MD  traZODone (DESYREL) 50 MG tablet Take 1 tablet (50 mg total) by mouth at bedtime and may repeat dose one time if needed. Patient taking differently: Take 50 mg by mouth at bedtime.  10/02/14   Beau Fanny, FNP    Family History Family History  Problem Relation Age of Onset  . Drug abuse Father   . Mental illness Other     Social History Social History  Substance Use Topics  . Smoking status: Current Every  Day Smoker    Packs/day: 1.00    Years: 1.00    Types: Cigarettes  . Smokeless tobacco: Never Used  . Alcohol use Yes     Comment: socially     Allergies   Depakote [divalproex sodium] and Haldol [haloperidol]   Review of Systems Review of Systems  All other systems reviewed and are negative.    Physical Exam Triage Vital Signs ED Triage Vitals  Enc Vitals Group     BP 11/10/15 1230 111/78     Pulse Rate 11/10/15 1230 93     Resp 11/10/15 1230 18     Temp 11/10/15 1230 99 F (37.2 C)     Temp Source 11/10/15 1230 Oral     SpO2 11/10/15 1230 100 %     Weight --      Height --      Pain Score  11/10/15 1231 0   Orthostatic VS for the past 24 hrs:  BP- Lying Pulse- Lying BP- Sitting Pulse- Sitting BP- Standing at 0 minutes Pulse- Standing at 0 minutes  11/10/15 1240 103/68 83 115/79 89 101/68 111    Updated Vital Signs BP 111/78 (BP Location: Right Arm)   Pulse 93   Temp 99 F (37.2 C) (Oral)   Resp 18   LMP 11/03/2015 (Exact Date)   SpO2 100%  Physical Exam  Constitutional: She is oriented to person, place, and time. No distress.  Alert, nicely groomed Reclining on exam table, able to sit up quickly and easily, unassisted, for exam  HENT:  Head: Atraumatic.  Eyes:  No eye redness/drainage  Neck: Neck supple.  Cardiovascular: Normal rate.   Pulmonary/Chest: No respiratory distress.  Lungs clear, symmetric breath sounds  Abdominal: She exhibits no distension.  Musculoskeletal: Normal range of motion.  No leg swelling  Neurological: She is alert and oriented to person, place, and time.  Speech clear/coherent Gait is steady, no staggering  Skin: Skin is warm and dry.  No cyanosis  Nursing note and vitals reviewed.    UC Treatments / Results   Procedures Procedures (including critical care time)      none   Initial Impression / Assessment and Plan / UC Course  I have reviewed the triage vital signs and the nursing notes.   Final Clinical Impressions(s) / UC Diagnoses   Final diagnoses:  Near syncope       Resolved  Eat and drink regularly. Recheck as needed    Eustace MooreLaura W Davone Shinault, MD 11/15/15 [phone]

## 2015-11-10 NOTE — Discharge Instructions (Addendum)
Eat and drink regularly. Recheck as needed

## 2015-11-10 NOTE — ED Triage Notes (Signed)
[facility]. She stated that yesterday she was at work and got dizzy and nearly fainted. She stated that she feels it was because with her depression she at times does not eat and she had not eaten. She stated that she has no symptoms today.

## 2016-02-18 ENCOUNTER — Emergency Department (HOSPITAL_COMMUNITY)
Admission: EM | Admit: 2016-02-18 | Discharge: 2016-02-19 | Disposition: A | Discharge status: Other Acute Inpatient Hospital | Payer: Medicare Other | Attending: Physician Assistant | Admitting: Physician Assistant

## 2016-02-18 ENCOUNTER — Encounter (HOSPITAL_COMMUNITY): Payer: Self-pay | Admitting: *Deleted

## 2016-02-18 DIAGNOSIS — F17210 Nicotine dependence, cigarettes, uncomplicated: Secondary | ICD-10-CM | POA: Diagnosis not present

## 2016-02-18 DIAGNOSIS — Z79899 Other long term (current) drug therapy: Secondary | ICD-10-CM | POA: Diagnosis not present

## 2016-02-18 DIAGNOSIS — F29 Unspecified psychosis not due to a substance or known physiological condition: Secondary | ICD-10-CM | POA: Diagnosis not present

## 2016-02-18 DIAGNOSIS — Z046 Encounter for general psychiatric examination, requested by authority: Secondary | ICD-10-CM | POA: Diagnosis present

## 2016-02-18 DIAGNOSIS — R000 Tachycardia, unspecified: Secondary | ICD-10-CM | POA: Diagnosis not present

## 2016-02-18 LAB — COMPREHENSIVE METABOLIC PANEL
ALBUMIN: 4.4 g/dL (ref 3.5–5.0)
ALK PHOS: 67 U/L (ref 38–126)
ALT: 15 U/L (ref 14–54)
ANION GAP: 8 (ref 5–15)
AST: 23 U/L (ref 15–41)
BUN: 20 mg/dL (ref 6–20)
CALCIUM: 9.4 mg/dL (ref 8.9–10.3)
CO2: 22 mmol/L (ref 22–32)
CREATININE: 1.58 mg/dL — AB (ref 0.44–1.00)
Chloride: 109 mmol/L (ref 101–111)
GFR calc non Af Amer: 41 mL/min — ABNORMAL LOW (ref >60)
GFR, EST AFRICAN AMERICAN: 47 mL/min — AB (ref >60)
Glucose, Bld: 143 mg/dL — ABNORMAL HIGH (ref 65–99)
POTASSIUM: 3.7 mmol/L (ref 3.5–5.1)
SODIUM: 139 mmol/L (ref 135–145)
TOTAL PROTEIN: 8.5 g/dL — AB (ref 6.5–8.1)
Total Bilirubin: 0.5 mg/dL (ref 0.3–1.2)

## 2016-02-18 LAB — CBC WITH DIFFERENTIAL/PLATELET
BASOS ABS: 0 10*3/uL (ref 0.0–0.1)
BASOS PCT: 0 %
EOS ABS: 0.1 10*3/uL (ref 0.0–0.7)
EOS PCT: 1 %
HCT: 31.2 % — ABNORMAL LOW (ref 36.0–46.0)
Hemoglobin: 10.1 g/dL — ABNORMAL LOW (ref 12.0–15.0)
Lymphocytes Relative: 28 %
Lymphs Abs: 2.5 10*3/uL (ref 0.7–4.0)
MCH: 23.2 pg — ABNORMAL LOW (ref 26.0–34.0)
MCHC: 32.4 g/dL (ref 30.0–36.0)
MCV: 71.6 fL — AB (ref 78.0–100.0)
MONO ABS: 0.9 10*3/uL (ref 0.1–1.0)
Monocytes Relative: 10 %
Neutro Abs: 5.5 10*3/uL (ref 1.7–7.7)
Neutrophils Relative %: 61 %
PLATELETS: 387 10*3/uL (ref 150–400)
RBC: 4.36 MIL/uL (ref 3.87–5.11)
RDW: 19.1 % — AB (ref 11.5–15.5)
WBC: 9.1 10*3/uL (ref 4.0–10.5)

## 2016-02-18 LAB — ETHANOL

## 2016-02-18 LAB — ACETAMINOPHEN LEVEL

## 2016-02-18 LAB — SALICYLATE LEVEL

## 2016-02-18 LAB — I-STAT BETA HCG BLOOD, ED (MC, WL, AP ONLY): I-stat hCG, quantitative: <5 m[IU]/mL (ref <5)

## 2016-02-18 LAB — LITHIUM LEVEL: Lithium Lvl: 0.92 mmol/L (ref 0.60–1.20)

## 2016-02-18 MED ORDER — NICOTINE 21 MG/24HR TD PT24: 21.0000 mg | MEDICATED_PATCH | Freq: Every day | TRANSDERMAL | Status: DC

## 2016-02-18 MED ORDER — ALUM & MAG HYDROXIDE-SIMETH 200-200-20 MG/5ML PO SUSP
30.0000 mL | ORAL | Status: DC | PRN
Start: 2016-02-18 — End: 2016-02-19

## 2016-02-18 MED ORDER — ONDANSETRON HCL 4 MG PO TABS
4.0000 mg | ORAL_TABLET | Freq: Three times a day (TID) | ORAL | Status: DC | PRN
Start: 2016-02-18 — End: 2016-02-19

## 2016-02-18 MED ORDER — ZOLPIDEM TARTRATE 5 MG PO TABS: 5.0000 mg | ORAL_TABLET | Freq: Every evening | ORAL | Status: DC | PRN

## 2016-02-18 MED ORDER — ACETAMINOPHEN 325 MG PO TABS: 650.0000 mg | ORAL_TABLET | ORAL | Status: DC | PRN

## 2016-02-18 MED ORDER — IBUPROFEN 200 MG PO TABS: 600.0000 mg | ORAL_TABLET | Freq: Three times a day (TID) | ORAL | Status: DC | PRN

## 2016-02-18 NOTE — ED Notes (Signed)
Patient is very loud and inappropriate with her conversation with peers and staff. Patient refuses to lower her voice or go to her room at this time. Patient is hard to redirect at this time. Encouragement and support provided and safety maintain. Q 15 min safety checks remain in place and video monitoring.

## 2016-02-18 NOTE — ED Provider Notes (Signed)
[facility] Provider Note   CSN: [id number] Arrival date & time: 02/18/16  1216     History   Chief Complaint Chief Complaint  Patient presents with  . Psychiatric Evaluation    HPI [name] is a 40 y.o. female.  [name] is a 40 y.o. female with h/o affective psychosis, OCD, bipolar disorder, anxiety, cannabis use, and tobacco use presents to ED for psychiatric evaluation. Per nursing notes pt brought in by [facility] ACT team - believe pt has not been taking their medication for over a month. Noted to have difficulty focusing and following directions. When asked patient "what brings you in?" She states "I need milk, I have kids to feed" and states "people are putting roaches in my food." When asked if she knows where is she states "psych ward." Patient denies SI/HI, V/A hallucinations. However, patient points to the hall and states "that's my ACT team," and no one is standing there. She endorses drinking alcohol - 1 bottle of wine on the weekends, tobacco use, and marijuana use. Denies any other drug use. Patient reports she has a history of bipolar disorder, has been hospitalized in the past, and reports she takes lithium. She denies missing any doses. No medical complaints at this time.       Past Medical History:  Diagnosis Date  . Anxiety   . Bipolar affective (HCC)   . OCD (obsessive compulsive disorder)     Patient Active Problem List   Diagnosis Date Noted  . Mood disorder (HCC)   . Affective psychosis, bipolar (HCC)   . Bipolar affective disorder, currently manic, severe, with psychotic features (HCC) 09/24/2014  . Homicidal ideation   . Bipolar disorder, current episode manic w/o psychotic features, severe (HCC) 08/21/2014  . Cannabis use disorder, moderate, dependence (HCC) 08/21/2014  . Tobacco use disorder 08/21/2014    History reviewed. No pertinent surgical history.  OB History    No data available       Home  Medications    Prior to Admission medications   Medication Sig Start Date End Date Taking? Authorizing Provider  benztropine (COGENTIN) 1 MG tablet Take 1 tablet (1 mg total) by mouth 2 (two) times daily. Patient not taking: Reported on 02/18/2016 10/02/14   Beau Fanny, FNP  carbamazepine (TEGRETOL XR) 200 MG 12 hr tablet Take 1 tablet (200 mg total) by mouth 2 (two) times daily. Patient not taking: Reported on 02/18/2016 10/02/14   Beau Fanny, FNP  haloperidol (HALDOL) 5 MG tablet Take 1 tablet (5 mg total) by mouth 2 (two) times daily. Patient not taking: Reported on 02/18/2016 10/02/14   Beau Fanny, FNP  hydrOXYzine (ATARAX/VISTARIL) 25 MG tablet Take 1 tablet (25 mg total) by mouth every 4 (four) hours as needed for anxiety. Patient not taking: Reported on 02/18/2016 10/02/14   Beau Fanny, FNP  lamoTRIgine (LAMICTAL) 25 MG tablet Take 50 mg by mouth at bedtime.    Historical Provider, MD  LORazepam (ATIVAN) 1 MG tablet Take 1 tablet (1 mg total) by mouth 2 (two) times daily. To be titrated by outpatient provider at first appointment Patient not taking: Reported on 02/18/2016 10/02/14   Beau Fanny, FNP  lurasidone (LATUDA) 40 MG TABS tablet Take 40 mg by mouth at bedtime.    Historical Provider, MD  traZODone (DESYREL) 50 MG tablet Take 1 tablet (50 mg total) by mouth at bedtime and may repeat dose one time if needed. Patient not  taking: Reported on 02/18/2016 10/02/14   Beau FannyJohn C Withrow, FNP    Family History Family History  Problem Relation Age of Onset  . Drug abuse Father   . Mental illness Other     Social History Social History  Substance Use Topics  . Smoking status: Current Every Day Smoker    Packs/day: 1.00    Years: 1.00    Types: Cigarettes  . Smokeless tobacco: Never Used  . Alcohol use Yes     Comment: socially     Allergies   Depakote [divalproex sodium] and Haldol [haloperidol]   Review of Systems Review of Systems  Constitutional: Negative  for fever.  HENT: Negative for trouble swallowing.   Eyes: Negative for visual disturbance.  Respiratory: Negative for shortness of breath.   Cardiovascular: Negative for chest pain.  Gastrointestinal: Negative for abdominal pain, diarrhea, nausea and vomiting.  Genitourinary: Negative for dysuria and hematuria.  Musculoskeletal: Negative for arthralgias and myalgias.  Skin: Negative for rash.  Neurological: Negative for dizziness, syncope, facial asymmetry, speech difficulty, weakness and light-headedness.  Psychiatric/Behavioral: Negative for suicidal ideas.     Physical Exam Updated Vital Signs BP 125/89 (BP Location: Right Arm)   Pulse 101   Temp 98.5 F (36.9 C) (Oral)   Resp 18   Ht 5\' 6"  (1.676 m)   Wt 90.3 kg   LMP  (LMP Unknown)   SpO2 100%   BMI 32.12 kg/m   Physical Exam  Constitutional: She appears well-developed and well-nourished. No distress.  HENT:  Head: Normocephalic and atraumatic.  Eyes: Conjunctivae and EOM are normal. Right eye exhibits no discharge. Left eye exhibits no discharge. No scleral icterus.  Neck: Normal range of motion and phonation normal. Neck supple. No neck rigidity. Normal range of motion present.  Cardiovascular: Normal rate, regular rhythm, normal heart sounds and intact distal pulses.   No murmur heard. Pulmonary/Chest: Effort normal and breath sounds normal. No stridor. No respiratory distress. She has no wheezes. She has no rales.  Abdominal: Soft. She exhibits no distension. There is no tenderness.  Musculoskeletal: Normal range of motion.  Lymphadenopathy:    She has no cervical adenopathy.  Neurological: She is alert. She is not disoriented. Coordination and gait normal. GCS eye subscore is 4. GCS verbal subscore is 5. GCS motor subscore is 6.  CN III-XII grossly intact. Patient moves all extremities with ease. Strength 5/5 in all extremities. Sensation intact throughout. Patient ambulatory with steady gait.   Skin: Skin is  warm and dry. She is not diaphoretic.  Psychiatric: She expresses no homicidal and no suicidal ideation.  On initial assessment patient yelling "[place]."     ED Treatments / Results  Labs (all labs ordered are listed, but only abnormal results are displayed) Labs Reviewed  COMPREHENSIVE METABOLIC PANEL - Abnormal; Notable for the following:       Result Value   Glucose, Bld 143 (*)    Creatinine, Ser 1.58 (*)    Total Protein 8.5 (*)    GFR calc non Af Amer 41 (*)    GFR calc Af Amer 47 (*)    All other components within normal limits  CBC WITH DIFFERENTIAL/PLATELET - Abnormal; Notable for the following:    Hemoglobin 10.1 (*)    HCT 31.2 (*)    MCV 71.6 (*)    MCH 23.2 (*)    RDW 19.1 (*)    All other components within normal limits  ACETAMINOPHEN LEVEL - Abnormal; Notable for the following:  Acetaminophen (Tylenol), Serum <10 (*)    All other components within normal limits  ETHANOL  LITHIUM LEVEL  SALICYLATE LEVEL  RAPID URINE DRUG SCREEN, HOSP PERFORMED  I-STAT BETA HCG BLOOD, ED ([facility], [facility], [facility] ONLY)    EKG  EKG Interpretation  Date/Time:  Thursday February 18 2016 17:42:47 EST Ventricular Rate:  114 PR Interval:  154 QRS Duration: 70 QT Interval:  316 QTC Calculation: 435 R Axis:   42 Text Interpretation:  Sinus tachycardia Nonspecific T wave abnormality No significant change since last tracing Abnormal ekg Confirmed by Gerhard MunchLOCKWOOD, ROBERT  MD [phone]) on 02/18/2016 6:38:52 PM       Radiology No results found.  Procedures Procedures (including critical care time)  Medications Ordered in ED Medications - No data to display   Initial Impression / Assessment and Plan / ED Course  I have reviewed the triage vital signs and the nursing notes.  Pertinent labs & imaging results that were available during my care of the patient were reviewed by me and considered in my medical decision making (see chart for details).  Clinical Course as of Feb 17 1842  Thu Feb 18, 2016  1730 On re-evaluation patient standing in door laughing.   [AM]    Clinical Course User Index [AM] Lona KettleAshley Laurel Margarethe Virgen, PA-C    Patient presents to ED for psychiatric evaluation. Patient denies SI/HI, V or A hallucinations; although points to bare hallway stating "that's my ACT team" and states "people are putting roaches in my food." Pt has no medical complaints. Patient is afebrile and non-toxic appearing in NAD. Initial vital signs remarkable for slight tachycardia at 101 - review of records show patient persistently mildly tachycardic in low 100s. Lungs CTABL. Abdomen soft, non-tender. No focal neuro deficits on exam. EKG shows no significant changes. Lithium therapeutic. Salicylate, acetaminophen, and ethanol nml. hemoglobgin 10.1; relatively stable compared to previous 10.6 in 05/22/15. Mild elevation in creatinine, pt tolerating PO fluids, continue to encourage fluids. Pregnancy test negative. Labs otherwise unremarkable. Pt medically cleared. Dispo pending TTS. IVC paperwork initiated for acute psychosis.   Pt meets inpatient criteria. [facility] [place].   Final Clinical Impressions(s) / ED Diagnoses   Final diagnoses:  Medical clearance for psychiatric admission    New Prescriptions New Prescriptions   No medications on file     Lona Kettleshley Laurel Lamont Glasscock, PA-C 02/18/16 1844    Courteney Randall AnLyn Mackuen, MD 02/21/16 1928

## 2016-02-18 NOTE — ED Triage Notes (Signed)
Patient is alert with confusion.  She was brought in by [facility] ACT team.  Her representative states that he believes that she has not been taking her medications for over a month.  Per her records she has been taking Lithium 450mg  BID and Ativan 1mg  QD PRN.  Patient is having difficulty focusing and following directions.  She is having magical thinking and states that she is dead.

## 2016-02-18 NOTE — ED Notes (Signed)
Pt reports still being unable to give a urine specimen.

## 2016-02-18 NOTE — ED Notes (Signed)
Received a call from Shawn (covering for social work) notifying me that pt has been accepted by [facility] [place]. Report is to be called to [phone].

## 2016-02-18 NOTE — ED Notes (Signed)
Pt admitted to room #40. Pt speech loud, when asked why she is here pt responds "I don't know." Pt denies SI/HI, blank stare. Pt reports she is taking her Lithium as prescribed. Bizarre behavior "What's up slim shady?" Special checks q 15 mins in place for safety. Video monitoring in place.

## 2016-02-18 NOTE — ED Notes (Signed)
EKG given to Dr. Lockwood. 

## 2016-02-18 NOTE — Progress Notes (Signed)
[facility].  Accepting physician is Dr. Gerome SamGolli.  Call report to [phone].  Representative was [employer].   Please assure that pt. Is under IVC per request from [facility]. Louretta Tantillo K. Sherlon HandingHarris, LCAS-A, LPC-A, [facility]  Counselor 02/18/2016 5:51 PM
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
## 2016-02-18 NOTE — Progress Notes (Signed)
Referral was submitted to the following hospitals. Beds at [facility] facilities are available today. [Referral list for [facility] was used for contact at this time to assure communications]  [facility] [place] [facility] Flann[place] K. Sherlon HandingHarris, LCAS-A, LPC-A, [facility]  Counselor 02/18/2016 4:30 PM

## 2016-02-19 ENCOUNTER — Encounter (HOSPITAL_COMMUNITY): Payer: Self-pay | Admitting: Emergency Medicine

## 2016-02-19 NOTE — ED Notes (Signed)
Pt is loud yelling in her bed "Burn in hell, who's on the cross" When writer tried to assess pt, she yelled "Get the f---out" [facility] per report. Attempted to call the sheriff dept and was told to call back at 0800. Will call again after 0800 and call [facility] to give report. Safety maintained on the unit.

## 2016-02-19 NOTE — ED Notes (Signed)
[facility] [place] [facility] Was told he would call back when they were ready to transport.

## 2016-02-19 NOTE — ED Notes (Addendum)
[facility]. Report called to Zeb ComfortBarbara Johnson. [facility]. [facility]

## 2016-02-20 DIAGNOSIS — F3112 Bipolar disorder, current episode manic without psychotic features, moderate: Secondary | ICD-10-CM | POA: Diagnosis not present

## 2016-02-21 DIAGNOSIS — F319 Bipolar disorder, unspecified: Secondary | ICD-10-CM | POA: Diagnosis not present

## 2016-02-21 DIAGNOSIS — F3112 Bipolar disorder, current episode manic without psychotic features, moderate: Secondary | ICD-10-CM | POA: Diagnosis not present

## 2016-02-21 DIAGNOSIS — R9431 Abnormal electrocardiogram [ECG] [EKG]: Secondary | ICD-10-CM | POA: Diagnosis not present

## 2016-02-22 DIAGNOSIS — F3112 Bipolar disorder, current episode manic without psychotic features, moderate: Secondary | ICD-10-CM | POA: Diagnosis not present

## 2016-02-23 DIAGNOSIS — F3112 Bipolar disorder, current episode manic without psychotic features, moderate: Secondary | ICD-10-CM | POA: Diagnosis not present

## 2016-02-24 DIAGNOSIS — F3112 Bipolar disorder, current episode manic without psychotic features, moderate: Secondary | ICD-10-CM | POA: Diagnosis not present

## 2016-02-25 DIAGNOSIS — F3112 Bipolar disorder, current episode manic without psychotic features, moderate: Secondary | ICD-10-CM | POA: Diagnosis not present

## 2016-08-25 ENCOUNTER — Emergency Department (HOSPITAL_COMMUNITY)
Admission: EM | Admit: 2016-08-25 | Discharge: 2016-08-25 | Disposition: A | Discharge status: Home / Self Care | Payer: Medicare Other | Attending: Emergency Medicine | Admitting: Emergency Medicine

## 2016-08-25 ENCOUNTER — Encounter (HOSPITAL_COMMUNITY): Payer: Self-pay | Admitting: Nurse Practitioner

## 2016-08-25 DIAGNOSIS — G249 Dystonia, unspecified: Secondary | ICD-10-CM | POA: Insufficient documentation

## 2016-08-25 DIAGNOSIS — Y69 Unspecified misadventure during surgical and medical care: Secondary | ICD-10-CM | POA: Insufficient documentation

## 2016-08-25 DIAGNOSIS — R1110 Vomiting, unspecified: Secondary | ICD-10-CM | POA: Insufficient documentation

## 2016-08-25 DIAGNOSIS — Z79899 Other long term (current) drug therapy: Secondary | ICD-10-CM | POA: Diagnosis not present

## 2016-08-25 DIAGNOSIS — T424X2A Poisoning by benzodiazepines, intentional self-harm, initial encounter: Secondary | ICD-10-CM | POA: Diagnosis not present

## 2016-08-25 DIAGNOSIS — R9431 Abnormal electrocardiogram [ECG] [EKG]: Secondary | ICD-10-CM | POA: Diagnosis not present

## 2016-08-25 DIAGNOSIS — T424X5A Adverse effect of benzodiazepines, initial encounter: Secondary | ICD-10-CM | POA: Diagnosis not present

## 2016-08-25 DIAGNOSIS — T782XXA Anaphylactic shock, unspecified, initial encounter: Secondary | ICD-10-CM | POA: Diagnosis not present

## 2016-08-25 DIAGNOSIS — T887XXA Unspecified adverse effect of drug or medicament, initial encounter: Secondary | ICD-10-CM | POA: Insufficient documentation

## 2016-08-25 DIAGNOSIS — R062 Wheezing: Secondary | ICD-10-CM | POA: Diagnosis not present

## 2016-08-25 DIAGNOSIS — T50905A Adverse effect of unspecified drugs, medicaments and biological substances, initial encounter: Secondary | ICD-10-CM

## 2016-08-25 LAB — CBC WITH DIFFERENTIAL/PLATELET
Basophils Absolute: 0 10*3/uL (ref 0.0–0.1)
Basophils Relative: 0 %
EOS ABS: 0 10*3/uL (ref 0.0–0.7)
Eosinophils Relative: 0 %
HCT: 31 % — ABNORMAL LOW (ref 36.0–46.0)
HEMOGLOBIN: 10 g/dL — AB (ref 12.0–15.0)
Lymphocytes Relative: 13 %
Lymphs Abs: 1.2 10*3/uL (ref 0.7–4.0)
MCH: 23.5 pg — ABNORMAL LOW (ref 26.0–34.0)
MCHC: 32.3 g/dL (ref 30.0–36.0)
MCV: 72.8 fL — ABNORMAL LOW (ref 78.0–100.0)
MONOS PCT: 3 %
Monocytes Absolute: 0.3 10*3/uL (ref 0.1–1.0)
NEUTROS PCT: 84 %
Neutro Abs: 7.3 10*3/uL (ref 1.7–7.7)
Platelets: 354 10*3/uL (ref 150–400)
RBC: 4.26 MIL/uL (ref 3.87–5.11)
RDW: 17.7 % — ABNORMAL HIGH (ref 11.5–15.5)
WBC: 8.7 10*3/uL (ref 4.0–10.5)

## 2016-08-25 LAB — BASIC METABOLIC PANEL
Anion gap: 7 (ref 5–15)
BUN: 17 mg/dL (ref 6–20)
CALCIUM: 9.3 mg/dL (ref 8.9–10.3)
CHLORIDE: 107 mmol/L (ref 101–111)
CO2: 24 mmol/L (ref 22–32)
CREATININE: 0.96 mg/dL (ref 0.44–1.00)
Glucose, Bld: 158 mg/dL — ABNORMAL HIGH (ref 65–99)
Potassium: 3.2 mmol/L — ABNORMAL LOW (ref 3.5–5.1)
SODIUM: 138 mmol/L (ref 135–145)

## 2016-08-25 NOTE — ED Provider Notes (Signed)
[facility]-EM[facility]GENCY DEPT Provider Note   CSN: [id number] Arrival date & time: 08/25/16  1707     History   Chief Complaint Chief Complaint  Patient presents with  . Medication Reaction    HPI [name] is a 41 y.o. female.  The history is provided by the patient and medical records.    41 year old female with history of anxiety, bipolar disorder, OCD, mood disorder, presenting to the ED with a medication reaction.  Patient thinks she took Ativan last night and subsequently had some tardive dyskinesia today. [facility] called and when they arrived patient's mouth was "twisted" and she was having trouble talking because of this. She was given IM epinephrine as well as 50 mg of Benadryl and symptoms resolved. Patient reports similar reaction in the past when she was given Haldol. Patient thinks she may have "mixed up" her medications last night and is not sure entirely what she took.  States she is very sensitive to the psychiatric medications. Patient States she feels mildly jittery at this time but she thinks that is from the epinephrine. She is a little bit sleepy from the Benadryl as well.  Past Medical History:  Diagnosis Date  . Anxiety   . Bipolar affective (HCC)   . OCD (obsessive compulsive disorder)     Patient Active Problem List   Diagnosis Date Noted  . Mood disorder (HCC)   . Affective psychosis, bipolar (HCC)   . Bipolar affective disorder, currently manic, severe, with psychotic features (HCC) 09/24/2014  . Homicidal ideation   . Bipolar disorder, current episode manic w/o psychotic features, severe (HCC) 08/21/2014  . Cannabis use disorder, moderate, dependence (HCC) 08/21/2014  . Tobacco use disorder 08/21/2014    History reviewed. No pertinent surgical history.  OB History    No data available       Home Medications    Prior to Admission medications   Medication Sig Start Date End Date Taking? Authorizing Provider  benztropine (COGENTIN) 1 MG tablet  Take 1 tablet (1 mg total) by mouth 2 (two) times daily. 10/02/14  Yes Withrow, Everardo AllJohn C, FNP  traZODone (DESYREL) 50 MG tablet Take 1 tablet (50 mg total) by mouth at bedtime and may repeat dose one time if needed. 10/02/14  Yes Withrow, Everardo AllJohn C, FNP    Family History Family History  Problem Relation Age of Onset  . Drug abuse Father   . Mental illness Other     Social History Social History  Substance Use Topics  . Smoking status: Current Every Day Smoker    Packs/day: 1.00    Years: 1.00    Types: Cigarettes  . Smokeless tobacco: Never Used  . Alcohol use Yes     Comment: socially     Allergies   Haldol [haloperidol]   Review of Systems Review of Systems  Constitutional:       Med rxn  All other systems reviewed and are negative.    Physical Exam Updated Vital Signs BP 115/83 (BP Location: Left Arm)   Pulse (!) 105   Temp 97.8 F (36.6 C)   Resp 18   SpO2 100%   Physical Exam  Constitutional: She is oriented to person, place, and time. She appears well-developed and well-nourished.  HENT:  Head: Normocephalic and atraumatic.  Mouth/Throat: Oropharynx is clear and moist.  No oral swelling or airway compromise, smile is symmetric, normal phonation  Eyes: Conjunctivae and EOM are normal. Pupils are equal, round, and reactive to light.  Neck:  Normal range of motion.  Cardiovascular: Normal rate, regular rhythm and normal heart sounds.   Pulmonary/Chest: Effort normal and breath sounds normal.  Abdominal: Soft. Bowel sounds are normal.  Musculoskeletal: Normal range of motion.  Neurological: She is alert and oriented to person, place, and time.  Skin: Skin is warm and dry.  Psychiatric: She has a normal mood and affect.  Nursing note and vitals reviewed.    ED Treatments / Results  Labs (all labs ordered are listed, but only abnormal results are displayed) Labs Reviewed - No data to display  EKG  EKG Interpretation None       Radiology No results  found.  Procedures Procedures (including critical care time)  Medications Ordered in ED Medications - No data to display   Initial Impression / Assessment and Plan / ED Course  I have reviewed the triage vital signs and the nursing notes.  Pertinent labs & imaging results that were available during my care of the patient were reviewed by me and considered in my medical decision making (see chart for details).  41 year old female here after medication reaction. Upon EMS arrival she had findings of tardive dyskinesia resolved after Benadryl. She was given epinephrine as well with concern for possible allergic component. On arrival here patient is awake, alert, appropriately oriented. She has no symptoms of tardive dyskinesia at this time. Her smile is symmetric, speech is normal goal oriented. She has no complaints currently aside from feeling somewhat jittery from the epinephrine. She reports she thought she was taking Ativan last night, but admits she may have mixed up her medications. She does have history of tardive dyskinesia with psychiatric medications in the past. We'll plan for basic labs, EKG. Given that patient received epinephrine, she will need to be monitored here to ensure no acute complications. She understands this. She is eating and drinking well at this time.  8:21 PM Labs reassuring.  Patient has been observed for total of 4 hours post epinephrine injection. She has not had any adverse reactions. No recurrent symptoms of her tardive dyskinesia. She's been able to eat and drink here without issue. Continues to feel well. Vitals are stable. At this time feel she is stable for discharge. We'll have her follow-up closely with her primary care doctor.  Encouraged to monitor her medications closely for any further side effects or adverse reactions.  Patient denies understanding and agreed with plan of care. She is comfortable going home at this time and appears stable to do so.  Final  Clinical Impressions(s) / ED Diagnoses   Final diagnoses:  Adverse effect of drug, initial encounter    New Prescriptions New Prescriptions   No medications on file     Oletha Blend 08/25/16 2054    Gerhard Munch, MD 08/25/16 [phone]

## 2016-08-25 NOTE — ED Notes (Signed)
Bed: [id number] Expected date:  Expected time:  Means of arrival:  Comments: 41 yo tardive dyskinesia, given epi

## 2016-08-25 NOTE — Discharge Instructions (Signed)
Be careful with your medications at home, monitor for any adverse reactions. Follow-up with your primary care doctor. Return here for any new/worsening symptoms
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
## 2016-12-12 ENCOUNTER — Emergency Department (HOSPITAL_COMMUNITY)
Admission: EM | Admit: 2016-12-12 | Discharge: 2016-12-13 | Disposition: A | Discharge status: Home / Self Care | Payer: Medicare Other | Attending: Emergency Medicine | Admitting: Emergency Medicine

## 2016-12-12 ENCOUNTER — Emergency Department (HOSPITAL_COMMUNITY): Payer: Medicare Other

## 2016-12-12 ENCOUNTER — Encounter (HOSPITAL_COMMUNITY): Payer: Self-pay

## 2016-12-12 DIAGNOSIS — Z79899 Other long term (current) drug therapy: Secondary | ICD-10-CM | POA: Insufficient documentation

## 2016-12-12 DIAGNOSIS — F17210 Nicotine dependence, cigarettes, uncomplicated: Secondary | ICD-10-CM | POA: Insufficient documentation

## 2016-12-12 DIAGNOSIS — R45851 Suicidal ideations: Secondary | ICD-10-CM

## 2016-12-12 DIAGNOSIS — R45850 Homicidal ideations: Secondary | ICD-10-CM

## 2016-12-12 DIAGNOSIS — E860 Dehydration: Secondary | ICD-10-CM | POA: Diagnosis not present

## 2016-12-12 DIAGNOSIS — R451 Restlessness and agitation: Secondary | ICD-10-CM

## 2016-12-12 DIAGNOSIS — Z599 Problem related to housing and economic circumstances, unspecified: Secondary | ICD-10-CM | POA: Diagnosis not present

## 2016-12-12 DIAGNOSIS — R918 Other nonspecific abnormal finding of lung field: Secondary | ICD-10-CM | POA: Diagnosis not present

## 2016-12-12 DIAGNOSIS — R404 Transient alteration of awareness: Secondary | ICD-10-CM | POA: Diagnosis not present

## 2016-12-12 DIAGNOSIS — F4329 Adjustment disorder with other symptoms: Secondary | ICD-10-CM | POA: Diagnosis not present

## 2016-12-12 DIAGNOSIS — R4587 Impulsiveness: Secondary | ICD-10-CM | POA: Diagnosis not present

## 2016-12-12 DIAGNOSIS — R55 Syncope and collapse: Secondary | ICD-10-CM

## 2016-12-12 DIAGNOSIS — F3113 Bipolar disorder, current episode manic without psychotic features, severe: Secondary | ICD-10-CM

## 2016-12-12 DIAGNOSIS — Z560 Unemployment, unspecified: Secondary | ICD-10-CM | POA: Diagnosis not present

## 2016-12-12 LAB — RAPID URINE DRUG SCREEN, HOSP PERFORMED
AMPHETAMINES: NOT DETECTED
BENZODIAZEPINES: NOT DETECTED
Barbiturates: NOT DETECTED
COCAINE: NOT DETECTED
OPIATES: NOT DETECTED
TETRAHYDROCANNABINOL: NOT DETECTED

## 2016-12-12 LAB — HEPATIC FUNCTION PANEL
ALK PHOS: 57 U/L (ref 38–126)
ALT: 13 U/L — AB (ref 14–54)
AST: 19 U/L (ref 15–41)
Albumin: 4.3 g/dL (ref 3.5–5.0)
BILIRUBIN DIRECT: 0.1 mg/dL (ref 0.1–0.5)
BILIRUBIN TOTAL: 0.4 mg/dL (ref 0.3–1.2)
Indirect Bilirubin: 0.3 mg/dL (ref 0.3–0.9)
Total Protein: 8.5 g/dL — ABNORMAL HIGH (ref 6.5–8.1)

## 2016-12-12 LAB — BASIC METABOLIC PANEL
Anion gap: 11 (ref 5–15)
BUN: 20 mg/dL (ref 6–20)
CHLORIDE: 108 mmol/L (ref 101–111)
CO2: 18 mmol/L — ABNORMAL LOW (ref 22–32)
CREATININE: 1.08 mg/dL — AB (ref 0.44–1.00)
Calcium: 9 mg/dL (ref 8.9–10.3)
GFR calc Af Amer: >60 mL/min (ref >60)
GLUCOSE: 113 mg/dL — AB (ref 65–99)
Potassium: 3.8 mmol/L (ref 3.5–5.1)
SODIUM: 137 mmol/L (ref 135–145)

## 2016-12-12 LAB — URINALYSIS, ROUTINE W REFLEX MICROSCOPIC
BILIRUBIN URINE: NEGATIVE
Glucose, UA: NEGATIVE mg/dL
Hgb urine dipstick: NEGATIVE
Ketones, ur: 20 mg/dL — AB
Leukocytes, UA: NEGATIVE
NITRITE: NEGATIVE
PH: 6 (ref 5.0–8.0)
Protein, ur: NEGATIVE mg/dL
SPECIFIC GRAVITY, URINE: 1.013 (ref 1.005–1.030)

## 2016-12-12 LAB — CBC
HEMATOCRIT: 32.4 % — AB (ref 36.0–46.0)
HEMOGLOBIN: 10.7 g/dL — AB (ref 12.0–15.0)
MCH: 25.4 pg — ABNORMAL LOW (ref 26.0–34.0)
MCHC: 33 g/dL (ref 30.0–36.0)
MCV: 76.8 fL — ABNORMAL LOW (ref 78.0–100.0)
Platelets: 346 10*3/uL (ref 150–400)
RBC: 4.22 MIL/uL (ref 3.87–5.11)
RDW: 17.1 % — ABNORMAL HIGH (ref 11.5–15.5)
WBC: 11.9 10*3/uL — ABNORMAL HIGH (ref 4.0–10.5)

## 2016-12-12 LAB — SALICYLATE LEVEL: Salicylate Lvl: <7 mg/dL (ref 2.8–30.0)

## 2016-12-12 LAB — D-DIMER, QUANTITATIVE: D-Dimer, Quant: <0.27 ug/mL-FEU (ref 0.00–0.50)

## 2016-12-12 LAB — POC URINE PREG, ED: Preg Test, Ur: NEGATIVE

## 2016-12-12 LAB — ACETAMINOPHEN LEVEL: Acetaminophen (Tylenol), Serum: <10 ug/mL — ABNORMAL LOW (ref 10–30)

## 2016-12-12 LAB — CBG MONITORING, ED: Glucose-Capillary: 122 mg/dL — ABNORMAL HIGH (ref 65–99)

## 2016-12-12 LAB — MAGNESIUM: Magnesium: 2.2 mg/dL (ref 1.7–2.4)

## 2016-12-12 MED ORDER — LORAZEPAM 2 MG/ML IJ SOLN
1.0000 mg | Freq: Once | INTRAMUSCULAR | Status: AC
  Administered 2016-12-12: 1 mg via INTRAVENOUS
  Filled 2016-12-12: qty 1

## 2016-12-12 MED ORDER — SODIUM CHLORIDE 0.9 % IV BOLUS (SEPSIS)
1000.0000 mL | Freq: Once | INTRAVENOUS | Status: AC
  Administered 2016-12-12: 1000 mL via INTRAVENOUS

## 2016-12-12 MED ORDER — DIPHENHYDRAMINE HCL 50 MG/ML IJ SOLN
50.0000 mg | Freq: Once | INTRAMUSCULAR | Status: AC
  Administered 2016-12-12: 50 mg via INTRAVENOUS
  Filled 2016-12-12: qty 1

## 2016-12-12 NOTE — ED Triage Notes (Signed)
Pt presents via EMS from home with c/o syncopal episode. Pt's mom reported that she witnessed the syncopal episode, mom said there was a positive LOC. Per EMS, she was diaphoretic when they arrived. Mom reported that pt has not been compliant with her meds recently and was acting manic this morning and yelling in the house.

## 2016-12-12 NOTE — ED Notes (Signed)
MD at bedside to speak with pt. Pt asked the doctor, "can you just kill me?" Per the physician, pt is making statements about hurting her mother and is laughing at inappropriate times.

## 2016-12-12 NOTE — ED Notes (Signed)
Patient sleeping in room. Family member in room as well. Patient awoke and allowed this staff member to take CBG.

## 2016-12-12 NOTE — ED Notes (Signed)
Pt is very agitated with staff while triaging. Reports that she just wants to go to sleep. Pt reports that she needs benadryl because of the side effects she is having from her psych meds at this time. Pt raises her voice during the triage.

## 2016-12-12 NOTE — ED Notes (Signed)
Pt was to tender to have a bladder scan done. informed DR Preston Fleeting and was informed  not to do the  bladder scan

## 2016-12-12 NOTE — ED Notes (Signed)
Bed: [facility]ALD Expected date:  Expected time:  Means of arrival:  Comments: 

## 2016-12-12 NOTE — ED Notes (Addendum)
While at bedside giving patient fluids and medication, pt is talking out of her head and is cussing at her mother and at this RN. Pt is speaking a flight of ideas and does not make a lot of sense. Her agitation is increasing. MD will be made aware.

## 2016-12-12 NOTE — ED Provider Notes (Signed)
[facility]-EMERGENCY DEPT Provider Note   CSN: [id number] Arrival date & time: 12/12/16  1239     History   Chief Complaint No chief complaint on file. SI, HI, Mania, Syncope  HPI [name] is a 41 y.o. female.  The history is provided by the patient, a parent and medical records.  Mental Health Problem  Presenting symptoms: aggressive behavior, agitation, homicidal ideas and suicidal thoughts   Presenting symptoms: no depression, no suicidal threats and no suicide attempt   Patient accompanied by:  Parent Degree of incapacity (severity):  Severe Onset quality:  Gradual Duration:  1 week Timing:  Intermittent Progression:  Waxing and waning Chronicity:  New Context: noncompliance   Context: not medication and not recent medication change   Treatment compliance:  Untreated Relieved by:  Nothing Worsened by:  Nothing Ineffective treatments:  None tried Associated symptoms: anxiety   Associated symptoms: no abdominal pain, no chest pain and no fatigue   Risk factors: hx of mental illness     Past Medical History:  Diagnosis Date  . Anxiety   . Bipolar affective (HCC)   . OCD (obsessive compulsive disorder)     Patient Active Problem List   Diagnosis Date Noted  . Mood disorder (HCC)   . Affective psychosis, bipolar (HCC)   . Bipolar affective disorder, currently manic, severe, with psychotic features (HCC) 09/24/2014  . Homicidal ideation   . Bipolar disorder, current episode manic w/o psychotic features, severe (HCC) 08/21/2014  . Cannabis use disorder, moderate, dependence (HCC) 08/21/2014  . Tobacco use disorder 08/21/2014    History reviewed. No pertinent surgical history.  OB History    No data available       Home Medications    Prior to Admission medications   Medication Sig Start Date End Date Taking? Authorizing Provider  benztropine (COGENTIN) 1 MG tablet Take 1 mg by mouth at bedtime. 02/25/16  Yes [provider]  lithium  carbonate 300 MG capsule Take 300 mg by mouth 2 (two) times daily. 02/25/16  Yes [provider]  LORazepam (ATIVAN) 1 MG tablet Take 1 mg by mouth 2 (two) times daily.   Yes [provider]  risperidone (RISPERDAL) 4 MG tablet Take 4 mg by mouth 2 (two) times daily.   Yes [provider]  traZODone (DESYREL) 100 MG tablet Take 100 mg by mouth at bedtime. 02/25/16  Yes [provider]    Family History Family History  Problem Relation Age of Onset  . Drug abuse Father   . Mental illness Other     Social History Social History  Substance Use Topics  . Smoking status: Current Every Day Smoker    Packs/day: 1.00    Years: 1.00    Types: Cigarettes  . Smokeless tobacco: Never Used  . Alcohol use Yes     Comment: socially     Allergies   Haldol [haloperidol]   Review of Systems Review of Systems  Constitutional: Negative for chills, diaphoresis, fatigue and fever.  HENT: Negative for congestion.   Eyes: Negative for visual disturbance.  Respiratory: Negative for chest tightness, shortness of breath, wheezing and stridor.   Cardiovascular: Negative for chest pain, palpitations and leg swelling.  Gastrointestinal: Negative for abdominal pain, constipation, diarrhea, nausea and vomiting.  Genitourinary: Negative for dysuria, flank pain, vaginal bleeding, vaginal discharge and vaginal pain.  Musculoskeletal: Negative for back pain, neck pain and neck stiffness.  Skin: Negative for rash and wound.  Neurological: Positive for syncope.  Negative for dizziness, seizures, light-headedness and numbness.  Psychiatric/Behavioral: Positive for agitation, homicidal ideas and suicidal ideas. Negative for confusion. The patient is nervous/anxious.      Physical Exam Updated Vital Signs BP 135/73   Pulse 99   Temp 98.4 F (36.9 C) (Oral)   Resp (!) 21   LMP  (LMP Unknown)   SpO2 99%   Physical Exam  Constitutional: She is oriented to person,  place, and time. She appears well-developed and well-nourished. No distress.  HENT:  Head: Normocephalic and atraumatic.  Mouth/Throat: Oropharynx is clear and moist. No oropharyngeal exudate.  Eyes: Pupils are equal, round, and reactive to light. Conjunctivae and EOM are normal.  Neck: Normal range of motion. Neck supple.  Cardiovascular: Regular rhythm.  Tachycardia present.   No murmur heard. Pulmonary/Chest: Effort normal and breath sounds normal. No stridor. No respiratory distress.  Abdominal: Soft. There is no tenderness. There is no guarding.  Musculoskeletal: She exhibits no edema or tenderness.  Neurological: She is alert and oriented to person, place, and time. No cranial nerve deficit or sensory deficit. She exhibits normal muscle tone.  Skin: Skin is warm and dry. Capillary refill takes less than 2 seconds. No rash noted. She is not diaphoretic. No erythema.  Psychiatric: Her mood appears anxious. Her affect is angry. Her speech is rapid and/or pressured and tangential. She is agitated.  Nursing note and vitals reviewed.    ED Treatments / Results  Labs (all labs ordered are listed, but only abnormal results are displayed) Labs Reviewed  BASIC METABOLIC PANEL - Abnormal; Notable for the following:       Result Value   CO2 18 (*)    Glucose, Bld 113 (*)    Creatinine, Ser 1.08 (*)    All other components within normal limits  CBC - Abnormal; Notable for the following:    WBC 11.9 (*)    Hemoglobin 10.7 (*)    HCT 32.4 (*)    MCV 76.8 (*)    MCH 25.4 (*)    RDW 17.1 (*)    All other components within normal limits  URINALYSIS, ROUTINE W REFLEX MICROSCOPIC - Abnormal; Notable for the following:    Ketones, ur 20 (*)    All other components within normal limits  HEPATIC FUNCTION PANEL - Abnormal; Notable for the following:    Total Protein 8.5 (*)    ALT 13 (*)    All other components within normal limits  ACETAMINOPHEN LEVEL - Abnormal; Notable for the following:     Acetaminophen (Tylenol), Serum <10 (*)    All other components within normal limits  CBG MONITORING, ED - Abnormal; Notable for the following:    Glucose-Capillary 122 (*)    All other components within normal limits  RAPID URINE DRUG SCREEN, HOSP PERFORMED  MAGNESIUM  D-DIMER, QUANTITATIVE (NOT AT [facility])  SALICYLATE LEVEL  POC URINE PREG, ED    EKG  EKG Interpretation  Date/Time:  Monday December 12 2016 13:05:53 EDT Ventricular Rate:  127 PR Interval:    QRS Duration: 78 QT Interval:  315 QTC Calculation: 458 R Axis:   80 Text Interpretation:  Sinus tachycardia Borderline repolarization abnormality When compared to prior, new S1 and inverted T wave in lead 3. sinus tachycardia.  No STEMI Confirmed by Theda Belfast ([id number]) on 12/12/2016 3:03:31 PM       Radiology Dg Chest Port 1 View  Result Date: 12/12/2016 CLINICAL DATA:  AP, tachycardia, bipolar affective disorder, non compliant with  medication. EXAM: PORTABLE CHEST 1 VIEW COMPARISON:  None in PACs FINDINGS: The lungs are hypoinflated but clear. The heart and pulmonary vascularity are normal. The mediastinum is normal in width. The bony thorax exhibits no acute abnormality. IMPRESSION: Bilateral hypoinflation. No evidence of aspiration or other acute cardiopulmonary abnormality. Electronically Signed   By: David  [place] M.D.   On: 12/12/2016 16:31    Procedures Procedures (including critical care time)  Medications Ordered in ED Medications  sodium chloride 0.9 % bolus 1,000 mL (0 mLs Intravenous Stopped 12/12/16 1748)  sodium chloride 0.9 % bolus 1,000 mL (0 mLs Intravenous Stopped 12/12/16 1748)  diphenhydrAMINE (BENADRYL) injection 50 mg (50 mg Intravenous Given 12/12/16 1528)  LORazepam (ATIVAN) injection 1 mg (1 mg Intravenous Given 12/12/16 1529)  sodium chloride 0.9 % bolus 1,000 mL (1,000 mLs Intravenous New Bag/Given 12/12/16 2149)  sodium chloride 0.9 % bolus 1,000 mL (1,000 mLs Intravenous New Bag/Given 12/12/16  2148)  LORazepam (ATIVAN) injection 1 mg (1 mg Intravenous Given 12/12/16 2218)     Initial Impression / Assessment and Plan / ED Course  I have reviewed the triage vital signs and the nursing notes.  Pertinent labs & imaging results that were available during my care of the patient were reviewed by me and considered in my medical decision making (see chart for details).     [name] is a 41 y.o. female with a past edical history significant for bipolar disorder, OCD, anxiety, and adverse reaction to ant psychiatric medications who presents with syncope, agitation, tachycardia, SI, and HI. Patient is brought in by her mother. Patient reports that she has not been on her medication recently. She says that she recently moved into her mother's house over the last week.Patient was arguing with her mother when she went out to her car to smoke cigarettes. Mother reports that the patient has not slept in several days and has not been eating. After smoking cigarettes, patientthen syncopized in the street. Patient denies falling and hitting her head. Following this, patient was brought to the ED for evaluation. Mother reports that patient has been agitated and is "manic".  Patient says that she is having thoughts of killing her mother. After saying this, patient left very loudly. Patient also wanted this examiner to "just kill me now". Patient reports that she has had the suicidal thoughts for the last day. Patient denies audiovisual hallucinations however, when arguing and yelling agitated, patient is looking in corners of the room were no understanding. Clinical concern present for hallucinations.  Patient reports that she needs Benadryl because she is having a reaction with her mouth that she has had before with her medications.  Patient also found to be tachycardic on arrival. Heart rate between 115 and 150. Patient is unsure which medication she took today but she says she took 2 of the  medicines before her syncopal episode. She said that she passed out because she did not eat today. She denies any nausea, vomiting, fevers, chills, chest pain, palpitations, shortness of breath. She denies nausea, vomiting, change in bowel habits or change in urination. She denies any other complaints.  She denies coingestants or drugs or alcohol use.  On exam, patient is tachycardic. Patient is agitated and speaking quickly with flight of ideas. Patient perseverates on losing her disability because she started working. She says she's had increase in stress. She threatened her mother and is yelling. She had no focal neurologic deficits on my exam. Lungs are clear. Chest is  nontender. Abdomen nontender.  Patient will have screening laboratory testing. Given report of decreased oral intake, she'll be given fluids for rehydration. Patient will be given Benadryl as this reportedly helped her mouth symptoms in the past. Patient also given Ativan as she has a history of using this with her agitation.  Due to the patient's suicidal ideation and homicidal threats, patient was placed under involuntary commitment as she also appears to be psychotic and manic at this time.  Anticipate monitoring patient to determine when she is medically clear for behavioral health to evaluate.  9:18 PM As I was going to reassess patient, patient was found standing in the middle the room urinating on the ground.  Patient's heart rate was in the 140s during this episode. Patient reports to still feel dehydrated and has a dry mouth. Patient will be given more fluids. Next  Patient's laboratory testing seen above. Criss Alvine is negative. UDS negative. Salicylate and Tylenol levels normal. Metabolic panel showed similar creatinine 1.08. CO2 decreased at 18. D-dimer data, doubt PE as cause of tachycardia. Urinalysis showed ketones also consistent with dehydration.  Patient's chest x-ray showed no evidence of aspiration or other  cardiac pulmonary abnormality such as pneumonia. Next  Patient was given more fluids and reassessed to determine when she is medically cleared.  Patient was informed of her involuntary commitment and sheWas upset but agreed.  After several hours of observation, patient had no further episodes of tachycardia. Patient resting calmly after fluids. Suspect patient was dehydrated causing the tachycardia. Given her demonstrated stability, patient will be medically cleared for further management by psychiatry. TTS consult will be placed.   Final Clinical Impressions(s) / ED Diagnoses   Final diagnoses:  Suicidal ideation  Homicidal ideation  Agitation  Syncope, unspecified syncope type  Dehydration    Clinical Impression: 1. Suicidal ideation   2. Syncope   3. Homicidal ideation   4. Agitation   5. Syncope, unspecified syncope type   6. Dehydration     Disposition: awaiting psychiatric disposition for SI, HI, and mania      Saadiya Wilfong, Canary Brim, MD 12/13/16 [phone]

## 2016-12-13 DIAGNOSIS — R451 Restlessness and agitation: Secondary | ICD-10-CM | POA: Diagnosis not present

## 2016-12-13 DIAGNOSIS — R55 Syncope and collapse: Secondary | ICD-10-CM | POA: Diagnosis not present

## 2016-12-13 DIAGNOSIS — Z560 Unemployment, unspecified: Secondary | ICD-10-CM

## 2016-12-13 DIAGNOSIS — R4587 Impulsiveness: Secondary | ICD-10-CM

## 2016-12-13 DIAGNOSIS — Z599 Problem related to housing and economic circumstances, unspecified: Secondary | ICD-10-CM

## 2016-12-13 DIAGNOSIS — F4329 Adjustment disorder with other symptoms: Secondary | ICD-10-CM

## 2016-12-13 NOTE — ED Notes (Signed)
Spoke with Harvie Heck, RN; transferred care for TCU. Security escorted to room 31, patient ambulatory to room 31. Patient was being disruptive to staff and other patients entering the facility. IVC papers given to security but no belongings were found at nurses station.

## 2016-12-13 NOTE — [facility] Counselor (Signed)
Per Donell Sievert, PA-C: Continue observation for safety and stabilization.  Defer to disposition for re-evaluation by psychiatric team later in the morning.  Jeneen Montgomery, MD, notified at [phone].

## 2016-12-13 NOTE — BH Assessment (Addendum)
[facility] Assessment Progress Note  Per Thedore Mins, MD, this pt does not require psychiatric hospitalization at this time.  Pt presents under IVC initiated by EDP Lynden Oxford, MD, which Dr Jannifer Franklin has rescinded.  Pt is to be discharged from [facility] with recommendation to continue treatment with the [facility].  This has been included in pt's discharge instructions.  Pt's nurse, Donnal Debar, has been notified.  Doylene Canning, MA Triage Specialist [phone]

## 2016-12-13 NOTE — [facility] Suicide Risk Assessment (Signed)
Suicide Risk Assessment  Discharge Assessment   [facility] Discharge Suicide Risk Assessment   Principal Problem: Adjustment disorder with disturbance of emotion Discharge Diagnoses:  Patient Active Problem List   Diagnosis Date Noted  . Adjustment disorder with disturbance of emotion [F43.29] 12/13/2016  . Agitation [R45.1]   . Mood disorder (HCC) [F39]   . Affective psychosis, bipolar (HCC) [F31.9]   . Bipolar affective disorder, currently manic, severe, with psychotic features (HCC) [F31.2] 09/24/2014  . Homicidal ideation [R45.850]   . Bipolar disorder, current episode manic w/o psychotic features, severe (HCC) [F31.13] 08/21/2014  . Cannabis use disorder, moderate, dependence (HCC) [F12.20] 08/21/2014  . Tobacco use disorder [F17.200] 08/21/2014    Total Time spent with patient: 45 minutes  Musculoskeletal: Strength & Muscle Tone: within normal limits Gait & Station: normal Patient leans: N/A  Psychiatric Specialty Exam:   Blood pressure 130/70, pulse (!) 103, temperature 98.5 F (36.9 C), temperature source Oral, resp. rate 18, SpO2 100 %.There is no height or weight on file to calculate BMI.  General Appearance: Casual  Eye Contact::  Good  Speech:  Pressured409  Volume:  Increased  Mood:  Anxious  Affect:  Congruent  Thought Process:  Coherent, Goal Directed and Linear  Orientation:  Full (Time, Place, and Person)  Thought Content:  Logical  Suicidal Thoughts:  No  Homicidal Thoughts:  No  Memory:  Immediate;   Good Recent;   Good Remote;   Fair  Judgement:  Fair  Insight:  Fair  Psychomotor Activity:  Increased  Concentration:  Good  Recall:  Good  Fund of Knowledge:Good  Language: Good  Akathisia:  No  Handed:  Right  AIMS (if indicated):     Assets:  Architect Housing Physical Health Resilience Social Support  Sleep:     Cognition: WNL  ADL's:  Intact   Mental Status Per Nursing Assessment::   On Admission:    Manic   Demographic Factors:  Low socioeconomic status and Unemployed  Loss Factors: Financial problems/change in socioeconomic status  Historical Factors: Impulsivity  Risk Reduction Factors:   Sense of responsibility to family and Living with another person, especially a relative  Continued Clinical Symptoms:  Bipolar Disorder:   Mixed State Depression:   Impulsivity More than one psychiatric diagnosis Previous Psychiatric Diagnoses and Treatments  Cognitive Features That Contribute To Risk:  Closed-mindedness    Suicide Risk:  Minimal: No identifiable suicidal ideation.  Patients presenting with no risk factors but with morbid ruminations; may be classified as minimal risk based on the severity of the depressive symptoms    Plan Of Care/Follow-up recommendations:  Activity:  as tolerated Diet:  Heart Healthy  Laveda Abbe, NP 12/13/2016, 11:13 AM

## 2016-12-13 NOTE — ED Notes (Signed)
Bed: WA31 Expected date:  Expected time:  Means of arrival:  Comments: 
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
## 2017-02-06 ENCOUNTER — Encounter (HOSPITAL_COMMUNITY): Payer: Self-pay | Admitting: *Deleted

## 2017-02-06 ENCOUNTER — Other Ambulatory Visit: Payer: Self-pay

## 2017-02-06 ENCOUNTER — Emergency Department (HOSPITAL_COMMUNITY)
Admission: EM | Admit: 2017-02-06 | Discharge: 2017-02-08 | Disposition: A | Discharge status: Other Acute Inpatient Hospital | Payer: Medicare Other | Attending: Emergency Medicine | Admitting: Emergency Medicine

## 2017-02-06 DIAGNOSIS — F3113 Bipolar disorder, current episode manic without psychotic features, severe: Secondary | ICD-10-CM | POA: Diagnosis not present

## 2017-02-06 DIAGNOSIS — F419 Anxiety disorder, unspecified: Secondary | ICD-10-CM | POA: Insufficient documentation

## 2017-02-06 DIAGNOSIS — R440 Auditory hallucinations: Secondary | ICD-10-CM | POA: Insufficient documentation

## 2017-02-06 DIAGNOSIS — Z79899 Other long term (current) drug therapy: Secondary | ICD-10-CM | POA: Diagnosis not present

## 2017-02-06 DIAGNOSIS — F17210 Nicotine dependence, cigarettes, uncomplicated: Secondary | ICD-10-CM | POA: Insufficient documentation

## 2017-02-06 DIAGNOSIS — R454 Irritability and anger: Secondary | ICD-10-CM | POA: Diagnosis not present

## 2017-02-06 DIAGNOSIS — Z046 Encounter for general psychiatric examination, requested by authority: Secondary | ICD-10-CM | POA: Diagnosis not present

## 2017-02-06 DIAGNOSIS — R460 Very low level of personal hygiene: Secondary | ICD-10-CM | POA: Diagnosis not present

## 2017-02-06 DIAGNOSIS — F312 Bipolar disorder, current episode manic severe with psychotic features: Secondary | ICD-10-CM | POA: Diagnosis present

## 2017-02-06 DIAGNOSIS — F22 Delusional disorders: Secondary | ICD-10-CM | POA: Diagnosis not present

## 2017-02-06 DIAGNOSIS — Z9114 Patient's other noncompliance with medication regimen: Secondary | ICD-10-CM | POA: Diagnosis not present

## 2017-02-06 LAB — CBC
HEMATOCRIT: 35.3 % — AB (ref 36.0–46.0)
HEMOGLOBIN: 11.8 g/dL — AB (ref 12.0–15.0)
MCH: 25.7 pg — AB (ref 26.0–34.0)
MCHC: 33.4 g/dL (ref 30.0–36.0)
MCV: 76.7 fL — ABNORMAL LOW (ref 78.0–100.0)
Platelets: 505 10*3/uL — ABNORMAL HIGH (ref 150–400)
RBC: 4.6 MIL/uL (ref 3.87–5.11)
RDW: 16.3 % — ABNORMAL HIGH (ref 11.5–15.5)
WBC: 12 10*3/uL — ABNORMAL HIGH (ref 4.0–10.5)

## 2017-02-06 LAB — I-STAT BETA HCG BLOOD, ED (MC, WL, AP ONLY): I-stat hCG, quantitative: <5 m[IU]/mL (ref <5)

## 2017-02-06 MED ORDER — STERILE WATER FOR INJECTION IJ SOLN
INTRAMUSCULAR | Status: AC
  Administered 2017-02-07: 1.2 mL
  Filled 2017-02-06: qty 10

## 2017-02-06 NOTE — ED Notes (Signed)
Bed: WTR9 Expected date:  Expected time:  Means of arrival:  Comments: 

## 2017-02-06 NOTE — ED Notes (Signed)
Unable to collect labs patient sttaed she wants the white girl to collect them
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
## 2017-02-07 DIAGNOSIS — R440 Auditory hallucinations: Secondary | ICD-10-CM | POA: Diagnosis not present

## 2017-02-07 LAB — ETHANOL: Alcohol, Ethyl (B): <10 mg/dL (ref <10)

## 2017-02-07 LAB — COMPREHENSIVE METABOLIC PANEL
ALK PHOS: 66 U/L (ref 38–126)
ALT: 14 U/L (ref 14–54)
ANION GAP: 11 (ref 5–15)
AST: 22 U/L (ref 15–41)
Albumin: 4.8 g/dL (ref 3.5–5.0)
BUN: 21 mg/dL — ABNORMAL HIGH (ref 6–20)
CALCIUM: 9.6 mg/dL (ref 8.9–10.3)
CHLORIDE: 107 mmol/L (ref 101–111)
CO2: 19 mmol/L — AB (ref 22–32)
Creatinine, Ser: 1.3 mg/dL — ABNORMAL HIGH (ref 0.44–1.00)
GFR calc Af Amer: 59 mL/min — ABNORMAL LOW (ref >60)
GFR, EST NON AFRICAN AMERICAN: 51 mL/min — AB (ref >60)
GLUCOSE: 145 mg/dL — AB (ref 65–99)
POTASSIUM: 3.4 mmol/L — AB (ref 3.5–5.1)
SODIUM: 137 mmol/L (ref 135–145)
Total Bilirubin: 0.6 mg/dL (ref 0.3–1.2)
Total Protein: 9.2 g/dL — ABNORMAL HIGH (ref 6.5–8.1)

## 2017-02-07 LAB — T4, FREE: FREE T4: 1.08 ng/dL (ref 0.61–1.12)

## 2017-02-07 LAB — TSH: TSH: 1.464 u[IU]/mL (ref 0.350–4.500)

## 2017-02-07 MED ORDER — ZIPRASIDONE MESYLATE 20 MG IM SOLR
20.0000 mg | Freq: Once | INTRAMUSCULAR | Status: AC
Start: 2017-02-07 — End: 2017-02-07
  Administered 2017-02-07: 20 mg via INTRAMUSCULAR
  Filled 2017-02-07: qty 20

## 2017-02-07 MED ORDER — NICOTINE 21 MG/24HR TD PT24: 21.0000 mg | MEDICATED_PATCH | Freq: Every day | TRANSDERMAL | Status: DC

## 2017-02-07 MED ORDER — DIPHENHYDRAMINE HCL 50 MG/ML IJ SOLN
INTRAMUSCULAR | Status: AC
  Administered 2017-02-07: 10:00:00
  Filled 2017-02-07: qty 1

## 2017-02-07 MED ORDER — DIPHENHYDRAMINE HCL 50 MG/ML IJ SOLN: 50.0000 mg | Freq: Once | INTRAMUSCULAR | Status: DC

## 2017-02-07 MED ORDER — ZIPRASIDONE MESYLATE 20 MG IM SOLR
20.0000 mg | Freq: Once | INTRAMUSCULAR | Status: AC
  Administered 2017-02-07: 20 mg via INTRAMUSCULAR
  Filled 2017-02-07: qty 20

## 2017-02-07 MED ORDER — LORAZEPAM 2 MG/ML IJ SOLN
2.0000 mg | Freq: Once | INTRAMUSCULAR | Status: AC
  Administered 2017-02-07: 2 mg via INTRAMUSCULAR
  Filled 2017-02-07: qty 1

## 2017-02-07 MED ORDER — LORAZEPAM 1 MG PO TABS
1.0000 mg | ORAL_TABLET | Freq: Two times a day (BID) | ORAL | Status: DC
  Administered 2017-02-07 – 2017-02-08 (×2): 1 mg via ORAL
  Filled 2017-02-07 (×2): qty 1

## 2017-02-07 MED ORDER — DIPHENHYDRAMINE HCL 50 MG/ML IJ SOLN
50.0000 mg | Freq: Once | INTRAMUSCULAR | Status: AC
  Administered 2017-02-07: 50 mg via INTRAMUSCULAR
  Filled 2017-02-07: qty 1

## 2017-02-07 MED ORDER — ONDANSETRON HCL 4 MG PO TABS: 4.0000 mg | ORAL_TABLET | Freq: Three times a day (TID) | ORAL | Status: DC | PRN

## 2017-02-07 MED ORDER — BENZTROPINE MESYLATE 1 MG PO TABS
1.0000 mg | ORAL_TABLET | Freq: Once | ORAL | Status: AC
  Administered 2017-02-07: 1 mg via ORAL

## 2017-02-07 MED ORDER — ZIPRASIDONE MESYLATE 20 MG IM SOLR
20.0000 mg | Freq: Once | INTRAMUSCULAR | Status: AC | PRN
  Administered 2017-02-07: 20 mg via INTRAMUSCULAR
  Filled 2017-02-07: qty 20

## 2017-02-07 MED ORDER — BENZTROPINE MESYLATE 1 MG PO TABS
1.0000 mg | ORAL_TABLET | Freq: Every day | ORAL | Status: DC
  Filled 2017-02-07: qty 1

## 2017-02-07 MED ORDER — TRAZODONE HCL 100 MG PO TABS: 100.0000 mg | ORAL_TABLET | Freq: Every day | ORAL | Status: DC

## 2017-02-07 MED ORDER — RISPERIDONE 2 MG PO TABS
4.0000 mg | ORAL_TABLET | Freq: Two times a day (BID) | ORAL | Status: DC
  Administered 2017-02-07: 4 mg via ORAL
  Filled 2017-02-07 (×2): qty 2

## 2017-02-07 MED ORDER — DIPHENHYDRAMINE HCL 50 MG/ML IJ SOLN
50.0000 mg | Freq: Once | INTRAMUSCULAR | Status: AC | PRN
  Administered 2017-02-07: 50 mg via INTRAMUSCULAR
  Filled 2017-02-07: qty 1

## 2017-02-07 MED ORDER — STERILE WATER FOR INJECTION IJ SOLN
INTRAMUSCULAR | Status: AC
  Administered 2017-02-07: 10:00:00
  Filled 2017-02-07: qty 10

## 2017-02-07 NOTE — ED Notes (Signed)
[id number] Expected date:  Expected time:  Means of arrival:  Comments: Triage 9

## 2017-02-07 NOTE — ED Notes (Signed)
Pt A&O x 3, no distress noted.  Pt manic with pressured speech.  Monitoring for safety, Q 15 min checks in effect.  [facility].

## 2017-02-07 NOTE — ED Notes (Signed)
Awake and eating lunch tray. Irritable but not as loud.

## 2017-02-07 NOTE — ED Notes (Signed)
Patient continues to be intrusive,loud and not directable.  She is demanding "cogrntin and benadryl." Report to provider.

## 2017-02-07 NOTE — [facility] Counselor (Signed)
[facility] Kenney [facility]Lindsay [facility], pt has been accepted to [facility] and can be transferred after shift change at 1930. Pt's RN Morrie SheldonAshley aware.    Evette Cristalaroline Paige Paislie Tessler, [place] Therapeutic Triage Specialist
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
## 2017-02-07 NOTE — ED Notes (Signed)
C/O "side effects".  Increased anxiety.  Pulse 140. IM administered per provider order.
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
## 2017-02-07 NOTE — ED Notes (Signed)
Demanding Benadryl.  Fluids given.  VS given.

## 2017-02-07 NOTE — ED Notes (Signed)
Pt under IVC by mother, presents accompanied by [employer] in manic state.  Pt noncompliant with home meds, family feels unsafe around the pt.  A&O x 3, no distress noted, redirectable at present.  Pt given Geodon 20 mg's in triage.  Pressured speech noted.  Pt resting at present.

## 2017-02-07 NOTE — Progress Notes (Signed)
02/07/17 1359:  LRT was informed by staff to let pt sleep.   Caroll RancherMarjette Cueto, LRT/CTRS

## 2017-02-08 ENCOUNTER — Encounter (HOSPITAL_COMMUNITY): Payer: Self-pay | Admitting: *Deleted

## 2017-02-08 ENCOUNTER — Other Ambulatory Visit: Payer: Self-pay

## 2017-02-08 ENCOUNTER — Inpatient Hospital Stay (HOSPITAL_COMMUNITY)
Admission: AD | Admit: 2017-02-08 | Discharge: 2017-02-15 | DRG: 885 | Disposition: A | Discharge status: Home / Self Care | Payer: Medicare Other | Source: Intra-hospital | Attending: Psychiatry | Admitting: Psychiatry

## 2017-02-08 DIAGNOSIS — F419 Anxiety disorder, unspecified: Secondary | ICD-10-CM | POA: Diagnosis not present

## 2017-02-08 DIAGNOSIS — R450 Nervousness: Secondary | ICD-10-CM | POA: Diagnosis not present

## 2017-02-08 DIAGNOSIS — F3113 Bipolar disorder, current episode manic without psychotic features, severe: Secondary | ICD-10-CM | POA: Diagnosis not present

## 2017-02-08 DIAGNOSIS — F1210 Cannabis abuse, uncomplicated: Secondary | ICD-10-CM

## 2017-02-08 DIAGNOSIS — R460 Very low level of personal hygiene: Secondary | ICD-10-CM | POA: Diagnosis not present

## 2017-02-08 DIAGNOSIS — F17210 Nicotine dependence, cigarettes, uncomplicated: Secondary | ICD-10-CM | POA: Diagnosis not present

## 2017-02-08 DIAGNOSIS — Z813 Family history of other psychoactive substance abuse and dependence: Secondary | ICD-10-CM | POA: Diagnosis not present

## 2017-02-08 DIAGNOSIS — R440 Auditory hallucinations: Secondary | ICD-10-CM | POA: Diagnosis not present

## 2017-02-08 DIAGNOSIS — F250 Schizoaffective disorder, bipolar type: Secondary | ICD-10-CM | POA: Diagnosis not present

## 2017-02-08 DIAGNOSIS — F429 Obsessive-compulsive disorder, unspecified: Secondary | ICD-10-CM | POA: Diagnosis present

## 2017-02-08 DIAGNOSIS — Z818 Family history of other mental and behavioral disorders: Secondary | ICD-10-CM

## 2017-02-08 DIAGNOSIS — Z79899 Other long term (current) drug therapy: Secondary | ICD-10-CM

## 2017-02-08 DIAGNOSIS — G4700 Insomnia, unspecified: Secondary | ICD-10-CM

## 2017-02-08 DIAGNOSIS — R451 Restlessness and agitation: Secondary | ICD-10-CM | POA: Diagnosis present

## 2017-02-08 DIAGNOSIS — R454 Irritability and anger: Secondary | ICD-10-CM | POA: Diagnosis not present

## 2017-02-08 DIAGNOSIS — F22 Delusional disorders: Secondary | ICD-10-CM | POA: Diagnosis not present

## 2017-02-08 LAB — RAPID URINE DRUG SCREEN, HOSP PERFORMED
AMPHETAMINES: NOT DETECTED
BARBITURATES: NOT DETECTED
BENZODIAZEPINES: POSITIVE — AB
Cocaine: NOT DETECTED
Opiates: NOT DETECTED
TETRAHYDROCANNABINOL: POSITIVE — AB

## 2017-02-08 MED ORDER — ACETAMINOPHEN 325 MG PO TABS
650.0000 mg | ORAL_TABLET | Freq: Four times a day (QID) | ORAL | Status: DC | PRN
  Administered 2017-02-11: 650 mg via ORAL
  Filled 2017-02-08: qty 2

## 2017-02-08 MED ORDER — LORAZEPAM 1 MG PO TABS
1.0000 mg | ORAL_TABLET | Freq: Three times a day (TID) | ORAL | Status: DC | PRN
  Administered 2017-02-08 – 2017-02-14 (×10): 1 mg via ORAL
  Filled 2017-02-08 (×11): qty 1

## 2017-02-08 MED ORDER — ZIPRASIDONE MESYLATE 20 MG IM SOLR
20.0000 mg | Freq: Two times a day (BID) | INTRAMUSCULAR | Status: DC | PRN
  Administered 2017-02-09: 20 mg via INTRAMUSCULAR
  Filled 2017-02-08: qty 20

## 2017-02-08 MED ORDER — RISPERIDONE 2 MG PO TABS
4.0000 mg | ORAL_TABLET | Freq: Two times a day (BID) | ORAL | Status: DC
  Filled 2017-02-08 (×4): qty 2

## 2017-02-08 MED ORDER — DIPHENHYDRAMINE HCL 25 MG PO CAPS
ORAL_CAPSULE | ORAL | Status: AC
  Administered 2017-02-08: 50 mg via ORAL
  Filled 2017-02-08: qty 2

## 2017-02-08 MED ORDER — BENZTROPINE MESYLATE 1 MG PO TABS
1.0000 mg | ORAL_TABLET | Freq: Every day | ORAL | Status: DC
  Administered 2017-02-08 – 2017-02-14 (×7): 1 mg via ORAL
  Filled 2017-02-08 (×9): qty 1

## 2017-02-08 MED ORDER — QUETIAPINE FUMARATE 100 MG PO TABS
100.0000 mg | ORAL_TABLET | Freq: Every evening | ORAL | Status: DC | PRN
  Administered 2017-02-08 (×2): 100 mg via ORAL
  Filled 2017-02-08 (×5): qty 1

## 2017-02-08 MED ORDER — LORAZEPAM 2 MG/ML IJ SOLN: 2.0000 mg | Freq: Four times a day (QID) | INTRAMUSCULAR | Status: DC | PRN

## 2017-02-08 MED ORDER — IBUPROFEN 200 MG PO TABS
600.0000 mg | ORAL_TABLET | Freq: Once | ORAL | Status: AC
  Administered 2017-02-08: 600 mg via ORAL
  Filled 2017-02-08: qty 3

## 2017-02-08 MED ORDER — MAGNESIUM HYDROXIDE 400 MG/5ML PO SUSP: 30.0000 mL | Freq: Every day | ORAL | Status: DC | PRN

## 2017-02-08 MED ORDER — DIPHENHYDRAMINE HCL 50 MG PO CAPS
50.0000 mg | ORAL_CAPSULE | Freq: Once | ORAL | Status: AC
  Administered 2017-02-08: 50 mg via ORAL
  Filled 2017-02-08: qty 1

## 2017-02-08 MED ORDER — STERILE WATER FOR INJECTION IJ SOLN
INTRAMUSCULAR | Status: AC
  Administered 2017-02-08: 1.2 mL
  Filled 2017-02-08: qty 10

## 2017-02-08 MED ORDER — DIPHENHYDRAMINE HCL 25 MG PO CAPS
50.0000 mg | ORAL_CAPSULE | Freq: Once | ORAL | Status: AC | PRN
  Administered 2017-02-08: 50 mg via ORAL
  Filled 2017-02-08: qty 2

## 2017-02-08 MED ORDER — OLANZAPINE 10 MG PO TBDP
10.0000 mg | ORAL_TABLET | Freq: Three times a day (TID) | ORAL | Status: DC | PRN
  Administered 2017-02-08 – 2017-02-09 (×2): 10 mg via ORAL
  Filled 2017-02-08 (×3): qty 1

## 2017-02-08 MED ORDER — CHLORPROMAZINE HCL 25 MG/ML IJ SOLN: 50.0000 mg | Freq: Two times a day (BID) | INTRAMUSCULAR | Status: DC | PRN

## 2017-02-08 MED ORDER — ZIPRASIDONE MESYLATE 20 MG IM SOLR
20.0000 mg | Freq: Once | INTRAMUSCULAR | Status: AC
  Administered 2017-02-08: 20 mg via INTRAMUSCULAR
  Filled 2017-02-08: qty 20

## 2017-02-08 MED ORDER — ONDANSETRON HCL 4 MG PO TABS: 4.0000 mg | ORAL_TABLET | Freq: Three times a day (TID) | ORAL | Status: DC | PRN

## 2017-02-08 MED ORDER — IBUPROFEN 200 MG PO TABS: 600.0000 mg | ORAL_TABLET | Freq: Four times a day (QID) | ORAL | Status: DC | PRN

## 2017-02-08 MED ORDER — LORAZEPAM 1 MG PO TABS: 1.0000 mg | ORAL_TABLET | Freq: Two times a day (BID) | ORAL | Status: DC

## 2017-02-08 MED ORDER — TRAZODONE HCL 100 MG PO TABS
100.0000 mg | ORAL_TABLET | Freq: Every day | ORAL | Status: DC
  Administered 2017-02-09 – 2017-02-14 (×6): 100 mg via ORAL
  Filled 2017-02-08 (×9): qty 1

## 2017-02-08 MED ORDER — LORAZEPAM 1 MG PO TABS
1.0000 mg | ORAL_TABLET | Freq: Once | ORAL | Status: AC
  Administered 2017-02-08: 1 mg via ORAL

## 2017-02-08 MED ORDER — NICOTINE 21 MG/24HR TD PT24
21.0000 mg | MEDICATED_PATCH | Freq: Every day | TRANSDERMAL | Status: DC
  Filled 2017-02-08 (×5): qty 1

## 2017-02-08 MED ORDER — ALUM & MAG HYDROXIDE-SIMETH 200-200-20 MG/5ML PO SUSP
30.0000 mL | ORAL | Status: DC | PRN
  Administered 2017-02-09: 30 mL via ORAL
  Filled 2017-02-08: qty 30

## 2017-02-08 MED ORDER — LITHIUM CARBONATE ER 450 MG PO TBCR
450.0000 mg | EXTENDED_RELEASE_TABLET | Freq: Two times a day (BID) | ORAL | Status: DC
  Administered 2017-02-08 – 2017-02-15 (×14): 450 mg via ORAL
  Filled 2017-02-08 (×21): qty 1

## 2017-02-08 MED ORDER — QUETIAPINE FUMARATE 100 MG PO TABS
ORAL_TABLET | ORAL | Status: AC
  Filled 2017-02-08: qty 1

## 2017-02-08 MED ORDER — DIPHENHYDRAMINE HCL 25 MG PO CAPS: 50.0000 mg | ORAL_CAPSULE | Freq: Four times a day (QID) | ORAL | Status: DC | PRN

## 2017-02-08 MED ORDER — LORAZEPAM 1 MG PO TABS: 2.0000 mg | ORAL_TABLET | Freq: Four times a day (QID) | ORAL | Status: DC | PRN

## 2017-02-08 MED ORDER — DIPHENHYDRAMINE HCL 50 MG/ML IJ SOLN: 50.0000 mg | Freq: Four times a day (QID) | INTRAMUSCULAR | Status: DC | PRN
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
## 2017-02-08 NOTE — ED Notes (Addendum)
Up to the desk wanting her clothes and demanding that  the police officer to take her home.  Pt is aware that she is going to be admitted.  Pt loud, cursing at staff at times,  Difficult to redirect at times.

## 2017-02-08 NOTE — ED Notes (Signed)
[facility] and pt will be able to transport around lunch

## 2017-02-08 NOTE — Progress Notes (Addendum)
Patient stated she could not take risperdal, that she took risperdal approximately 5 months ago and had mouth spasms.  Patient denied SI and HI, contracts for safety.  Denied A/V hallucinations.  Patient denied pain.  Respirations even and unlabored.  No signs/symptoms of pain/distress noted on patient's face/body movements.  Safety maintained with 15 minute checks.  Patient signed voluntary form on 02/08/2017 at 1600.

## 2017-02-08 NOTE — ED Notes (Signed)
Pt reported that she had a HA, when told she would be given medication she denied that she had a HA.  Pt is [facility]

## 2017-02-08 NOTE — ED Notes (Signed)
Up on the phone 

## 2017-02-08 NOTE — ED Notes (Addendum)
Up to the desk asking  Security/staff to take her home, talking loudly at times.   Pt is aware that she is going to be admitted.

## 2017-02-08 NOTE — ED Notes (Addendum)
Pt refused Risperdal reporting that she is allergic to it.  Dr Nelva BushNorma aware.

## 2017-02-08 NOTE — Progress Notes (Signed)
[name] is a 41 year old female being admitted involuntarily to [facility] from [facility].  She came to the ED under IVC for mania and threatening family members.   She was a poor historian and agitated while in the ED.  She denied SI/HI or A/V hallucinations.  [facility] she denies SI/HI or A/V hallucinations.  SHe was agitated and highly irritable.  She denies any pain or discomfort and appears to be in no physical distress.  She was argumentative and talking negatively towards staff.  Oriented her to the unit.  Admission paperwork completed and signed.  Belongings searched and secured in locker # 35.  Skin assessment completed and no skin issues noted.  Q 15 minute checks initiated for safety.  We will monitor the progress towards her goals.

## 2017-02-08 NOTE — Tx Team (Signed)
Initial Treatment Plan 02/08/2017 1:32 PM Barbara SenterChanda D [place] ZOX:[id number]    PATIENT STRESSORS: Medication change or noncompliance Other: Chronic mental illness   PATIENT STRENGTHS: Wellsite geologistCommunication skills General fund of knowledge Physical Health   PATIENT IDENTIFIED PROBLEMS: Agitation/mania  Psychosis  "Help me with minding my business"  "Being a better person"               DISCHARGE CRITERIA:  Improved stabilization in mood, thinking, and/or behavior Need for constant or close observation no longer present Verbal commitment to aftercare and medication compliance  PRELIMINARY DISCHARGE PLAN: Outpatient therapy Medication management  PATIENT/FAMILY INVOLVEMENT: This treatment plan has been presented to and reviewed with the patient, [name].  The patient and family have been given the opportunity to ask questions and make suggestions.  Levin BaconHeather V Brinson Tozzi, RN 02/08/2017, 1:32 PM

## 2017-02-08 NOTE — ED Notes (Addendum)
[facility]o difficulty with [facility], belongings given to police officers.
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
## 2017-02-08 NOTE — Progress Notes (Addendum)
Nursing Progress Note 1900-0730  D) Patient presents with manic behavior, hyperactivity and is intrusive on the unit. Patient is verbally assaultive to staff and peers requiring constant redirection. Patient has taken snacks from her peers, attempts to join their conversations at the medication window and states, "people don't like me because I tell it like it is". Patient appears to have religious delusions stating, "my nephew is the spawn of [place]. All the children in my family are demons". Patient attempts to call her mother several times this evening. Patient states, "dumb bitch needs to get off that dick and answer the phone". Patient asks MHT this evening "do you have a girlfriend?" Patient tells her peers, "all you black folk are pimps and drug dealers". Patient has rapid, tangential speech and flight of ideas. Patient sleeping at start of shift and did not attend group but did get up for snacks. Patient denies SI/HI/AVH or pain. Patient contracts for safety on the unit. Patient compliant with medications. Patient is focused on discharge and repeatedly states, "my mama can't keep me here. I am going to call the [facility] workers from [facility], she'll get me out of here". Patient reports, "[facility]. I feel bad for anyone who can't get bailed out of prison". Patient demanded writer cut off her High Fall Risk bracelet and refused education to wear it.  A) Emotional support given. 1:1 interaction and active listening provided. Patient medicated as prescribed and with PRN orders. Snacks and fluids provided. Opportunities for questions or concerns presented to patient. Patient encouraged to continue to work on treatment goals. Labs, vital signs and patient behavior monitored throughout shift. Patient safety maintained with q15 min safety checks. High fall risk precautions in place and reviewed with patient; patient verbalized understanding.  R) Patient  remains safe on the unit at this time. Patient denies any adverse medication reactions at this time. Patient is resting in bed without complaints. Will continue to monitor.

## 2017-02-08 NOTE — Progress Notes (Signed)
Patient ID: Barbara SenterChanda D [name], female   DOB: February 15, 1976, 41 y.o.   MRN: [id number] PER STATE REGULATIONS 482.30  THIS CHART WAS REVIEWED FOR MEDICAL NECESSITY WITH RESPECT TO THE PATIENT'S ADMISSION/DURATION OF STAY.  NEXT REVIEW DATE: 02/12/17 Loura HaltBARBARA Stayce Delancy, RN, BSN CASE MANAGER

## 2017-02-08 NOTE — Progress Notes (Signed)
Patient has approached nurse's station several times in the last hour. Patient continues to have loud, tangential and pressured speech. Patient continues with delusions stating to MHT, "you're just trying to take Stephanie's job". Patient invades International aid/development workerpersonal space of writer and MHT. Patient requiring frequent verbal redirection to which patient minimally responds to. Patient medicated with PRN orders with no decrease in behaviors. PA Spencer notified and new orders received. Patient medicated as prescribed. Writer directed patient back into bed. Patient appears to be responding to internal stimuli and at times talking to someone who isn't there.

## 2017-02-08 NOTE — ED Notes (Signed)
[facility]

## 2017-02-08 NOTE — ED Notes (Signed)
Up to the bathroom 

## 2017-02-08 NOTE — ED Notes (Signed)
[facility] contacted for transport 

## 2017-02-08 NOTE — ED Notes (Signed)
[employer] is here to transport 

## 2017-02-08 NOTE — B[facility] Counselor (Signed)
[facility] Jacksonori[facility]    Redmond Pullingreylese D Kinda Pottle, MS, [facility], [facility] Triage Specialist [phone]

## 2017-02-08 NOTE — Progress Notes (Signed)
Lab, [facility] Hesselbach[facility], called and said lab worker [facility].  Lab will put in order for this lab work to be done in the morning, TSH and FT4.  Charge nurse informed.

## 2017-02-08 NOTE — Progress Notes (Signed)
Patient resting in bed with eyes closed.  Respirations even and unlabored.  No signs/symptoms of pain/distress noted on patient's face/body movements.  Safety maintained with 15 minute checks.
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
## 2017-02-09 LAB — T4, FREE: FREE T4: 1.15 ng/dL — AB (ref 0.61–1.12)

## 2017-02-09 LAB — TSH: TSH: 1.407 u[IU]/mL (ref 0.350–4.500)

## 2017-02-09 LAB — LITHIUM LEVEL: Lithium Lvl: 0.22 mmol/L — ABNORMAL LOW (ref 0.60–1.20)

## 2017-02-09 MED ORDER — LORAZEPAM 1 MG PO TABS
2.0000 mg | ORAL_TABLET | Freq: Once | ORAL | Status: AC
  Administered 2017-02-09: 2 mg via ORAL
  Filled 2017-02-09: qty 2

## 2017-02-09 MED ORDER — ASENAPINE MALEATE 5 MG SL SUBL
5.0000 mg | SUBLINGUAL_TABLET | Freq: Two times a day (BID) | SUBLINGUAL | Status: DC
  Administered 2017-02-09 – 2017-02-15 (×13): 5 mg via SUBLINGUAL
  Filled 2017-02-09 (×19): qty 1

## 2017-02-09 NOTE — Progress Notes (Signed)
1:1 Nursing Progress Note  D: Patient remains hyper-verbal and is talking loudly in her room with rapid, tangential speech. Environment is secured. Sitter observed with patient per [facility] policy.  A: Patient remains on 1:1 observation per provider orders. High fall risk precautions in place. Patient safety monitored with q15 minute safety checks. Snacks and fluids provided.  R: Patient remains safe on the unit at this time. Will continue to monitor with 1:1 sitter, q15 min safety checks & q4 hour nursing assessments.

## 2017-02-09 NOTE — Progress Notes (Signed)
1:1 Nursing Progress Note  D: Patient has approached the nurses's station several times in the last two hours. Patient has been redirected back to bed due to dayroom closing at 2300. Patient reports to writer, "I can't sleep, I wanna kill my mama". Patient continues with rapid, tangential speech. Per NP, patient provided PRN zyprexa 10 mg. Environment is secured. Sitter observed with patient per [facility] policy. Patient continues to staff split and is irritable with any black staff member.  A: Patient remains on 1:1 observation per provider orders. High fall risk precautions in place. Patient safety monitored with q15 minute safety checks. Medications administered as ordered. Fluids provided. Support and redirection provided.  R: Patient remains safe on the unit at this time. Will continue to monitor with 1:1 sitter, q15 min safety checks & q4 hour nursing assessments.

## 2017-02-09 NOTE — Progress Notes (Signed)
Patient continues to have agitated behavior and is verbally aggressive towards staff. Patient verbally agrees to take PO medications. NP Barbara CowerJason notified and new orders received. Patient medicated with 2 mg of ativan as ordered. Will continue to support and monitor.

## 2017-02-09 NOTE — Progress Notes (Addendum)
Recreation Therapy Notes  Date: 02/09/17 Time: 1000 Location: 500 Hall Dayroom  Group Topic: Self-Esteem  Goal Area(s) Addresses:  Patient will successfully identify positive attributes about themselves.  Patient will successfully identify benefit of improved self-esteem.   Behavioral Response: Minimal  Intervention: Construction paper, markers  Activity: Lawyerelf Advertisement.  Patients were to create an advertisement that describes and highlights the positive traits and qualities about themselves.  Education: Self-Esteem, Building control surveyorDischarge Planning.   Education Outcome: Acknowledges education/In group clarification offered/Needs additional education  Clinical Observations/Feedback: Pt stated self esteem was "how you feel about yourself".  Pt attempted to complete activity.  Pt was unable to focus and concentrate on activity.      Caroll RancherMarjette Beckley, LRT/CTRS      Caroll RancherLindsay, Haelee Bolen A 02/09/2017 12:01 PM

## 2017-02-09 NOTE — Progress Notes (Addendum)
Recreation Therapy Notes  INPATIENT RECREATION THERAPY ASSESSMENT  Patient Details Name: [name] MRN: [id number] DOB: June 24, 1975 Today's Date: 02/09/2017  Patient Stressors: Family  Pt stated her mom was her stressor. Pt stated she was here because of her mom.  Coping Skills:   Isolate, Exercise, Music, Other (Comment)  Pt stated she likes to read and meditate.  Personal Challenges: Self-Esteem/Confidence, Trusting Others  Leisure Interests (2+):  Individual - TV, Individual - Other (Comment)(Cook)  Awareness of Community Resources:  Yes  Community Resources:  Park  Current Use: Yes  Patient Strengths:  Not letting emotions get to me; sing  Patient Identified Areas of Improvement:  Be more considerate and compassionate  Current Recreation Participation:  Not ever  Patient Goal for Hospitalization:  "To get out of here and treat people how they want me to treat them"  [place]  [place]  Current [place] (including self-harm):  No  Current HI:  No  Consent to Intern Participation: N/A    Caroll RancherMarjette Henricksen, LRT/CTRS  Caroll RancherLindsay, Kinsler Soeder A 02/09/2017, 2:34 PM

## 2017-02-09 NOTE — Progress Notes (Signed)
1:1 Nursing Progress Note  D: Patient continues to present with manic behavior, hyperactivity and is intrusive on the unit. Patient is verbally assaultive to staff and peers requiring constant redirection. Patient continues to state, "I hate black people" and talk about her mother "not answering the damn phone". Patient appears paranoid and IT sales professionalaccuses sitter of "talking to me like I'm stupid". Patient provided with PRN ativan 1 mg due to agitated behavior and aggressive speech. Patient also provided nighttime medications early. Patient did not attend group this evening. Patient denies SI/HI/AVH or pain and reports "I don't need to be here". Environment is secured. Sitter observed with patient per [facility] policy.  A: Patient remains on 1:1 observation per provider orders. High fall risk precautions in place. Patient safety monitored with q15 minute safety checks. Patient medicated as prescribed. Snacks and fluids provided. Patient emotionally supported and redirected as needed.  R: Patient remains safe on the unit at this time. Will continue to monitor with 1:1 sitter, q15 min safety checks & q4 hour nursing assessments.

## 2017-02-09 NOTE — Progress Notes (Signed)
Did not attend group 

## 2017-02-09 NOTE — Progress Notes (Signed)
1:1 Nursing Progress Note  D: Sitter reports patient is sleeping intermittenly but occassionally wakes up and attempts to get ready for the day. Sitter reports redirecting patient back to sleep. Environment is secured. Sitter observed with patient per [facility] policy. Patient currently sleeping; respirations easy and unlabored. Patient observed snoring.  A: Patient remains on 1:1 observation per provider orders. High fall risk precautions in place. Patient safety monitored with q15 minute safety checks.  R: Patient remains safe on the unit at this time. Will continue to monitor with 1:1 sitter, q15 min safety checks & q4 hour nursing assessments.

## 2017-02-09 NOTE — Progress Notes (Signed)
[facility] MD Progress Note  02/09/2017 11:19 AM [name]  MRN:  [id number] Subjective:    [name] is a 41 y/o F with history of schizoaffective disorder bipolar type who was admitted on IVC placed by her mother with concern for worsening symptoms of mania and threats of violence to her family. Pt presented initially as irritable and agitated upon evaluation in ED, and she continued to be irritable once admitted to [facility]. She was initially agreeable to be restarted on lithium. Overnight, pt had intrusive behaviors of entering other patients' rooms and taking others' food, as well as shouting at times, so she was placed on 1:1 with staff.  Today upon evaluation, pt is irritable and short with her responses, and she has a mocking tone to this provider at times. She curses often and she has poor insight about her behaviors being intrusive. Pt attempted to intimidate and threaten this provider by stepping in front of my path in the hallway, posturing, and stating, "What about you, doc?" Pt was able to be redirected by staff. She denies SI/HI/AH/VH. Discussed with patient that she has some ongoing symptoms of irritability which may benefit from additional medication, and she was in agreement to trial of saphris in addition to her current medication regimen.    Principal Problem: Schizoaffective disorder, [name] Diagnosis:   Patient Active Problem List   Diagnosis Date Noted  . Schizoaffective disorder, [name] [F25.0] 02/08/2017  . Adjustment disorder with disturbance of emotion [F43.29] 12/13/2016  . Agitation [R45.1]   . Mood disorder (HCC) [F39]   . Affective psychosis, bipolar (HCC) [F31.9]   . Bipolar affective disorder, currently manic, severe, with psychotic features (HCC) [F31.2] 09/24/2014  . Homicidal ideation [R45.850]   . Bipolar disorder, current episode manic w/o psychotic features, severe (HCC) [F31.13] 08/21/2014  . Cannabis use disorder, moderate,  dependence (HCC) [F12.20] 08/21/2014  . Tobacco use disorder [F17.200] 08/21/2014   Total Time spent with patient: 30 minutes  Past Psychiatric History: see H&P  Past Medical History:  Past Medical History:  Diagnosis Date  . Anxiety   . Bipolar affective (HCC)   . OCD (obsessive compulsive disorder)    History reviewed. No pertinent surgical history. Family History:  Family History  Problem Relation Age of Onset  . Drug abuse Father   . Mental illness Other    Family Psychiatric  History: see H&P Social History:  Social History   Substance and Sexual Activity  Alcohol Use Yes   Comment: socially     Social History   Substance and Sexual Activity  Drug Use Yes  . Types: Marijuana   Comment: occasionally    Social History   Socioeconomic History  . Marital status: Single    Spouse name: None  . Number of children: None  . Years of education: None  . Highest education level: None  Social Needs  . Financial resource strain: None  . Food insecurity - worry: None  . Food insecurity - inability: None  . Transportation needs - medical: None  . Transportation needs - non-medical: None  Occupational History  . None  Tobacco Use  . Smoking status: Current Every Day Smoker    Packs/day: 1.00    Years: 1.00    Pack years: 1.00    Types: Cigarettes  . Smokeless tobacco: Never Used  Substance and Sexual Activity  . Alcohol use: Yes    Comment: socially  . Drug use: Yes    Types:  Marijuana    Comment: occasionally  . Sexual activity: No    Birth control/protection: None  Other Topics Concern  . None  Social History Narrative  . None   Additional Social History:                         Sleep: Fair  Appetite:  Fair  Current Medications: Current Facility-Administered Medications  Medication Dose Route Frequency Provider Last Rate Last Dose  . acetaminophen (TYLENOL) tablet 650 mg  650 mg Oral Q6H PRN Laveda AbbeParks, Laurie Britton, NP      . alum & mag  hydroxide-simeth (MAALOX/MYLANTA) 200-200-20 MG/5ML suspension 30 mL  30 mL Oral Q4H PRN Laveda AbbeParks, Laurie Britton, NP   30 mL at 02/09/17 0753  . asenapine (SAPHRIS) sublingual tablet 5 mg  5 mg Sublingual BID Jolyne Loaainville, Blaklee Shores T, MD      . benztropine (COGENTIN) tablet 1 mg  1 mg Oral QHS Laveda AbbeParks, Laurie Britton, NP   1 mg at 02/08/17 2029  . lithium carbonate (ESKALITH) CR tablet 450 mg  450 mg Oral Q12H Micheal Likensainville, Aedin Jeansonne T, MD   450 mg at 02/09/17 0749  . LORazepam (ATIVAN) tablet 1 mg  1 mg Oral Q8H PRN Micheal Likensainville, Kyrel Leighton T, MD   1 mg at 02/09/17 0751  . magnesium hydroxide (MILK OF MAGNESIA) suspension 30 mL  30 mL Oral Daily PRN Laveda AbbeParks, Laurie Britton, NP      . nicotine (NICODERM CQ - dosed in mg/24 hours) patch 21 mg  21 mg Transdermal Daily Laveda AbbeParks, Laurie Britton, NP      . OLANZapine zydis (ZYPREXA) disintegrating tablet 10 mg  10 mg Oral Q8H PRN Micheal Likensainville, Robbi Scurlock T, MD   10 mg at 02/08/17 2320   Or  . ziprasidone (GEODON) injection 20 mg  20 mg Intramuscular Q12H PRN Micheal Likensainville, Saud Bail T, MD      . ondansetron [facility](ZOFRAN) tablet 4 mg  4 mg Oral Q8H PRN Laveda AbbeParks, Laurie Britton, NP      . traZODone (DESYREL) tablet 100 mg  100 mg Oral QHS Laveda AbbeParks, Laurie Britton, NP        Lab Results:  Results for orders placed or performed during the hospital encounter of 02/06/17 (from the past 48 hour(s))  TSH     Status: None   Collection Time: 02/07/17  3:30 PM  Result Value Ref Range   TSH 1.464 0.350 - 4.500 uIU/mL    Comment: Performed by a 3rd Generation assay with a functional sensitivity of <=0.01 uIU/mL.  T4, free     Status: None   Collection Time: 02/07/17  3:30 PM  Result Value Ref Range   Free T4 1.08 0.61 - 1.12 ng/dL    Comment: (NOTE) Biotin ingestion may interfere with free T4 tests. If the results are inconsistent with the TSH level, previous test results, or the clinical presentation, then consider biotin interference. If needed, order repeat testing after  stopping biotin. Performed at [facility], [address] [place], [place] [address]   Rapid urine drug screen (hospital performed)     Status: Abnormal   Collection Time: 02/08/17  9:10 AM  Result Value Ref Range   Opiates NONE DETECTED NONE DETECTED   Cocaine NONE DETECTED NONE DETECTED   Benzodiazepines POSITIVE (A) NONE DETECTED   Amphetamines NONE DETECTED NONE DETECTED   Tetrahydrocannabinol POSITIVE (A) NONE DETECTED   Barbiturates NONE DETECTED NONE DETECTED    Comment:  DRUG SCREEN FOR MEDICAL PURPOSES ONLY.  IF CONFIRMATION IS NEEDED FOR ANY PURPOSE, NOTIFY LAB WITHIN 5 DAYS.        LOWEST DETECTABLE LIMITS FOR URINE DRUG SCREEN Drug Class       Cutoff (ng/mL) Amphetamine      1000 Barbiturate      200 Benzodiazepine   200 Tricyclics       300 Opiates          300 Cocaine          300 THC              50     Blood Alcohol level:  Lab Results  Component Value Date   ETH <10 02/06/2017   ETH <5 02/18/2016    Metabolic Disorder Labs: Lab Results  Component Value Date   HGBA1C 6.5 (H) 09/30/2014   MPG 140 09/30/2014   MPG 134 (H) 12/26/2012   Lab Results  Component Value Date   PROLACTIN 150.0 (H) 08/22/2014   Lab Results  Component Value Date   CHOL 137 09/30/2014   TRIG 98 09/30/2014   HDL 37 (L) 09/30/2014   CHOLHDL 3.7 09/30/2014   VLDL 20 09/30/2014   LDLCALC 80 09/30/2014    Physical Findings: AIMS: Facial and Oral Movements Muscles of Facial Expression: None, normal Lips and Perioral Area: None, normal Jaw: None, normal Tongue: None, normal,Extremity Movements Upper (arms, wrists, hands, fingers): None, normal Lower (legs, knees, ankles, toes): None, normal, Trunk Movements Neck, shoulders, hips: None, normal, Overall Severity Severity of abnormal movements (highest score from questions above): None, normal Incapacitation due to abnormal movements: None, normal Patient's awareness of abnormal movements (rate only  patient's report): No Awareness, Dental Status Current problems with teeth and/or dentures?: No Does patient usually wear dentures?: No  CIWA:    COWS:     Musculoskeletal: Strength & Muscle Tone: within normal limits Gait & Station: normal Patient leans: N/A  Psychiatric Specialty Exam: Physical Exam  Nursing note and vitals reviewed.   Review of Systems  Constitutional: Negative for chills and fever.  Respiratory: Negative for cough.   Gastrointestinal: Negative for heartburn and nausea.  Psychiatric/Behavioral: Negative for depression, hallucinations and suicidal ideas.    Blood pressure (!) 115/94, pulse (!) 114, temperature 97.8 F (36.6 C), temperature source Oral, resp. rate 18, height 5\' 3"  (1.6 m), weight 79.8 kg (176 lb), SpO2 100 %.Body mass index is 31.18 kg/m.  General Appearance: Casual and Fairly Groomed  Eye Contact:  Good  Speech:  Clear and Coherent and Pressured  Volume:  Increased  Mood:  Angry, Anxious, Dysphoric and Irritable  Affect:  Congruent and Labile  Thought Process:  Coherent and Goal Directed  Orientation:  Full (Time, Place, and Person)  Thought Content:  Logical and Paranoid Ideation  Suicidal Thoughts:  No  Homicidal Thoughts:  No  Memory:  Immediate;   Good Recent;   Good Remote;   Good  Judgement:  Fair  Insight:  Fair  Psychomotor Activity:  Normal  Concentration:  Concentration: Poor  Recall:  Good  Fund of Knowledge:  Poor  Language:  Poor  Akathisia:  No  Handed:    AIMS (if indicated):     Assets:  Communication Skills Leisure Time Resilience Social Support  ADL's:  Intact  Cognition:  WNL  Sleep:  Number of Hours: 1.75     Treatment Plan Summary: Daily contact with patient to assess and evaluate symptoms and progress in treatment and Medication  management. Pt has been having episodes of agitation and irritability since admission, requiring PRN medications to manage her behaviors and 1:1 staff observation. She will be  started on saphris in addition to her current regimen.  - Continue inpatient level of care  - Labs:  lithium level  - Schizoaffective bipolar type             - Continue lithium 450mg  BID (verify outpatient dose with ACT team)             - Start saphris 5mg  BID - Insomnia             - Continue trazodone 50mg  qhs prn insomnia - Anxiety             - Continue ativan 1mg  q8h prn anxiety  -Encourage participation in groups and therapeutic milieu -Discharge planning will be ongoing    Micheal Likens, MD 02/09/2017, 11:19 AM

## 2017-02-09 NOTE — Progress Notes (Addendum)
Patient can currently be heard yelling in her room at nurse's station. Patient has done this several times throughout the night. MHT and writer have responded. Patient continues to have unpredictable outbursts consisting of delusions. Patient frequently states, "I need a rich man. Nothing but broke men out there". Sitter verbally redirecting patient back to sleep. Patient remains safe in her room. Will continue to monitor.

## 2017-02-09 NOTE — Progress Notes (Signed)
Patient slept 1.5 hours last night per review of q15 minute check sheet and MHT.

## 2017-02-09 NOTE — Tx Team (Signed)
Interdisciplinary Treatment and Diagnostic Plan Update  02/09/2017 Time of Session: 8:33 AM  [name] MRN: [id number]  Principal Diagnosis: Schizoaffective disorder, bipolar type ([place])  Secondary Diagnoses: Principal Problem:   Schizoaffective disorder, bipolar type ([name])   Current Medications:  Current Facility-Administered Medications  Medication Dose Route Frequency Provider Last Rate Last Dose  . acetaminophen (TYLENOL) tablet 650 mg  650 mg Oral Q6H PRN Ethelene Hal, NP      . alum & mag hydroxide-simeth (MAALOX/MYLANTA) 200-200-20 MG/5ML suspension 30 mL  30 mL Oral Q4H PRN Ethelene Hal, NP   30 mL at 02/09/17 0753  . benztropine (COGENTIN) tablet 1 mg  1 mg Oral QHS Ethelene Hal, NP   1 mg at 02/08/17 2029  . lithium carbonate (ESKALITH) CR tablet 450 mg  450 mg Oral Q12H Pennelope Bracken, MD   450 mg at 02/09/17 0749  . LORazepam (ATIVAN) tablet 1 mg  1 mg Oral Q8H PRN Pennelope Bracken, MD   1 mg at 02/09/17 0751  . magnesium hydroxide (MILK OF MAGNESIA) suspension 30 mL  30 mL Oral Daily PRN Ethelene Hal, NP      . nicotine (NICODERM CQ - dosed in mg/24 hours) patch 21 mg  21 mg Transdermal Daily Ethelene Hal, NP      . OLANZapine zydis (ZYPREXA) disintegrating tablet 10 mg  10 mg Oral Q8H PRN Pennelope Bracken, MD   10 mg at 02/08/17 2320   Or  . ziprasidone (GEODON) injection 20 mg  20 mg Intramuscular Q12H PRN Pennelope Bracken, MD      . ondansetron [facility]) tablet 4 mg  4 mg Oral Q8H PRN Ethelene Hal, NP      . QUEtiapine (SEROQUEL) tablet 100 mg  100 mg Oral QHS,MR X 1 [name], PA-C   100 mg at 02/08/17 2140  . traZODone (DESYREL) tablet 100 mg  100 mg Oral QHS Ethelene Hal, NP        PTA Medications: Medications Prior to Admission  Medication Sig Dispense Refill Last Dose  . benztropine (COGENTIN) 1 MG tablet Take 1 mg by mouth at bedtime.   Past Month at  Unknown time  . lithium carbonate 300 MG capsule Take 300 mg by mouth 2 (two) times daily.   Past Month at Unknown time  . LORazepam (ATIVAN) 1 MG tablet Take 1 mg by mouth 2 (two) times daily.   Past Month at Unknown time  . risperidone (RISPERDAL) 4 MG tablet Take 4 mg by mouth 2 (two) times daily.   Past Month at Unknown time  . traZODone (DESYREL) 100 MG tablet Take 100 mg by mouth at bedtime.   Past Month at Unknown time    Patient Stressors: Medication change or noncompliance Other: Chronic mental illness  Patient Strengths: Curator fund of knowledge Physical Health  Treatment Modalities: Medication Management, Group therapy, Case management,  1 to 1 session with clinician, Psychoeducation, Recreational therapy.   Physician Treatment Plan for Primary Diagnosis: Schizoaffective disorder, bipolar type ([place]) Long Term Goal(s): Improvement in symptoms so as ready for discharge  Short Term Goals: Ability to identify changes in lifestyle to reduce recurrence of condition will improve Ability to identify and develop effective coping behaviors will improve  Medication Management: Evaluate patient's response, side effects, and tolerance of medication regimen.  Therapeutic Interventions: 1 to 1 sessions, Unit Group sessions and Medication administration.  Evaluation of Outcomes: Progressing  Physician  Treatment Plan for Secondary Diagnosis: Principal Problem:   Schizoaffective disorder, bipolar type ([place])   Long Term Goal(s): Improvement in symptoms so as ready for discharge  Short Term Goals: Ability to identify changes in lifestyle to reduce recurrence of condition will improve Ability to identify and develop effective coping behaviors will improve  Medication Management: Evaluate patient's response, side effects, and tolerance of medication regimen.  Therapeutic Interventions: 1 to 1 sessions, Unit Group sessions and Medication  administration.  Evaluation of Outcomes: Progressing   RN Treatment Plan for Primary Diagnosis: Schizoaffective disorder, bipolar type ([place]) Long Term Goal(s): Knowledge of disease and therapeutic regimen to maintain health will improve  Short Term Goals: Ability to identify and develop effective coping behaviors will improve and Compliance with prescribed medications will improve  Medication Management: RN will administer medications as ordered by provider, will assess and evaluate patient's response and provide education to patient for prescribed medication. RN will report any adverse and/or side effects to prescribing provider.  Therapeutic Interventions: 1 on 1 counseling sessions, Psychoeducation, Medication administration, Evaluate responses to treatment, Monitor vital signs and CBGs as ordered, Perform/monitor CIWA, COWS, AIMS and Fall Risk screenings as ordered, Perform wound care treatments as ordered.  Evaluation of Outcomes: Progressing   LCSW Treatment Plan for Primary Diagnosis: Schizoaffective disorder, bipolar type ([place]) Long Term Goal(s): Safe transition to appropriate next level of care at discharge, Engage patient in therapeutic group addressing interpersonal concerns.  Short Term Goals: Engage patient in aftercare planning with referrals and resources  Therapeutic Interventions: Assess for all discharge needs, 1 to 1 time with Social worker, Explore available resources and support systems, Assess for adequacy in community support network, Educate family and significant other(s) on suicide prevention, Complete Psychosocial Assessment, Interpersonal group therapy.  Evaluation of Outcomes: Met  Return home with mother, follow up [facility]   Progress in Treatment: Attending groups: Yes Participating in groups: Yes Taking medication as prescribed: Yes Toleration medication: Yes, no side effects reported at this time Family/Significant other contact  made: No Patient understands diagnosis: Limited insight Discussing patient identified problems/goals with staff: Yes Medical problems stabilized or resolved: Yes Denies suicidal/homicidal ideation: Yes Issues/concerns per patient self-inventory: None Other: N/A  New problem(s) identified: None identified at this time.   New Short Term/Long Term Goal(s): "Help me with minding my business"   Discharge Plan or Barriers:   Reason for Continuation of Hospitalization:  Mania  Medication stabilization   Estimated Length of Stay: 12/11  Attendees: Patient: [name] 02/09/2017  8:33 AM  Physician: Maris Berger, MD 02/09/2017  8:33 AM  Nursing: Sena Hitch, RN 02/09/2017  8:33 AM  RN Care Manager: Lars Pinks, RN 02/09/2017  8:33 AM  Social Worker: Ripley Fraise 02/09/2017  8:33 AM  Recreational Therapist: Winfield Cunas 02/09/2017  8:33 AM  Other: Norberto Sorenson 02/09/2017  8:33 AM  Other:  02/09/2017  8:33 AM    Scribe for Treatment Team:  Roque Lias LCSW 02/09/2017 8:33 AM

## 2017-02-09 NOTE — Progress Notes (Signed)
Patient continues to need constant redirection. Patient is yelling in hallway and not responsive to staff verbal redirection. AC, charge nurse and PA notified. Patient placed on 1:1 for her safety. Patient is resting in her room with sitter at bedside. Will continue to monitor.

## 2017-02-09 NOTE — Progress Notes (Signed)
1:1 Note Pt is walking on the hall way at this time cursing at staff. Pt took morning meds without any problems. Pt stated she did not sleep well last night. Pt also complained of anxiety, Ativan PO PRN given. Will continue to monitor.

## 2017-02-09 NOTE — Progress Notes (Signed)
1:1 Note Pt is the nurses station at this time, continue to use curse words. Pt given Geodon 20 mg IM at 1439 for being agitated, loud and picking fight with other patient. Pt has been very intrusive, not following direction and hard to redirect. Pt is all over the place. Will continue to monitor.

## 2017-02-09 NOTE — Progress Notes (Signed)
Patient asleep at this time. Due to manic behaviors and lack of sleep last night, patient not awakened for lab. Lab orders moved to PM draw at this time.

## 2017-02-10 MED ORDER — NICOTINE POLACRILEX 2 MG MT GUM: 2.0000 mg | CHEWING_GUM | OROMUCOSAL | Status: DC | PRN

## 2017-02-10 NOTE — Plan of Care (Signed)
Pt safe 

## 2017-02-10 NOTE — Progress Notes (Signed)
Patient approaches nurse's station and states, "my mama always lock me up in the psych ward and don't answer the phone. You ever heard of munchausen by proxy? I think she got that". Per sitter, patient's mother did answer the phone and patient spoke to her mother for a few minutes then said, "I don't wanna call you no more anyway" and hung up on her. Patient continues to state her mom has not answered the phone.

## 2017-02-10 NOTE — Progress Notes (Signed)
Patient maintained on 1:1 due to safety concerns.  Patient has not exhibited any intrusive or agitated behaviors..  Patient is calm and compliant with medications.  1:1 staff in close proximity to patient. Continue to monitor as planned.

## 2017-02-10 NOTE — Progress Notes (Signed)
[facility] MD Progress Note  02/10/2017 11:24 AM [name]  MRN:  [id number]   Subjective:    [name] is a 41 y/o F with history of schizoaffective disorder bipolar type who was admitted on IVC placed by her mother with concern for worsening symptoms of mania and threats of violence to her family. Pt presented initially as irritable and agitated upon evaluation in ED, and she continued to be irritable once [facility]. She agreed to be restarted on lithium and she also was accepting of trial of saphris. Pt had some ongoing agitation and intrusive behaviors which resulted in being placed on 1:1 with staff.   Today upon evaluation, pt continues to be irritable and labile. She expresses anger at her mother for "locking me up in here when she should be the one in here." She perseverates on her mother lying about her symptoms to have her admitted to the hospital, but pt acknowledges she has been having symptoms of "bipolar disorder." Pt denies SI/HI/AH/VH. She notes that she is tolerating her medications without difficulty or side effects. She is sleeping well and her appetite is good. Pt was observed talking to her mother on the phone and slamming the phone multiple times on the unit. Pt's discharge plan is to return to staying with her mother, and we discussed because there is still so much ongoing tension, that pt should plan to stay on the inpatient unit until her symptoms improve, and pt was in agreement.  Discussed with patient about her treatment plan. She agrees to continue her current regimen without changes at this time. Her lithium dose outside the hospital was confirmed as Lithium ER 300mg  BID and her level drawn on 02/09/17 was 0.22. She was restarted at higher dose of Lithium 450mg  BID and we will plan to recheck a lithium level on 02/13/17. She had no further questions, comments, or concerns.     Principal Problem: Schizoaffective disorder, [name] Diagnosis:   Patient  Active Problem List   Diagnosis Date Noted  . Schizoaffective disorder, [name] [F25.0] 02/08/2017  . Adjustment disorder with disturbance of emotion [F43.29] 12/13/2016  . Agitation [R45.1]   . Mood disorder (HCC) [F39]   . Affective psychosis, bipolar (HCC) [F31.9]   . Bipolar affective disorder, currently manic, severe, with psychotic features (HCC) [F31.2] 09/24/2014  . Homicidal ideation [R45.850]   . Bipolar disorder, current episode manic w/o psychotic features, severe (HCC) [F31.13] 08/21/2014  . Cannabis use disorder, moderate, dependence (HCC) [F12.20] 08/21/2014  . Tobacco use disorder [F17.200] 08/21/2014   Total Time spent with patient: 30 minutes  Past Psychiatric History: see H&P  Past Medical History:  Past Medical History:  Diagnosis Date  . Anxiety   . Bipolar affective (HCC)   . OCD (obsessive compulsive disorder)    History reviewed. No pertinent surgical history. Family History:  Family History  Problem Relation Age of Onset  . Drug abuse Father   . Mental illness Other    Family Psychiatric  History: see H&P Social History:  Social History   Substance and Sexual Activity  Alcohol Use Yes   Comment: socially     Social History   Substance and Sexual Activity  Drug Use Yes  . Types: Marijuana   Comment: occasionally    Social History   Socioeconomic History  . Marital status: Single    Spouse name: None  . Number of children: None  . Years of education: None  . Highest  education level: None  Social Needs  . Financial resource strain: None  . Food insecurity - worry: None  . Food insecurity - inability: None  . Transportation needs - medical: None  . Transportation needs - non-medical: None  Occupational History  . None  Tobacco Use  . Smoking status: Current Every Day Smoker    Packs/day: 1.00    Years: 1.00    Pack years: 1.00    Types: Cigarettes  . Smokeless tobacco: Never Used  Substance and Sexual Activity  .  Alcohol use: Yes    Comment: socially  . Drug use: Yes    Types: Marijuana    Comment: occasionally  . Sexual activity: No    Birth control/protection: None  Other Topics Concern  . None  Social History Narrative  . None   Additional Social History:                         Sleep: Good  Appetite:  Good  Current Medications: Current Facility-Administered Medications  Medication Dose Route Frequency Provider Last Rate Last Dose  . acetaminophen (TYLENOL) tablet 650 mg  650 mg Oral Q6H PRN Laveda AbbeParks, Laurie Britton, NP      . alum & mag hydroxide-simeth (MAALOX/MYLANTA) 200-200-20 MG/5ML suspension 30 mL  30 mL Oral Q4H PRN Laveda AbbeParks, Laurie Britton, NP   30 mL at 02/09/17 0753  . asenapine (SAPHRIS) sublingual tablet 5 mg  5 mg Sublingual BID Micheal Likensainville, Saylah Ketner T, MD   5 mg at 02/10/17 0746  . benztropine (COGENTIN) tablet 1 mg  1 mg Oral QHS Laveda AbbeParks, Laurie Britton, NP   1 mg at 02/09/17 2000  . lithium carbonate (ESKALITH) CR tablet 450 mg  450 mg Oral Q12H Micheal Likensainville, Roberts Bon T, MD   450 mg at 02/10/17 0746  . LORazepam (ATIVAN) tablet 1 mg  1 mg Oral Q8H PRN Micheal Likens[place], Braydyn Schultes T, MD   1 mg at 02/10/17 0225  . magnesium hydroxide (MILK OF MAGNESIA) suspension 30 mL  30 mL Oral Daily PRN Laveda AbbeParks, Laurie Britton, NP      . nicotine (NICODERM CQ - dosed in mg/24 hours) patch 21 mg  21 mg Transdermal Daily Laveda AbbeParks, Laurie Britton, NP      . OLANZapine zydis (ZYPREXA) disintegrating tablet 10 mg  10 mg Oral Q8H PRN Micheal Likens[place], Corynn Solberg T, MD   10 mg at 02/09/17 2353   Or  . ziprasidone (GEODON) injection 20 mg  20 mg Intramuscular Q12H PRN Micheal Likensainville, Margrett Kalb T, MD   20 mg at 02/09/17 1439  . ondansetron (ZOFRAN) tablet 4 mg  4 mg Oral Q8H PRN Laveda AbbeParks, Laurie Britton, NP      . traZODone (DESYREL) tablet 100 mg  100 mg Oral QHS Laveda AbbeParks, Laurie Britton, NP   100 mg at 02/09/17 2000    Lab Results:  Results for orders placed or performed during the hospital encounter of  02/08/17 (from the past 48 hour(s))  Lithium level     Status: Abnormal   Collection Time: 02/09/17  6:10 PM  Result Value Ref Range   Lithium Lvl 0.22 (L) 0.60 - 1.20 mmol/L    Comment: Performed at [facility], [address] [place], [place] [address]  T4, free     Status: Abnormal   Collection Time: 02/09/17  6:10 PM  Result Value Ref Range   Free T4 1.15 (H) 0.61 - 1.12 ng/dL    Comment: (NOTE) Biotin ingestion may interfere with free  T4 tests. If the results are inconsistent with the TSH level, previous test results, or the clinical presentation, then consider biotin interference. If needed, order repeat testing after stopping biotin. Performed at [facility], [address], [place], [place] [address]   TSH     Status: None   Collection Time: 02/09/17  6:10 PM  Result Value Ref Range   TSH 1.407 0.350 - 4.500 uIU/mL    Comment: Performed by a 3rd Generation assay with a functional sensitivity of <=0.01 uIU/mL. Performed at [facility], [address] [place], [place] [address]     Blood Alcohol level:  Lab Results  Component Value Date   [facility] <10 02/06/2017   ETH <5 02/18/2016    Metabolic Disorder Labs: Lab Results  Component Value Date   HGBA1C 6.5 (H) 09/30/2014   MPG 140 09/30/2014   MPG 134 (H) 12/26/2012   Lab Results  Component Value Date   PROLACTIN 150.0 (H) 08/22/2014   Lab Results  Component Value Date   CHOL 137 09/30/2014   TRIG 98 09/30/2014   HDL 37 (L) 09/30/2014   CHOLHDL 3.7 09/30/2014   VLDL 20 09/30/2014   LDLCALC 80 09/30/2014    Physical Findings: AIMS: Facial and Oral Movements Muscles of Facial Expression: None, normal Lips and Perioral Area: None, normal Jaw: None, normal Tongue: None, normal,Extremity Movements Upper (arms, wrists, hands, fingers): None, normal Lower (legs, knees, ankles, toes): None, normal, Trunk Movements Neck, shoulders, hips: None, normal, Overall  Severity Severity of abnormal movements (highest score from questions above): None, normal Incapacitation due to abnormal movements: None, normal Patient's awareness of abnormal movements (rate only patient's report): No Awareness, Dental Status Current problems with teeth and/or dentures?: No Does patient usually wear dentures?: No  CIWA:    COWS:     Musculoskeletal: Strength & Muscle Tone: within normal limits Gait & Station: normal Patient leans: N/A  Psychiatric Specialty Exam: Physical Exam  Nursing note and vitals reviewed.   Review of Systems  Constitutional: Negative for chills and fever.  Respiratory: Negative for cough and shortness of breath.   Cardiovascular: Negative for chest pain.  Gastrointestinal: Negative for heartburn and nausea.  Psychiatric/Behavioral: Negative for depression, hallucinations and suicidal ideas. The patient is nervous/anxious.     Blood pressure 115/83, pulse (!) 112, temperature 97.9 F (36.6 C), temperature source Oral, resp. rate 20, height 5\' 3"  (1.6 m), weight 79.8 kg (176 lb), SpO2 100 %.Body mass index is 31.18 kg/m.  General Appearance: Casual and Fairly Groomed  Eye Contact:  Good  Speech:  Clear and Coherent and Normal Rate  Volume:  Normal  Mood:  Angry, Anxious and Irritable  Affect:  Blunt and Labile  Thought Process:  Coherent and Goal Directed  Orientation:  Full (Time, Place, and Person)  Thought Content:  Paranoid Ideation  Suicidal Thoughts:  No  Homicidal Thoughts:  No  Memory:  Immediate;   Fair Recent;   Fair Remote;   Fair  Judgement:  Impaired  Insight:  Lacking  Psychomotor Activity:  Normal  Concentration:  Concentration: Fair  Recall:  [employer] of Knowledge:  Fair  Language:  Fair  Akathisia:  No  Handed:    AIMS (if indicated):     Assets:  Manufacturing systems engineer Physical Health Resilience Social Support  ADL's:  Intact  Cognition:  WNL  Sleep:  Number of Hours: 1.75     Treatment Plan  Summary: Daily contact with patient to  assess and evaluate symptoms and progress in treatment and Medication management. Pt continues to have symptoms of mania including irritability, distractibility, and flight of ideas but she is improving with taking her medications. - Continue inpatient level of care  - Labs:  lithium level 0.22 on 02/09/17, next level on Monday 02/13/17  - Schizoaffective bipolar type - Continue lithium 450mg  BID - Continue saphris 5mg  BID - Insomnia - Continue trazodone 50mg  qhs prn insomnia - Anxiety - Continue ativan 1mg  q8h prn anxiety  -Encourage participation in groups and therapeutic milieu -Discharge planning will be ongoing     Micheal Likens, MD 02/10/2017, 11:24 AM

## 2017-02-10 NOTE — Progress Notes (Signed)
Recreation Therapy Notes  Date: 02/10/17 Time: 1045 Location: Gym  Group Topic: Wellness  Goal Area(s) Addresses:  Patient will define components of whole wellness. Patient will verbalize benefit of whole wellness.  Behavioral Response: None  Intervention: 4 rubber bases, rubber ball   Activity: Kickball.  Patients were divided into two teams.  Each team would go until they reached three outs.  The teams would then reverse fields and the other team would get a chance to go.  The team that finished with the most points, wins the game.  Education: Wellness, Building control surveyorDischarge Planning.   Education Outcome: Acknowledges education/In group clarification offered/Needs additional education.   Clinical Observations/Feedback:  Pt did not participate.  Pt sat and watched.    Caroll RancherMarjette Fann, LRT/CTRS         Caroll RancherLindsay, Blondie Riggsbee A 02/10/2017 11:50 AM

## 2017-02-10 NOTE — Progress Notes (Signed)
D: Pt denies SI/HI/AVH. Pt is labile, irritable and upset. Pt acts sarcastic at times. Pt is very moody and labile this evening, when discussing her night medications pt stated " I don't take Risperdal, I'm not taking Risperdal, I'm allergic to that". Pt was informed that writer did not mention Risperdal. Pt continues to blame others for her situation, pt continues to believe there is no reason for her being here because she is "fine" .   A: Pt was offered support and encouragement. Pt was given scheduled medications. Pt was encourage to attend groups. Q 15 minute checks were done for safety.   R: Pt is taking medication. Pt has no complaints.Pt receptive to treatment and safety maintained on unit.

## 2017-02-10 NOTE — Progress Notes (Signed)
1:1 Nursing Progress Note  D: Patient is observed sleeping in bed. Respirations are even & unlabored. Environment is secured. Sitter observed with patient.  A: Patient remains on 1:1 observation per provider orders. Hgh fall risk precautions in place. Patient safety monitored with q15 minute safety checks.  R: Patient remains safe on the unit at this time. Will continue to monitor with 1:1 sitter, q15 min safety checks & q4 hour nursing assessments.

## 2017-02-10 NOTE — Progress Notes (Signed)
[facility] Post 1:1 Observation Documentation  For the first (8) hours following discontinuation of 1:1 precautions, a progress note entry by nursing staff should be documented at least every 2 hours, reflecting the patient's behavior, condition, mood, and conversation.  Use the progress notes for additional entries.  Time 1:1 discontinued:  11:20 am   Patient's Behavior:  Patient calm cooperative no incidents of behavioral dyscontrol    Patient's Condition:  Patient free to self and others   Patient's Conversation: Patient has been notably agitated while talking with mother, but easily redirected when off of the phone.   Jerrye BushyLaRonica R Mishka Stegemann 02/10/2017, 11:20 AM

## 2017-02-10 NOTE — Progress Notes (Addendum)
Patient has been up to the nurses's station several times in the last hour. Patient with sitter and patient is steady on her feet. Patient reports anxiety, increased agitation and is loud. Patient encouraged to speak softer due to sleeping peers. Patient provided PRN medications as ordered. Patient encouraged to return to bed. Patient reports, "I can't sleep sometimes". Patient returns to her room in no acute distress.

## 2017-02-11 NOTE — Progress Notes (Signed)
[facility]  02/11/2017 2:48 PM Barbara SenterChanda D [name]  MRN:  [id number]   Subjective: Barbara KeelChanda reports, "I'm doing okay. There was nothing wrong or going on with me in the first place. My mother is my problem. She had sent some text to her sons & I was blamed for it. She is the one that committed me here. I was not suppose to be here. I was not manic because I was taking my medicines. I was doing just fine. I have been calling her today for us to talk, but, she will not answer the phone. What should I do?"  Objective: Barbara KindChanda [name] is a 41 y/o F with history of schizoaffective disorder bipolar type who was admitted on IVC placed by her mother with concern for worsening symptoms of mania and threats of violence to her family. Pt presented initially as irritable and agitated upon evaluation in ED, and she continued to be [facility]able [facility]. She agreed to be restarted on lithium and she also was accepting of trial of saphris. Pt had some ongoing agitation and intrusive behaviors which resulted in being placed on 1:1 with staff.   Today 02-11-17, upon evaluation, pt presents more calm, with some mild mood lability present. She continues to expresse anger at her mother for "locking me up in here when she should be the one in here." She perseverates on her mother lying about her symptoms to have her admitted to the hospital, but she denies having anything wrong with her prior to being brought to the hospital". She says she has called her mother several times today for them to talk, but her mother is not answering the phone. Pt denies SI/HI/AH/VH. She notes that she is tolerating her medications without difficulty or side effects. She is sleeping well and her appetite is good. Pt was previously observed talking to her mother on the phone and slamming the phone multiple times on the unit. Pt's discharge plan is to return to staying with her mother, and we discussed because there is still so much  ongoing tension, that pt should plan to stay on the inpatient unit until her symptoms improve, and pt was in agreement.  Discussed with patient about her treatment plan. She agrees to continue her current regimen without changes at this time. Her Lithium dose outside the hospital was confirmed as Lithium ER 300mg  BID and her level drawn on 02/09/17 was 0.22. She was restarted at higher dose of Lithium 450mg  BID and we will plan to recheck a lithium level on 02/13/17. She had no further questions, comments, or concerns.   Principal Problem: Schizoaffective disorder, [name] Diagnosis:   Patient Active Problem List   Diagnosis Date Noted  . Schizoaffective disorder, [name] [F25.0] 02/08/2017  . Adjustment disorder with disturbance of emotion [F43.29] 12/13/2016  . Agitation [R45.1]   . Mood disorder (HCC) [F39]   . Affective psychosis, bipolar (HCC) [F31.9]   . Bipolar affective disorder, currently manic, severe, with psychotic features (HCC) [F31.2] 09/24/2014  . Homicidal ideation [R45.850]   . Bipolar disorder, current episode manic w/o psychotic features, severe (HCC) [F31.13] 08/21/2014  . Cannabis use disorder, moderate, dependence (HCC) [F12.20] 08/21/2014  . Tobacco use disorder [F17.200] 08/21/2014   Total Time spent with patient: 15 minutes  Past Psychiatric History: See H&P  Past Medical History:  Past Medical History:  Diagnosis Date  . Anxiety   . Bipolar affective (HCC)   . OCD (obsessive compulsive disorder)  History reviewed. No pertinent surgical history. Family History:  Family History  Problem Relation Age of Onset  . Drug abuse Father   . Mental illness Other    Family Psychiatric  History: See H&P Social History:  Social History   Substance and Sexual Activity  Alcohol Use Yes   Comment: socially     Social History   Substance and Sexual Activity  Drug Use Yes  . Types: Marijuana   Comment: occasionally    Social History    Socioeconomic History  . Marital status: Single    Spouse name: None  . Number of children: None  . Years of education: None  . Highest education level: None  Social Needs  . Financial resource strain: None  . Food insecurity - worry: None  . Food insecurity - inability: None  . Transportation needs - medical: None  . Transportation needs - non-medical: None  Occupational History  . None  Tobacco Use  . Smoking status: Current Every Day Smoker    Packs/day: 1.00    Years: 1.00    Pack years: 1.00    Types: Cigarettes  . Smokeless tobacco: Never Used  Substance and Sexual Activity  . Alcohol use: Yes    Comment: socially  . Drug use: Yes    Types: Marijuana    Comment: occasionally  . Sexual activity: No    Birth control/protection: None  Other Topics Concern  . None  Social History Narrative  . None   Additional Social History:   Sleep: Good  Appetite:  Good  Current Medications: Current Facility-Administered Medications  Medication Dose Route Frequency Provider Last Rate Last Dose  . acetaminophen (TYLENOL) tablet 650 mg  650 mg Oral Q6H PRN Laveda AbbeParks, Laurie Britton, NP      . alum & mag hydroxide-simeth (MAALOX/MYLANTA) 200-200-20 MG/5ML suspension 30 mL  30 mL Oral Q4H PRN Laveda AbbeParks, Laurie Britton, NP   30 mL at 02/09/17 0753  . asenapine (SAPHRIS) sublingual tablet 5 mg  5 mg Sublingual BID Micheal Likensainville, Christopher T, MD   5 mg at 02/11/17 [id number]  . benztropine (COGENTIN) tablet 1 mg  1 mg Oral QHS Laveda AbbeParks, Laurie Britton, NP   1 mg at 02/10/17 2041  . lithium carbonate (ESKALITH) CR tablet 450 mg  450 mg Oral Q12H Micheal Likensainville, Christopher T, MD   450 mg at 02/11/17 [id number]  . LORazepam (ATIVAN) tablet 1 mg  1 mg Oral Q8H PRN Micheal Likensainville, Christopher T, MD   1 mg at 02/10/17 1830  . magnesium hydroxide (MILK OF MAGNESIA) suspension 30 mL  30 mL Oral Daily PRN Laveda AbbeParks, Laurie Britton, NP      . nicotine polacrilex (NICORETTE) gum 2 mg  2 mg Oral PRN Micheal Likensainville, Christopher T, MD       . OLANZapine zydis (ZYPREXA) disintegrating tablet 10 mg  10 mg Oral Q8H PRN Micheal Likensainville, Christopher T, MD   10 mg at 02/09/17 2353   Or  . ziprasidone (GEODON) injection 20 mg  20 mg Intramuscular Q12H PRN Micheal Likensainville, Christopher T, MD   20 mg at 02/09/17 1439  . ondansetron (ZOFRAN) tablet 4 mg  4 mg Oral Q8H PRN Laveda AbbeParks, Laurie Britton, NP      . traZODone (DESYREL) tablet 100 mg  100 mg Oral QHS Laveda AbbeParks, Laurie Britton, NP   100 mg at 02/10/17 2041    Lab Results:  Results for orders placed or performed during the hospital encounter of 02/08/17 (from the past 48 hour(s))  Lithium level  Status: Abnormal   Collection Time: 02/09/17  6:10 PM  Result Value Ref Range   Lithium Lvl 0.22 (L) 0.60 - 1.20 mmol/L    Comment: Performed at [facility], [address], [place], [place] [address]  T4, free     Status: Abnormal   Collection Time: 02/09/17  6:10 PM  Result Value Ref Range   Free T4 1.15 (H) 0.61 - 1.12 ng/dL    Comment: (NOTE) Biotin ingestion may interfere with free T4 tests. If the results are inconsistent with the TSH level, previous test results, or the clinical presentation, then consider biotin interference. If needed, order repeat testing after stopping biotin. Performed at [facility], [address], [place], [place] [address]   TSH     Status: None   Collection Time: 02/09/17  6:10 PM  Result Value Ref Range   TSH 1.407 0.350 - 4.500 uIU/mL    Comment: Performed by a 3rd Generation assay with a functional sensitivity of <=0.01 uIU/mL. Performed at [facility], [address] [place], [place] [address]    Blood Alcohol level:  Lab Results  Component Value Date   [facility] <10 02/06/2017   ETH <5 02/18/2016   Metabolic Disorder Labs: Lab Results  Component Value Date   HGBA1C 6.5 (H) 09/30/2014   MPG 140 09/30/2014   MPG 134 (H) 12/26/2012   Lab Results  Component Value Date   PROLACTIN 150.0 (H) 08/22/2014    Lab Results  Component Value Date   CHOL 137 09/30/2014   TRIG 98 09/30/2014   HDL 37 (L) 09/30/2014   CHOLHDL 3.7 09/30/2014   VLDL 20 09/30/2014   LDLCALC 80 09/30/2014   Physical Findings: AIMS: Facial and Oral Movements Muscles of Facial Expression: None, normal Lips and Perioral Area: None, normal Jaw: None, normal Tongue: None, normal,Extremity Movements Upper (arms, wrists, hands, fingers): None, normal Lower (legs, knees, ankles, toes): None, normal, Trunk Movements Neck, shoulders, hips: None, normal, Overall Severity Severity of abnormal movements (highest score from questions above): None, normal Incapacitation due to abnormal movements: None, normal Patient's awareness of abnormal movements (rate only patient's report): No Awareness, Dental Status Current problems with teeth and/or dentures?: No Does patient usually wear dentures?: No  CIWA:    COWS:     Musculoskeletal: Strength & Muscle Tone: within normal limits Gait & Station: normal Patient leans: N/A  Psychiatric Specialty Exam: Physical Exam  Nursing note and vitals reviewed.   Review of Systems  Constitutional: Negative for chills and fever.  Respiratory: Negative for cough and shortness of breath.   Cardiovascular: Negative for chest pain.  Gastrointestinal: Negative for heartburn and nausea.  Psychiatric/Behavioral: Negative for depression, hallucinations and suicidal ideas. The patient is nervous/anxious.     Blood pressure 117/76, pulse (!) 111, temperature 97.6 F (36.4 C), temperature source Oral, resp. rate 18, height 5\' 3"  (1.6 m), weight 79.8 kg (176 lb), SpO2 100 %.Body mass index is 31.18 kg/m.  General Appearance: Casual and Fairly Groomed  Eye Contact:  Good  Speech:  Clear and Coherent and Normal Rate  Volume:  Normal  Mood:  Angry, Anxious and Irritable  Affect:  Blunt and Labile  Thought Process:  Coherent and Goal Directed  Orientation:  Full (Time, Place, and Person)   Thought Content:  Paranoid Ideation  Suicidal Thoughts:  No  Homicidal Thoughts:  No  Memory:  Immediate;   Fair Recent;   Fair Remote;   Fair  Judgement:  Impaired  Insight:  Lacking  Psychomotor Activity:  Normal  Concentration:  Concentration: Fair  Recall:  [employer] of Knowledge:  Fair  Language:  Fair  Akathisia:  No  Handed:    AIMS (if indicated):     Assets:  Manufacturing systems engineer Physical Health Resilience Social Support  ADL's:  Intact  Cognition:  WNL  Sleep:  Number of Hours: 2   Treatment Plan Summary: Daily contact with patient to assess and evaluate symptoms and progress in treatment and Medication management. Pt continues to have symptoms of mania including irritability, distractibility, and flight of ideas, however, she is improving with taking her medications. Today, she presents more reasonable & calmer.  - Continue inpatient level of care  Will continue today 02/11/2017 plan as below except where it is noted.  - Labs:  lithium level 0.22 on 02/09/17, next level on Monday 02/13/17  - Schizoaffective bipolar type - Continue lithium 450mg  BID - Continue saphris 5mg  BID - Insomnia - Continue trazodone 50mg  qhs prn insomnia - Anxiety - Continue ativan 1mg  q8h prn anxiety  -Encourage participation in groups and therapeutic milieu -Discharge planning will be ongoing  Sanjuana Kava, NP, PMHNP, FNP-BC. 02/11/2017, 2:48 PMPatient ID: [name], female   DOB: Oct 20, 1975, 41 y.o.   MRN: [id number]

## 2017-02-11 NOTE — [facility] Group Notes (Signed)
  [facility] LCSW Group Therapy Note  Date/Time:  02/11/2017 11:15AM-12:00PM  Type of Therapy and Topic:  Group Therapy:  Feelings About Hospitalization  Participation Level:  Active   Description of Group This process group involved patients discussing their feelings related to being hospitalized, as well as the benefits they see to being in the hospital.  These feelings and benefits were itemized.  The group then brainstormed specific ways in which they could seek those same benefits when they discharge and return home.  Therapeutic Goals 1. Patient will identify and describe positive and negative feelings related to hospitalization 2. Patient will verbalize benefits of hospitalization to themselves personally 3. Patients will brainstorm together ways they can obtain similar benefits in the outpatient setting, identify barriers to wellness and possible solutions  Summary of Patient Progress:  The patient expressed her primary feelings about being hospitalized are anxious and uneasy because she does not have resources for her discharge.  She was able to appropriately address how she can take care of herself at discharge, but did not want to talk about the medication piece, kept saying that her doctor won't listen to her talk about the research she has done on medications that have been recommended.  Therapeutic Modalities Cognitive Behavioral Therapy Motivational Interviewing    Ambrose MantleMareida Grossman-Orr, LCSW 02/11/2017, 12:53 PM

## 2017-02-11 NOTE — Progress Notes (Signed)
Patient present in the milieu and interactive with peers and staff. Extremely anxious and angry that mother is not answering her phone calls. Repeatedly asked if mother could have her committed long term to [place]. Met and talked with Education officer, museum.

## 2017-02-12 NOTE — Progress Notes (Signed)
Patient reported that she is happier today because she was able to talk with her mother last night, though she feels that mother is still very angry with her. Minimal interaction in milieu,isolating in room much of the day. Denies AVH, denies SI/HI.

## 2017-02-12 NOTE — [facility] Group Notes (Signed)
[facility] LCSW Group Therapy Note  Date/Time:  02/12/2017  11:00AM-12:00PM  Type of Therapy and Topic:  Group Therapy:  Music and Mood  Participation Level:  Minimal   Description of Group: In this process group, members listened to a variety of genres of music and identified that different types of music evoke different responses.  Patients were encouraged to identify music that was soothing for them and music that was energizing for them.  Patients discussed how this knowledge can help with wellness and recovery in various ways including managing depression and anxiety as well as encouraging healthy sleep habits.    Therapeutic Goals: 1. Patients will explore the impact of different varieties of music on mood 2. Patients will verbalize the thoughts they have when listening to different types of music 3. Patients will identify music that is soothing to them as well as music that is energizing to them 4. Patients will discuss how to use this knowledge to assist in maintaining wellness and recovery 5. Patients will explore the use of music as a coping skill  Summary of Patient Progress:  Although not present until about 30 minutes into group, as soon as she arrived, patient expressed not liking the song that was playing, demanding that it be turned off.  The rules were reviewed again, and she still wanted that song stopped, then demanded that the next be stopped as well.  She stated she really enjoyed the next 2 songs, then said she was sleepy and had nothing against the current song but was going back to sleep.  Therapeutic Modalities: Solution Focused Brief Therapy Motivational Interviewing Activity   Ambrose MantleMareida Grossman-Orr, LCSW 02/12/2017 11:59 AM

## 2017-02-12 NOTE — Plan of Care (Signed)
Pt safe on the unit at this time 

## 2017-02-12 NOTE — Progress Notes (Signed)
D: Pt denies SI/HI/AVH. Pt is pleasant and cooperative. Pt stated she felt good , she thinks the meds are working good for her. Pt stated she usually stop her meds when she leaves because they make her too drowsy . Pt continues to be intrusive with poor boundaries.   A: Pt was offered support and encouragement. Pt was given scheduled medications. Pt was encourage to attend groups. Q 15 minute checks were done for safety.   R:Pt attends groups and interacts well with peers and staff. Pt is taking medication. Pt receptive to treatment and safety maintained on unit.

## 2017-02-12 NOTE — Progress Notes (Signed)
[facility] MD Progress Note  02/12/2017 1:07 PM [name]  MRN:  [id number]   Subjective: [name] reports " My mother said she will answer the phone so that I can be discharged on Monday or Tuesday"   Objective: [name] is awake, alert and oriented. Seen resting in bedroom.  Denies suicidal or homicidal ideation. Reports feeling a lot better today. Reports she is medication compliant  With Saphris, and Cogentin. reports she is tolerating medications well. [name] denied depression or depressive symptoms. Report her mother continues to  ignoring her calls, however she answered her phone on yesterday and this morning.Support, encouragement and reassurance was provided.   Principal Problem: Schizoaffective disorder, [name] Diagnosis:   Patient Active Problem List   Diagnosis Date Noted  . Schizoaffective disorder, [name] [F25.0] 02/08/2017  . Adjustment disorder with disturbance of emotion [F43.29] 12/13/2016  . Agitation [R45.1]   . Mood disorder (HCC) [F39]   . Affective psychosis, bipolar (HCC) [F31.9]   . Bipolar affective disorder, currently manic, severe, with psychotic features (HCC) [F31.2] 09/24/2014  . Homicidal ideation [R45.850]   . Bipolar disorder, current episode manic w/o psychotic features, severe (HCC) [F31.13] 08/21/2014  . Cannabis use disorder, moderate, dependence (HCC) [F12.20] 08/21/2014  . Tobacco use disorder [F17.200] 08/21/2014   Total Time spent with patient: 15 minutes  Past Psychiatric History: See H&P  Past Medical History:  Past Medical History:  Diagnosis Date  . Anxiety   . Bipolar affective (HCC)   . OCD (obsessive compulsive disorder)    History reviewed. No pertinent surgical history. Family History:  Family History  Problem Relation Age of Onset  . Drug abuse Father   . Mental illness Other    Family Psychiatric  History: See H&P Social History:  Social History   Substance and Sexual Activity  Alcohol  Use Yes   Comment: socially     Social History   Substance and Sexual Activity  Drug Use Yes  . Types: Marijuana   Comment: occasionally    Social History   Socioeconomic History  . Marital status: Single    Spouse name: None  . Number of children: None  . Years of education: None  . Highest education level: None  Social Needs  . Financial resource strain: None  . Food insecurity - worry: None  . Food insecurity - inability: None  . Transportation needs - medical: None  . Transportation needs - non-medical: None  Occupational History  . None  Tobacco Use  . Smoking status: Current Every Day Smoker    Packs/day: 1.00    Years: 1.00    Pack years: 1.00    Types: Cigarettes  . Smokeless tobacco: Never Used  Substance and Sexual Activity  . Alcohol use: Yes    Comment: socially  . Drug use: Yes    Types: Marijuana    Comment: occasionally  . Sexual activity: No    Birth control/protection: None  Other Topics Concern  . None  Social History Narrative  . None   Additional Social History:   Sleep: Good  Appetite:  Good  Current Medications: Current Facility-Administered Medications  Medication Dose Route Frequency Provider Last Rate Last Dose  . acetaminophen (TYLENOL) tablet 650 mg  650 mg Oral Q6H PRN Laveda Abbe, NP   650 mg at 02/11/17 2338  . alum & mag hydroxide-simeth (MAALOX/MYLANTA) 200-200-20 MG/5ML suspension 30 mL  30 mL Oral Q4H PRN Laveda Abbe, NP  30 mL at 02/09/17 0753  . asenapine (SAPHRIS) sublingual tablet 5 mg  5 mg Sublingual BID Micheal Likens[place], Christopher T, MD   5 mg at 02/12/17 0815  . benztropine (COGENTIN) tablet 1 mg  1 mg Oral QHS Laveda AbbeParks, Laurie Britton, NP   1 mg at 02/11/17 2046  . lithium carbonate (ESKALITH) CR tablet 450 mg  450 mg Oral Q12H Micheal Lik[place], Christopher T, MD   450 mg at 02/12/17 0815  . LORazepam (ATIVAN) tablet 1 mg  1 mg Oral Q8H PRN Micheal Lik[place], Christopher T, MD   1 mg at 02/11/17 2338  . magnesium  hydroxide (MILK OF MAGNESIA) suspension 30 mL  30 mL Oral Daily PRN Laveda AbbeParks, Laurie Britton, NP      . nicotine polacrilex (NICORETTE) gum 2 mg  2 mg Oral PRN Micheal Likens[place], Christopher T, MD      . OLANZapine zydis (ZYPREXA) disintegrating tablet 10 mg  10 mg Oral Q8H PRN Micheal Lik[place], Christopher T, MD   10 mg at 02/09/17 2353   Or  . ziprasidone (GEODON) injection 20 mg  20 mg Intramuscular Q12H PRN Micheal Likens[place], Christopher T, MD   20 mg at 02/09/17 1439  . ondansetron (ZOFRAN) tablet 4 mg  4 mg Oral Q8H PRN Laveda AbbeParks, Laurie Britton, NP      . traZODone (DESYREL) tablet 100 mg  100 mg Oral QHS Laveda AbbeParks, Laurie Britton, NP   100 mg at 02/11/17 2046    Lab Results:  No results found for this or any previous visit (from the past 48 hour(s)). Blood Alcohol level:  Lab Results  Component Value Date   ETH <10 02/06/2017   ETH <5 02/18/2016   Metabolic Disorder Labs: Lab Results  Component Value Date   HGBA1C 6.5 (H) 09/30/2014   MPG 140 09/30/2014   MPG 134 (H) 12/26/2012   Lab Results  Component Value Date   PROLACTIN 150.0 (H) 08/22/2014   Lab Results  Component Value Date   CHOL 137 09/30/2014   TRIG 98 09/30/2014   HDL 37 (L) 09/30/2014   CHOLHDL 3.7 09/30/2014   VLDL 20 09/30/2014   LDLCALC 80 09/30/2014   Physical Findings: AIMS: Facial and Oral Movements Muscles of Facial Expression: None, normal Lips and Perioral Area: None, normal Jaw: None, normal Tongue: None, normal,Extremity Movements Upper (arms, wrists, hands, fingers): None, normal Lower (legs, knees, ankles, toes): None, normal, Trunk Movements Neck, shoulders, hips: None, normal, Overall Severity Severity of abnormal movements (highest score from questions above): None, normal Incapacitation due to abnormal movements: None, normal Patient's awareness of abnormal movements (rate only patient's report): No Awareness, Dental Status Current problems with teeth and/or dentures?: No Does patient usually wear dentures?: No   CIWA:    COWS:     Musculoskeletal: Strength & Muscle Tone: within normal limits Gait & Station: normal Patient leans: N/A  Psychiatric Specialty Exam: Physical Exam  Nursing note and vitals reviewed. Cardiovascular: Normal rate.  Psychiatric: She has a normal mood and affect. Her behavior is normal.    Review of Systems  Psychiatric/Behavioral: Negative for depression, hallucinations and suicidal ideas. The patient is nervous/anxious.   All other systems reviewed and are negative.   Blood pressure 116/67, pulse (!) 101, temperature 97.7 F (36.5 C), resp. rate 18, height 5\' 3"  (1.6 m), weight 79.8 kg (176 lb), SpO2 100 %.Body mass index is 31.18 kg/m.  General Appearance: Casual  Eye Contact:  Good  Speech:  Clear and Coherent and Normal Rate  Volume:  Normal  Mood:  Dysphoric and Irritable  Affect:  Blunt and Labile  Thought Process:  Coherent and Goal Directed  Orientation:  Full (Time, Place, and Person)  Thought Content:  Paranoid Ideation  Suicidal Thoughts:  No  Homicidal Thoughts:  No  Memory:  Immediate;   Fair Recent;   Fair Remote;   Fair  Judgement:  Impaired  Insight:  Lacking  Psychomotor Activity:  Normal  Concentration:  Concentration: Fair  Recall:  [employer]  Fund of Knowledge:  Fair  Language:  Fair  Akathisia:  No  Handed:    AIMS (if indicated):     Assets:  Manufacturing systems engineerCommunication Skills Physical Health Resilience Social Support  ADL's:  Intact  Cognition:  WNL  Sleep:  Number of Hours: 5.5   Treatment Plan Summary: Daily contact with patient to assess and evaluate symptoms and progress in treatment and Medication management.    Will continue today 02/12/2017 plan as below except where it is noted.  - Labs:  lithium level 0.22 on 02/09/17, next level on Monday 02/13/17  - Schizoaffective bipolar type - Continue lithium 450mg  BID - Continue saphris 5mg  BID - Insomnia - Continue trazodone 50mg  qhs prn  insomnia - Anxiety - Continue ativan 1mg  q8h prn anxiety  -Encourage participation in groups and therapeutic milieu -Discharge planning will be ongoing  Barbara Rackanika N , NP 02/12/2017, 1:07 PM

## 2017-02-13 LAB — LITHIUM LEVEL: Lithium Lvl: 0.66 mmol/L (ref 0.60–1.20)

## 2017-02-13 NOTE — Progress Notes (Signed)
Patient ID: Barbara SenterChanda D Deupree, female   DOB: 07/10/1975, 41 y.o.   MRN: [id number] PER STATE REGULATIONS 482.30  THIS CHART WAS REVIEWED FOR MEDICAL NECESSITY WITH RESPECT TO THE PATIENT'S ADMISSION/ DURATION OF STAY.  NEXT REVIEW DATE: 02/16/2017  Willa RoughJENNIFER JONES Lisia Westbay, RN, BSN CASE MANAGER

## 2017-02-13 NOTE — Plan of Care (Signed)
  Health Behavior/Discharge Planning: Compliance with prescribed medication regimen will improve 02/13/2017 1432 - Progressing by Lenord Fellersopson, Kialee Kham Elizabeth, RN

## 2017-02-13 NOTE — Progress Notes (Signed)
D: Pt denies SI/HI/AVH. Pt is pleasant and cooperative. Pt been visible on the unit and has been appropriate with staff and peers   A: Pt was offered support and encouragement. Pt was given scheduled medications. Pt was encourage to attend groups. Q 15 minute checks were done for safety.  R:Pt attends groups and interacts well with peers and staff. Pt is taking medication. Pt has no complaints.Pt receptive to treatment and safety maintained on unit.

## 2017-02-13 NOTE — Progress Notes (Signed)
Patient ID: Barbara SenterChanda D [name], female   DOB: 01-Jul-1975, 41 y.o.   MRN: [id number]  DAR: Pt. Denies SI/HI and A/V Hallucinations. She reports that her sleep last night was good, her appetite is good, her energy level is normal, and her concentration is good. She rates her depression level 4/10 today. Patient does not report any pain or discomfort at this time. Support and encouragement provided to the patient. Scheduled medications administered to patient per physician's orders. Patient is seen intermittently in the milieu and is polite and cooperative with staff. She does not appear to be interacting with peers at this time. Q15 minute checks are maintained for safety.

## 2017-02-13 NOTE — Plan of Care (Signed)
  Coping: Ability to verbalize frustrations and anger appropriately will improve 02/13/2017 2326 - Progressing by Delos HaringPhillips, Shanika Levings A, RN Note Pt has been less irritable on the unit this evening   Safety: Ability to redirect hostility and anger into socially appropriate behaviors will improve 02/13/2017 2326 - Progressing by Delos HaringPhillips, Hakeem Frazzini A, RN Note Pt safe on the unit and has not argued with anyone this evening

## 2017-02-13 NOTE — Progress Notes (Signed)
[facility] MD Progress Note  02/13/2017 11:21 AM CYNDY BRAVER  MRN:  [id number]   Subjective: [name] reports, "I'm doing okay. The doctor told me that I will be going home today or tomorrow. The medications are helping me a lot"  Objective: [name] is a 40 y/o F with history of schizoaffective disorder bipolar type who was admitted on IVC placed by her mother with concern for worsening symptoms of mania and threats of violence to her family. Pt presented initially as irritable and agitated upon evaluation in ED, and she continued to be irritable once admitted to [facility]. She agreed to be restarted on lithium and she also was accepting of trial of saphris. Pt had some ongoing agitation and intrusive behaviors which resulted in being placed on 1:1 with staff.   Today 02-13-17, upon evaluation, pt presents more calm & logical. She says he is doing well. Denies any new issues. Says the doctor had told her that she is getting discharged today or tomorrow, but guessed it will be tomorrow because the roads are bad out there. She notes that she is tolerating her medications without difficulty or side effects. She is sleeping well and her appetite is good. Discussed with patient about her treatment plan. She agrees to continue her current regimen without changes at this time. Her Lithium dose outside the hospital was confirmed as Lithium ER 300mg  BID and her level drawn on 02/09/17 was 0.22. She was restarted at higher dose of Lithium 450mg  BID and we will plan to recheck a lithium level on 02/13/17, results pending. She had no further questions, comments, or concerns. She does not appear to be responding to any internal stimuli.   Principal Problem: Schizoaffective disorder, [name]  Diagnosis:   Patient Active Problem List   Diagnosis Date Noted  . Schizoaffective disorder, [name] [F25.0] 02/08/2017  . Adjustment disorder with disturbance of emotion [F43.29] 12/13/2016  . Agitation  [R45.1]   . Mood disorder (HCC) [F39]   . Affective psychosis, bipolar (HCC) [F31.9]   . Bipolar affective disorder, currently manic, severe, with psychotic features (HCC) [F31.2] 09/24/2014  . Homicidal ideation [R45.850]   . Bipolar disorder, current episode manic w/o psychotic features, severe (HCC) [F31.13] 08/21/2014  . Cannabis use disorder, moderate, dependence (HCC) [F12.20] 08/21/2014  . Tobacco use disorder [F17.200] 08/21/2014   Total Time spent with patient: 15 minutes  Past Psychiatric History: See H&P  Past Medical History:  Past Medical History:  Diagnosis Date  . Anxiety   . Bipolar affective (HCC)   . OCD (obsessive compulsive disorder)    History reviewed. No pertinent surgical history. Family History:  Family History  Problem Relation Age of Onset  . Drug abuse Father   . Mental illness Other    Family Psychiatric  History: See H&P Social History:  Social History   Substance and Sexual Activity  Alcohol Use Yes   Comment: socially     Social History   Substance and Sexual Activity  Drug Use Yes  . Types: Marijuana   Comment: occasionally    Social History   Socioeconomic History  . Marital status: Single    Spouse name: None  . Number of children: None  . Years of education: None  . Highest education level: None  Social Needs  . Financial resource strain: None  . Food insecurity - worry: None  . Food insecurity - inability: None  . Transportation needs - medical: None  . Transportation needs -  non-medical: None  Occupational History  . None  Tobacco Use  . Smoking status: Current Every Day Smoker    Packs/day: 1.00    Years: 1.00    Pack years: 1.00    Types: Cigarettes  . Smokeless tobacco: Never Used  Substance and Sexual Activity  . Alcohol use: Yes    Comment: socially  . Drug use: Yes    Types: Marijuana    Comment: occasionally  . Sexual activity: No    Birth control/protection: None  Other Topics Concern  . None   Social History Narrative  . None   Additional Social History:   Sleep: Good  Appetite:  Good  Current Medications: Current Facility-Administered Medications  Medication Dose Route Frequency Provider Last Rate Last Dose  . acetaminophen (TYLENOL) tablet 650 mg  650 mg Oral Q6H PRN Laveda AbbeParks, Laurie Britton, NP   650 mg at 02/11/17 2338  . alum & mag hydroxide-simeth (MAALOX/MYLANTA) 200-200-20 MG/5ML suspension 30 mL  30 mL Oral Q4H PRN Laveda AbbeParks, Laurie Britton, NP   30 mL at 02/09/17 0753  . asenapine (SAPHRIS) sublingual tablet 5 mg  5 mg Sublingual BID Micheal Likensainville, Christopher T, MD   5 mg at 02/13/17 0857  . benztropine (COGENTIN) tablet 1 mg  1 mg Oral QHS Laveda AbbeParks, Laurie Britton, NP   1 mg at 02/12/17 2116  . lithium carbonate (ESKALITH) CR tablet 450 mg  450 mg Oral Q12H Micheal Likensainville, Christopher T, MD   450 mg at 02/13/17 0858  . LORazepam (ATIVAN) tablet 1 mg  1 mg Oral Q8H PRN Micheal Likensainville, Christopher T, MD   1 mg at 02/12/17 2111  . magnesium hydroxide (MILK OF MAGNESIA) suspension 30 mL  30 mL Oral Daily PRN Laveda AbbeParks, Laurie Britton, NP      . nicotine polacrilex (NICORETTE) gum 2 mg  2 mg Oral PRN Micheal Likensainville, Christopher T, MD      . OLANZapine zydis (ZYPREXA) disintegrating tablet 10 mg  10 mg Oral Q8H PRN Micheal Likensainville, Christopher T, MD   10 mg at 02/09/17 2353   Or  . ziprasidone (GEODON) injection 20 mg  20 mg Intramuscular Q12H PRN Micheal Likensainville, Christopher T, MD   20 mg at 02/09/17 1439  . ondansetron (ZOFRAN) tablet 4 mg  4 mg Oral Q8H PRN Laveda AbbeParks, Laurie Britton, NP      . traZODone (DESYREL) tablet 100 mg  100 mg Oral QHS Laveda AbbeParks, Laurie Britton, NP   100 mg at 02/12/17 2111   Lab Results:  Results for orders placed or performed during the hospital encounter of 02/08/17 (from the past 48 hour(s))  Lithium level     Status: None   Collection Time: 02/13/17  6:28 AM  Result Value Ref Range   Lithium Lvl 0.66 0.60 - 1.20 mmol/L    Comment: Performed at [facility], [address] [place], [place] [address]   Blood Alcohol level:  Lab Results  Component Value Date   [facility] <10 02/06/2017   ETH <5 02/18/2016   Metabolic Disorder Labs: Lab Results  Component Value Date   HGBA1C 6.5 (H) 09/30/2014   MPG 140 09/30/2014   MPG 134 (H) 12/26/2012   Lab Results  Component Value Date   PROLACTIN 150.0 (H) 08/22/2014   Lab Results  Component Value Date   CHOL 137 09/30/2014   TRIG 98 09/30/2014   HDL 37 (L) 09/30/2014   CHOLHDL 3.7 09/30/2014   VLDL 20 09/30/2014   LDLCALC 80 09/30/2014   Physical Findings:  AIMS: Facial and Oral Movements Muscles of Facial Expression: None, normal Lips and Perioral Area: None, normal Jaw: None, normal Tongue: None, normal,Extremity Movements Upper (arms, wrists, hands, fingers): None, normal Lower (legs, knees, ankles, toes): None, normal, Trunk Movements Neck, shoulders, hips: None, normal, Overall Severity Severity of abnormal movements (highest score from questions above): None, normal Incapacitation due to abnormal movements: None, normal Patient's awareness of abnormal movements (rate only patient's report): No Awareness, Dental Status Current problems with teeth and/or dentures?: No Does patient usually wear dentures?: No  CIWA:    COWS:     Musculoskeletal: Strength & Muscle Tone: within normal limits Gait & Station: normal Patient leans: N/A  Psychiatric Specialty Exam: Physical Exam  Nursing note and vitals reviewed.   Review of Systems  Constitutional: Negative for chills and fever.  Respiratory: Negative for cough and shortness of breath.   Cardiovascular: Negative for chest pain.  Gastrointestinal: Negative for heartburn and nausea.  Psychiatric/Behavioral: Negative for depression, hallucinations and suicidal ideas. The patient is nervous/anxious.     Blood pressure 115/66, pulse (!) 109, temperature 97.6 F (36.4 C), temperature source Oral, resp. rate 20, height 5\' 3"  (1.6 m), weight 79.8  kg (176 lb), SpO2 100 %.Body mass index is 31.18 kg/m.  General Appearance: Casual and Fairly Groomed  Eye Contact:  Good  Speech:  Clear and Coherent and Normal Rate  Volume:  Normal  Mood:  Euthymic  Affect:  Appropriate and Congruent  Thought Process:  Coherent and Goal Directed  Orientation:  Full (Time, Place, and Person)  Thought Content:  Logical  Suicidal Thoughts:  Denies  Homicidal Thoughts:  Denies  Memory:  Immediate;   Fair Recent;   Fair Remote;   Fair  Judgement:  Impaired  Insight:  Lacking  Psychomotor Activity:  Normal  Concentration:  Concentration: Fair  Recall:  FiservFair  Fund of Knowledge:  Fair  Language:  Fair  Akathisia:  No  Handed:    AIMS (if indicated):     Assets:  Manufacturing systems engineerCommunication Skills Physical Health Resilience Social Support  ADL's:  Intact  Cognition:  WNL  Sleep:  Number of Hours: 5.75   Treatment Plan Summary: Daily contact with patient to assess and evaluate symptoms and progress in treatment and Medication management. Pt presents with improved symptoms of mania including irritability, distractibility, and flight of ideas. She presents calmer, more reasonable & goal oriented. She is looking forward to getting discharge.  - Continue inpatient level of care  Will continue today 02/13/2017 plan as below except where it is noted.  - Labs:  lithium level 0.22 on 02/09/17, next level on Monday 02/13/17, result pending.  - Schizoaffective bipolar type - Continue lithium 450mg  BID - Continue saphris 5mg  BID - Insomnia - Continue trazodone 50 mg qhs prn insomnia - Anxiety - Continue ativan 1mg  q8h prn anxiety  -Encourage participation in groups and therapeutic milieu -Discharge planning will be ongoing  Sanjuana KavaNwoko, Agnes I, NP, PMHNP, FNP-BC. 02/13/2017, 11:21 AMPatient ID: Barbara Senterhanda D Poster, female   DOB: [birth year], 41 y.o.   MRN: [id number]

## 2017-02-14 NOTE — Progress Notes (Signed)
Patient denies SI, HI and AVH.  Patient has been actively engaged in groups and unit activities and has had no incidents of behavioral dsycontrol.   Assess patient for safety offer medications as prescribed, engage patient in 1:1 staff talks.   Patient able to contract for safety. Continue to monitor as prescribed.  

## 2017-02-14 NOTE — Plan of Care (Signed)
  Activity: Sleeping patterns will improve 02/14/2017 2201 - Progressing by Delos HaringPhillips, Cleva Camero A, RN Note Pt slept over 5 hrs last night   Coping: Ability to demonstrate self-control will improve 02/14/2017 2201 - Progressing by Delos HaringPhillips, Jonah Nestle A, RN Note Pt has been calm on the unit this evening   Safety: Ability to redirect hostility and anger into socially appropriate behaviors will improve 02/14/2017 2201 - Progressing by Delos HaringPhillips, Jae Bruck A, RN Note Pt safe on the unit this evening

## 2017-02-14 NOTE — Progress Notes (Signed)
Adult Psychoeducational Group Note  Date:  02/14/2017 Time:  10:12 PM  Group Topic/Focus:  Wrap-Up Group:   The focus of this group is to help patients review their daily goal of treatment and discuss progress on daily workbooks.  Participation Level:  Active  Participation Quality:  Appropriate  Affect:  Appropriate  Cognitive:  Appropriate  Insight: Appropriate  Engagement in Group:  Engaged  Modes of Intervention:  Discussion  Additional Comments:  The patient expressed that she rates today a 8.The patient also said that she attended all groups.  Octavio Mannshigpen, Kal Chait Lee 02/14/2017, 10:12 PM

## 2017-02-14 NOTE — Tx Team (Signed)
Interdisciplinary Treatment and Diagnostic Plan Update  02/14/2017 Time of Session: 10:01 AM  [name] MRN: [id number]  Principal Diagnosis: Schizoaffective disorder, bipolar type ([place])  Secondary Diagnoses: Principal Problem:   Schizoaffective disorder, bipolar type ([place])   Current Medications:  Current Facility-Administered Medications  Medication Dose Route Frequency Provider Last Rate Last Dose  . acetaminophen (TYLENOL) tablet 650 mg  650 mg Oral Q6H PRN Ethelene Hal, NP   650 mg at 02/11/17 2338  . alum & mag hydroxide-simeth (MAALOX/MYLANTA) 200-200-20 MG/5ML suspension 30 mL  30 mL Oral Q4H PRN Ethelene Hal, NP   30 mL at 02/09/17 0753  . asenapine (SAPHRIS) sublingual tablet 5 mg  5 mg Sublingual BID Pennelope Bracken, MD   5 mg at 02/14/17 0816  . benztropine (COGENTIN) tablet 1 mg  1 mg Oral QHS Ethelene Hal, NP   1 mg at 02/13/17 2100  . lithium carbonate (ESKALITH) CR tablet 450 mg  450 mg Oral Q12H Pennelope Bracken, MD   450 mg at 02/14/17 0815  . LORazepam (ATIVAN) tablet 1 mg  1 mg Oral Q8H PRN Pennelope Bracken, MD   1 mg at 02/13/17 2100  . magnesium hydroxide (MILK OF MAGNESIA) suspension 30 mL  30 mL Oral Daily PRN Ethelene Hal, NP      . nicotine polacrilex (NICORETTE) gum 2 mg  2 mg Oral PRN Pennelope Bracken, MD      . OLANZapine zydis (ZYPREXA) disintegrating tablet 10 mg  10 mg Oral Q8H PRN Pennelope Bracken, MD   10 mg at 02/09/17 2353   Or  . ziprasidone (GEODON) injection 20 mg  20 mg Intramuscular Q12H PRN Pennelope Bracken, MD   20 mg at 02/09/17 1439  . ondansetron (ZOFRAN) tablet 4 mg  4 mg Oral Q8H PRN Ethelene Hal, NP      . traZODone (DESYREL) tablet 100 mg  100 mg Oral QHS Ethelene Hal, NP   100 mg at 02/13/17 2100    PTA Medications: Medications Prior to Admission  Medication Sig Dispense Refill Last Dose  . benztropine (COGENTIN) 1 MG tablet  Take 1 mg by mouth at bedtime.   Past Month at Unknown time  . lithium carbonate 300 MG capsule Take 300 mg by mouth 2 (two) times daily.   Past Month at Unknown time  . LORazepam (ATIVAN) 1 MG tablet Take 1 mg by mouth 2 (two) times daily.   Past Month at Unknown time  . risperidone (RISPERDAL) 4 MG tablet Take 4 mg by mouth 2 (two) times daily.   Past Month at Unknown time  . traZODone (DESYREL) 100 MG tablet Take 100 mg by mouth at bedtime.   Past Month at Unknown time    Patient Stressors: Medication change or noncompliance Other: Chronic mental illness  Patient Strengths: Curator fund of knowledge Physical Health  Treatment Modalities: Medication Management, Group therapy, Case management,  1 to 1 session with clinician, Psychoeducation, Recreational therapy.   Physician Treatment Plan for Primary Diagnosis: Schizoaffective disorder, bipolar type ([place]) Long Term Goal(s): Improvement in symptoms so as ready for discharge  Short Term Goals: Ability to identify changes in lifestyle to reduce recurrence of condition will improve Ability to identify and develop effective coping behaviors will improve  Medication Management: Evaluate patient's response, side effects, and tolerance of medication regimen.  Therapeutic Interventions: 1 to 1 sessions, Unit Group sessions and Medication administration.  Evaluation of Outcomes:  Adequate for Discharge  Physician Treatment Plan for Secondary Diagnosis: Principal Problem:   Schizoaffective disorder, bipolar type ([place])   Long Term Goal(s): Improvement in symptoms so as ready for discharge  Short Term Goals: Ability to identify changes in lifestyle to reduce recurrence of condition will improve Ability to identify and develop effective coping behaviors will improve  Medication Management: Evaluate patient's response, side effects, and tolerance of medication regimen.  Therapeutic Interventions: 1 to 1 sessions, Unit  Group sessions and Medication administration.  Evaluation of Outcomes: Adequate for Discharge   12/11: She presents calmer, more reasonable & goal oriented. She is looking forward to getting discharge.  -Continue inpatient level of care  Will continue today 02/13/2017 plan as below except where it is noted.  - Labs: lithium level 0.22 on 02/09/17, next level on Monday 02/13/17, result pending.  - Schizoaffective bipolar type -Continuelithium 449m BID -Continue saphris 5858mBID - Insomnia -Continuetrazodone 50 mg qhs prn insomnia - Anxiety -Continueativan 58m74m8h prn anxiety     RN Treatment Plan for Primary Diagnosis: Schizoaffective disorder, bipolar type ([facility]CC[place] Term Goal(s): Knowledge of disease and therapeutic regimen to maintain health will improve  Short Term Goals: Ability to identify and develop effective coping behaviors will improve and Compliance with prescribed medications will improve  Medication Management: RN will administer medications as ordered by provider, will assess and evaluate patient's response and provide education to patient for prescribed medication. RN will report any adverse and/or side effects to prescribing provider.  Therapeutic Interventions: 1 on 1 counseling sessions, Psychoeducation, Medication administration, Evaluate responses to treatment, Monitor vital signs and CBGs as ordered, Perform/monitor CIWA, COWS, AIMS and Fall Risk screenings as ordered, Perform wound care treatments as ordered.  Evaluation of Outcomes: Adequate for Discharge   LCSW Treatment Plan for Primary Diagnosis: Schizoaffective disorder, bipolar type ([place] Term Goal(s): Safe transition to appropriate next level of care at discharge, Engage patient in therapeutic group addressing interpersonal concerns.  Short Term Goals: Engage patient in aftercare planning with referrals and resources  Therapeutic  Interventions: Assess for all discharge needs, 1 to 1 time with Social worker, Explore available resources and support systems, Assess for adequacy in community support network, Educate family and significant other(s) on suicide prevention, Complete Psychosocial Assessment, Interpersonal group therapy.  Evaluation of Outcomes: Met  Return home with mother, follow up [facility]   Progress in Treatment: Attending groups: Yes Participating in groups: Yes Taking medication as prescribed: Yes Toleration medication: Yes, no side effects reported at this time Family/Significant other contact made: No Patient understands diagnosis: Limited insight Discussing patient identified problems/goals with staff: Yes Medical problems stabilized or resolved: Yes Denies suicidal/homicidal ideation: Yes Issues/concerns per patient self-inventory: None Other: N/A  New problem(s) identified: None identified at this time.   New Short Term/Long Term Goal(s): "Help me with minding my business"   Discharge Plan or Barriers:   Reason for Continuation of Hospitalization:  Medication stabilization   Estimated Length of Stay: [place] d/c tomorrow  Attendees: Patient:  02/14/2017  10:01 AM  Physician: ChrMaris BergerD 02/14/2017  10:01 AM  Nursing: EliSena HitchN 02/14/2017  10:01 AM  RN Care Manager: JenLars PinksN 02/14/2017  10:01 AM  Social Worker: RodRipley Fraise/01/2017  10:01 AM  Recreational Therapist: MarWinfield Cunas/01/2017  10:01 AM  Other: DelNorberto Sorenson/01/2017  10:01 AM  Other:  02/14/2017  10:01 AM    Scribe for Treatment Team:  RodRoque LiasSW 02/14/2017 10:01 AM

## 2017-02-14 NOTE — Progress Notes (Signed)
[facility] MD Progress Note  02/14/2017 11:41 AM Barbara [name]  MRN:  [id number]   Subjective: Barbara [name] reports, "[name]'m doing okay. The doctor told me that [name] will be going home tomorrow.The medications are helping me a lot. My mother is in agreement".  Objective: Barbara [name] is a 41 y/o F with history of schizoaffective disorder bipolar type who was admitted on IVC placed by her mother with concern for worsening symptoms of mania and threats of violence to her family. Pt presented initially as irritable and agitated upon evaluation in ED, and she continued to be irritable once admitted to [facility]. She agreed to be restarted on lithium and she also was accepting of trial of saphris. Pt had some ongoing agitation and intrusive behaviors which resulted in being placed on 1:1 with staff.   Today 02-14-17, upon evaluation, pt presents more calm & logical. She says he is doing well. Denies any new issues. Says the doctor had told her that she is getting discharged tomorrow. She notes that she is tolerating her medications without difficulty or side effects. She is sleeping well and her appetite is good. Discussed with patient about her treatment plan. She agrees to continue her current regimen without changes at this time. Her Lithium dose outside the hospital was confirmed as Lithium ER 300mg  BID and her level drawn on 02/09/17 was 0.22. She was restarted at higher dose of Lithium 450mg  BID & rechecked lithium level on 02/13/17, results within therapeutic 0.66. She had no further questions, comments, or concerns. She does not appear to be responding to any internal stimuli.   Principal Problem: Schizoaffective disorder, [name]  Diagnosis:   Patient Active Problem List   Diagnosis Date Noted  . Schizoaffective disorder, [name] [F25.0] 02/08/2017  . Adjustment disorder with disturbance of emotion [F43.29] 12/13/2016  . Agitation [R45.1]   . Mood disorder (HCC) [F39]   . Affective  psychosis, bipolar (HCC) [F31.9]   . Bipolar affective disorder, currently manic, severe, with psychotic features (HCC) [F31.2] 09/24/2014  . Homicidal ideation [R45.850]   . Bipolar disorder, current episode manic w/o psychotic features, severe (HCC) [F31.13] 08/21/2014  . Cannabis use disorder, moderate, dependence (HCC) [F12.20] 08/21/2014  . Tobacco use disorder [F17.200] 08/21/2014   Total Time spent with patient: 15 minutes  Past Psychiatric History: See H&P  Past Medical History:  Past Medical History:  Diagnosis Date  . Anxiety   . Bipolar affective (HCC)   . OCD (obsessive compulsive disorder)    History reviewed. No pertinent surgical history. Family History:  Family History  Problem Relation Age of Onset  . Drug abuse Father   . Mental illness Other    Family Psychiatric  History: See H&P Social History:  Social History   Substance and Sexual Activity  Alcohol Use Yes   Comment: socially     Social History   Substance and Sexual Activity  Drug Use Yes  . Types: Marijuana   Comment: occasionally    Social History   Socioeconomic History  . Marital status: Single    Spouse name: None  . Number of children: None  . Years of education: None  . Highest education level: None  Social Needs  . Financial resource strain: None  . Food insecurity - worry: None  . Food insecurity - inability: None  . Transportation needs - medical: None  . Transportation needs - non-medical: None  Occupational History  . None  Tobacco Use  . Smoking status: Current  Every Day Smoker    Packs/day: 1.00    Years: 1.00    Pack years: 1.00    Types: Cigarettes  . Smokeless tobacco: Never Used  Substance and Sexual Activity  . Alcohol use: Yes    Comment: socially  . Drug use: Yes    Types: Marijuana    Comment: occasionally  . Sexual activity: No    Birth control/protection: None  Other Topics Concern  . None  Social History Narrative  . None   Additional Social  History:   Sleep: Good  Appetite:  Good  Current Medications: Current Facility-Administered Medications  Medication Dose Route Frequency Provider Last Rate Last Dose  . acetaminophen (TYLENOL) tablet 650 mg  650 mg Oral Q6H PRN Laveda AbbeParks, Laurie Britton, NP   650 mg at 02/11/17 2338  . alum & mag hydroxide-simeth (MAALOX/MYLANTA) 200-200-20 MG/5ML suspension 30 mL  30 mL Oral Q4H PRN Laveda AbbeParks, Laurie Britton, NP   30 mL at 02/09/17 0753  . asenapine (SAPHRIS) sublingual tablet 5 mg  5 mg Sublingual BID Micheal Likensainville, Christopher T, MD   5 mg at 02/14/17 0816  . benztropine (COGENTIN) tablet 1 mg  1 mg Oral QHS Laveda AbbeParks, Laurie Britton, NP   1 mg at 02/13/17 2100  . lithium carbonate (ESKALITH) CR tablet 450 mg  450 mg Oral Q12H Micheal Likensainville, Christopher T, MD   450 mg at 02/14/17 0815  . LORazepam (ATIVAN) tablet 1 mg  1 mg Oral Q8H PRN Micheal Likensainville, Christopher T, MD   1 mg at 02/13/17 2100  . magnesium hydroxide (MILK OF MAGNESIA) suspension 30 mL  30 mL Oral Daily PRN Laveda AbbeParks, Laurie Britton, NP      . nicotine polacrilex (NICORETTE) gum 2 mg  2 mg Oral PRN Micheal Likensainville, Christopher T, MD      . OLANZapine zydis (ZYPREXA) disintegrating tablet 10 mg  10 mg Oral Q8H PRN Micheal Likensainville, Christopher T, MD   10 mg at 02/09/17 2353   Or  . ziprasidone (GEODON) injection 20 mg  20 mg Intramuscular Q12H PRN Micheal Likensainville, Christopher T, MD   20 mg at 02/09/17 1439  . ondansetron (ZOFRAN) tablet 4 mg  4 mg Oral Q8H PRN Laveda AbbeParks, Laurie Britton, NP      . traZODone (DESYREL) tablet 100 mg  100 mg Oral QHS Laveda AbbeParks, Laurie Britton, NP   100 mg at 02/13/17 2100   Lab Results:  Results for orders placed or performed during the hospital encounter of 02/08/17 (from the past 48 hour(s))  Lithium level     Status: None   Collection Time: 02/13/17  6:28 AM  Result Value Ref Range   Lithium Lvl 0.66 0.60 - 1.20 mmol/L    Comment: [facility]formed at [facility], [address] [place], [place] [address]   Blood Alcohol level:   Lab Results  Component Value Date   [facility] <10 02/06/2017   ETH <5 02/18/2016   Metabolic Disorder Labs: Lab Results  Component Value Date   HGBA1C 6.5 (H) 09/30/2014   MPG 140 09/30/2014   MPG 134 (H) 12/26/2012   Lab Results  Component Value Date   PROLACTIN 150.0 (H) 08/22/2014   Lab Results  Component Value Date   CHOL 137 09/30/2014   TRIG 98 09/30/2014   HDL 37 (L) 09/30/2014   CHOLHDL 3.7 09/30/2014   VLDL 20 09/30/2014   LDLCALC 80 09/30/2014   Physical Findings: AIMS: Facial and Oral Movements Muscles of Facial Expression: None, normal Lips and Perioral Area: None,  normal Jaw: None, normal Tongue: None, normal,Extremity Movements Upper (arms, wrists, hands, fingers): None, normal Lower (legs, knees, ankles, toes): None, normal, Trunk Movements Neck, shoulders, hips: None, normal, Overall Severity Severity of abnormal movements (highest score from questions above): None, normal Incapacitation due to abnormal movements: None, normal Patient's awareness of abnormal movements (rate only patient's report): No Awareness, Dental Status Current problems with teeth and/or dentures?: No Does patient usually wear dentures?: No  CIWA:    COWS:     Musculoskeletal: Strength & Muscle Tone: within normal limits Gait & Station: normal Patient leans: N/A  Psychiatric Specialty Exam: Physical Exam  Nursing note and vitals reviewed.   Review of Systems  Constitutional: Negative for chills and fever.  Respiratory: Negative for cough and shortness of breath.   Cardiovascular: Negative for chest pain.  Gastrointestinal: Negative for heartburn and nausea.  Psychiatric/Behavioral: Negative for depression, hallucinations and suicidal ideas. The patient is nervous/anxious.     Blood pressure 116/62, pulse (!) 112, temperature 98.1 F (36.7 C), temperature source Oral, resp. rate 18, height 5\' 3"  (1.6 m), weight 79.8 kg (176 lb), SpO2 100 %.Body mass index is 31.18 kg/m.   General Appearance: Casual and Fairly Groomed  Eye Contact:  Good  Speech:  Clear and Coherent and Normal Rate  Volume:  Normal  Mood:  Euthymic  Affect:  Appropriate and Congruent  Thought Process:  Coherent and Goal Directed  Orientation:  Full (Time, Place, and Person)  Thought Content:  Logical  Suicidal Thoughts:  Denies  Homicidal Thoughts:  Denies  Memory:  Immediate;   Fair Recent;   Fair Remote;   Fair  Judgement:  Impaired  Insight:  Lacking  Psychomotor Activity:  Normal  Concentration:  Concentration: Fair  Recall:  FiservFair  Fund of Knowledge:  Fair  Language:  Fair  Akathisia:  No  Handed:    AIMS (if indicated):     Assets:  Manufacturing systems engineerCommunication Skills Physical Health Resilience Social Support  ADL's:  Intact  Cognition:  WNL  Sleep:  Number of Hours: 6.25   Treatment Plan Summary: Daily contact with patient to assess and evaluate symptoms and progress in treatment and Medication management. Pt continues to present calmer, more reasonable & goal oriented. She is looking forward to getting discharged. She denies any new issues.   - Continue inpatient level of care  Will continue today 02/14/2017 plan as below except where it is noted.  - Labs:  lithium level 0.22 on 02/09/17,  level drawn on Monday 02/13/17, result within therapeutic range at 0.66.  - Schizoaffective bipolar type - Continue lithium 450mg  BID - Continue saphris 5mg  BID - Insomnia - Continue trazodone 50 mg qhs prn insomnia - Anxiety - Continue ativan 1mg  q8h prn anxiety  -Encourage participation in groups and therapeutic milieu -Discharge planning will be ongoing  Barbara [name], Barbara I, NP, PMHNP, FNP-BC. 02/14/2017, 11:41 AMPatient ID: Barbara [name], female   DOB: [birth year], 41 y.o.   MRN: [id number]

## 2017-02-14 NOTE — Progress Notes (Signed)
D: Pt denies SI/HI/AVH. Pt is pleasant and cooperative. Pt stated she had a good day, pt has been calm on the unit this evening.   A: Pt was offered support and encouragement. Pt was given scheduled medications. Pt was encourage to attend groups. Q 15 minute checks were done for safety.   R:Pt attends groups and interacts well with peers and staff. Pt is taking medication. Pt has no complaints.Pt receptive to treatment and safety maintained on unit.

## 2017-02-15 MED ORDER — LITHIUM CARBONATE ER 450 MG PO TBCR: 450.0000 mg | EXTENDED_RELEASE_TABLET | Freq: Two times a day (BID) | ORAL | 0 refills | Status: DC

## 2017-02-15 MED ORDER — LORAZEPAM 1 MG PO TABS: 1.0000 mg | ORAL_TABLET | Freq: Two times a day (BID) | ORAL | 0 refills | Status: DC

## 2017-02-15 MED ORDER — BENZTROPINE MESYLATE 1 MG PO TABS: 1.0000 mg | ORAL_TABLET | Freq: Every day | ORAL | 0 refills | Status: DC

## 2017-02-15 MED ORDER — ASENAPINE MALEATE 5 MG SL SUBL: 5.0000 mg | SUBLINGUAL_TABLET | Freq: Two times a day (BID) | SUBLINGUAL | 0 refills | Status: DC

## 2017-02-15 MED ORDER — TRAZODONE HCL 100 MG PO TABS: 100.0000 mg | ORAL_TABLET | Freq: Every day | ORAL | 0 refills | Status: DC

## 2017-02-15 MED ORDER — NICOTINE POLACRILEX 2 MG MT GUM: 2.0000 mg | CHEWING_GUM | OROMUCOSAL | 0 refills | Status: DC | PRN

## 2017-02-15 NOTE — [facility] Suicide Risk Assessment (Signed)
[facility] INPATIENT:  Family/Significant Other Suicide Prevention Education  Suicide Prevention Education:  Contact Attempts: [name] [phone] has been identified by the patient as the family member/significant other with whom the patient will be residing, and identified as the person(s) who will aid the patient in the event of a mental health crisis.  With written consent from the patient, two attempts were made to provide suicide prevention education, prior to and/or following the patient's discharge.  We were unsuccessful in providing suicide prevention education.  A suicide education pamphlet was given to the patient to share with family/significant other.  Date and time of first attempt:02/14/17  7:41 PM Date and time of second attempt:02/15/2017 12:24 PM   [name] 02/15/2017, 12:22 PM

## 2017-02-15 NOTE — Progress Notes (Signed)
  [facility] :  Will you be returning to the same living situation after discharge:  Yes,  home At discharge, do you have transportation home?: Yes,  says she will call a cab Do you have the ability to pay for your medications: Yes,  MCD  Release of information consent forms completed and in the chart;  Patient's signature needed at discharge.  Patient to Follow up at: Follow-up Information    [facility] Follow up.   Why:  Call them the day of d/c to confirm when Court will be by to see you again. Contact information: [address] [place] [address] [phone]           Next level of care provider has access to [facility] Link:no  Safety Planning and Suicide Prevention discussed: Yes,  yes  Have you used any form of tobacco in the last 30 days? (Cigarettes, Smokeless Tobacco, Cigars, and/or Pipes): Yes  Has patient been referred to the Quitline?: Patient refused referral  Patient has been referred for addiction treatment: N/A  Ida RogueRodney B Ellajane Stong, LCSW 02/15/2017, 8:57 AM

## 2017-02-15 NOTE — Discharge Summary (Signed)
Physician Discharge Summary Note  Patient:  Teressa SenterChanda D Boven is an 41 y.o., female  MRN:  [id number]  DOB:  [birth year]  Patient phone:  [phone] (home)   Patient address:   [address] [place] [address],   Total Time spent with patient: Greater than 30 minutes  Date of Admission:  02/08/2017  Date of Discharge: 02/15/2017  Reason for Admission:    Principal Problem: Schizoaffective disorder, bipolar type [facility](HCC)  Discharge Diagnoses: Patient Active Problem List   Diagnosis Date Noted  . Schizoaffective disorder, [name] [F25.0] 02/08/2017  . Adjustment disorder with disturbance of emotion [F43.29] 12/13/2016  . Agitation [R45.1]   . Mood disorder (HCC) [F39]   . Affective psychosis, bipolar (HCC) [F31.9]   . Bipolar affective disorder, currently manic, severe, with psychotic features (HCC) [F31.2] 09/24/2014  . Homicidal ideation [R45.850]   . Bipolar disorder, current episode manic w/o psychotic features, severe (HCC) [F31.13] 08/21/2014  . Cannabis use disorder, moderate, dependence (HCC) [F12.20] 08/21/2014  . Tobacco use disorder [F17.200] 08/21/2014   Musculoskeletal: Strength & Muscle Tone: within normal limits Gait & Station: normal Patient leans: N/A  Psychiatric Specialty Exam:  SEE PSE within the MD SRA Physical Exam  Vitals reviewed. HENT:  Head: Normocephalic.  Eyes: Pupils are equal, round, and reactive to light.  Cardiovascular: Normal rate.  Respiratory: Effort normal.  GI: Soft.  Genitourinary:  Genitourinary Comments: Deferred  Musculoskeletal: Normal range of motion.  Neurological: She is alert.  Skin: Skin is warm.    Review of Systems  Constitutional: Negative.   HENT: Negative.   Eyes: Negative.   Respiratory: Negative.   Cardiovascular: Negative.   Gastrointestinal: Negative.   Genitourinary: Negative.   Musculoskeletal: Negative.   Skin: Negative.   Neurological: Negative.   Endo/Heme/Allergies:  Negative.   Psychiatric/Behavioral: Positive for depression ( Stabilized with medication prior to discharge) and substance abuse (Hx. Benzodiazepine & THC use disorder). Negative for hallucinations, memory loss and suicidal ideas. The patient is nervous/anxious and has insomnia (Stabilized with medication prior to discharge).   All other systems reviewed and are negative.   Blood pressure 111/68, pulse (!) 104, temperature 98.7 F (37.1 C), resp. rate 16, height 5\' 3"  (1.6 m), weight 79.8 kg (176 lb), SpO2 100 %.Body mass index is 31.18 kg/m.  Have you used any form of tobacco in the last 30 days? (Cigarettes, Smokeless Tobacco, Cigars, and/or Pipes): Yes   Has this patient used any form of tobacco in the last 30 days? (Cigarettes, Smokeless Tobacco, Cigars, and/or Pipes) N/A  Past Medical History:  Past Medical History:  Diagnosis Date  . Anxiety   . Bipolar affective (HCC)   . OCD (obsessive compulsive disorder)    History reviewed. No pertinent surgical history.  Family History:  Family History  Problem Relation Age of Onset  . Drug abuse Father   . Mental illness Other    Social History:  Social History   Substance and Sexual Activity  Alcohol Use Yes   Comment: socially     Social History   Substance and Sexual Activity  Drug Use Yes  . Types: Marijuana   Comment: occasionally    Social History   Socioeconomic History  . Marital status: Single    Spouse name: None  . Number of children: None  . Years of education: None  . Highest education level: None  Social Needs  . Financial resource strain: None  . Food insecurity - worry: None  . Food  insecurity - inability: None  . Transportation needs - medical: None  . Transportation needs - non-medical: None  Occupational History  . None  Tobacco Use  . Smoking status: Current Every Day Smoker    Packs/day: 1.00    Years: 1.00    Pack years: 1.00    Types: Cigarettes  . Smokeless tobacco: Never Used   Substance and Sexual Activity  . Alcohol use: Yes    Comment: socially  . Drug use: Yes    Types: Marijuana    Comment: occasionally  . Sexual activity: No    Birth control/protection: None  Other Topics Concern  . None  Social History Narrative  . None   Hospital Course: Dawanna Grauberger is a 41 y/o F with history of schizoaffective disorder bipolar type who was admitted on IVC placed by her mother with concern for worsening symptoms of mania and threatsof violenceto her family. Shewas restarted on lithium and she started trial of saphris. Pt had some ongoing agitation and intrusive behaviors which resulted in being placed on1:1 with staff, but with time to stabilize on the inpatient unit, pt had improvement of her behaviors and initial presenting symptoms.  After evaluation of her presenting symptoms, Raeven's presenting symptoms were noted. The medication regimen targeting those symptoms were discussed & initiated with her consent. She received & was discharged on Saphris 5 mg bid for mood control, Cogentin 1 mg bid for EPS, Lithium Carbonate CR 450 mg for mood stabilization, Nicorette gum 2 mg for smoking cessation & Trazodone 100 mg for insomnia. She was enrolled & participated in the group counseling sessions being offered & held on this unit. She learned coping. She presented no other significant health issues that required treatment & or monitoring. She tolerated her treatment regimen without any adverse effects reported.  Briony's symptoms responded well to her treatment regimen. This is evidenced by her reports of improved, absence of agitation & presentation of good affects. She is currently medically & psychiatrically stable for discharge to continue mental health care & medication management on an outpatient basis.   Today upon her discharge evaluation, Ravleen reports she is doing well overall. She is calm, pleasant, and cooperative. She denies SI/HI/AH/VH. She is sleeping well  and her appetite is good. She is in agreement to continue on her current regimen and to have follow up with her ACT team. She shares that her mother is still snowed in and cannot leave the driveway, so her plan is to take a taxi back to her home. Pt was able to engage in safety planning including plan to return to [facility] or contact emergency services if she feels unable to maintain her own safety or the safety of others.   Chand left [facility] with all personal belongings in no apparent. [facility] assisted with the cab fare.  Discharge Vitals:   Blood pressure 111/68, pulse (!) 104, temperature 98.7 F (37.1 C), resp. rate 16, height 5\' 3"  (1.6 m), weight 79.8 kg (176 lb), SpO2 100 %. Body mass index is 31.18 kg/m.  Lab Results:   Results for orders placed or performed during the hospital encounter of 02/08/17 (from the past 72 hour(s))  Lithium level     Status: None   Collection Time: 02/13/17  6:28 AM  Result Value Ref Range   Lithium Lvl 0.66 0.60 - 1.20 mmol/L    Comment: Performed at [facility], [address] [place], [place] [address]   Physical Findings:  AIMS: Facial and  Oral Movements Muscles of Facial Expression: None, normal Lips and Perioral Area: None, normal Jaw: None, normal Tongue: None, normal,Extremity Movements Upper (arms, wrists, hands, fingers): None, normal Lower (legs, knees, ankles, toes): None, normal, Trunk Movements Neck, shoulders, hips: None, normal, Overall Severity Severity of abnormal movements (highest score from questions above): None, normal Incapacitation due to abnormal movements: None, normal Patient's awareness of abnormal movements (rate only patient's report): No Awareness, Dental Status Current problems with teeth and/or dentures?: No Does patient usually wear dentures?: No  CIWA:    COWS:     See Psychiatric Specialty Exam and Suicide Risk Assessment completed by Attending Physician prior to discharge.  Discharge destination:   Home  Is patient on multiple antipsychotic therapies at discharge:  No   Has Patient had three or more failed trials of antipsychotic monotherapy by history:  No  Recommended Plan for Multiple Antipsychotic Therapies: NA  Allergies as of 02/15/2017      Reactions   Haldol [haloperidol] Other (See Comments)   Reaction:  Lock-jaw       Medication List    STOP taking these medications   lithium carbonate 300 MG capsule Replaced by:  lithium carbonate 450 MG CR tablet   RISPERDAL 4 MG tablet Generic drug:  risperidone     TAKE these medications     Indication  asenapine 5 MG Subl 24 hr tablet Commonly known as:  SAPHRIS Place 1 tablet (5 mg total) under the tongue 2 (two) times daily. For mood control  Indication:  Mood control   benztropine 1 MG tablet Commonly known as:  COGENTIN Take 1 tablet (1 mg total) by mouth at bedtime. For prevention of drug induced tremors What changed:  additional instructions  Indication:  Extrapyramidal Reaction caused by Medications   lithium carbonate 450 MG CR tablet Commonly known as:  ESKALITH Take 1 tablet (450 mg total) by mouth every 12 (twelve) hours. For mood stabilization Replaces:  lithium carbonate 300 MG capsule  Indication:  Mood stabilization   LORazepam 1 MG tablet Commonly known as:  ATIVAN Take 1 tablet (1 mg total) by mouth 2 (two) times daily. For severe anxiety/agitation What changed:  additional instructions  Indication:  Agitation, Severe anxiety   nicotine polacrilex 2 MG gum Commonly known as:  NICORETTE Take 1 each (2 mg total) by mouth as needed for smoking cessation. (May purchase from over the counter at the pharmacy): For smoking cessation  Indication:  Nicotine Addiction   traZODone 100 MG tablet Commonly known as:  DESYREL Take 1 tablet (100 mg total) by mouth at bedtime. For sleep What changed:  additional instructions  Indication:  Trouble Sleeping      Follow-up Information    [facility] Follow up.   Why:  Call them the day of d/c to confirm when Court will be by to see you again. Contact information: [address] [place] [address] [phone]          Follow-up recommendations: Activity:  As tolerated Diet: As recommended by your primary care doctor. Keep all scheduled follow-up appointments as recommended.   Comments: Patient is instructed prior to discharge to: Take all medications as prescribed by his/her mental healthcare provider. Report any adverse effects and or reactions from the medicines to his/her outpatient provider promptly. Patient has been instructed & cautioned: To not engage in alcohol and or illegal drug use while on prescription medicines. In the event of worsening symptoms, patient is instructed to  call the crisis hotline, 911 and or go to the nearest ED for appropriate evaluation and treatment of symptoms. To follow-up with his/her primary care provider for your other medical issues, concerns and or health care needs.     Signed:  Sanjuana KavaNwoko, Agnes I, PMHNP, FNP-BC  02/15/2017, 10:19 AM    Patient seen, Suicide Assessment Completed.  Disposition Plan Reviewed   Jonnie KindChanda Izquierdo is a 41 y/o F with history of schizoaffective disorder bipolar type who was admitted on IVC placed by her mother with concern for worsening symptoms of mania and threatsof violenceto her family. Shewas restarted on lithium and she started trial of saphris. Pt had some ongoing agitation and intrusive behaviors which resulted in being placed on1:1 with staff, but with time to stabilize on the inpatient unit, pt had improvement of her behaviors and initial presenting symptoms.  Today upon evaluation, pt reports she is doing well overall. She is calm, pleasant, and cooperative. She denies SI/HI/AH/VH. She is sleeping well and her appetite is good. She is in agreement to continue on her current regimen and to have follow up with her ACT team. She  shares that her mother is still snowed in and cannot leave the driveway, so her plan is to take a taxi back to her home. Pt was able to engage in safety planning including [facility] or contact emergency services if she feels unable to maintain her own safety or the safety of others.    Plan Of Care/Follow-up recommendations:   -Discharge to outpatient level of care (pt will follow up with [facility])  - Schizoaffective bipolar type -Continuelithium 450mg  BID -Continue saphris 5mg  BID - Insomnia -Continuetrazodone 50 mg qhs prn insomnia   Activity:  as tolerated Diet:  normal Tests:  NA Other:  see above for DC plan  Micheal Likenshristopher T Evelyne Makepeace, MD
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
## 2017-02-15 NOTE — Progress Notes (Signed)
Recreation Therapy Notes  Date: 02/15/17 Time: 1000 Location: 500 Hall Dayroom  Group Topic: Communication  Goal Area(s) Addresses:  Patient will effectively communicate with peers in group.  Patient will verbalize benefit of healthy communication. Patient will verbalize positive effect of healthy communication on post d/c goals.  Patient will identify communication techniques that made activity effective for group.   Intervention:  Blank paper, pencils, geometrical drawings                   Activity: Back to Back Drawings.  Patients were grouped in pairs.  Patients were seated back to back.  One person was given a picture to describe to their partner.  The person listening could not ask any questions of the speaker.  Once they were finished, they could compare pictures to see how accurate the listener was in drawing the picture.  Education:Communication, Discharge Planning  Education Outcome: Acknowledges understanding/In group clarification offered/Needs additional education.   Clinical Observations/Feedback: Pt did not attend group.    Caroll RancherMarjette Kuba, LRT/CTRS       Caroll RancherLindsay, Blaze Nylund A 02/15/2017 12:03 PM

## 2017-02-15 NOTE — Progress Notes (Signed)
Recreation Therapy Notes  INPATIENT RECREATION TR PLAN  Patient Details Name: [name] MRN: [id number] DOB: 02-07-1976 Today's Date: 02/15/2017  Rec Therapy Plan Is patient appropriate for Therapeutic Recreation?: Yes Treatment times per week: about 3 days Estimated Length of Stay: 5-7 days TR Treatment/Interventions: Group participation (Comment)  Discharge Criteria Pt will be discharged from therapy if:: Discharged Treatment plan/goals/alternatives discussed and agreed upon by:: Patient/family  Discharge Summary Short term goals set: Pt will be able to demonstrate improved self esteem by identifying positive qualities about self. Short term goals met: Complete Progress toward goals comments: Groups attended Which groups?: Wellness, Self-esteem Reason goals not met: None Therapeutic equipment acquired: N/[name] Reason patient discharged from therapy: Discharge from hospital Pt/family agrees with progress & goals achieved: Yes Date patient discharged from therapy: 02/15/17    Victorino Sparrow, LRT/CTRS  Ria Comment, [name] 02/15/2017, 11:32 AM

## 2017-02-15 NOTE — Plan of Care (Signed)
Pt was able to identify positive qualities about herself at conclusion of self esteem recreation therapy session.  Caroll RancherMarjette Goldsmith, LRT/CTRS

## 2017-02-15 NOTE — Progress Notes (Signed)
[name], female   DOB: 09/08/1975, 41 y.o.   MRN: [id number] Patient discharged to home/self care.  Patient denies SI, HI and AVH upon discharge and stated that incidents that precipitated admission have been resolved.  Patient has a plan in place to go home with mom and continue with treatment in the outpatient setting. Patient acknowledged understanding of all discharge instructions and receipt of all personal belongings.

## 2017-07-05 ENCOUNTER — Other Ambulatory Visit: Payer: Self-pay

## 2017-07-05 ENCOUNTER — Encounter (HOSPITAL_COMMUNITY): Payer: Self-pay | Admitting: Nurse Practitioner

## 2017-07-05 ENCOUNTER — Emergency Department (HOSPITAL_COMMUNITY)
Admission: EM | Admit: 2017-07-05 | Discharge: 2017-07-05 | Disposition: A | Discharge status: Other Acute Inpatient Hospital | Payer: Medicaid Other | Attending: Emergency Medicine | Admitting: Emergency Medicine

## 2017-07-05 ENCOUNTER — Inpatient Hospital Stay (HOSPITAL_COMMUNITY)
Admission: AD | Admit: 2017-07-05 | Discharge: 2017-07-13 | DRG: 885 | Disposition: A | Discharge status: Home / Self Care | Payer: Medicaid Other | Source: Intra-hospital | Attending: Psychiatry | Admitting: Psychiatry

## 2017-07-05 ENCOUNTER — Encounter (HOSPITAL_COMMUNITY): Payer: Self-pay

## 2017-07-05 DIAGNOSIS — F429 Obsessive-compulsive disorder, unspecified: Secondary | ICD-10-CM | POA: Diagnosis present

## 2017-07-05 DIAGNOSIS — F17210 Nicotine dependence, cigarettes, uncomplicated: Secondary | ICD-10-CM | POA: Diagnosis present

## 2017-07-05 DIAGNOSIS — Z87892 Personal history of anaphylaxis: Secondary | ICD-10-CM

## 2017-07-05 DIAGNOSIS — Z888 Allergy status to other drugs, medicaments and biological substances status: Secondary | ICD-10-CM | POA: Diagnosis not present

## 2017-07-05 DIAGNOSIS — Z79899 Other long term (current) drug therapy: Secondary | ICD-10-CM

## 2017-07-05 DIAGNOSIS — G259 Extrapyramidal and movement disorder, unspecified: Secondary | ICD-10-CM | POA: Diagnosis present

## 2017-07-05 DIAGNOSIS — Z813 Family history of other psychoactive substance abuse and dependence: Secondary | ICD-10-CM | POA: Diagnosis not present

## 2017-07-05 DIAGNOSIS — Z818 Family history of other mental and behavioral disorders: Secondary | ICD-10-CM | POA: Diagnosis not present

## 2017-07-05 DIAGNOSIS — F250 Schizoaffective disorder, bipolar type: Secondary | ICD-10-CM | POA: Diagnosis not present

## 2017-07-05 DIAGNOSIS — R451 Restlessness and agitation: Secondary | ICD-10-CM | POA: Diagnosis not present

## 2017-07-05 DIAGNOSIS — F1290 Cannabis use, unspecified, uncomplicated: Secondary | ICD-10-CM | POA: Diagnosis not present

## 2017-07-05 DIAGNOSIS — Z9114 Patient's other noncompliance with medication regimen: Secondary | ICD-10-CM

## 2017-07-05 DIAGNOSIS — F419 Anxiety disorder, unspecified: Secondary | ICD-10-CM | POA: Diagnosis not present

## 2017-07-05 DIAGNOSIS — R450 Nervousness: Secondary | ICD-10-CM | POA: Diagnosis not present

## 2017-07-05 DIAGNOSIS — F312 Bipolar disorder, current episode manic severe with psychotic features: Secondary | ICD-10-CM | POA: Insufficient documentation

## 2017-07-05 DIAGNOSIS — G4700 Insomnia, unspecified: Secondary | ICD-10-CM | POA: Diagnosis present

## 2017-07-05 DIAGNOSIS — F29 Unspecified psychosis not due to a substance or known physiological condition: Secondary | ICD-10-CM | POA: Diagnosis present

## 2017-07-05 LAB — I-STAT BETA HCG BLOOD, ED (MC, WL, AP ONLY)

## 2017-07-05 LAB — COMPREHENSIVE METABOLIC PANEL
ALK PHOS: 54 U/L (ref 38–126)
ALT: 21 U/L (ref 14–54)
ANION GAP: 11 (ref 5–15)
AST: 26 U/L (ref 15–41)
Albumin: 4.6 g/dL (ref 3.5–5.0)
BUN: 27 mg/dL — ABNORMAL HIGH (ref 6–20)
CALCIUM: 9.9 mg/dL (ref 8.9–10.3)
CO2: 18 mmol/L — AB (ref 22–32)
CREATININE: 1.55 mg/dL — AB (ref 0.44–1.00)
Chloride: 110 mmol/L (ref 101–111)
GFR calc non Af Amer: 41 mL/min — ABNORMAL LOW (ref >60)
GFR, EST AFRICAN AMERICAN: 48 mL/min — AB (ref >60)
Glucose, Bld: 141 mg/dL — ABNORMAL HIGH (ref 65–99)
Potassium: 3.7 mmol/L (ref 3.5–5.1)
SODIUM: 139 mmol/L (ref 135–145)
Total Bilirubin: 0.9 mg/dL (ref 0.3–1.2)
Total Protein: 9.4 g/dL — ABNORMAL HIGH (ref 6.5–8.1)

## 2017-07-05 LAB — CBC
HCT: 35.3 % — ABNORMAL LOW (ref 36.0–46.0)
HEMOGLOBIN: 11.8 g/dL — AB (ref 12.0–15.0)
MCH: 26.3 pg (ref 26.0–34.0)
MCHC: 33.4 g/dL (ref 30.0–36.0)
MCV: 78.8 fL (ref 78.0–100.0)
PLATELETS: 389 10*3/uL (ref 150–400)
RBC: 4.48 MIL/uL (ref 3.87–5.11)
RDW: 15.8 % — ABNORMAL HIGH (ref 11.5–15.5)
WBC: 10.4 10*3/uL (ref 4.0–10.5)

## 2017-07-05 LAB — RAPID URINE DRUG SCREEN, HOSP PERFORMED
AMPHETAMINES: NOT DETECTED
Barbiturates: NOT DETECTED
Benzodiazepines: NOT DETECTED
COCAINE: NOT DETECTED
OPIATES: NOT DETECTED
TETRAHYDROCANNABINOL: NOT DETECTED

## 2017-07-05 LAB — ETHANOL

## 2017-07-05 MED ORDER — ALUM & MAG HYDROXIDE-SIMETH 200-200-20 MG/5ML PO SUSP: 30.0000 mL | Freq: Four times a day (QID) | ORAL | Status: DC | PRN

## 2017-07-05 MED ORDER — IBUPROFEN 600 MG PO TABS: 600.0000 mg | ORAL_TABLET | Freq: Three times a day (TID) | ORAL | Status: DC | PRN

## 2017-07-05 MED ORDER — ASENAPINE MALEATE 5 MG SL SUBL
5.0000 mg | SUBLINGUAL_TABLET | Freq: Two times a day (BID) | SUBLINGUAL | Status: DC
  Administered 2017-07-05: 5 mg via SUBLINGUAL
  Filled 2017-07-05: qty 1

## 2017-07-05 MED ORDER — TRAZODONE HCL 100 MG PO TABS
100.0000 mg | ORAL_TABLET | Freq: Every day | ORAL | Status: DC
  Administered 2017-07-05: 100 mg via ORAL
  Filled 2017-07-05 (×3): qty 1

## 2017-07-05 MED ORDER — BENZTROPINE MESYLATE 1 MG PO TABS: 1.0000 mg | ORAL_TABLET | Freq: Every day | ORAL | Status: DC

## 2017-07-05 MED ORDER — ALUM & MAG HYDROXIDE-SIMETH 200-200-20 MG/5ML PO SUSP: 30.0000 mL | ORAL | Status: DC | PRN

## 2017-07-05 MED ORDER — MAGNESIUM HYDROXIDE 400 MG/5ML PO SUSP
30.0000 mL | Freq: Every day | ORAL | Status: DC | PRN
  Administered 2017-07-06 (×3): 30 mL via ORAL
  Filled 2017-07-05 (×3): qty 30

## 2017-07-05 MED ORDER — IBUPROFEN 200 MG PO TABS: 600.0000 mg | ORAL_TABLET | Freq: Three times a day (TID) | ORAL | Status: DC | PRN

## 2017-07-05 MED ORDER — LITHIUM CARBONATE ER 450 MG PO TBCR
450.0000 mg | EXTENDED_RELEASE_TABLET | Freq: Two times a day (BID) | ORAL | Status: DC
  Administered 2017-07-05 – 2017-07-09 (×8): 450 mg via ORAL
  Filled 2017-07-05 (×10): qty 1

## 2017-07-05 MED ORDER — ONDANSETRON HCL 4 MG PO TABS: 4.0000 mg | ORAL_TABLET | Freq: Three times a day (TID) | ORAL | Status: DC | PRN

## 2017-07-05 MED ORDER — LORAZEPAM 1 MG PO TABS
1.0000 mg | ORAL_TABLET | Freq: Two times a day (BID) | ORAL | Status: DC
  Administered 2017-07-05 – 2017-07-06 (×2): 1 mg via ORAL
  Filled 2017-07-05 (×2): qty 1

## 2017-07-05 MED ORDER — BENZTROPINE MESYLATE 1 MG PO TABS
1.0000 mg | ORAL_TABLET | Freq: Every day | ORAL | Status: DC
  Administered 2017-07-05: 1 mg via ORAL
  Filled 2017-07-05 (×3): qty 1

## 2017-07-05 MED ORDER — CHLORPROMAZINE HCL 25 MG/ML IJ SOLN: 50.0000 mg | Freq: Three times a day (TID) | INTRAMUSCULAR | Status: DC | PRN

## 2017-07-05 MED ORDER — ACETAMINOPHEN 325 MG PO TABS: 650.0000 mg | ORAL_TABLET | Freq: Four times a day (QID) | ORAL | Status: DC | PRN

## 2017-07-05 MED ORDER — ASENAPINE MALEATE 5 MG SL SUBL
5.0000 mg | SUBLINGUAL_TABLET | Freq: Two times a day (BID) | SUBLINGUAL | Status: DC
  Administered 2017-07-05 – 2017-07-06 (×2): 5 mg via SUBLINGUAL
  Filled 2017-07-05 (×6): qty 1

## 2017-07-05 MED ORDER — LITHIUM CARBONATE ER 450 MG PO TBCR
450.0000 mg | EXTENDED_RELEASE_TABLET | Freq: Two times a day (BID) | ORAL | Status: DC
  Administered 2017-07-05: 450 mg via ORAL
  Filled 2017-07-05: qty 1

## 2017-07-05 MED ORDER — TRAZODONE HCL 100 MG PO TABS: 100.0000 mg | ORAL_TABLET | Freq: Every day | ORAL | Status: DC

## 2017-07-05 MED ORDER — NICOTINE 21 MG/24HR TD PT24
21.0000 mg | MEDICATED_PATCH | Freq: Every day | TRANSDERMAL | Status: DC
  Administered 2017-07-06 – 2017-07-13 (×7): 21 mg via TRANSDERMAL
  Filled 2017-07-05 (×10): qty 1

## 2017-07-05 MED ORDER — LORAZEPAM 1 MG PO TABS
1.0000 mg | ORAL_TABLET | Freq: Two times a day (BID) | ORAL | Status: DC
  Administered 2017-07-05: 1 mg via ORAL
  Filled 2017-07-05: qty 1

## 2017-07-05 MED ORDER — HYDROXYZINE HCL 25 MG PO TABS
25.0000 mg | ORAL_TABLET | Freq: Three times a day (TID) | ORAL | Status: DC | PRN
  Administered 2017-07-05: 25 mg via ORAL
  Filled 2017-07-05: qty 1

## 2017-07-05 MED ORDER — NICOTINE 21 MG/24HR TD PT24
21.0000 mg | MEDICATED_PATCH | Freq: Every day | TRANSDERMAL | Status: DC
  Administered 2017-07-05: 21 mg via TRANSDERMAL
  Filled 2017-07-05: qty 1
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
## 2017-07-05 NOTE — BH Assessment (Signed)
[facility] Assessment Progress Note  Per Juanetta Beets, DO, this pt requires psychiatric hospitalization.  Berneice Heinrich, RN, [facility] has assigned pt to [facility] Rm 501-1.  Dr Sharma Covert also finds that pt meets criteria for IVC, which she has initiated.  IVC documents have been faxed to [facility], and at 11:50 Ok Edwards confirms receipt.  She has since faxed Findings and Custody Order to this Clinical research associate.  At 12:00 I called [employer] and spoke to [employer], who took demographic information, agreeing to dispatch law enforcement to fill out Return of Service.  [employer] then presented at [facility], completing Return of Service, after which IVC documents were faxed to [facility].  Pt's nurse, Diane, has been notified, and agrees to call report to [phone].  Pt is to be transported via Patent examiner.   Doylene Canning, [place] Behavioral Health Coordinator [phone]

## 2017-07-05 NOTE — ED Notes (Signed)
TTS AT BEDSIDE 

## 2017-07-05 NOTE — ED Notes (Signed)
Called [employer] transport for pt to go to [facility].

## 2017-07-05 NOTE — ED Notes (Signed)
Pt wanded by security before being brought back to TCU 26

## 2017-07-05 NOTE — ED Notes (Signed)
Pt going into other rooms. Pt redirected from entering other rooms. This Clinical research associate redirected pt back to her room, reminding her of which room number she was in. Pt appears confused but redirectable at this time. RN notified. Will continue to monitor.
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
## 2017-07-05 NOTE — ED Notes (Signed)
Report given to [facility] RN at [facility].

## 2017-07-05 NOTE — ED Triage Notes (Signed)
Pt brought in by mother. Mother states she is off her medications for several weeks. Hx of mental illness EDP CAMPOS

## 2017-07-05 NOTE — ED Notes (Signed)
Pt transported to [facility] by [employer]. All belongings returned to pt. Pt was drowsy because of medications given, so did not sign for belongings. She was able to ambulate without assist. She was cooperative.
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
## 2017-07-05 NOTE — Progress Notes (Signed)
Nursing Progress Note: 7p-7a D: Pt currently presents with a irritable/anxious/agitation/angry/glaring/animated/labile/concrete/tangetnial/paranoid/persecutory/circumstantial/ loud and pressured speech/argumentative  affect and behavior. Pt states "No one listens to me. Psychiatrist and my act team keep giving me meds they know are killing me because they just want money. I don't want to hear any of it. I am not crazy and will not be treated as such. I know you are holding out on me. You just giving me these meds because I am black." Interacting inappropriately (invasively-talking to others with minimal space between them or trying to touch their hair)(loud/pressured/argumentative speech) with the milieu. Pt reports poor sleep during the previous night. Pt was not permitted to attend wrap-up group due to invasive and argumentative behaviors with staff and peers.   A: Pt provided with medications per providers orders. Pt's labs and vitals were monitored throughout the night. Pt supported emotionally and encouraged to express concerns and questions. Pt educated on medications.  R: Pt's safety ensured with 15 minute and environmental checks. Pt currently denies SI, HI, and AVH. Pt verbally contracts to seek staff if SI,HI, or AVH occurs and to consult with staff before acting on any harmful thoughts. Will continue to monitor.

## 2017-07-05 NOTE — ED Notes (Signed)
ED Provider at bedside. CAMPOS 

## 2017-07-05 NOTE — ED Notes (Addendum)
Pt Belongings: [employer], Black shirt, Wallace Cullens jeans/Pants- Belongings secured in locker #39. Pt belongings sheet filled out-belongings itemized. Pt signed belongings sheet, acknowledging understanding that belongings would stay locked up till discharge.

## 2017-07-05 NOTE — BHH Counselor (Signed)
Pt accepted to [facility]. Admitted is J. Sharma Covert, DO.Marland Kitchen  Attending is Dr. Jama Flavors.

## 2017-07-06 DIAGNOSIS — Z9114 Patient's other noncompliance with medication regimen: Secondary | ICD-10-CM

## 2017-07-06 MED ORDER — HYDROXYZINE HCL 50 MG PO TABS
50.0000 mg | ORAL_TABLET | Freq: Four times a day (QID) | ORAL | Status: DC | PRN
  Administered 2017-07-06 – 2017-07-11 (×6): 50 mg via ORAL
  Filled 2017-07-06: qty 1
  Filled 2017-07-06: qty 10
  Filled 2017-07-06 (×6): qty 1

## 2017-07-06 MED ORDER — CHLORPROMAZINE HCL 25 MG PO TABS
50.0000 mg | ORAL_TABLET | Freq: Four times a day (QID) | ORAL | Status: DC | PRN
  Administered 2017-07-10 – 2017-07-12 (×2): 50 mg via ORAL
  Filled 2017-07-06 (×2): qty 2

## 2017-07-06 MED ORDER — ASENAPINE MALEATE 5 MG SL SUBL
10.0000 mg | SUBLINGUAL_TABLET | Freq: Two times a day (BID) | SUBLINGUAL | Status: DC
  Administered 2017-07-06 – 2017-07-13 (×14): 10 mg via SUBLINGUAL
  Filled 2017-07-06 (×16): qty 2

## 2017-07-06 MED ORDER — CHLORPROMAZINE HCL 25 MG/ML IJ SOLN: 50.0000 mg | Freq: Four times a day (QID) | INTRAMUSCULAR | Status: DC | PRN

## 2017-07-06 MED ORDER — BENZTROPINE MESYLATE 1 MG PO TABS: 1.0000 mg | ORAL_TABLET | Freq: Two times a day (BID) | ORAL | Status: DC | PRN

## 2017-07-06 MED ORDER — TRAZODONE HCL 100 MG PO TABS
100.0000 mg | ORAL_TABLET | Freq: Every evening | ORAL | Status: DC | PRN
  Administered 2017-07-07 – 2017-07-12 (×11): 100 mg via ORAL
  Filled 2017-07-06 (×9): qty 1

## 2017-07-06 NOTE — Progress Notes (Signed)
Adult Psychoeducational Group Note  Date:  07/06/2017 Time:  8:49 PM  Group Topic/Focus:  Wrap-Up Group:   The focus of this group is to help patients review their daily goal of treatment and discuss progress on daily workbooks.  Participation Level:  Active  Participation Quality:  Appropriate  Affect:  Appropriate  Cognitive:  Appropriate  Insight: Appropriate  Engagement in Group:  Engaged  Modes of Intervention:  Discussion  Additional Comments: The patient attended grouip.  Octavio Manns 07/06/2017, 8:49 PM

## 2017-07-06 NOTE — Progress Notes (Signed)
1:1 Note: Patient maintained on constant supervision for safety.  Patient is irritable, loud, anxious and argumentative with staff and peers.  Redirected several times with poor effect.  Medications given as prescribed and prn for anxiety with fair effect.  Routine safety checks maintained every 15 minutes.  Patient is safe on the unit with supervision.
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
## 2017-07-06 NOTE — Progress Notes (Signed)
1:1 Note: Patient maintained on  Constant supervision for safety.  Patient pacing the hallway and is visible in the dayroom.  Continues to need a lot of redirection on the unit.  Routine safety checks maintained every 15 minutes.  Patient is safe on the unit with supervision.

## 2017-07-06 NOTE — Progress Notes (Signed)
Recreation Therapy Notes  Date: 5.2.19 Time: 0950 Location: 500 Hall  Group Topic: Support Systems  Goal Area(s) Addresses:  Patient will be able to identify the benefits of healthy support systems. Patient will be able to identify the positive effects of support systems. Patient will be able to identify how support systems can be used post d/c.  Intervention: Rubber Discs  Activity: Sharks in the Water.  Each patient was given a rubber disc and the group was one disc more than the number of people in the group.  Patients were to use the discs to maneuver the group from the nurses station to the end of hall and back.  Education: Communication, Discharge Planning  Education Outcome: Acknowledges understanding/In group clarification offered/Needs additional education.   Clinical Observations/Feedback: Pt did not attend group.     [name], LRT/CTRS         [place], [name] 07/06/2017 11:31 AM 

## 2017-07-06 NOTE — B[facility] Group Notes (Signed)
[facility] [employer] Group Therapy  07/06/2017 , 1:23 PM    Type of Therapy:  Mental [employer] Presentation  Participation Level:  Active  Participation Quality:  Attentive  Affect:  Blunted  Cognitive:  Oriented  Insight:  Limited  Engagement in Therapy:  Engaged  Modes of Intervention:  Discussion, Education and Socialization  Summary of Progress/Problems:  [name]  from [employer] came to present her recovery story, encourage group  members to share something about their story, and present information about the MHA.  Intrusive, but redirectable.  On 1;1, which helped immensely. Shared at the end in a disjointed, tangential manner.  [name] 07/06/2017 , 1:23 PM

## 2017-07-06 NOTE — Progress Notes (Signed)
1: 1 Note: Patient maintained on constant supervision for safety.  Continues to have poor boundaries and impulse control.  Speech remained pressured and rapid.  Observed pacing the unit and requires lot of redirection on the unit.  Routine safety checks maintained every 15 minutes.  Patient is safe on the unit with supervision.

## 2017-07-06 NOTE — Progress Notes (Signed)
1: 1 Note: Patient maintained on constant supervision for safety.  Continues to have poor boundaries and impulse control throughout the evening.  Speech remained pressured and rapid.  Observed pacing the unit and requires lot of redirection on the unit.  Routine safety checks maintained every 15 minutes.  Patient is safe on the unit with supervision

## 2017-07-07 DIAGNOSIS — F419 Anxiety disorder, unspecified: Secondary | ICD-10-CM

## 2017-07-07 DIAGNOSIS — Z818 Family history of other mental and behavioral disorders: Secondary | ICD-10-CM

## 2017-07-07 DIAGNOSIS — Z813 Family history of other psychoactive substance abuse and dependence: Secondary | ICD-10-CM

## 2017-07-07 DIAGNOSIS — F17210 Nicotine dependence, cigarettes, uncomplicated: Secondary | ICD-10-CM

## 2017-07-07 DIAGNOSIS — F1290 Cannabis use, unspecified, uncomplicated: Secondary | ICD-10-CM

## 2017-07-07 DIAGNOSIS — G4700 Insomnia, unspecified: Secondary | ICD-10-CM

## 2017-07-07 DIAGNOSIS — R450 Nervousness: Secondary | ICD-10-CM

## 2017-07-07 DIAGNOSIS — F250 Schizoaffective disorder, bipolar type: Principal | ICD-10-CM

## 2017-07-07 LAB — LITHIUM LEVEL: Lithium Lvl: 0.67 mmol/L (ref 0.60–1.20)

## 2017-07-07 MED ORDER — BENZTROPINE MESYLATE 1 MG PO TABS
1.0000 mg | ORAL_TABLET | Freq: Two times a day (BID) | ORAL | Status: DC | PRN
  Administered 2017-07-10 – 2017-07-12 (×2): 1 mg via ORAL
  Filled 2017-07-07: qty 1
  Filled 2017-07-07: qty 14
  Filled 2017-07-07: qty 1

## 2017-07-07 MED ORDER — BENZTROPINE MESYLATE 1 MG/ML IJ SOLN
INTRAMUSCULAR | Status: AC
  Filled 2017-07-07: qty 2

## 2017-07-07 MED ORDER — BENZTROPINE MESYLATE 1 MG/ML IJ SOLN
1.0000 mg | Freq: Two times a day (BID) | INTRAMUSCULAR | Status: DC | PRN
  Administered 2017-07-07: 1 mg via INTRAMUSCULAR
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
## 2017-07-07 NOTE — Progress Notes (Signed)
Patient denies SI, HI and AVH.  Patient has been intrusive and needing much redirection.  Patient has been fixated on calling various people and believes that people are talking about her and laughing at her.  Patient has used abusive language toward staff and peers. Patient continues needing constant redirection and education due to intrusive behaviors.  Patient being monitored 1:1 with 1:1 staff in close proximity.  Continue to monitor as planned.

## 2017-07-07 NOTE — Progress Notes (Signed)
[facility] MD Progress Note  07/07/2017 12:56 PM [name]  MRN:  [id number]  Subjective: [name] reports, "Apparently, my mama is older than me & she has been hearing voices since I was a kid. She said to me, I'm talking to you. I started to pick up my stuff off the floor. I want my siblings to just live with my mama. I accidentally deleted my husband's phone number. Someone has to pay the bill. I'm trying to help my husband before I go on maternity leave. I'm doing well on the medicines, Haldol makes my throat close up. I need my car & my car keys to this place between [facility] to pay the bills".  [name] is a 42 y/o F with history of schizoaffective disorder bipolar type who was admitted from [facility] on IVC after she presented brought in by her mother with worsening symptoms of agitation, paranoia, insomnia, delusions, and medication non-adherence. Pt's mother provided collateral that pt has been off of her psychotropic medications for the past 4 weeks; pt has an outpatient ACT team. In the ED, she had made bizarre statements such as that she arrived by to the ED by plane crash or had driven through fire to get to the ED. She had poor insight and seemed internally preoccupied in the ED. She was placed on IVC and transferred to [facility] for additional treatment and stabilization.  Today, [name] is seen, chart reviewed. The chart findings discussed with the treatment team. She presents alert, disorganized, manic, intrusive with illogical pressured speech. She is pacing the hall way, in & out of her room. She is currently on 1:1 supervision. [name] has been started on the treatment regimen targeting her presenting symptoms. Her symptoms are yet to respond to her treatment regimen. She is being monitored closely   Principal Problem: Schizoaffective disorder, [name]  Diagnosis:   Patient Active Problem List   Diagnosis Date Noted  . Schizoaffective disorder, bipolar type  (HCC) [F25.0] 02/08/2017  . Adjustment disorder with disturbance of emotion [F43.29] 12/13/2016  . Agitation [R45.1]   . Mood disorder (HCC) [F39]   . Affective psychosis, bipolar (HCC) [F31.9]   . Bipolar affective disorder, currently manic, severe, with psychotic features (HCC) [F31.2] 09/24/2014  . Homicidal ideation [R45.850]   . Bipolar disorder, current episode manic w/o psychotic features, severe (HCC) [F31.13] 08/21/2014  . Cannabis use disorder, moderate, dependence (HCC) [F12.20] 08/21/2014  . Tobacco use disorder [F17.200] 08/21/2014   Total Time spent with patient: 25 minutes  Past Psychiatric History: Bipolar affective disorder, manic episodes, severe.  Past Medical History:  Past Medical History:  Diagnosis Date  . Anxiety   . Bipolar affective (HCC)   . OCD (obsessive compulsive disorder)    History reviewed. No pertinent surgical history.  Family History:  Family History  Problem Relation Age of Onset  . Drug abuse Father   . Mental illness Other    Family Psychiatric  History: See H&P  Social History:  Social History   Substance and Sexual Activity  Alcohol Use Yes   Comment: socially     Social History   Substance and Sexual Activity  Drug Use Yes  . Types: Marijuana   Comment: occasionally    Social History   Socioeconomic History  . Marital status: Single    Spouse name: Not on file  . Number of children: Not on file  . Years of education: Not on file  . Highest education level: Not on  file  Occupational History  . Not on file  Social Needs  . Financial resource strain: Not on file  . Food insecurity:    Worry: Not on file    Inability: Not on file  . Transportation needs:    Medical: Not on file    Non-medical: Not on file  Tobacco Use  . Smoking status: Current Every Day Smoker    Packs/day: 1.00    Years: 1.00    Pack years: 1.00    Types: Cigarettes  . Smokeless tobacco: Never Used  Substance and Sexual Activity  . Alcohol  use: Yes    Comment: socially  . Drug use: Yes    Types: Marijuana    Comment: occasionally  . Sexual activity: Never    Birth control/protection: None  Lifestyle  . Physical activity:    Days per week: Not on file    Minutes per session: Not on file  . Stress: Not on file  Relationships  . Social connections:    Talks on phone: Not on file    Gets together: Not on file    Attends religious service: Not on file    Active member of club or organization: Not on file    Attends meetings of clubs or organizations: Not on file    Relationship status: Not on file  Other Topics Concern  . Not on file  Social History Narrative  . Not on file   Additional Social History:   Sleep: Fair  Appetite:  Fair  Current Medications: Current Facility-Administered Medications  Medication Dose Route Frequency Provider Last Rate Last Dose  . acetaminophen (TYLENOL) tablet 650 mg  650 mg Oral Q6H PRN Laveda Abbe, NP      . alum & mag hydroxide-simeth (MAALOX/MYLANTA) 200-200-20 MG/5ML suspension 30 mL  30 mL Oral Q4H PRN Laveda Abbe, NP      . asenapine (SAPHRIS) sublingual tablet 10 mg  10 mg Sublingual BID Micheal Likens, MD   10 mg at 07/07/17 0739  . benztropine (COGENTIN) tablet 1 mg  1 mg Oral BID PRN Micheal Likens, MD       Or  . benztropine mesylate (COGENTIN) injection 1 mg  1 mg Intramuscular BID PRN Micheal Likens, MD   1 mg at 07/07/17 0945  . benztropine mesylate (COGENTIN) 1 MG/ML injection           . chlorproMAZINE (THORAZINE) tablet 50 mg  50 mg Oral Q6H PRN Micheal Likens, MD       Or  . chlorproMAZINE (THORAZINE) injection 50 mg  50 mg Intramuscular Q6H PRN Micheal Likens, MD      . hydrOXYzine (ATARAX/VISTARIL) tablet 50 mg  50 mg Oral Q6H PRN Micheal Likens, MD   50 mg at 07/06/17 1954  . ibuprofen (ADVIL,MOTRIN) tablet 600 mg  600 mg Oral Q8H PRN Laveda Abbe, NP      . lithium  carbonate (ESKALITH) CR tablet 450 mg  450 mg Oral Q12H Laveda Abbe, NP   450 mg at 07/07/17 0739  . magnesium hydroxide (MILK OF MAGNESIA) suspension 30 mL  30 mL Oral Daily PRN Laveda Abbe, NP   30 mL at 07/06/17 2208  . nicotine (NICODERM CQ - dosed in mg/24 hours) patch 21 mg  21 mg Transdermal Daily Laveda Abbe, NP   21 mg at 07/07/17 0739  . ondansetron (ZOFRAN) tablet 4 mg  4 mg Oral Q8H PRN Arville Care,  Dorise Hiss, NP      . traZODone (DESYREL) tablet 100 mg  100 mg Oral QHS PRN,MR X 1 [place], Burlene Arnt, MD        Lab Results:  Results for orders placed or performed during the hospital encounter of 07/05/17 (from the past 48 hour(s))  Lithium level     Status: None   Collection Time: 07/07/17  7:35 AM  Result Value Ref Range   Lithium Lvl 0.67 0.60 - 1.20 mmol/L    Comment: Performed at [facility], [place], [address], [place], [place] [address]   Blood Alcohol level:  Lab Results  Component Value Date   ETH <10 07/05/2017   ETH <10 02/06/2017   Metabolic Disorder Labs: Lab Results  Component Value Date   HGBA1C 6.5 (H) 09/30/2014   MPG 140 09/30/2014   MPG 134 (H) 12/26/2012   Lab Results  Component Value Date   PROLACTIN 150.0 (H) 08/22/2014   Lab Results  Component Value Date   CHOL 137 09/30/2014   TRIG 98 09/30/2014   HDL 37 (L) 09/30/2014   CHOLHDL 3.7 09/30/2014   VLDL 20 09/30/2014   LDLCALC 80 09/30/2014    Physical Findings: AIMS:  , ,  ,  ,    CIWA:    COWS:     Musculoskeletal: Strength & Muscle Tone: within normal limits Gait & Station: normal Patient leans: N/A  Psychiatric Specialty Exam: Physical Exam  Review of Systems  Psychiatric/Behavioral: Negative for hallucinations, memory loss, substance abuse (Hx. canabis use disorder) and suicidal ideas. The patient is nervous/anxious and has insomnia.     Blood pressure (!) 132/91, pulse (!) 114, temperature 99.1 F (37.3 C), temperature  source Oral, resp. rate 16, height  (1.575 m), weight 83.5 kg (184 lb), SpO2 100 %.Body mass index is 33.65 kg/m.  General Appearance: Casual and Disheveled  Eye Contact:  Good  Speech:  Clear and Coherent and Pressured  Volume:  Normal  Mood:  Anxious, Dysphoric and Irritable  Affect:  Blunt and Labile  Thought Process:  Disorganized, Goal Directed and Descriptions of Associations: Loose  Orientation:  Full (Time, Place, and Person)  Thought Content:  Illogical, Delusions, Ideas of Reference:   Paranoia Delusions and Paranoid Ideation  Suicidal Thoughts:  No  Homicidal Thoughts:  No  Memory:  Immediate;   Fair Recent;   Fair Remote;   Poor  Judgement:  Poor  Insight:  Lacking  Psychomotor Activity:  Normal  Concentration:  Concentration: Poor  Recall:  Poor  Fund of Knowledge:  Fair  Language:  Fair  Akathisia:  No  Handed:    AIMS (if indicated):     Assets:  Resilience Social Support  ADL's:  Intact  Cognition:  WNL  Sleep:  Number of Hours: 5.5     Treatment Plan Summary: Daily contact with patient to assess and evaluate symptoms and progress in treatment.  - Continue inpatient hospitalization.  - Will continue today 07/07/2017 plan as below except where it is noted.  Mood control.     - Continue Saphris 10 mg po bid.  EPS.     - Benztropine 1 mg po bid prn      Or     - Benztropine 1 mg IM bid prn.  Anxiety.      - Continue Hydroxyzine 50 mg po Q 6 hours prn.  Mood stabilization.      - Continue Lithium CR 450 mg po bid.  Nicotine withdrawal symptoms.      - Continue Nicoderm 21 mg topically Q 24 hours.  Insomnia.      - Continue Trazodone 100 mg po Q hs prn, may repeat x 1.  Agitation/psychosis.       - Continue Thorazine 50 mg po Q 6 hours prn.         Or in unable to take orally,       -  Continue Thorazine injection 50 mg IM Q 6 hours prn  - Continue to encourage group attendance. - Discharge disposition on going.   Armandina Stammer, NP,  PMHNP, FNP-BC. 07/07/2017, 12:56 PM

## 2017-07-07 NOTE — [facility] Group Notes (Signed)
[facility] Group Notes:  (Nursing/MHT/Case Management/Adjunct)  Date:  07/07/2017  Time:  11:22 AM  Type of Therapy:  Orientation and Goals Group  Participation Level:  Active  Participation Quality:  Intrusive  Affect:  Irritable and Not Congruent  Cognitive:  Disorganized and Lacking  Insight:  Lacking  Engagement in Group:  Developing/Improving and Off Topic  Modes of Intervention:  Discussion and Orientation  Summary of Progress/Problems: Her goal for today is to get discharged and go home so that she can pay her bills.   Yamilex Borgwardt J Rielynn Trulson 07/07/2017, 11:22 AM

## 2017-07-07 NOTE — Progress Notes (Signed)
1:1 Note Pt. Maintained on constant supervision for safety.  Pt. Is resting with her eyes closed, respirations are even and unlabored.  Routine safety checks maintained every 15 minutes.  Patient is safe on the unit with supervision.

## 2017-07-07 NOTE — [facility] Group Notes (Signed)
LCSW Group Therapy Note  07/07/2017 1:15pm  Type of Therapy and Topic:  Group Therapy:  Feelings around Relapse and Recovery  Participation Level:  Active   Description of Group:    Patients in this group will discuss emotions they experience before and after a relapse. They will process how experiencing these feelings, or avoidance of experiencing them, relates to having a relapse. Facilitator will guide patients to explore emotions they have related to recovery. Patients will be encouraged to process which emotions are more powerful. They will be guided to discuss the emotional reaction significant others in their lives may have to their relapse or recovery. Patients will be assisted in exploring ways to respond to the emotions of others without this contributing to a relapse.  Therapeutic Goals: 1. Patient will identify two or more emotions that lead to a relapse for them 2. Patient will identify two emotions that result when they relapse 3. Patient will identify two emotions related to recovery 4. Patient will demonstrate ability to communicate their needs through discussion and/or role plays   Summary of Patient Progress:  Stayed the entire time, overly engaged. Intrusive.  Very focused on needing to get her bills paid.  Was able to identify multiple means of healthy lifestyle. It's unclear if she practices any of them.  Complained of ACT team and how she wants to use them as her crises plan, "but they don't pick up the phone.  Unable to identify a plan B.   Therapeutic Modalities:   Cognitive Behavioral Therapy Solution-Focused Therapy Assertiveness Training Relapse Prevention Therapy   Ida Rogue, LCSW 07/07/2017 11:20 AM

## 2017-07-07 NOTE — Progress Notes (Signed)
D: Patient denies SI, HI or AVH. Patient presents on 1:1 observation, she is agitated with rapid/pressured speech.  Pt. Repeatedly attempts to call her mother "to get contacts from my cell phone, I need to pay my bills".  Pt. Is intermittently redirectable, she was given a journal to write her thoughts down about what needs to be done in the morning which helped decrease her anxiety.    A: Patient given emotional support from RN. Patient encouraged to come to staff with concerns and/or questions. Patient's medication routine continued. Patient's orders and plan of care reviewed.   R: Patient remains appropriate and cooperative. Will continue to monitor patient q15 minutes for safety.

## 2017-07-07 NOTE — Progress Notes (Signed)
Recreation Therapy Notes  Date: 5.3.19 Time: 1000 Location: 500 Hall Dayroom  Group Topic: Music Therapy  Goal Area(s) Addresses:  Patient will be able to identify the benefits of music. Patient will be able to identify emotions that come from music. Patient will identify how music can be used post d/c.   Behavioral Response: Engaged  Intervention: Music  Activity: Music Therapy.  Patients were to identify some of the feelings they have when listening to music.  Patients also shared with the group some of their favorite songs if appropriate.  Education: Communication, Discharge Planning  Education Outcome: Acknowledges understanding/In group clarification offered/Needs additional education.   Clinical Observations/Feedback:  [name] stated music makes her feel good "but not when I want to go home".  [name] also stated music gives her migraines sometimes.  [name] expressed one of her favorite songs was Tempo by Eston Esters.  [name] was appropriate for the most part but needed redirection when she got off task.  [name] left early and did not return.     Caroll Rancher, LRT/CTRS     Lillia Abed, Ferguson Gertner A 07/07/2017 11:50 AM

## 2017-07-07 NOTE — Progress Notes (Signed)
Patient denies SI, HI and AVH.  Patient has been intrusive and needing much redirection.  Patient has been fixated on calling various people and believes that people are talking about her and laughing at her.  Patient has used abusive language toward staff and peers. Patient continues needing constant redirection and education due to intrusive behaviors.  Patient being monitored 1:1 with 1:1 staff in close proximity.  Continue to monitor as planned.  

## 2017-07-07 NOTE — Progress Notes (Signed)
1:1 Note Pt. Maintained on constant supervision for safety.  Pt. Is resting with her eyes closed, respirations are even and unlabored.  Routine safety checks maintained every 15 minutes.  Patient is safe on the unit with supervision. 

## 2017-07-08 DIAGNOSIS — R451 Restlessness and agitation: Secondary | ICD-10-CM

## 2017-07-08 MED ORDER — LORAZEPAM 1 MG PO TABS: 1.0000 mg | ORAL_TABLET | Freq: Once | ORAL | Status: DC

## 2017-07-08 MED ORDER — LORAZEPAM 1 MG PO TABS
1.0000 mg | ORAL_TABLET | Freq: Once | ORAL | Status: AC
  Administered 2017-07-08: 1 mg via ORAL
  Filled 2017-07-08: qty 1

## 2017-07-08 NOTE — [facility] Group Notes (Signed)
LCSW Therapy Group   07/08/2017 10:15AM               Type of Therapy and Topic:  Group Therapy: Anger Cues and Responses  Participation Level:  Active  In this group, patients learned how to recognize the physical, cognitive, emotional, and behavioral responses they have to anger-provoking situations.  They identified a recent time they became angry and how they reacted.  They analyzed how their reaction was possibly beneficial and how it was possibly unhelpful.  The group discussed anger warning signs and how to know when our anger can potentially become a problem.  Therapeutic Goals: 1. Patients will remember their last incident of anger and how they felt emotionally and physically, what their thoughts were at the time, and how they behaved. 2. Patients will identify how their behavior at that time worked for them, as well as how it worked against them. 3. Patients will explore how their body, mind and feelings play a role with anger. 4. Patients will learn that anger itself is normal and cannot be eliminated, and that healthier reactions can assist with resolving conflict rather than worsening situations. 5. Patients will complete a personal Anger Inventory worksheet to identify and scale their anger triggers, physical/emotional/behavioral reactions to anger and how their anger impacts them.  Summary of Patient Progress:   Pt continues to work towards their tx goals but has not yet reached them. Pt was able to appropriately participate in group discussion, and was able to offer support/validation to other group members. Pt reported the last time she was angry was, "when I asked my mom to take care of a couple things for me while I'm in here and she didn't. It made me feel like my problems weren't important to her." Pt reported one way she can manage her anger is to, "listen to music and journal." Pt reported when she feels angry her "heartrate increases."   Therapeutic Modalities:    Cognitive Behavioral Therapy  Heidi Dach, MSW, LCSW Clinical Social Worker 07/08/2017 10:51 AM

## 2017-07-08 NOTE — Progress Notes (Signed)
Patient denies SI, HI and AVH.  Patient has been intrusive and needing much redirection.  Patient has been fixated on calling various people and believes that people are talking about her and laughing at her.  Patient has used abusive language toward staff and peers. Patient continues needing constant redirection and education due to intrusive behaviors.

## 2017-07-08 NOTE — Progress Notes (Addendum)
[facility] MD Progress Note  07/08/2017 1:56 PM [name]  MRN:  [id number]   Subjective: Hind placed on  1:1 for safety and poor boundaries.  Patient seen resting in bedroom.  presents with slight irritation and limited insight.  Patient reports feeling fine.  Reports she was asked to come to the hospital so people can still her money.  Reports she is waiting for her mother to bring her wallet and cell phone so that she is able to pay her bills on Monday.  Denies that she is suicidal or homicidal during this assessment.  Patient continues to report paranoia and reports intermittent auditory hallucinations.  [name] is currently denying auditory or visual hallucinations during this assessment.  Reports she is medication compliant states she is tolerating medications well.  Reported that  patient was experiencing racing thoughts.  States she is resting well at night.  NP discussed medication adjustments.  Patient reports she is allergic to Geodon and is not interested in taking any other medication. Support and encouragement and reassurance was provided  History: [name] is a 42 y/o F with history of schizoaffective disorder bipolar type who was admitted from [facility] on IVC after she presented brought in by her mother with worsening symptoms of agitation, paranoia, insomnia, delusions, and medication non-adherence. Pt's mother provided collateral that pt has been off of her psychotropic medications for the past 4 weeks; pt has an outpatient ACT team. In the ED, she had made bizarre statements such as that she arrived by to the ED by plane crash or had driven through fire to get to the ED. She had poor insight and seemed internally preoccupied in the ED. She was placed on IVC and transferred to [facility] for additional treatment and stabilization.    Principal Problem: Schizoaffective disorder, [name] Diagnosis:   Patient Active Problem List   Diagnosis Date Noted  . Schizoaffective disorder,  [name] [F25.0] 02/08/2017  . Adjustment disorder with disturbance of emotion [F43.29] 12/13/2016  . Agitation [R45.1]   . Mood disorder (HCC) [F39]   . Affective psychosis, bipolar (HCC) [F31.9]   . Bipolar affective disorder, currently manic, severe, with psychotic features (HCC) [F31.2] 09/24/2014  . Homicidal ideation [R45.850]   . Bipolar disorder, current episode manic w/o psychotic features, severe (HCC) [F31.13] 08/21/2014  . Cannabis use disorder, moderate, dependence (HCC) [F12.20] 08/21/2014  . Tobacco use disorder [F17.200] 08/21/2014   Total Time spent with patient: 20 minutes  Past Psychiatric History:   Past Medical History:  Past Medical History:  Diagnosis Date  . Anxiety   . Bipolar affective (HCC)   . OCD (obsessive compulsive disorder)    History reviewed. No pertinent surgical history. Family History:  Family History  Problem Relation Age of Onset  . Drug abuse Father   . Mental illness Other    Family Psychiatric  History:  Social History:  Social History   Substance and Sexual Activity  Alcohol Use Yes   Comment: socially     Social History   Substance and Sexual Activity  Drug Use Yes  . Types: Marijuana   Comment: occasionally    Social History   Socioeconomic History  . Marital status: Single    Spouse name: Not on file  . Number of children: Not on file  . Years of education: Not on file  . Highest education level: Not on file  Occupational History  . Not on file  Social Needs  . Financial resource strain:  Not on file  . Food insecurity:    Worry: Not on file    Inability: Not on file  . Transportation needs:    Medical: Not on file    Non-medical: Not on file  Tobacco Use  . Smoking status: Current Every Day Smoker    Packs/day: 1.00    Years: 1.00    Pack years: 1.00    Types: Cigarettes  . Smokeless tobacco: Never Used  Substance and Sexual Activity  . Alcohol use: Yes    Comment: socially  . Drug use: Yes     Types: Marijuana    Comment: occasionally  . Sexual activity: Never    Birth control/protection: None  Lifestyle  . Physical activity:    Days per week: Not on file    Minutes per session: Not on file  . Stress: Not on file  Relationships  . Social connections:    Talks on phone: Not on file    Gets together: Not on file    Attends religious service: Not on file    Active member of club or organization: Not on file    Attends meetings of clubs or organizations: Not on file    Relationship status: Not on file  Other Topics Concern  . Not on file  Social History Narrative  . Not on file   Additional Social History:                         Sleep: Fair  Appetite:  Fair  Current Medications: Current Facility-Administered Medications  Medication Dose Route Frequency Provider Last Rate Last Dose  . acetaminophen (TYLENOL) tablet 650 mg  650 mg Oral Q6H PRN Laveda Abbe, NP      . alum & mag hydroxide-simeth (MAALOX/MYLANTA) 200-200-20 MG/5ML suspension 30 mL  30 mL Oral Q4H PRN Laveda Abbe, NP      . asenapine (SAPHRIS) sublingual tablet 10 mg  10 mg Sublingual BID Micheal Likens, MD   10 mg at 07/08/17 0940  . benztropine (COGENTIN) tablet 1 mg  1 mg Oral BID PRN Micheal Likens, MD       Or  . benztropine mesylate (COGENTIN) injection 1 mg  1 mg Intramuscular BID PRN Micheal Likens, MD   1 mg at 07/07/17 0945  . chlorproMAZINE (THORAZINE) tablet 50 mg  50 mg Oral Q6H PRN Micheal Likens, MD       Or  . chlorproMAZINE (THORAZINE) injection 50 mg  50 mg Intramuscular Q6H PRN Micheal Likens, MD      . hydrOXYzine (ATARAX/VISTARIL) tablet 50 mg  50 mg Oral Q6H PRN Micheal Likens, MD   50 mg at 07/06/17 1954  . ibuprofen (ADVIL,MOTRIN) tablet 600 mg  600 mg Oral Q8H PRN Laveda Abbe, NP      . lithium carbonate (ESKALITH) CR tablet 450 mg  450 mg Oral Q12H Laveda Abbe, NP    450 mg at 07/08/17 0940  . LORazepam (ATIVAN) tablet 1 mg  1 mg Oral Once Nira Conn A, NP      . magnesium hydroxide (MILK OF MAGNESIA) suspension 30 mL  30 mL Oral Daily PRN Laveda Abbe, NP   30 mL at 07/06/17 2208  . nicotine (NICODERM CQ - dosed in mg/24 hours) patch 21 mg  21 mg Transdermal Daily Laveda Abbe, NP   21 mg at 07/08/17 0940  . ondansetron (ZOFRAN) tablet 4  mg  4 mg Oral Q8H PRN Laveda Abbe, NP      . traZODone (DESYREL) tablet 100 mg  100 mg Oral QHS PRN,MR X 1 Micheal Likens, MD   100 mg at 07/08/17 1610    Lab Results:  Results for orders placed or performed during the hospital encounter of 07/05/17 (from the past 48 hour(s))  Lithium level     Status: None   Collection Time: 07/07/17  7:35 AM  Result Value Ref Range   Lithium Lvl 0.67 0.60 - 1.20 mmol/L    Comment: Performed at [facility], [address] [place], [place] [address]    Blood Alcohol level:  Lab Results  Component Value Date   ETH <10 07/05/2017   ETH <10 02/06/2017    Metabolic Disorder Labs: Lab Results  Component Value Date   HGBA1C 6.5 (H) 09/30/2014   MPG 140 09/30/2014   MPG 134 (H) 12/26/2012   Lab Results  Component Value Date   PROLACTIN 150.0 (H) 08/22/2014   Lab Results  Component Value Date   CHOL 137 09/30/2014   TRIG 98 09/30/2014   HDL 37 (L) 09/30/2014   CHOLHDL 3.7 09/30/2014   VLDL 20 09/30/2014   LDLCALC 80 09/30/2014    Physical Findings: AIMS:  , ,  ,  ,    CIWA:    COWS:     Musculoskeletal: Strength & Muscle Tone: within normal limits Gait & Station: normal Patient leans: N/A  Psychiatric Specialty Exam: Physical Exam  Constitutional: She appears well-developed.  Psychiatric: She has a normal mood and affect. Her behavior is normal.    Review of Systems  Psychiatric/Behavioral: Positive for hallucinations. Negative for suicidal ideas. The patient is nervous/anxious.   All other systems  reviewed and are negative.   Blood pressure 98/73, pulse (!) 105, temperature 98.3 F (36.8 C), temperature source Oral, resp. rate 20, height  (1.575 m), weight 83.5 kg (184 lb), SpO2 100 %.Body mass index is 33.65 kg/m.  General Appearance: Casual  Eye Contact:  Good  Speech:  Clear and Coherent and Pressured  Volume:  Normal  Mood:  Anxious and Irritable  Affect:  Congruent and Labile  Thought Process:  Coherent  Orientation:  Full (Time, Place, and Person)  Thought Content:  Hallucinations: Auditory, Paranoid Ideation and Rumination  Suicidal Thoughts:  No  Homicidal Thoughts:  No  Memory:  Immediate;   Fair Recent;   Fair Remote;   Fair  Judgement:  Fair  Insight:  Fair  Psychomotor Activity:  Normal  Concentration:  Concentration: Fair  Recall:  [employer] of Knowledge:  Fair  Language:  Good  Akathisia:  No  Handed:  Right  AIMS (if indicated):     Assets:  Communication Skills Desire for Improvement Resilience Social Support  ADL's:  Intact  Cognition:  WNL  Sleep:  Number of Hours: 3     Treatment Plan Summary: Daily contact with patient to assess and evaluate symptoms and progress in treatment and Medication management .  Continue with current treatment plan listed below except were noted on 07/08/2017.  Mood stabilization:      - Continue Saphris 10 mg po BID   - Continue Lithium CR 450 mg po bid.  EPS.     - Benztropine 1 mg po bid prn      Or     - Benztropine 1 mg IM bid prn.  Anxiety.      -  Continue Hydroxyzine 50 mg po Q 6 hours prn.  Nicotine withdrawal symptoms.      - Continue Nicoderm 21 mg topically Q 24 hours.  Insomnia.      - Continue Trazodone 100 mg po Q hs prn, may repeat x 1.  Agitation/psychosis       - Continue Thorazine 50 mg po Q 6 hours prn.         Or in unable to take orally,       -  Continue Thorazine injection 50 mg IM Q 6 hours prn  - Continue to encourage group attendance. - Discharge disposition on  going.    Oneta Rack, NP 07/08/2017, 1:56 PM  ..Agree with NP Progress Note

## 2017-07-08 NOTE — Progress Notes (Signed)
Patient denies SI, HI and AVH.  Patient has been intrusive and needing much redirection.  Patient has been fixated on calling various people and believes that people are talking about her and laughing at her.  Patient has used abusive language toward staff and peers. Patient continues needing constant redirection and education due to intrusive behaviors.    

## 2017-07-08 NOTE — Progress Notes (Signed)
Adult Psychoeducational Group Note  Date:  07/08/2017 Time:  11:45 PM  Group Topic/Focus:  Wrap-Up Group:   The focus of this group is to help patients review their daily goal of treatment and discuss progress on daily workbooks.  Participation Level:  Did Not Attend  Participation Quality:  Did Not attend  Affect:  Did Not Attend  Cognitive:  Did Not Attend  Insight: None  Engagement in Group:  Did Not Attend  Modes of Intervention:  Did Not Attend  Additional Comments: Pt did not attend evening wrap up group tonight.  Felipa Furnace 07/08/2017, 11:45 PM

## 2017-07-08 NOTE — Progress Notes (Signed)
[name], female   DOB: 1975-08-05, 42 y.o.   MRN: [id number]   1:1 Note   Pt at the time of assessment denied all; "there is nothing wrong with me; I'm just here because some people want me to be here." Pt continue to be very hyper-verbal; continue to be very aggressive verbally. Patient continues needing constant redirection and education due to intrusive behaviors. Pt at this time is in the dayroom interacting with 1:1 staff. 1:1 staff is present with Pt at this time. 1:1 monitoring continues for Pt's safety.

## 2017-07-08 NOTE — Progress Notes (Signed)
[name], female   DOB: 1975/12/02, 42 y.o.   MRN: [id number] Pt at this time is in bed resting with eyes closed. Pt does not look to be in any distress at this time. 1:1 staff is present in room with Pt at this time. 1:1 monitoring continues for Pt's safety. 15-minute safety checks also continues at this time.

## 2017-07-09 MED ORDER — LORAZEPAM 1 MG PO TABS
1.0000 mg | ORAL_TABLET | Freq: Four times a day (QID) | ORAL | Status: DC | PRN
  Administered 2017-07-10 – 2017-07-12 (×3): 1 mg via ORAL
  Filled 2017-07-09 (×4): qty 1

## 2017-07-09 MED ORDER — LORAZEPAM 1 MG PO TABS
ORAL_TABLET | ORAL | Status: AC
  Administered 2017-07-09: 1 mg
  Filled 2017-07-09: qty 1

## 2017-07-09 MED ORDER — LITHIUM CARBONATE ER 300 MG PO TBCR
600.0000 mg | EXTENDED_RELEASE_TABLET | Freq: Two times a day (BID) | ORAL | Status: DC
  Administered 2017-07-10 – 2017-07-13 (×7): 600 mg via ORAL
  Filled 2017-07-09 (×9): qty 2

## 2017-07-09 MED ORDER — OLANZAPINE 2.5 MG PO TABS
2.5000 mg | ORAL_TABLET | Freq: Two times a day (BID) | ORAL | Status: DC
  Administered 2017-07-10: 2.5 mg via ORAL
  Filled 2017-07-09 (×5): qty 1

## 2017-07-09 NOTE — Plan of Care (Signed)
D: Pt denies SI/HI/AVH. Pt is pleasant and cooperative. Pt continues to be verbally aggressive, but pt is very redirectable.  Pt more pleasant this evening. Pt stated she was doing better , pt had pleasant conversation with writer this evening.  A: Pt was offered support and encouragement. Pt was given scheduled medications. Pt was encourage to attend groups. Q 15 minute checks were done for safety.  R:Pt attends groups and interacts well with peers and staff. Pt is taking medication. Pt has no complaints.Pt receptive to treatment and safety maintained on unit.   Problem: Education: Goal: Emotional status will improve Outcome: Progressing   Problem: Education: Goal: Mental status will improve Outcome: Progressing   Problem: Activity: Goal: Sleeping patterns will improve Outcome: Progressing   Problem: Coping: Goal: Ability to demonstrate self-control will improve Outcome: Progressing

## 2017-07-09 NOTE — [facility] Group Notes (Signed)
[facility] Group Notes:  (Nursing/MHT/Case Management/Adjunct)  Date:  07/09/2017  Time:  10:11 AM  Type of Therapy:  Patient self inventory group and goals group  Participation Level:  Active  Participation Quality:  Inattentive  Affect:  Anxious and Resistant  Cognitive:  Disorganized  Insight:  Lacking  Engagement in Group:  Lacking  Modes of Intervention:  Education  Summary of Progress/Problems: Pt did attend patient self inventory group and goals group.   [name] 07/09/2017, 10:11 AM

## 2017-07-09 NOTE — Progress Notes (Signed)
[name], female   DOB: 09/27/1975, 41 y.o.   MRN: [id number] Pt at this time is in bed resting with eyes closed. Pt does not look to be in any distress at this time. 1:1 staff is present in room with Pt at this time. 1:1 monitoring continues for Pt's safety. 15-minute safety checks also continues at this time. 

## 2017-07-09 NOTE — [facility] Group Notes (Signed)
[facility] Group Notes:  (Nursing/MHT/Case Management/Adjunct)  Date:  07/09/2017  Time:  10:11 AM  Type of Therapy:  Psychoeducational Skills  Participation Level:  Minimal  Participation Quality:  Resistant  Affect:  Resistant  Cognitive:  Disorganized  Insight:  Lacking  Engagement in Group:  Lacking  Modes of Intervention:  Problem-solving  Summary of Progress/Problems: Pt attended Psychoeducational group with topic healthy support system.   [name] 07/09/2017, 10:11 AM

## 2017-07-09 NOTE — Progress Notes (Signed)
[name], female   DOB: [birth year], 42 y.o.   MRN: [id number]    D: Pt in room sleep, sitter at bedside. No physical distress noted. A: 1:1 continued for patient safety. R: Pts safety maintained.

## 2017-07-09 NOTE — Progress Notes (Addendum)
[facility] MD Progress Note  07/09/2017 10:35 AM [name]  MRN:  [id number]   Subjective: [name] placed on  1:1 for safety and poor boundaries to be continued.  Patient seen resting in dayroom interacting with peers. Patient present with irritability and delusional with constant redirection.  Alyona present hyperverbal and is intrusive during this assessment. Chart and labs reviewed. NP ordered EKG, will initiate Zyprexa 2.5mg  PO BID for symptoms. Patient is inappropriate with thoughts.  Patient continues to need constant redirection.Cotninues to  Denies suicidal or homicidal ideations.  [name] is currently denying auditory or visual hallucinations during this assessment.  Reports she is medication compliant states she is tolerating medications well.  Reported that  patient was experiencing racing thoughts.  NP discussed medication adjustments with lithium and zyprex. Support and encouragement and reassurance was provided  History: [name] is a 42 y/o F with history of schizoaffective disorder bipolar type who was admitted from [facility] on IVC after she presented brought in by her mother with worsening symptoms of agitation, paranoia, insomnia, delusions, and medication non-adherence. Pt's mother provided collateral that pt has been off of her psychotropic medications for the past 4 weeks; pt has an outpatient ACT team. In the ED, she had made bizarre statements such as that she arrived by to the ED by plane crash or had driven through fire to get to the ED. She had poor insight and seemed internally preoccupied in the ED. She was placed on IVC and transferred to [facility] for additional treatment and stabilization.    Principal Problem: Schizoaffective disorder, [name] Diagnosis:   Patient Active Problem List   Diagnosis Date Noted  . Schizoaffective disorder, [name] [F25.0] 02/08/2017  . Adjustment disorder with disturbance of emotion [F43.29] 12/13/2016  . Agitation [R45.1]    . Mood disorder (HCC) [F39]   . Affective psychosis, bipolar (HCC) [F31.9]   . Bipolar affective disorder, currently manic, severe, with psychotic features (HCC) [F31.2] 09/24/2014  . Homicidal ideation [R45.850]   . Bipolar disorder, current episode manic w/o psychotic features, severe (HCC) [F31.13] 08/21/2014  . Cannabis use disorder, moderate, dependence (HCC) [F12.20] 08/21/2014  . Tobacco use disorder [F17.200] 08/21/2014   Total Time spent with patient: 20 minutes  Past Psychiatric History:   Past Medical History:  Past Medical History:  Diagnosis Date  . Anxiety   . Bipolar affective (HCC)   . OCD (obsessive compulsive disorder)    History reviewed. No pertinent surgical history. Family History:  Family History  Problem Relation Age of Onset  . Drug abuse Father   . Mental illness Other    Family Psychiatric  History:  Social History:  Social History   Substance and Sexual Activity  Alcohol Use Yes   Comment: socially     Social History   Substance and Sexual Activity  Drug Use Yes  . Types: Marijuana   Comment: occasionally    Social History   Socioeconomic History  . Marital status: Single    Spouse name: Not on file  . Number of children: Not on file  . Years of education: Not on file  . Highest education level: Not on file  Occupational History  . Not on file  Social Needs  . Financial resource strain: Not on file  . Food insecurity:    Worry: Not on file    Inability: Not on file  . Transportation needs:    Medical: Not on file    Non-medical: Not on file  Tobacco Use  . Smoking status: Current Every Day Smoker    Packs/day: 1.00    Years: 1.00    Pack years: 1.00    Types: Cigarettes  . Smokeless tobacco: Never Used  Substance and Sexual Activity  . Alcohol use: Yes    Comment: socially  . Drug use: Yes    Types: Marijuana    Comment: occasionally  . Sexual activity: Never    Birth control/protection: None  Lifestyle  .  Physical activity:    Days per week: Not on file    Minutes per session: Not on file  . Stress: Not on file  Relationships  . Social connections:    Talks on phone: Not on file    Gets together: Not on file    Attends religious service: Not on file    Active member of club or organization: Not on file    Attends meetings of clubs or organizations: Not on file    Relationship status: Not on file  Other Topics Concern  . Not on file  Social History Narrative  . Not on file   Additional Social History:                         Sleep: Fair  Appetite:  Fair  Current Medications: Current Facility-Administered Medications  Medication Dose Route Frequency Provider Last Rate Last Dose  . acetaminophen (TYLENOL) tablet 650 mg  650 mg Oral Q6H PRN Laveda Abbe, NP      . alum & mag hydroxide-simeth (MAALOX/MYLANTA) 200-200-20 MG/5ML suspension 30 mL  30 mL Oral Q4H PRN Laveda Abbe, NP      . asenapine (SAPHRIS) sublingual tablet 10 mg  10 mg Sublingual BID Micheal Likens, MD   10 mg at 07/09/17 0738  . benztropine (COGENTIN) tablet 1 mg  1 mg Oral BID PRN Micheal Likens, MD       Or  . benztropine mesylate (COGENTIN) injection 1 mg  1 mg Intramuscular BID PRN Micheal Likens, MD   1 mg at 07/07/17 0945  . chlorproMAZINE (THORAZINE) tablet 50 mg  50 mg Oral Q6H PRN Micheal Likens, MD       Or  . chlorproMAZINE (THORAZINE) injection 50 mg  50 mg Intramuscular Q6H PRN Micheal Likens, MD      . hydrOXYzine (ATARAX/VISTARIL) tablet 50 mg  50 mg Oral Q6H PRN Micheal Likens, MD   50 mg at 07/06/17 1954  . ibuprofen (ADVIL,MOTRIN) tablet 600 mg  600 mg Oral Q8H PRN Laveda Abbe, NP      . lithium carbonate (ESKALITH) CR tablet 450 mg  450 mg Oral Q12H Laveda Abbe, NP   450 mg at 07/09/17 0981  . LORazepam (ATIVAN) tablet 1 mg  1 mg Oral Once Nira Conn A, NP      . magnesium hydroxide  (MILK OF MAGNESIA) suspension 30 mL  30 mL Oral Daily PRN Laveda Abbe, NP   30 mL at 07/06/17 2208  . nicotine (NICODERM CQ - dosed in mg/24 hours) patch 21 mg  21 mg Transdermal Daily Laveda Abbe, NP   21 mg at 07/08/17 0940  . OLANZapine (ZYPREXA) tablet 2.5 mg  2.5 mg Oral BID Oneta Rack, NP      . ondansetron [facility]) tablet 4 mg  4 mg Oral Q8H PRN Laveda Abbe, NP      . traZODone (DESYREL) tablet  100 mg  100 mg Oral QHS PRN,MR X 1 Micheal Likens, MD   100 mg at 07/08/17 2307    Lab Results:  No results found for this or any previous visit (from the past 48 hour(s)).  Blood Alcohol level:  Lab Results  Component Value Date   ETH <10 07/05/2017   ETH <10 02/06/2017    Metabolic Disorder Labs: Lab Results  Component Value Date   HGBA1C 6.5 (H) 09/30/2014   MPG 140 09/30/2014   MPG 134 (H) 12/26/2012   Lab Results  Component Value Date   PROLACTIN 150.0 (H) 08/22/2014   Lab Results  Component Value Date   CHOL 137 09/30/2014   TRIG 98 09/30/2014   HDL 37 (L) 09/30/2014   CHOLHDL 3.7 09/30/2014   VLDL 20 09/30/2014   LDLCALC 80 09/30/2014    Physical Findings: AIMS:  , ,  ,  ,    CIWA:    COWS:     Musculoskeletal: Strength & Muscle Tone: within normal limits Gait & Station: normal Patient leans: N/A  Psychiatric Specialty Exam: Physical Exam  Constitutional: She appears well-developed.  Psychiatric: She has a normal mood and affect. Her behavior is normal.    Review of Systems  Psychiatric/Behavioral: Positive for hallucinations. Negative for suicidal ideas. The patient is nervous/anxious.   All other systems reviewed and are negative.   Blood pressure 133/83, pulse (!) 113, temperature 98.2 F (36.8 C), temperature source Oral, resp. rate 20, height  (1.575 m), weight 83.5 kg (184 lb), SpO2 100 %.Body mass index is 33.65 kg/m.  General Appearance: Casual hyperverbal  Eye Contact:  Good  Speech:  Clear  and Coherent and Pressured  Volume:  Normal  Mood:  Anxious and Irritable  Affect:  Congruent and Labile  Thought Process:  Coherent  Orientation:  Full (Time, Place, and Person)  Thought Content:  Hallucinations: Auditory, Paranoid Ideation and Rumination  Suicidal Thoughts:  No  Homicidal Thoughts:  No  Memory:  Immediate;   Fair Recent;   Fair Remote;   Fair  Judgement:  Fair  Insight:  Fair  Psychomotor Activity:  Normal  Concentration:  Concentration: Fair  Recall:  [employer] of Knowledge:  Fair  Language:  Good  Akathisia:  No  Handed:  Right  AIMS (if indicated):     Assets:  Communication Skills Desire for Improvement Resilience Social Support  ADL's:  Intact  Cognition:  WNL  Sleep:  Number of Hours: 2.5     Treatment Plan Summary: Daily contact with patient to assess and evaluate symptoms and progress in treatment and Medication management .  Continue with current treatment plan listed below except were noted on 07/09/2017.  Mood stabilization:       - Continue Saphris 10 mg po BID    -Increased Lithium CR 450 mg po bid to 600 mg PO BID    - Start Zyprexa 2.5mg  PO BID   EPS.     - Benztropine 1 mg po bid prn      Or     - Benztropine 1 mg IM bid prn.  Anxiety.      - Continue Hydroxyzine 50 mg po Q 6 hours prn.  Nicotine withdrawal symptoms.      - Continue Nicoderm 21 mg topically Q 24 hours.  Insomnia.      - Continue Trazodone 100 mg po Q hs prn, may repeat x 1.  Agitation/psychosis       -  Continue Thorazine 50 mg po Q 6 hours prn.         Or in unable to take orally,       -  Continue Thorazine injection 50 mg IM Q 6 hours prn  Labs reviewed: Li level 06.7 wnl   - EKG pending results   - Continue to encourage group attendance. - Discharge disposition on going.    Oneta Rack, NP 07/09/2017, 10:35 AM   ..Agree with NP Progress Note

## 2017-07-09 NOTE — [facility] Group Notes (Addendum)
[facility] LCSW Group Therapy Note  Date/Time:  07/09/2017 11AM-12PM  Type of Therapy and Topic:  Group Therapy:  Healthy and Unhealthy Supports  Participation Level:  Active   Description of Group:  Patients in this group were introduced to the idea of adding a variety of healthy supports to address the various needs in their lives.Patients discussed what additional healthy supports could be helpful in their recovery and wellness after discharge in order to prevent future hospitalizations.   An emphasis was placed on using counselor, doctor, therapy groups, 12-step groups, and problem-specific support groups to expand supports.  They also worked as a group on developing a specific plan for several patients to deal with unhealthy supports through boundary-setting, psychoeducation with loved ones, and even termination of relationships.   Therapeutic Goals:   1)  discuss importance of adding supports to stay well once out of the hospital  2)  compare healthy versus unhealthy supports and identify some examples of each  3)  generate ideas and descriptions of healthy supports that can be added  4)  offer mutual support about how to address unhealthy supports  5)  encourage active participation in and adherence to discharge plan    Summary of Patient Progress:  The patient stated that current healthy supports in her life are the people at [facility], [facility] and her mother. Her current unhealthy supports include "bad friends". Pt. Left group early due to causing interruptions.    Therapeutic Modalities:   Motivational Interviewing Brief Solution-Focused Therapy  Johny Shears, [address]

## 2017-07-09 NOTE — Progress Notes (Signed)
Nursing 1:1 note D:Pt observed lying in bed with eyes closed. RR even and unlabored. No distress noted. A: 1:1 observation continues for safety  R: pt remains safe  

## 2017-07-09 NOTE — Progress Notes (Signed)
[name], female   DOB: 1975/06/19, 42 y.o.   MRN: [id number] 1:1 Note   Pt is still very manic with pressured speech. Pt continue to deny any anxiety, depression, AVH or pain; "you all don't just understand me." Pt continue to be aggressive verbally. Patient continues needing constant redirection and education due to intrusive behaviors. Pt at this time is in the dayroom interacting with 1:1 staff. 1:1 staff is present with Pt at this time. 1:1 monitoring continues for Pt's safety.

## 2017-07-09 NOTE — Progress Notes (Signed)
[name], female   DOB: 06-22-1975, 42 y.o.   MRN: [id number]   D: Pt in room sleep, sitter at bedside. No physical distress noted. Pt reported on her self inventory sheet that her depression was a 3, her hopelessness was a 6, and her anxiety was a 3. Pt reported that she just wanted to be discharged.  A: 1:1 continued for patient safety. R: Pts safety maintained.

## 2017-07-09 NOTE — Progress Notes (Signed)
[name], female   DOB: Dec 19, 1975, 42 y.o.   MRN: [id number] Pt at this time in the dayroom interacting with 1:1 staff. Pt does not look to be in any acute distress. 1:1 monitoring continues for Pt's safety. 15-minute safety checks also continues at this time.

## 2017-07-09 NOTE — Progress Notes (Signed)
[name], female   DOB: 1975/12/24, 42 y.o.   MRN: [id number]    D: Pt in the dayroom with sitter. Pt continues to have boundary issues and remains very intrusive. Pt continues to require frequent redirection regarding her boundaries and her being inappropriate. Pt has no insight and is not vested in treatment. Pt reported being negative SI/HI, no AH/VH noted. Pt was seen by Alcario Drought NP, new orders noted. Orders was noted for Zyprexa, pt refused to take. Tanika NP was made aware. A: 1:1 continued for patient safety. R: Pts safety maintained.

## 2017-07-10 MED ORDER — FLUPHENAZINE HCL 2.5 MG PO TABS
2.5000 mg | ORAL_TABLET | Freq: Two times a day (BID) | ORAL | Status: DC
  Administered 2017-07-10 – 2017-07-11 (×3): 2.5 mg via ORAL
  Filled 2017-07-10 (×5): qty 1

## 2017-07-10 NOTE — Tx Team (Signed)
Interdisciplinary Treatment and Diagnostic Plan Update  07/10/2017 Time of Session: 10:26 AM  [name] MRN: [id number]  Principal Diagnosis: Schizoaffective disorder, bipolar type ([place])  Secondary Diagnoses: Principal Problem:   Schizoaffective disorder, bipolar type ([place])   Current Medications:  Current Facility-Administered Medications  Medication Dose Route Frequency Provider Last Rate Last Dose  . acetaminophen (TYLENOL) tablet 650 mg  650 mg Oral Q6H PRN Ethelene Hal, NP      . alum & mag hydroxide-simeth (MAALOX/MYLANTA) 200-200-20 MG/5ML suspension 30 mL  30 mL Oral Q4H PRN Ethelene Hal, NP      . asenapine (SAPHRIS) sublingual tablet 10 mg  10 mg Sublingual BID Pennelope Bracken, MD   10 mg at 07/10/17 0834  . benztropine (COGENTIN) tablet 1 mg  1 mg Oral BID PRN Pennelope Bracken, MD   1 mg at 07/10/17 0502   Or  . benztropine mesylate (COGENTIN) injection 1 mg  1 mg Intramuscular BID PRN Pennelope Bracken, MD   1 mg at 07/07/17 0945  . chlorproMAZINE (THORAZINE) tablet 50 mg  50 mg Oral Q6H PRN Pennelope Bracken, MD   50 mg at 07/10/17 0502   Or  . chlorproMAZINE (THORAZINE) injection 50 mg  50 mg Intramuscular Q6H PRN Pennelope Bracken, MD      . hydrOXYzine (ATARAX/VISTARIL) tablet 50 mg  50 mg Oral Q6H PRN Pennelope Bracken, MD   50 mg at 07/09/17 2103  . ibuprofen (ADVIL,MOTRIN) tablet 600 mg  600 mg Oral Q8H PRN Ethelene Hal, NP      . lithium carbonate (LITHOBID) CR tablet 600 mg  600 mg Oral Q12H Derrill Center, NP   600 mg at 07/10/17 0834  . LORazepam (ATIVAN) tablet 1 mg  1 mg Oral Once Lindon Romp A, NP      . LORazepam (ATIVAN) tablet 1 mg  1 mg Oral Q6H PRN Derrill Center, NP      . magnesium hydroxide (MILK OF MAGNESIA) suspension 30 mL  30 mL Oral Daily PRN Ethelene Hal, NP   30 mL at 07/06/17 2208  . nicotine (NICODERM CQ - dosed in mg/24 hours) patch 21 mg  21 mg Transdermal  Daily Ethelene Hal, NP   21 mg at 07/10/17 0835  . OLANZapine (ZYPREXA) tablet 2.5 mg  2.5 mg Oral BID Derrill Center, NP   2.5 mg at 07/10/17 0834  . ondansetron (ZOFRAN) tablet 4 mg  4 mg Oral Q8H PRN Ethelene Hal, NP      . traZODone (DESYREL) tablet 100 mg  100 mg Oral QHS PRN,MR X 1 Pennelope Bracken, MD   100 mg at 07/09/17 2245    PTA Medications: Medications Prior to Admission  Medication Sig Dispense Refill Last Dose  . asenapine (SAPHRIS) 5 MG SUBL 24 hr tablet Place 1 tablet (5 mg total) under the tongue 2 (two) times daily. For mood control 60 tablet 0 unknown  . benztropine (COGENTIN) 1 MG tablet Take 1 tablet (1 mg total) by mouth at bedtime. For prevention of drug induced tremors (Patient taking differently: Take 1 mg by mouth 2 (two) times daily. For prevention of drug induced tremors) 30 tablet 0 unknown  . Brexpiprazole (REXULTI) 4 MG TABS Take 2 mg by mouth 2 (two) times daily.   07/05/2017  . lithium carbonate (ESKALITH) 450 MG CR tablet Take 1 tablet (450 mg total) by mouth every 12 (twelve) hours. For mood stabilization  60 tablet 0 unknown  . LORazepam (ATIVAN) 1 MG tablet Take 1 tablet (1 mg total) by mouth 2 (two) times daily. For severe anxiety/agitation 14 tablet 0 unknown  . traZODone (DESYREL) 100 MG tablet Take 1 tablet (100 mg total) by mouth at bedtime. For sleep 30 tablet 0 unknown    Patient Stressors:    Patient Strengths:    Treatment Modalities: Medication Management, Group therapy, Case management,  1 to 1 session with clinician, Psychoeducation, Recreational therapy.   Physician Treatment Plan for Primary Diagnosis: Schizoaffective disorder, bipolar type ([place]) Long Term Goal(s): Improvement in symptoms so as ready for discharge  Short Term Goals: Compliance with prescribed medications will improve Ability to maintain clinical measurements within normal limits will improve  Medication Management: Evaluate patient's response,  side effects, and tolerance of medication regimen.  Therapeutic Interventions: 1 to 1 sessions, Unit Group sessions and Medication administration.  Evaluation of Outcomes: Progressing  Physician Treatment Plan for Secondary Diagnosis: Principal Problem:   Schizoaffective disorder, bipolar type ([place])   Long Term Goal(s): Improvement in symptoms so as ready for discharge  Short Term Goals: Compliance with prescribed medications will improve Ability to maintain clinical measurements within normal limits will improve  Medication Management: Evaluate patient's response, side effects, and tolerance of medication regimen.  Therapeutic Interventions: 1 to 1 sessions, Unit Group sessions and Medication administration.  Evaluation of Outcomes: Progressing   RN Treatment Plan for Primary Diagnosis: Schizoaffective disorder, bipolar type ([place]) Long Term Goal(s): Knowledge of disease and therapeutic regimen to maintain health will improve  Short Term Goals: Ability to identify and develop effective coping behaviors will improve and Compliance with prescribed medications will improve  Medication Management: RN will administer medications as ordered by provider, will assess and evaluate patient's response and provide education to patient for prescribed medication. RN will report any adverse and/or side effects to prescribing provider.  Therapeutic Interventions: 1 on 1 counseling sessions, Psychoeducation, Medication administration, Evaluate responses to treatment, Monitor vital signs and CBGs as ordered, Perform/monitor CIWA, COWS, AIMS and Fall Risk screenings as ordered, Perform wound care treatments as ordered.  Evaluation of Outcomes: Progressing   LCSW Treatment Plan for Primary Diagnosis: Schizoaffective disorder, bipolar type ([place]) Long Term Goal(s): Safe transition to appropriate next level of care at discharge, Engage patient in therapeutic group addressing interpersonal  concerns.  Short Term Goals: Engage patient in aftercare planning with referrals and resources  Therapeutic Interventions: Assess for all discharge needs, 1 to 1 time with Social worker, Explore available resources and support systems, Assess for adequacy in community support network, Educate family and significant other(s) on suicide prevention, Complete Psychosocial Assessment, Interpersonal group therapy.  Evaluation of Outcomes: Met  Return home, follow up [facility] ACT team    Progress in Treatment: Attending groups: Yes Participating in groups: Yes Taking medication as prescribed: Yes Toleration medication: Yes, no side effects reported at this time Family/Significant other contact made: Yes Patient understands diagnosis: No Limited insight Discussing patient identified problems/goals with staff: Yes Medical problems stabilized or resolved: Yes Denies suicidal/homicidal ideation: Yes Issues/concerns per patient self-inventory: None Other: N/A  New problem(s) identified: None identified at this time.   New Short Term/Long Term Goal(s): "I think you can let me go home now."  Pt refused to sign confirmation of tx team meeting.  Discharge Plan or Barriers:   Reason for Continuation of Hospitalization: Mania  Medication stabilization   Estimated Length of Stay: 5/10  Attendees: Patient: [name] 07/10/2017  10:26  AM  Physician: Maris Berger, MD 07/10/2017  10:26 AM  Nursing: Sena Hitch, RN 07/10/2017  10:26 AM  RN Care Manager: Lars Pinks, RN 07/10/2017  10:26 AM  Social Worker: Ripley Fraise 07/10/2017  10:26 AM  Recreational Therapist: Winfield Cunas 07/10/2017  10:26 AM  Other: Norberto Sorenson 07/10/2017  10:26 AM  Other:  07/10/2017  10:26 AM    Scribe for Treatment Team:  Roque Lias LCSW 07/10/2017 10:26 AM

## 2017-07-10 NOTE — Progress Notes (Signed)
Pt up arguing that writer was not listening to her. Pt was arguing because another patient was arguing about his pain medication and was stating she was being disrespected.

## 2017-07-10 NOTE — Progress Notes (Signed)
[facility] MD Progress Note  07/10/2017 2:17 PM [name]  MRN:  [id number] Subjective:    [name] is a 42 y/o F with history of schizoaffective disorder bipolar type who was admitted from [facility] on IVC after she presented brought in by her mother with worsening symptoms of agitation, paranoia, insomnia, delusions, and medication non-adherence. Pt's mother provided collateral that pt has been off of her psychotropic medications for the past 4 weeks; pt has an outpatient ACT team. In the ED, she had made bizarre statements such as that she arrived by to the ED by plane crash or had driven through fire to get to the ED. She had poor insight and seemed internally preoccupied in the ED. She was placed on IVC and transferred to [facility] for additional treatment and stabilization. She was started on saphris and lithium, both of which have been titrated up during her stay. She was placed on 1:1 due to ongoing intrusive behaviors. She was started on trial of zyprexa yesterday due to ongoing intrusive behaviors.  Today upon evaluation, pt remains irritable, pressured, and mostly uncooperative with the interview. She shares, "I'm a little bit agitated. My ACT team is not acting right." Pt perseverates on being able to pay her bills. She has poor insight, and reflects that "my mother is the only reason I'm in here." She denies physical complaints. She is sleeping poorly, with only 1.75h recorded by RN staff last night. She denies SI/HI/AH/VH. She reports that zyprexa causes her to feel "like ants are crawling all over my body." Discussed with patient about changing to an alternative medication from zyprexa, and she initially was in agreement to trial of prolixin. Pt later returned to this provider and stated she would not take any additional medication. We will plan to continue prolixin at this time with possible transition to long-acting injectable form of medication.  Principal Problem: Schizoaffective disorder,  [name] Diagnosis:   Patient Active Problem List   Diagnosis Date Noted  . Schizoaffective disorder, [name] [F25.0] 02/08/2017  . Adjustment disorder with disturbance of emotion [F43.29] 12/13/2016  . Agitation [R45.1]   . Mood disorder (HCC) [F39]   . Affective psychosis, bipolar (HCC) [F31.9]   . Bipolar affective disorder, currently manic, severe, with psychotic features (H[facility]) [F31.2] 09/24/2014  . Homicidal ideation [R45.850]   . Bipolar disorder, current episode manic w/o psychotic features, severe (HCC) [F31.13] 08/21/2014  . Cannabis use disorder, moderate, dependence (HCC) [F12.20] 08/21/2014  . Tobacco use disorder [F17.200] 08/21/2014   Total Time spent with patient: 30 minutes  Past Psychiatric History: see H&P  Past Medical History:  Past Medical History:  Diagnosis Date  . Anxiety   . Bipolar affective (HCC)   . OCD (obsessive compulsive disorder)    History reviewed. No pertinent surgical history. Family History:  Family History  Problem Relation Age of Onset  . Drug abuse Father   . Mental illness Other    Family Psychiatric  History: see H&P Social History:  Social History   Substance and Sexual Activity  Alcohol Use Yes   Comment: socially     Social History   Substance and Sexual Activity  Drug Use Yes  . Types: Marijuana   Comment: occasionally    Social History   Socioeconomic History  . Marital status: Single    Spouse name: Not on file  . Number of children: Not on file  . Years of education: Not on file  . Highest education level:  Not on file  Occupational History  . Not on file  Social Needs  . Financial resource strain: Not on file  . Food insecurity:    Worry: Not on file    Inability: Not on file  . Transportation needs:    Medical: Not on file    Non-medical: Not on file  Tobacco Use  . Smoking status: Current Every Day Smoker    Packs/day: 1.00    Years: 1.00    Pack years: 1.00    Types:  Cigarettes  . Smokeless tobacco: Never Used  Substance and Sexual Activity  . Alcohol use: Yes    Comment: socially  . Drug use: Yes    Types: Marijuana    Comment: occasionally  . Sexual activity: Never    Birth control/protection: None  Lifestyle  . Physical activity:    Days per week: Not on file    Minutes per session: Not on file  . Stress: Not on file  Relationships  . Social connections:    Talks on phone: Not on file    Gets together: Not on file    Attends religious service: Not on file    Active member of club or organization: Not on file    Attends meetings of clubs or organizations: Not on file    Relationship status: Not on file  Other Topics Concern  . Not on file  Social History Narrative  . Not on file   Additional Social History:                         Sleep: Poor  Appetite:  Fair  Current Medications: Current Facility-Administered Medications  Medication Dose Route Frequency Provider Last Rate Last Dose  . acetaminophen (TYLENOL) tablet 650 mg  650 mg Oral Q6H PRN Laveda Abbe, NP      . alum & mag hydroxide-simeth (MAALOX/MYLANTA) 200-200-20 MG/5ML suspension 30 mL  30 mL Oral Q4H PRN Laveda Abbe, NP      . asenapine (SAPHRIS) sublingual tablet 10 mg  10 mg Sublingual BID Micheal Likens, MD   10 mg at 07/10/17 0834  . benztropine (COGENTIN) tablet 1 mg  1 mg Oral BID PRN Micheal Likens, MD   1 mg at 07/10/17 0502   Or  . benztropine mesylate (COGENTIN) injection 1 mg  1 mg Intramuscular BID PRN Micheal Likens, MD   1 mg at 07/07/17 0945  . chlorproMAZINE (THORAZINE) tablet 50 mg  50 mg Oral Q6H PRN Micheal Likens, MD   50 mg at 07/10/17 0502   Or  . chlorproMAZINE (THORAZINE) injection 50 mg  50 mg Intramuscular Q6H PRN Micheal Likens, MD      . fluPHENAZine (PROLIXIN) tablet 2.5 mg  2.5 mg Oral BID Micheal Likens, MD      . hydrOXYzine (ATARAX/VISTARIL) tablet  50 mg  50 mg Oral Q6H PRN Micheal Likens, MD   50 mg at 07/09/17 2103  . ibuprofen (ADVIL,MOTRIN) tablet 600 mg  600 mg Oral Q8H PRN Laveda Abbe, NP      . lithium carbonate (LITHOBID) CR tablet 600 mg  600 mg Oral Q12H Oneta Rack, NP   600 mg at 07/10/17 0834  . LORazepam (ATIVAN) tablet 1 mg  1 mg Oral Once Nira Conn A, NP      . LORazepam (ATIVAN) tablet 1 mg  1 mg Oral Q6H PRN Oneta Rack, NP      .  magnesium hydroxide (MILK OF MAGNESIA) suspension 30 mL  30 mL Oral Daily PRN Laveda Abbe, NP   30 mL at 07/06/17 2208  . nicotine (NICODERM CQ - dosed in mg/24 hours) patch 21 mg  21 mg Transdermal Daily Laveda Abbe, NP   21 mg at 07/10/17 0835  . ondansetron (ZOFRAN) tablet 4 mg  4 mg Oral Q8H PRN Laveda Abbe, NP      . traZODone (DESYREL) tablet 100 mg  100 mg Oral QHS PRN,MR X 1 Micheal Likens, MD   100 mg at 07/09/17 2245    Lab Results: No results found for this or any previous visit (from the past 48 hour(s)).  Blood Alcohol level:  Lab Results  Component Value Date   ETH <10 07/05/2017   ETH <10 02/06/2017    Metabolic Disorder Labs: Lab Results  Component Value Date   HGBA1C 6.5 (H) 09/30/2014   MPG 140 09/30/2014   MPG 134 (H) 12/26/2012   Lab Results  Component Value Date   PROLACTIN 150.0 (H) 08/22/2014   Lab Results  Component Value Date   CHOL 137 09/30/2014   TRIG 98 09/30/2014   HDL 37 (L) 09/30/2014   CHOLHDL 3.7 09/30/2014   VLDL 20 09/30/2014   LDLCALC 80 09/30/2014    Physical Findings: AIMS:  , ,  ,  ,    CIWA:    COWS:     Musculoskeletal: Strength & Muscle Tone: within normal limits Gait & Station: normal Patient leans: N/A  Psychiatric Specialty Exam: Physical Exam  Nursing note and vitals reviewed.   Review of Systems  Constitutional: Negative for chills and fever.  Respiratory: Negative for cough and shortness of breath.   Cardiovascular: Negative for chest pain.   Gastrointestinal: Negative for abdominal pain, heartburn, nausea and vomiting.  Psychiatric/Behavioral: Negative for depression, hallucinations and suicidal ideas. The patient is not nervous/anxious and does not have insomnia.     Blood pressure 118/77, pulse (!) 119, temperature 97.7 F (36.5 C), temperature source Oral, resp. rate 20, height  (1.575 m), weight 83.5 kg (184 lb), SpO2 100 %.Body mass index is 33.65 kg/m.  General Appearance: Casual and Disheveled  Eye Contact:  Good  Speech:  Clear and Coherent and Pressured  Volume:  Normal  Mood:  Angry, Dysphoric and Irritable  Affect:  Blunt and Labile  Thought Process:  Coherent and Goal Directed  Orientation:  Full (Time, Place, and Person)  Thought Content:  Paranoid Ideation, Rumination and Tangential  Suicidal Thoughts:  No  Homicidal Thoughts:  No  Memory:  Immediate;   Fair Recent;   Fair Remote;   Fair  Judgement:  Poor  Insight:  Lacking  Psychomotor Activity:  Normal  Concentration:  Concentration: Fair  Recall:  [employer] of Knowledge:  Fair  Language:  Fair  Akathisia:  No  Handed:    AIMS (if indicated):     Assets:  Resilience Social Support  ADL's:  Intact  Cognition:  WNL  Sleep:  Number of Hours: 1.75    Treatment Plan Summary: Daily contact with patient to assess and evaluate symptoms and progress in treatment and Medication management   -Continue inpatient hospitalization  -Schizoaffective disorder, bipolar type   - Continue Saphris 10 mg po BID             - Continue lithium CR 600 mg PO BID               -  Discontinue Zyprexa 2.5mg  PO BID   - Start prolixin 2.5mg  po BID  -EPS.   - Benztropine 1 mg po/IM bid prn EPS  -Anxiety.   - Continue Hydroxyzine 50 mg po Q 6 hours prn anxiety.  -Nicotine withdrawal symptoms.   -Continue Nicoderm 21 mg topically Q 24 hours.  -Insomnia.   -Continue Trazodone 100 mg po Q hs prn insomnia, may repeat x  1.  -Agitation/psychosis - Continue Thorazine 50 mg po/IM Q 6 hours prn agitation.  - Continue to encourage group attendance. - Discharge disposition planning will be ongoing.  Micheal Likens, MD 07/10/2017, 2:17 PM

## 2017-07-10 NOTE — Progress Notes (Signed)
Pt continues to fight the sleep medication, pt sleeps for a little while, but jumps up then continues to try to order, argue and make people upset. Pt continues to endorse that she is leaving tomorrow and tries to provoke Clinical research associate into an argument by cursing and threatening.

## 2017-07-10 NOTE — Progress Notes (Signed)
Nursing 1:1 note D:Pt observed sleeping in bed with eyes closed. RR even and unlabored. No distress noted. A: 1:1 observation continues for safety  R: pt remains safe  

## 2017-07-10 NOTE — Progress Notes (Signed)
D: Pt observed interacting with assigned staff in dayroom at this time. Continues to pace hall restlessly, stare at peers intensely then ask "why you looking at me like that for?" will not stay in bed even after telling staff "I need to go sleep, I'm going to take a nap now" and pt was sleepy at the time. Remains hyperactive, intrusive and impulsive towards staff and peers. Delusional / disorganized as well; believes staff are poisoning her "you and your damn doctor keep poisoning me, those medicines are poison bitch". A: Continued support and availability provided to pt. 1:1 observation maintained with assigned staff in attendance at all times. Encouraged pt to voice concerns and comply with current treatment regimen.  R: Pt continue to need verbal redirections. Remains safe on and off unit (to get her food and back to unit).

## 2017-07-10 NOTE — [facility] Group Notes (Signed)
LCSW Group Therapy Note   07/10/2017 1:15pm   Type of Therapy and Topic:  Group Therapy:  Overcoming Obstacles   Participation Level:  Active   Description of Group:    In this group patients will be encouraged to explore what they see as obstacles to their own wellness and recovery. They will be guided to discuss their thoughts, feelings, and behaviors related to these obstacles. The group will process together ways to cope with barriers, with attention given to specific choices patients can make. Each patient will be challenged to identify changes they are motivated to make in order to overcome their obstacles. This group will be process-oriented, with patients participating in exploration of their own experiences as well as giving and receiving support and challenge from other group members.   Therapeutic Goals: 1. Patient will identify personal and current obstacles as they relate to admission. 2. Patient will identify barriers that currently interfere with their wellness or overcoming obstacles.  3. Patient will identify feelings, thought process and behaviors related to these barriers. 4. Patient will identify two changes they are willing to make to overcome these obstacles:      Summary of Patient Progress   Stayed the entire time, engaged throughout.  Identified her mother as her biggest obstacle.  While externalizing problems, she was still willing to identify some things she could do for herself when she feels her mother is too intrusive.  Continues to be somewhat instrusive, but is redirectable. Also cited her ACT team as a support, as well as [facility].   Therapeutic Modalities:   Cognitive Behavioral Therapy Solution Focused Therapy Motivational Interviewing Relapse Prevention Therapy  Ida Rogue, LCSW 07/10/2017 3:40 PM

## 2017-07-10 NOTE — Progress Notes (Signed)
Recreation Therapy Notes  Date: 5.6.19 Time: 1000 Location: 500 Hall Dayroom  Group Topic: Anger Management  Goal Area(s) Addresses:  Patient will identify triggers for anger.  Patient will identify physical reaction to anger.   Patient will identify benefit of using coping skills when angry.  Behavioral Response: Engaged, Drowsy  Intervention: Worksheet  Activity: Intro to Anger Management.  Patients were given a worksheet in which they were to identify what causes their anger, their reaction to anger and any problems they have encountered because of anger.  Education: Anger Management, Discharge Planning   Education Outcome: Acknowledges education/In group clarification offered/Needs additional education.   Clinical Observations/Feedback: Pt was appropriate during group but kept dozing off.  Pt stated depending on her mom to pay her bills while she is here, the reasons she keeps coming to the hospital and repeating herself lead to her anger.  Pt expressed she "snap, scream, shout or walk away" when angry.  Pt stated her anger problems has caused her to have court cases, charges and trouble with the police.      Caroll Rancher, LRT/CTRS          Karisma, Meiser A 07/10/2017 11:24 AM

## 2017-07-10 NOTE — Progress Notes (Signed)
Pt continues to walk around very drowsy unsteady without regard for her safety. Pt was told to lay down or stay seated due to her being drowsy, but pt does not comply. Pt continues to be verbally aggressive.

## 2017-07-10 NOTE — Progress Notes (Signed)
Pt back and forth at the nursing station very argumentative talking about her mom coming to pick her up. Pt informed she could not leave until the doctor D/C her and the doctor would not be in until later and that would  Not be any guarantee that she will be leaving. Pt continues to express having to do things later today being the reason she is leaving later in the day.

## 2017-07-10 NOTE — Progress Notes (Signed)
D: Pt visible in hall on initial approach. Presents irritable / labile, intrusive on interactions with loud, pressured speech. Presents verbally hostile / abusive at intervals but is redirectable. Denies SI, HI, AVH and pain "no, bitch". Rates her depression, anxiety and hopelessness all 5/10 on self inventory sheet. Reports she's sleeping well with good appetite, normal energy and good concentration level.  Pt's goal for today is "to be discharge". A: Introduced self to pt. Emotional support and availability provided to pt. All medications administered with verbal education and effects monitored. 1:1 observation continues as ordered without outburst or self harm gestures at this time.   R: Pt receptive to care. Pt attended group this AM, verbally engaged in discussion. Compliant with medications when offered. Denies adverse drug reactions when assessed this shift. Tolerates all PO intake well. POC maintained for safety and mood stability.

## 2017-07-10 NOTE — Progress Notes (Signed)
Did not attend group 

## 2017-07-10 NOTE — Progress Notes (Signed)
D: Pt is awake in dayroom eating dinner and conversing with peer in dayroom at this time. Went off unit to get her food and returned without issues. Took her evening medications without problems.  A: Assigned staff in attendance at all times. 1:1 observation maintained without events thus far. Continued support and encouragement provided to pt throughout this shift. R: Pt is more redirectable at present, however, her mood remains labile. Safety maintained on unit.

## 2017-07-11 MED ORDER — FLUPHENAZINE HCL 2.5 MG PO TABS
2.5000 mg | ORAL_TABLET | Freq: Every day | ORAL | Status: DC
  Administered 2017-07-12: 2.5 mg via ORAL
  Filled 2017-07-11 (×2): qty 1

## 2017-07-11 MED ORDER — FLUPHENAZINE DECANOATE 25 MG/ML IJ SOLN
12.5000 mg | INTRAMUSCULAR | Status: DC
  Administered 2017-07-11: 12.5 mg via INTRAMUSCULAR
  Filled 2017-07-11: qty 0.5

## 2017-07-11 NOTE — Progress Notes (Addendum)
Recreation Therapy Notes  Date: 5.7.19 Time: 0945 Location: 500 Hall Dayroom  Group Topic: Wellness  Goal Area(s) Addresses:  Patient will define components of whole wellness. Patient will verbalize benefit of whole wellness.  Behavioral Response: Engaged  Intervention: Music  Activity: Exercise.  LRT led group in four rounds of exercises.  LRT let each patient pick an exercise to lead the group in.  Education:Wellness, Discharge Planning.   Education Outcome: Acknowledges education/In group clarification offered/Needs additional education.   Clinical Observations/Feedback:  Pt was very active and smiling during group.  Pt was very engaged and completed each exercise but took breaks when she needed to.    Caroll Rancher, LRT/CTRS          Lillia Abed, Laurisa Sahakian A 07/11/2017 10:53 AM

## 2017-07-11 NOTE — Progress Notes (Signed)
[employer] MD Progress Note  07/11/2017 11:33 AM [name]  MRN:  [id number] Subjective:    [name] is a 42 y/o F with history of schizoaffective disorder bipolar type who was admitted from [facility] on IVC after she presented brought in by her mother with worsening symptoms of agitation, paranoia, insomnia, delusions, and medication non-adherence. Pt's mother provided collateral that pt has been off of her psychotropic medications for the past 4 weeks; pt has an outpatient ACT team. In the ED, she had made bizarre statements such as that she arrived by to the ED by plane crash or had driven through fire to get to the ED. She had poor insight and seemed internally preoccupied in the ED. She was placed on IVC and transferred to [facility] for additional treatment and stabilization. She was started on saphris and lithium, both of which have been titrated up during her stay. She was placed on 1:1 due to ongoing intrusive behaviors. She was started on trial of zyprexa due to ongoing intrusive behaviors, but she noted that it gave her feeling that her "skin was crawling," so she was changed to prolixin, which she has been tolerating well. Pt has been demonstrating incremental improvement of her presenting symptoms.  Today upon evaluation, pt is mildly irritable but she has significant improvement in her ability to participate in the interview. She shares, "I'm doing good - just need to get home so I can take care of some stuff." She reports that she is sleeping well despite RN staff only recording under 4 hours of sleep last night. Her appetite is good. She denies physical complaints. She denies SI/HI/AH/VH. She feels that her medications have been helpful and she denies side effects. Discussed with patient about benefit of transitioning to a long-acting injectable form of her medications, and pt is in agreement to trial of prolixin decanoate. She requests to discharge to home as soon as possible, and we will  discontinue her 1:1 with staff as she has been appropriate on the unit.  Principal Problem: Schizoaffective disorder, [name] Diagnosis:   Patient Active Problem List   Diagnosis Date Noted  . Schizoaffective disorder, [name] [F25.0] 02/08/2017  . Adjustment disorder with disturbance of emotion [F43.29] 12/13/2016  . Agitation [R45.1]   . Mood disorder (HCC) [F39]   . Affective psychosis, bipolar (HCC) [F31.9]   . Bipolar affective disorder, currently manic, severe, with psychotic features (HCC) [F31.2] 09/24/2014  . Homicidal ideation [R45.850]   . Bipolar disorder, current episode manic w/o psychotic features, severe (HCC) [F31.13] 08/21/2014  . Cannabis use disorder, moderate, dependence (HCC) [F12.20] 08/21/2014  . Tobacco use disorder [F17.200] 08/21/2014   Total Time spent with patient: 30 minutes  Past Psychiatric History: see H&P  Past Medical History:  Past Medical History:  Diagnosis Date  . Anxiety   . Bipolar affective (HCC)   . OCD (obsessive compulsive disorder)    History reviewed. No pertinent surgical history. Family History:  Family History  Problem Relation Age of Onset  . Drug abuse Father   . Mental illness Other    Family Psychiatric  History: see H&P Social History:  Social History   Substance and Sexual Activity  Alcohol Use Yes   Comment: socially     Social History   Substance and Sexual Activity  Drug Use Yes  . Types: Marijuana   Comment: occasionally    Social History   Socioeconomic History  . Marital status: Single    Spouse  name: Not on file  . Number of children: Not on file  . Years of education: Not on file  . Highest education level: Not on file  Occupational History  . Not on file  Social Needs  . Financial resource strain: Not on file  . Food insecurity:    Worry: Not on file    Inability: Not on file  . Transportation needs:    Medical: Not on file    Non-medical: Not on file  Tobacco Use   . Smoking status: Current Every Day Smoker    Packs/day: 1.00    Years: 1.00    Pack years: 1.00    Types: Cigarettes  . Smokeless tobacco: Never Used  Substance and Sexual Activity  . Alcohol use: Yes    Comment: socially  . Drug use: Yes    Types: Marijuana    Comment: occasionally  . Sexual activity: Never    Birth control/protection: None  Lifestyle  . Physical activity:    Days per week: Not on file    Minutes per session: Not on file  . Stress: Not on file  Relationships  . Social connections:    Talks on phone: Not on file    Gets together: Not on file    Attends religious service: Not on file    Active member of club or organization: Not on file    Attends meetings of clubs or organizations: Not on file    Relationship status: Not on file  Other Topics Concern  . Not on file  Social History Narrative  . Not on file   Additional Social History:                         Sleep: Fair  Appetite:  Good  Current Medications: Current Facility-Administered Medications  Medication Dose Route Frequency Provider Last Rate Last Dose  . acetaminophen (TYLENOL) tablet 650 mg  650 mg Oral Q6H PRN Laveda Abbe, NP      . alum & mag hydroxide-simeth (MAALOX/MYLANTA) 200-200-20 MG/5ML suspension 30 mL  30 mL Oral Q4H PRN Laveda Abbe, NP      . asenapine (SAPHRIS) sublingual tablet 10 mg  10 mg Sublingual BID Micheal Likens, MD   10 mg at 07/11/17 0835  . benztropine (COGENTIN) tablet 1 mg  1 mg Oral BID PRN Micheal Likens, MD   1 mg at 07/10/17 0502   Or  . benztropine mesylate (COGENTIN) injection 1 mg  1 mg Intramuscular BID PRN Micheal Likens, MD   1 mg at 07/07/17 0945  . chlorproMAZINE (THORAZINE) tablet 50 mg  50 mg Oral Q6H PRN Micheal Likens, MD   50 mg at 07/10/17 0502   Or  . chlorproMAZINE (THORAZINE) injection 50 mg  50 mg Intramuscular Q6H PRN Micheal Likens, MD      . Melene Muller ON  07/12/2017] fluPHENAZine (PROLIXIN) tablet 2.5 mg  2.5 mg Oral QHS Micheal Likens, MD      . fluPHENAZine decanoate (PROLIXIN) injection 12.5 mg  12.5 mg Intramuscular Q14 Days Jolyne Loa T, MD   12.5 mg at 07/11/17 1029  . hydrOXYzine (ATARAX/VISTARIL) tablet 50 mg  50 mg Oral Q6H PRN Micheal Likens, MD   50 mg at 07/10/17 2340  . ibuprofen (ADVIL,MOTRIN) tablet 600 mg  600 mg Oral Q8H PRN Laveda Abbe, NP      . lithium carbonate (LITHOBID) CR tablet 600 mg  600 mg Oral Q12H Oneta Rack, NP   600 mg at 07/11/17 0835  . LORazepam (ATIVAN) tablet 1 mg  1 mg Oral Once Nira Conn A, NP      . LORazepam (ATIVAN) tablet 1 mg  1 mg Oral Q6H PRN Oneta Rack, NP   1 mg at 07/10/17 2340  . magnesium hydroxide (MILK OF MAGNESIA) suspension 30 mL  30 mL Oral Daily PRN Laveda Abbe, NP   30 mL at 07/06/17 2208  . nicotine (NICODERM CQ - dosed in mg/24 hours) patch 21 mg  21 mg Transdermal Daily Laveda Abbe, NP   21 mg at 07/11/17 0836  . ondansetron (ZOFRAN) tablet 4 mg  4 mg Oral Q8H PRN Laveda Abbe, NP      . traZODone (DESYREL) tablet 100 mg  100 mg Oral QHS PRN,MR X 1 Micheal Likens, MD   100 mg at 07/10/17 2149    Lab Results: No results found for this or any previous visit (from the past 48 hour(s)).  Blood Alcohol level:  Lab Results  Component Value Date   ETH <10 07/05/2017   ETH <10 02/06/2017    Metabolic Disorder Labs: Lab Results  Component Value Date   HGBA1C 6.5 (H) 09/30/2014   MPG 140 09/30/2014   MPG 134 (H) 12/26/2012   Lab Results  Component Value Date   PROLACTIN 150.0 (H) 08/22/2014   Lab Results  Component Value Date   CHOL 137 09/30/2014   TRIG 98 09/30/2014   HDL 37 (L) 09/30/2014   CHOLHDL 3.7 09/30/2014   VLDL 20 09/30/2014   LDLCALC 80 09/30/2014    Physical Findings: AIMS:  , ,  ,  ,    CIWA:    COWS:     Musculoskeletal: Strength & Muscle Tone: within normal  limits Gait & Station: normal Patient leans: N/A  Psychiatric Specialty Exam: Physical Exam  Nursing note and vitals reviewed.   Review of Systems  Constitutional: Negative for chills and fever.  Respiratory: Negative for cough and shortness of breath.   Cardiovascular: Negative for chest pain.  Gastrointestinal: Negative for abdominal pain, heartburn, nausea and vomiting.  Psychiatric/Behavioral: Negative for depression, hallucinations and suicidal ideas. The patient is not nervous/anxious and does not have insomnia.     Blood pressure (!) 139/54, pulse (!) 103, temperature 97.7 F (36.5 C), temperature source Oral, resp. rate 20, height  (1.575 m), weight 83.5 kg (184 lb), SpO2 100 %.Body mass index is 33.65 kg/m.  General Appearance: Casual and Fairly Groomed  Eye Contact:  Good  Speech:  Clear and Coherent and Normal Rate  Volume:  Normal  Mood:  Irritable  Affect:  Appropriate, Congruent and Constricted  Thought Process:  Coherent and Goal Directed  Orientation:  Full (Time, Place, and Person)  Thought Content:  Logical  Suicidal Thoughts:  No  Homicidal Thoughts:  No  Memory:  Immediate;   Fair Recent;   Fair Remote;   Fair  Judgement:  Fair  Insight:  Lacking  Psychomotor Activity:  Normal  Concentration:  Concentration: Fair  Recall:  Fair  Fund of Knowledge:  Good  Language:  Fair  Akathisia:  No  Handed:    AIMS (if indicated):     Assets:  Resilience  ADL's:  Intact  Cognition:  WNL  Sleep:  Number of Hours: 3.25   Treatment Plan Summary: Daily contact with patient to assess and evaluate symptoms and progress in treatment  and Medication management   -Continue inpatient hospitalization  -Schizoaffective disorder, bipolar type             - Continue Saphris 10 mg po BID  - Continue lithium CR 600 mg PO BID   - Change prolixin 2.5mg  po BID to prolixin 2.5mg  po qhs   -Start prolixin decanoate 12.5mg  IM q14 days (administer  today 07/11/17)  -EPS.             -Continue Benztropine 1 mg po/IM bid prn EPS  -Anxiety.             - Continue Hydroxyzine 50 mg po Q 6 hours prn anxiety.  -Nicotine withdrawal symptoms.             -Continue Nicoderm 21 mg topically Q 24 hours.  -Insomnia.             -Continue Trazodone 100 mg po Q hs prn insomnia, may repeat x 1.  -Agitation/psychosis   - Continue Thorazine 50 mg po/IM Q 6 hours prn agitation.  - Continue to encourage group attendance. - Discharge disposition planning will be ongoing.  Micheal Likens, MD 07/11/2017, 11:33 AM

## 2017-07-11 NOTE — Progress Notes (Signed)
[facility] Post 1:1 Observation Documentation  For the first (8) hours following discontinuation of 1:1 precautions, a progress note entry by nursing staff should be documented at least every 2 hours, reflecting the patient's behavior, condition, mood, and conversation.  Use the progress notes for additional entries.  Time 1:1 discontinued:  1101  Patient's Behavior:  Pleasant  Patient's Condition:  Pt is calm and cooperative   Patient's Conversation:  Sated she is happy.  Bethann Punches 07/11/2017, 5:51 PM

## 2017-07-11 NOTE — Progress Notes (Signed)
[facility] Post 1:1 Observation Documentation  For the first (8) hours following discontinuation of 1:1 precautions, a progress note entry by nursing staff should be documented at least every 2 hours, reflecting the patient's behavior, condition, mood, and conversation.  Use the progress notes for additional entries.  Time 1:1 discontinued:  1101  Patient's Behavior:  Pt is calm  Patient's Condition:  Pt is safe, ate lunch in the cafeteria, no problem  Patient's Conversation:  Pt stated she is okay.  Bethann Punches 07/11/2017, 2:06 PM

## 2017-07-11 NOTE — Plan of Care (Signed)
  Problem: Activity: Goal: Interest or engagement in activities will improve Outcome: Progressing   Problem: Safety: Goal: Periods of time without injury will increase Outcome: Progressing  Pt attended group and participated, remains safe on the unit.

## 2017-07-11 NOTE — Plan of Care (Signed)
Nursing 1:1 note D:Pt observed sitting in bed. RR even and unlabored. No distress noted.A: 1:1 observation continues for safety  R: pt remains safe   Problem: Activity: Goal: Sleeping patterns will improve Outcome: Not Progressing   Problem: Coping: Goal: Ability to demonstrate self-control will improve Outcome: Not Progressing   Problem: Safety: Goal: Periods of time without injury will increase Outcome: Progressing

## 2017-07-11 NOTE — Progress Notes (Signed)
Pt comes out the room throughout the night very disrespectful and verbally aggressive.

## 2017-07-11 NOTE — Progress Notes (Signed)
[facility] Post 1:1 Observation Documentation  For the first (8) hours following discontinuation of 1:1 precautions, a progress note entry by nursing staff should be documented at least every 2 hours, reflecting the patient's behavior, condition, mood, and conversation.  Use the progress notes for additional entries.  Time 1:1 discontinued: 1101   Patient's Behavior:  Calm. Pleasant   Patient's Condition:  Calm and safe in the unit  Patient's Conversation:  Py happy to be off 1:1  Bethann Punches 07/11/2017, 4:45 PM

## 2017-07-11 NOTE — [facility] Group Notes (Signed)
LCSW Group Therapy Note  07/11/2017 1:15pm  Type of Therapy/Topic:  Group Therapy:  Feelings about Diagnosis  Participation Level:  Active   Description of Group:   This group will allow patients to explore their thoughts and feelings about diagnoses they have received. Patients will be guided to explore their level of understanding and acceptance of these diagnoses. Facilitator will encourage patients to process their thoughts and feelings about the reactions of others to their diagnosis and will guide patients in identifying ways to discuss their diagnosis with significant others in their lives. This group will be process-oriented, with patients participating in exploration of their own experiences, giving and receiving support, and processing challenge from other group members.   Therapeutic Goals: 1. Patient will demonstrate understanding of diagnosis as evidenced by identifying two or more symptoms of the disorder 2. Patient will be able to express two feelings regarding the diagnosis 3. Patient will demonstrate their ability to communicate their needs through discussion and/or role play  Summary of Patient Progress:  Stayed the entire time, engaged throughout.  Decreased intrusivity. Required no redirection. Talked about the hospital as a community "because people get it her."  Also talked about seeing the good in others.  "When I don't, I am angry all the time, like how I was when I came in here."     Therapeutic Modalities:   Cognitive Behavioral Therapy Brief Therapy Feelings Identification    Ida Rogue, LCSW 07/11/2017 1:42 PM

## 2017-07-11 NOTE — Progress Notes (Signed)
Nursing 1:1 note D:Pt observed sitting in dayroom RR even and unlabored. No distress noted. A: 1:1 observation continues for safety  R: pt remains safe  

## 2017-07-11 NOTE — Progress Notes (Signed)
[facility] Post 1:1 Observation Documentation  For the first (8) hours following discontinuation of 1:1 precautions, a progress note entry by nursing staff should be documented at least every 2 hours, reflecting the patient's behavior, condition, mood, and conversation.  Use the progress notes for additional entries.  Time 1:1 discontinued:  1101  Patient's Behavior:  Calm and pleasant  Patient's Condition:  Safe  Patient's Conversation:  Interacting well with both staff and peers.  Bethann Punches 07/11/2017, 7:01 PM

## 2017-07-11 NOTE — Progress Notes (Signed)
[facility] Post 1:1 Observation Documentation  For the first (8) hours following discontinuation of 1:1 precautions, a progress note entry by nursing staff should be documented at least every 2 hours, reflecting the patient's behavior, condition, mood, and conversation.  Use the progress notes for additional entries.  Time 1:1 discontinued:  1101  Patient's Behavior:  Calm and cooperative  Patient's Condition:  Pt safe in the unit  Patient's Conversation:  Pt sated she is happy being off 1:1 and interacting well with both staff and peers.  Bethann Punches 07/11/2017, 11:01 AM

## 2017-07-11 NOTE — Plan of Care (Signed)
D: Pt denies SI/HI/AVH. Pt is intrusive and labile, but is redirectable. Pt off the 1:1 and appears able to control herself most of the evening.   A: Pt was offered support and encouragement. Pt was given scheduled medications. Pt was encourage to attend groups. Q 15 minute checks were done for safety.   R: safety maintained on unit.   Problem: Education: Goal: Emotional status will improve Outcome: Progressing   Problem: Education: Goal: Mental status will improve Outcome: Progressing   Problem: Coping: Goal: Ability to demonstrate self-control will improve Outcome: Progressing

## 2017-07-11 NOTE — Progress Notes (Signed)
Nursing 1:1 note D:Pt observed sleeping in bed with eyes closed. RR even and unlabored. No distress noted. A: 1:1 observation continues for safety  R: pt remains safe  

## 2017-07-11 NOTE — [facility] Suicide Risk Assessment (Signed)
[facility] INPATIENT:  Family/Significant Other Suicide Prevention Education  Suicide Prevention Education:  Education Completed; No one has been identified by the patient as the family member/significant other with whom the patient will be residing, and identified as the person(s) who will aid the patient in the event of a mental health crisis (suicidal ideations/suicide attempt).  With written consent from the patient, the family member/significant other has been provided the following suicide prevention education, prior to the and/or following the discharge of the patient.  The suicide prevention education provided includes the following:  Suicide risk factors  Suicide prevention and interventions  National Suicide Hotline telephone number  [facility] assessment telephone number  [facility] Emergency Assistance 911  [facility] [address] and/or Residential Mobile Crisis Unit telephone number  Request made of family/significant other to:  Remove weapons (e.g., guns, rifles, knives), all items previously/currently identified as safety concern.    Remove drugs/medications (over-the-counter, prescriptions, illicit drugs), all items previously/currently identified as a safety concern.  The family member/significant other verbalizes understanding of the suicide prevention education information provided.  The family member/significant other agrees to remove the items of safety concern listed above. The patient did not endorse SI at the time of admission, nor did the patient c/o SI during the stay here.  SPE not required. However, I did talk to mother, Rae Mar, [phone], and went over crises plan and treatment team recommendations. Ida Rogue 07/11/2017, 1:53 PM

## 2017-07-12 NOTE — Plan of Care (Signed)
  Problem: Activity: Goal: Interest or engagement in activities will improve Outcome: Progressing   Problem: Safety: Goal: Periods of time without injury will increase Outcome: Progressing    DAR NOTE: Patient presents with anxious affect and labile mood.  Pt has been under the impression of being d/c to day, pt called her mother who came to pick her up. Pt has been observed in the milieu interacting well with peers and staff. Denies pain, auditory and visual hallucinations.  Rates depression at 7, hopelessness at 7, and anxiety at 6.  Maintained on routine safety checks.  Medications given as prescribed.  Support and encouragement offered as needed.  Attended group and participated.  States goal for today is "discharge." Patient observed socializing with peers in the dayroom.  Will continue to monitor.

## 2017-07-12 NOTE — Progress Notes (Signed)
[facility] MD Progress Note  07/12/2017 3:56 PM MARGUERETTE SHELLER  MRN:  [id number] Subjective:    [name] is a 42 y/o F with history of schizoaffective disorder bipolar type who was admitted from [facility] on IVC after she presented brought in by her mother with worsening symptoms of agitation, paranoia, insomnia, delusions, and medication non-adherence. Pt's mother provided collateral that pt has been off of her psychotropic medications for the past 4 weeks; pt has an outpatient ACT team. In the ED, she had made bizarre statements such as that she arrived by to the ED by plane crash or had driven through fire to get to the ED. She had poor insight and seemed internally preoccupied in the ED. She was placed on IVC and transferred to [facility] for additional treatment and stabilization.She was started on saphris and lithium, both of which have been titrated up during her stay. She was placed on 1:1 due to ongoing intrusive behaviors. She was started on trial of zyprexa due to ongoing intrusive behaviors, but she noted that it gave her feeling that her "skin was crawling," so she was changed to prolixin, which she has been tolerating well. She was administered prolixin decanoate on 07/11/17, which she tolerated well. Pt has been demonstrating incremental improvement of her presenting symptoms.  Prior to presentation today, RN staff reported that patient had minimal sleep overnight. She was agitated at times overnight and required redirection multiple times by staff due to agitation and conflict with peers. She also had disrobed at one point. She required PRN of thorazine to manage her behaviors.  Today upon evaluation, pt is irritable and drowsy. She shares, "I'm pretty good. No problems with my medications, but some problems with the staff. They're saying I'm being belligerent. I have to cuss out the staff because they're rude and they have no compassion. They lied to me last night - blatant lies." Pt grew increasingly  irritable, but she was able to be redirected during the interview. Pt was asked about her poor sleep, and she replies, "I got up because I was worried about the staff and I was asking for a cup of water." She has poor insight about her mood symptoms and irritablility, and she reports that her mood is "great." She denies SI/HI/AH/VH. She denies physical complaints. She appears drowsy during interview, but she denies feeling drowsy. Discussed with patient that her recent behavior and minimal sleep indicate that she has some ongoing symptoms to address prior to discharge, and pt grew increasingly irritable and demanding of discharge. Encouraged pt that she can demonstrate her appropriateness for discharge by controlling her behaviors and attempting to get good sleep. 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  Principal Problem: Schizoaffective disorder, [name] Diagnosis:   Patient Active Problem List   Diagnosis Date Noted  . Schizoaffective disorder, [name] [F25.0] 02/08/2017  . Adjustment disorder with disturbance of emotion [F43.29] 12/13/2016  . Agitation [R45.1]   . Mood disorder (HCC) [F39]   . Affective psychosis, bipolar (HCC) [F31.9]   . Bipolar affective disorder, currently manic, severe, with psychotic features (HCC) [F31.2] 09/24/2014  . Homicidal ideation [R45.850]   . Bipolar disorder, current episode manic w/o psychotic features, severe (HCC) [F31.13] 08/21/2014  . Cannabis use disorder, moderate, dependence (HCC) [F12.20] 08/21/2014  . Tobacco use disorder [F17.200] 08/21/2014   Total Time spent with patient: 30 minutes  Past Psychiatric History: see H&P  Past Medical History:  Past Medical History:  Diagnosis Date  . Anxiety   . Bipolar affective (HCC)   . OCD (obsessive compulsive disorder)    History reviewed. No pertinent surgical history. Family History:  Family History  Problem Relation Age of Onset  . Drug abuse Father    . Mental illness Other    Family Psychiatric  History: see H&P Social History:  Social History   Substance and Sexual Activity  Alcohol Use Yes   Comment: socially     Social History   Substance and Sexual Activity  Drug Use Yes  . Types: Marijuana   Comment: occasionally    Social History   Socioeconomic History  . Marital status: Single    Spouse name: Not on file  . Number of children: Not on file  . Years of education: Not on file  . Highest education level: Not on file  Occupational History  . Not on file  Social Needs  . Financial resource strain: Not on file  . Food insecurity:    Worry: Not on file    Inability: Not on file  . Transportation needs:    Medical: Not on file    Non-medical: Not on file  Tobacco Use  . Smoking status: Current Every Day Smoker    Packs/day: 1.00    Years: 1.00    Pack years: 1.00    Types: Cigarettes  . Smokeless tobacco: Never Used  Substance and Sexual Activity  . Alcohol use: Yes    Comment: socially  . Drug use: Yes    Types: Marijuana    Comment: occasionally  . Sexual activity: Never    Birth control/protection: None  Lifestyle  . Physical activity:    Days per week: Not on file    Minutes per session: Not on file  . Stress: Not on file  Relationships  . Social connections:    Talks on phone: Not on file    Gets together: Not on file    Attends religious service: Not on file    Active member of club or organization: Not on file    Attends meetings of clubs or organizations: Not on file    Relationship status: Not on file  Other Topics Concern  . Not on file  Social History Narrative  . Not on file   Additional Social History:                         Sleep: Poor  Appetite:  Fair  Current Medications: Current Facility-Administered Medications  Medication Dose Route Frequency Provider Last Rate Last Dose  . acetaminophen (TYLENOL) tablet 650 mg  650 mg Oral Q6H PRN Laveda Abbe,  NP      . alum & mag hydroxide-simeth (MAALOX/MYLANTA) 200-200-20 MG/5ML suspension 30 mL  30 mL Oral Q4H PRN Laveda Abbe, NP      . asenapine (SAPHRIS) sublingual tablet 10 mg  10 mg Sublingual BID Micheal Likens, MD   10 mg at 07/12/17 0753  . benztropine (COGENTIN) tablet 1 mg  1 mg Oral BID PRN Micheal Likens, MD   1 mg at 07/12/17 0330   Or  . benztropine mesylate (COGENTIN) injection 1 mg  1 mg Intramuscular BID PRN Micheal Likens, MD   1 mg at 07/07/17 0945  . chlorproMAZINE (THORAZINE) tablet 50 mg  50 mg Oral Q6H PRN Micheal Likens, MD   50 mg at 07/12/17 0330   Or  . chlorproMAZINE (THORAZINE)  injection 50 mg  50 mg Intramuscular Q6H PRN Jolyne Loa T, MD      . fluPHENAZine (PROLIXIN) tablet 2.5 mg  2.5 mg Oral QHS Jolyne Loa T, MD      . fluPHENAZine decanoate (PROLIXIN) injection 12.5 mg  12.5 mg Intramuscular Q14 Days Jolyne Loa T, MD   12.5 mg at 07/11/17 1029  . hydrOXYzine (ATARAX/VISTARIL) tablet 50 mg  50 mg Oral Q6H PRN Micheal Likens, MD   50 mg at 07/11/17 2200  . ibuprofen (ADVIL,MOTRIN) tablet 600 mg  600 mg Oral Q8H PRN Laveda Abbe, NP      . lithium carbonate (LITHOBID) CR tablet 600 mg  600 mg Oral Q12H Oneta Rack, NP   600 mg at 07/12/17 0752  . LORazepam (ATIVAN) tablet 1 mg  1 mg Oral Once Nira Conn A, NP      . LORazepam (ATIVAN) tablet 1 mg  1 mg Oral Q6H PRN Oneta Rack, NP   1 mg at 07/11/17 2159  . magnesium hydroxide (MILK OF MAGNESIA) suspension 30 mL  30 mL Oral Daily PRN Laveda Abbe, NP   30 mL at 07/06/17 2208  . nicotine (NICODERM CQ - dosed in mg/24 hours) patch 21 mg  21 mg Transdermal Daily Laveda Abbe, NP   21 mg at 07/12/17 0755  . ondansetron (ZOFRAN) tablet 4 mg  4 mg Oral Q8H PRN Laveda Abbe, NP      . traZODone (DESYREL) tablet 100 mg  100 mg Oral QHS PRN,MR X 1 Micheal Likens, MD   100 mg at  07/11/17 2201    Lab Results: No results found for this or any previous visit (from the past 48 hour(s)).  Blood Alcohol level:  Lab Results  Component Value Date   ETH <10 07/05/2017   ETH <10 02/06/2017    Metabolic Disorder Labs: Lab Results  Component Value Date   HGBA1C 6.5 (H) 09/30/2014   MPG 140 09/30/2014   MPG 134 (H) 12/26/2012   Lab Results  Component Value Date   PROLACTIN 150.0 (H) 08/22/2014   Lab Results  Component Value Date   CHOL 137 09/30/2014   TRIG 98 09/30/2014   HDL 37 (L) 09/30/2014   CHOLHDL 3.7 09/30/2014   VLDL 20 09/30/2014   LDLCALC 80 09/30/2014    Physical Findings: AIMS:  , ,  ,  ,    CIWA:    COWS:     Musculoskeletal: Strength & Muscle Tone: within normal limits Gait & Station: normal Patient leans: N/A  Psychiatric Specialty Exam: Physical Exam  Nursing note and vitals reviewed.   Review of Systems  Constitutional: Negative for chills and fever.  Respiratory: Negative for cough and shortness of breath.   Cardiovascular: Negative for chest pain.  Gastrointestinal: Negative for abdominal pain, heartburn, nausea and vomiting.  Psychiatric/Behavioral: Negative for depression, hallucinations and suicidal ideas. The patient is nervous/anxious and has insomnia.     Blood pressure (!) 139/54, pulse (!) 103, temperature 97.7 F (36.5 C), temperature source Oral, resp. rate 20, height  (1.575 m), weight 83.5 kg (184 lb), SpO2 100 %.Body mass index is 33.65 kg/m.  General Appearance: Casual and Fairly Groomed  Eye Contact:  Fair  Speech:  Clear and Coherent and Normal Rate  Volume:  Normal  Mood:  Dysphoric and Irritable  Affect:  Blunt and Labile  Thought Process:  Coherent and Goal Directed  Orientation:  Full (Time, Place, and  Person)  Thought Content:  Logical  Suicidal Thoughts:  No  Homicidal Thoughts:  No  Memory:  Immediate;   Fair Recent;   Fair Remote;   Fair  Judgement:  Poor  Insight:  Lacking   Psychomotor Activity:  Normal  Concentration:  Concentration: Poor  Recall:  [employer] of Knowledge:  Fair  Language:  Fair  Akathisia:  No  Handed:    AIMS (if indicated):     Assets:  Housing Physical Health Resilience Social Support  ADL's:  Intact  Cognition:  WNL  Sleep:  Number of Hours: 4   Treatment Plan Summary: Daily contact with patient to assess and evaluate symptoms and progress in treatment and Medication management   -Continue inpatient hospitalization  -Schizoaffective disorder, bipolar type - Continue Saphris 10 mg po BID  -Continue lithium CR600 mg PO BID   - Continue prolixin 2.5mg  po qhs             -Continue prolixin decanoate 12.5mg  IM q14 days (administered 07/11/17)  -EPS. -Continue Benztropine 1 mg po/IMbid prn EPS  -Anxiety. - Continue Hydroxyzine 50 mg po Q 6 hours prn anxiety.  -Nicotine withdrawal symptoms. -Continue Nicoderm 21 mg topically Q 24 hours.  -Insomnia. -Continue Trazodone 100 mg po Q hs prn insomnia, may repeat x 1.  -Agitation/psychosis             - Continue Thorazine 50 mg po/IMQ 6 hours prn agitation.  - Continue to encourage group attendance. - Discharge dispositionplanning will beongoing.  Micheal Likens, MD 07/12/2017, 3:56 PM

## 2017-07-12 NOTE — Progress Notes (Addendum)
  DATA ACTION RESPONSE  Objective- Pt. is visible in the dayroom, seen watching TV.Presents with a labile/restless/anxious    affect and mood. Assertive with interaction. Pt attended wrap-up group.  Subjective- Denies having any SI/HI/AVH/Pain at this time.Pt. states she hopes to be d/c tomorrow.Is cooperative and remains safe on the unit.  1:1 interaction in private to establish rapport. Encouragement, education, & support given from staff.  PRN ativan and trazodone requested and will re-eval accordingly.Pt is due for a.m. labs.  Safety maintained with Q 15 checks. Continue with POC.

## 2017-07-12 NOTE — Progress Notes (Signed)
Recreation Therapy Notes  Date: 5.8.19 Time: 1000 Location: 500 [address] Dayroom  Group Topic: Self-Esteem  Goal Area(s) Addresses:  Patient will successfully identify positive attributes about themselves.  Patient will successfully identify benefit of improved self-esteem.   Behavioral Response: Engaged, Drowsy  Intervention: Markers, blank crest  Activity: Crest of [place].  Patients were to identify things that mean something to them.  Patients could highlight their biggest accomplishments, proudest moment, best feature, something they are good at or something they value.  Education:  Self-Esteem, Building control surveyor.   Education Outcome: Acknowledges education/In group clarification offered/Needs additional education  Clinical Observations/Feedback: Pt would dose off at points during group but was able to complete activity and share it with the group.  Pt stated her eyes, voice, heart and breast were her best features.  Pt stated she also cares about the wellbeing of others.  Pt also explained her proudest moment was graduating from [employer] with [name] medical certificate.  Pt expressed it's hard for people to focus on their positive traits because "they have to present themselves different in public".   [name], LRT/CTRS     [name], [name] 07/12/2017 10:54 AM

## 2017-07-12 NOTE — Progress Notes (Signed)
The patient expressed in group that she had a good day overall and was focused on getting discharged. She states that she enjoyed the meals in the cafeteria. Her goal for tomorrow is to "make it a peaceful day".

## 2017-07-12 NOTE — Progress Notes (Signed)
Pt up verbally aggressive threatening staff cursing, pt given 50 mg Thorazine and 1 mg Cogentin per MAR. Pt continues to fight sleep and got up in the middle of the night and took a shower trying to wake herself up.

## 2017-07-12 NOTE — [facility] Group Notes (Signed)
LCSW Group Therapy Note  07/12/2017 1:15pm  Type of Therapy/Topic:  Group Therapy:  Balance in Life  Participation Level:  Did Not Attend  Description of Group:    This group will address the concept of balance and how it feels and looks when one is unbalanced. Patients will be encouraged to process areas in their lives that are out of balance and identify reasons for remaining unbalanced. Facilitators will guide patients in utilizing problem-solving interventions to address and correct the stressor making their life unbalanced. Understanding and applying boundaries will be explored and addressed for obtaining and maintaining a balanced life. Patients will be encouraged to explore ways to assertively make their unbalanced needs known to significant others in their lives, using other group members and facilitator for support and feedback.  Therapeutic Goals: 1. Patient will identify two or more emotions or situations they have that consume much of in their lives. 2. Patient will identify signs/triggers that life has become out of balance:  3. Patient will identify two ways to set boundaries in order to achieve balance in their lives:  4. Patient will demonstrate ability to communicate their needs through discussion and/or role plays  Summary of Patient Progress:      Therapeutic Modalities:   Cognitive Behavioral Therapy Solution-Focused Therapy Assertiveness Training  Ida Rogue, [place] 07/12/2017 3:52 PM

## 2017-07-13 LAB — LITHIUM LEVEL: Lithium Lvl: 1.02 mmol/L (ref 0.60–1.20)

## 2017-07-13 MED ORDER — TRAZODONE HCL 100 MG PO TABS: 100.0000 mg | ORAL_TABLET | Freq: Every evening | ORAL | 0 refills | Status: DC | PRN

## 2017-07-13 MED ORDER — NICOTINE 21 MG/24HR TD PT24: 21.0000 mg | MEDICATED_PATCH | Freq: Every day | TRANSDERMAL | 0 refills | Status: DC

## 2017-07-13 MED ORDER — FLUPHENAZINE DECANOATE 25 MG/ML IJ SOLN: 12.5000 mg | INTRAMUSCULAR | 0 refills | Status: DC

## 2017-07-13 MED ORDER — LORAZEPAM 1 MG PO TABS: 1.0000 mg | ORAL_TABLET | Freq: Four times a day (QID) | ORAL | 0 refills | Status: DC | PRN

## 2017-07-13 MED ORDER — ASENAPINE MALEATE 5 MG SL SUBL: 10.0000 mg | SUBLINGUAL_TABLET | Freq: Two times a day (BID) | SUBLINGUAL | 0 refills | Status: DC

## 2017-07-13 MED ORDER — LITHIUM CARBONATE ER 300 MG PO TBCR: 600.0000 mg | EXTENDED_RELEASE_TABLET | Freq: Two times a day (BID) | ORAL | 0 refills | Status: DC

## 2017-07-13 MED ORDER — TRAZODONE HCL 100 MG PO TABS
100.0000 mg | ORAL_TABLET | Freq: Every evening | ORAL | Status: DC | PRN
  Filled 2017-07-13: qty 7

## 2017-07-13 MED ORDER — FLUPHENAZINE HCL 2.5 MG PO TABS: 2.5000 mg | ORAL_TABLET | Freq: Every day | ORAL | 0 refills | Status: DC

## 2017-07-13 MED ORDER — HYDROXYZINE HCL 50 MG PO TABS: 50.0000 mg | ORAL_TABLET | Freq: Four times a day (QID) | ORAL | 0 refills | Status: DC | PRN

## 2017-07-13 MED ORDER — BENZTROPINE MESYLATE 1 MG PO TABS: 1.0000 mg | ORAL_TABLET | Freq: Two times a day (BID) | ORAL | 0 refills | Status: DC | PRN
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
## 2017-07-13 NOTE — Progress Notes (Signed)
  [facility] Adult Case Management Discharge Plan :  Will you be returning to the same living situation after discharge:  Yes,  home At discharge, do you have transportation home?: Yes,  mother Do you have the ability to pay for your medications: Yes,  MCD  Release of information consent forms completed and in the chart;  Patient's signature needed at discharge.  Patient to Follow up at: Follow-up Information    [facility] Follow up.   Why:  Someone from the ACT team will be out to see you the day after d/c Contact information: [address] [place] [facility] [address] [phone]           Next level of care provider has access to [facility] Link:no  Safety Planning and Suicide Prevention discussed: Yes,  yes     Has patient been referred to the Quitline?: N/A patient is not a smoker  Patient has been referred for addiction treatment: N/A  Ida Rogue, LCSW 07/13/2017, 9:11 AM

## 2017-07-13 NOTE — Progress Notes (Signed)
Pt received both written and verbal discharge instructions. Pt verbalized understanding of discharge instructions. Pt agreed to f/u appt and med regimen. Pt received sample meds, prescriptions and d/c packet. Pt gathered belongings from room and locker. Pt safely discharged to the lobby. 

## 2017-07-13 NOTE — Plan of Care (Signed)
Pt was able to cooperate and attend groups in an appropriate manner at completion of recreation therapy sessions.   Caroll Rancher, LRT/CTRS

## 2017-07-13 NOTE — Progress Notes (Signed)
Pt restless throughout night. Pt approached nurses station multiple times. Thought process was disorganized. Pt states she was pregnant.
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
## 2017-07-13 NOTE — Progress Notes (Signed)
Recreation Therapy Notes  Date: 5.9.19 Time: 1000 Location: [address] Dayroom  Group Topic: Communication, Team Building, Problem Solving  Goal Area(s) Addresses:  Patient will effectively work with peer towards shared goal.  Patient will identify skill used to make activity successful.  Patient will identify how skills used during activity can be used to reach post d/c goals.   Behavioral Response: Drowsy  Intervention: STEM Activity   Activity: Straw Bridge. In groups of 2, groups were to build [name] standing bridge that could hold the weight of [name] 60 piece puzzle box.  Each group was given 20 straws and [name] long piece of masking tape.  Education: Pharmacist, community, Building control surveyor.   Education Outcome: Acknowledges education/In group clarification offered/Needs additional education.   Clinical Observations/Feedback: Pt was in group long enough to be teamed up with someone.  Pt was dosing off.  Pt left and did not return.   Caroll Rancher, LRT/CTRS         [name], [name] 07/13/2017 12:22 PM

## 2018-01-14 IMAGING — DX DG CHEST 1V PORT
1 series · 1 of 1 positions shown · non-contrast
Comparison: None in PACs

CLINICAL DATA: AP, tachycardia, bipolar affective disorder, non
compliant with medication.

EXAM:
PORTABLE CHEST 1 VIEW

[chest ap]
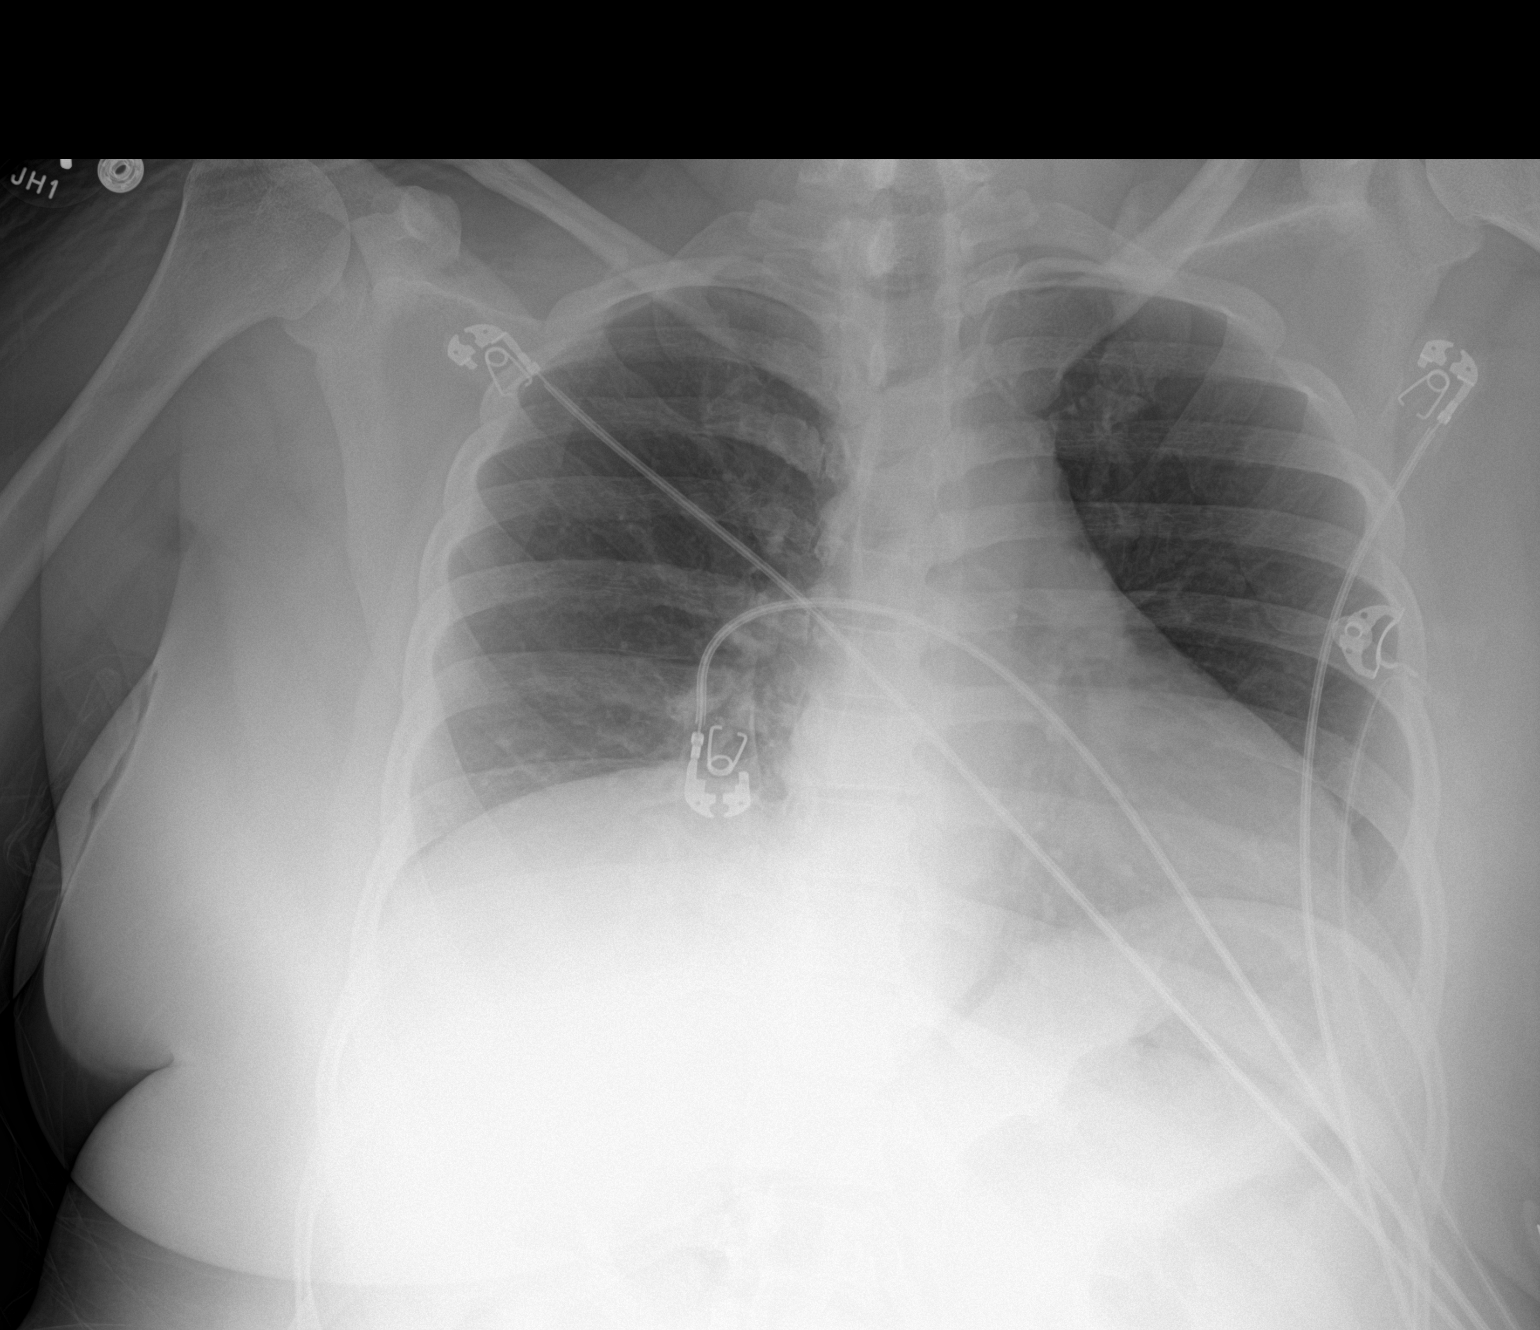

[1 of 1 positions shown; findings below may reference images not displayed]

FINDINGS: The lungs are hypoinflated but clear. The heart and pulmonary
vascularity are normal. The mediastinum is normal in width. The bony
thorax exhibits no acute abnormality.
IMPRESSION: Bilateral hypoinflation. No evidence of aspiration or other acute
cardiopulmonary abnormality.

## 2018-02-12 DIAGNOSIS — Z79899 Other long term (current) drug therapy: Secondary | ICD-10-CM | POA: Diagnosis not present

## 2018-02-12 DIAGNOSIS — Z0000 Encounter for general adult medical examination without abnormal findings: Secondary | ICD-10-CM | POA: Diagnosis not present

## 2018-02-12 DIAGNOSIS — R87618 Other abnormal cytological findings on specimens from cervix uteri: Secondary | ICD-10-CM | POA: Diagnosis not present

## 2018-02-12 DIAGNOSIS — Z01419 Encounter for gynecological examination (general) (routine) without abnormal findings: Secondary | ICD-10-CM | POA: Diagnosis not present

## 2018-09-26 ENCOUNTER — Inpatient Hospital Stay (HOSPITAL_COMMUNITY)
Admission: RE | Admit: 2018-09-26 | Discharge: 2018-10-04 | DRG: 885 | Disposition: A | Discharge status: Home / Self Care | Payer: Medicare Other | Attending: Psychiatry | Admitting: Psychiatry

## 2018-09-26 ENCOUNTER — Encounter (HOSPITAL_COMMUNITY): Payer: Self-pay | Admitting: Behavioral Health

## 2018-09-26 DIAGNOSIS — F1220 Cannabis dependence, uncomplicated: Secondary | ICD-10-CM | POA: Diagnosis present

## 2018-09-26 DIAGNOSIS — Z20828 Contact with and (suspected) exposure to other viral communicable diseases: Secondary | ICD-10-CM | POA: Diagnosis present

## 2018-09-26 DIAGNOSIS — Z813 Family history of other psychoactive substance abuse and dependence: Secondary | ICD-10-CM

## 2018-09-26 DIAGNOSIS — F250 Schizoaffective disorder, bipolar type: Principal | ICD-10-CM | POA: Diagnosis present

## 2018-09-26 DIAGNOSIS — F17210 Nicotine dependence, cigarettes, uncomplicated: Secondary | ICD-10-CM | POA: Diagnosis present

## 2018-09-26 DIAGNOSIS — Z9119 Patient's noncompliance with other medical treatment and regimen: Secondary | ICD-10-CM

## 2018-09-26 DIAGNOSIS — F259 Schizoaffective disorder, unspecified: Secondary | ICD-10-CM | POA: Diagnosis present

## 2018-09-26 LAB — SARS CORONAVIRUS 2 BY RT PCR (HOSPITAL ORDER, PERFORMED IN ~~LOC~~ HOSPITAL LAB): SARS Coronavirus 2: NEGATIVE

## 2018-09-26 MED ORDER — ALUM & MAG HYDROXIDE-SIMETH 200-200-20 MG/5ML PO SUSP: 30.0000 mL | ORAL | Status: DC | PRN

## 2018-09-26 MED ORDER — ACETAMINOPHEN 325 MG PO TABS: 650.0000 mg | ORAL_TABLET | Freq: Four times a day (QID) | ORAL | Status: DC | PRN

## 2018-09-26 MED ORDER — LORAZEPAM 1 MG PO TABS
1.0000 mg | ORAL_TABLET | Freq: Once | ORAL | Status: AC
  Administered 2018-09-26: 19:00:00 1 mg via ORAL
  Filled 2018-09-26: qty 1

## 2018-09-26 MED ORDER — TRAZODONE HCL 100 MG PO TABS
100.0000 mg | ORAL_TABLET | Freq: Every evening | ORAL | Status: DC | PRN
  Administered 2018-09-26 – 2018-09-27 (×2): 100 mg via ORAL
  Filled 2018-09-26 (×2): qty 1

## 2018-09-26 MED ORDER — LORAZEPAM 2 MG/ML IJ SOLN: 1.0000 mg | Freq: Once | INTRAMUSCULAR | Status: AC

## 2018-09-26 NOTE — H&P (Signed)
Behavioral Health Medical Screening Exam  [name] is an 43 y.o. female.who presents to [facility] as a walk-in, voluntarily, although transported by ambulance. When asked how did she arrive to the hospital she replied, " Kanye West." Out of the blue she yells, " Slim shady to the rescue." This is a patient of Dr. Jake Samples (ACT team). She has had multiple admissions to [facility] with most recent per chart review dated 07/2017. She has a history of Schizoaffective disorder, bipolar type as well as agitation, homicidal ideation and Cannabis use disorder. During this evaluation, her thought process is  very disorganized and descriptions of associations are loose.. She endorse both auditory and visual hallucinations and when asked to described both she replied, " I see crab legs, peanuts and cheese. " She endorses she is hearing voices telling her to harm herself.  Some thought blocking is noted.She inappropriately laughs throughout the evaluations. She admits to daily cannabis use although denies any other substance abuse or sue. She denies suicidal or homicidal thoughts. She reports she has been complaint with her current medications although there is  history of non-compliance following review of chart. As per Dr. Jake Samples, patient does meet inpatient criteria and writer agree.   Total Time spent with patient: 15 minutes  Psychiatric Specialty Exam: Physical Exam  Vitals reviewed. Neurological: She is alert.    Review of Systems  Psychiatric/Behavioral: Positive for hallucinations and substance abuse.    There were no vitals taken for this visit.There is no height or weight on file to calculate BMI.  General Appearance: Casual  Eye Contact:  Good  Speech:  Clear and Coherent and Pressured  Volume:  Normal  Mood:  Dysphoric  Affect:  Labile  Thought Process:  Disorganized and Descriptions of Associations: Loose  Orientation:  Full (Time, Place, and Person)  Thought Content:  Hallucinations:  Auditory Command:  hearing voices telling her to harm herself   Suicidal Thoughts:  No  Homicidal Thoughts:  No  Memory:  Immediate;   Fair Recent;   Fair  Judgement:  Impaired  Insight:  Shallow  Psychomotor Activity:  Normal  Concentration: Concentration: Poor and Attention Span: Poor  Recall:  [place]: Fair  Akathisia:  No  Handed:  Right  AIMS (if indicated):     Assets:  Resilience  Sleep:       Musculoskeletal: Strength & Muscle Tone: within normal limits Gait & Station: normal Patient leans: N/A  There were no vitals taken for this visit.  Recommendations:  Based on my evaluation the patient does not appear to have an emergency medical condition.  Patient does meet criteria for inpatient psychiatric hospitalization for stabilization and safety.  There are no appropriate beds available so she will be placed in the observation unit.   Mordecai Maes, NP 09/26/2018, 3:37 PM

## 2018-09-26 NOTE — Progress Notes (Signed)
[name], female   DOB: 1975-04-04, 43 y.o.   MRN: [id number]   D: Pt alert and oriented during [facility] admission process. Pt denies SI/HI and any pain.  A: Education, support, reassurance, and encouragement provided, q15 minute safety checks initiated. Pt's belongings in locker # 80.    "Shawanda D Lindsayis an 43 y.o.female.who presents to [facility] as a walk-in, voluntarily, although transported by ambulance. When asked how did she arrive to the hospital she replied, " Kanye West." Out of the blue she yells, " Slim shady to the rescue." This is a patient of Dr. Jake Samples (ACT team). She has had multiple admissions to [facility] with most recent per chart review dated 07/2017. She has a history ofSchizoaffective disorder, bipolar typeas well as agitation, homicidal ideation and Cannabis use disorder. During this evaluation, her thought process is very disorganized and descriptions of associations are loose.. She endorse both auditory and visual hallucinations and when asked to described both she replied, " I see crab legs, peanuts and cheese. " She endorses she is hearing voices telling her to harm herself. Some thought blocking is noted.She inappropriately laughs throughout the evaluations. She admits to daily cannabis use although denies any other substance abuse or sue. She denies suicidal or homicidal thoughts. She reports she has been complaint with her current medications although there is history of non-compliance following review of chart. As per Dr. Jake Samples, patient does meet inpatient criteria and writer agree."  R: Pt denies any concerns at this time. Pt ambulating on the unit with no issues. Pt remains safe on the unit.

## 2018-09-26 NOTE — BH Assessment (Signed)
Assessment Note  [name] is a 43 y.o. female who presented to [facility] as a voluntary walk-in with complaint of hallucination and delusion.  Pt is followed by Dr. Jake Samples, and it may be that she is not taking medication.  Pt stated that she came to the hospital because she sees ''crabs, peanuts, and stuff.''  Pt indicated that she is experiencing hearing voices that command her to do things (not self-harm), and also that she sees things.  When asked what she sees, Pt indicated the computer in the room.  Pt appeared to be in an altered mental status.  She said that [facility] transported her to the hospital.  When asked where she lives, Pt said, ''[place] to the rescue.''  Pt also indicated that she wanted fried chicken because ''this restaurant has the best fried chicken.''  Pt denied suicidal ideation, homicidal ideation, and self-injurious behavior.  Pt indicated that she wanted to ''be set straight.''  Pt also indicated that she smokes ''a lot'' of marijuana on a daily basis.  During assessment, Pt presented as alert and oriented.  She had good eye contact and was cooperative. Demeanor was calm.  Pt's mood was preoccupied, and affect was mood-congruent.  Pt's speech was normal in rate, rhythm, and volume.  Pt's thought processes suggested flight of ideas, and thought content suggested delusion and hallucination.  Memory was poor.  Concentration was fair to poor.  Insight and judgment were poor.  Impulse control was fair.  Consulted with L. Marcello Moores, FNP, who also spoke with Pt.  Pt is recommended for inpatient treatment.  Diagnosis: Schizoaffective Disorder, Bipolar Type  Past Medical History:  Past Medical History:  Diagnosis Date  . Anxiety   . Bipolar affective ([place])   . OCD (obsessive compulsive disorder)     No past surgical history on file.  Family History:  Family History  Problem Relation Age of Onset  . Drug abuse Father   . Mental illness Other     Social History:  reports  that she has been smoking cigarettes. She has a 1.00 pack-year smoking history. She has never used smokeless tobacco. She reports current alcohol use. She reports current drug use. Drug: Marijuana.  Additional Social History:  Alcohol / Drug Use Pain Medications: See MAR Prescriptions: See MAR Over the Counter: See MAR History of alcohol / drug use?: Yes Substance #1 Name of Substance 1: Marijuana 1 - Amount (size/oz): ''a lot'' 1 - Duration: Ongoing  CIWA:   COWS:    Allergies:  Allergies  Allergen Reactions  . Geodon [Ziprasidone] Anaphylaxis and Other (See Comments)    EPS symptoms  . Valproic Acid Other (See Comments)    Reaction unknown  . Haldol [Haloperidol] Other (See Comments)    Lock-jaw, EPS symptoms  . Risperidone And Related Other (See Comments)    EPS symptoms    Home Medications: (Not in a hospital admission)   OB/GYN Status:  No LMP recorded.  General Assessment Data Location of Assessment: [facility] Assessment Services TTS Assessment: In system Is this a Tele or Face-to-Face Assessment?: Face-to-Face Is this an Initial Assessment or a Re-assessment for this encounter?: Initial Assessment Patient Accompanied by:: N/A Language Other than English: No Living Arrangements: Other (Comment) What gender do you identify as?: Female Pregnancy Status: No Can pt return to current living arrangement?: Yes Admission Status: Voluntary Is patient capable of signing voluntary admission?: Yes Referral Source: Self/Family/Friend Insurance type: [facility] [place]  Name of Psychiatrist: Dr. [name]     Risk to self with the past 6 months Suicidal Ideation: No Has patient been a risk to self within the past 6 months prior to admission? : No Suicidal Intent: No Has patient had any suicidal intent within the past 6 months prior to admission? : No Is patient at risk for suicide?: No Suicidal Plan?: No Has patient had any suicidal plan within the past 6 months  prior to admission? : No Access to Means: No What has been your use of drugs/alcohol within the last 12 months?: Marijuana Intentional Self Injurious Behavior: None Family Suicide History: Unknown Recent stressful life event(s): Other (Comment)(None indicated) Persecutory voices/beliefs?: Yes Depression: No Substance abuse history and/or treatment for substance abuse?: No Suicide prevention information given to non-admitted patients: Not applicable  Risk to Others within the past 6 months Homicidal Ideation: No Does patient have any lifetime risk of violence toward others beyond the six months prior to admission? : No Thoughts of Harm to Others: No Current Homicidal Intent: No Current Homicidal Plan: No Access to Homicidal Means: No History of harm to others?: No Assessment of Violence: None Noted Does patient have access to weapons?: No Criminal Charges Pending?: No Does patient have a court date: No Is patient on probation?: No  Psychosis Hallucinations: Auditory, Visual Delusions: Unspecified  Mental Status Report Appearance/Hygiene: Unremarkable, Other (Comment)(Street clothes) Eye Contact: Good Motor Activity: Freedom of movement, Unremarkable Speech: Tangential Level of Consciousness: Alert Mood: Preoccupied Affect: Preoccupied Anxiety Level: None Thought Processes: Tangential Judgement: Impaired Orientation: Person, Time, Place Obsessive Compulsive Thoughts/Behaviors: None  Cognitive Functioning Concentration: Fair Memory: Remote Impaired, Recent Impaired Is patient IDD: No Impulse Control: Fair Appetite: Good Have you had any weight changes? : No Change Sleep: No Change Vegetative Symptoms: None  ADLScreening [facility]) Patient's cognitive ability adequate to safely complete daily activities?: Yes Patient able to express need for assistance with ADLs?: Yes Independently performs ADLs?: Yes (appropriate for developmental age)  Prior  Inpatient Therapy Prior Inpatient Therapy: Yes Prior Therapy Dates: 2019 and other [facility] and other  Prior Outpatient Therapy Prior Outpatient Therapy: Yes Prior Therapy Dates: Ongoing Prior Therapy Facilty/Provider(s): [facility] Reason for Treatment: Schizoaffective Disorder Does patient have an ACCT team?: No Does patient have Intensive In-House Services?  : No Does patient have [facility] services? : No Does patient have P4CC services?: No  ADL Screening (condition at time of admission) Patient's cognitive ability adequate to safely complete daily activities?: Yes Is the patient deaf or have difficulty hearing?: No Does the patient have difficulty seeing, even when wearing glasses/contacts?: No Does the patient have difficulty concentrating, remembering, or making decisions?: No Patient able to express need for assistance with ADLs?: Yes Does the patient have difficulty dressing or bathing?: No Independently performs ADLs?: Yes (appropriate for developmental age) Does the patient have difficulty walking or climbing stairs?: No Weakness of Legs: None  Home Assistive Devices/Equipment Home Assistive Devices/Equipment: None  Therapy Consults (therapy consults require a physician order) PT Evaluation Needed: No OT Evalulation Needed: No SLP Evaluation Needed: No Abuse/Neglect Assessment (Assessment to be complete while patient is alone) Abuse/Neglect Assessment Can Be Completed: Unable to assess, patient is non-responsive or altered mental status Values / Beliefs Cultural Requests During Hospitalization: None Spiritual Requests During Hospitalization: None Consults Spiritual Care Consult Needed: No Social Work Consult Needed: No Merchant navy officerAdvance Directives (For Healthcare) Does Patient Have a Medical Advance Directive?: No  Disposition:  Disposition Initial Assessment Completed for this Encounter: Yes Disposition of Patient: Admit Type of  inpatient treatment program: Adult(Per L. Maisie Fushomas, FNP, Pt meets inpt criteria)  On Site Evaluation by:   Reviewed with Physician:    Dorris FetchEugene T Neesha Langton 09/26/2018 4:12 PM

## 2018-09-26 NOTE — H&P (Signed)
BH Observation Unit Provider Admission PAA/H&P  Patient Identification: [name] Wechter MRN:  [id number] Date of Evaluation:  09/26/2018 Chief Complaint:  Pending Principal Diagnosis: <principal problem not specified> Diagnosis:  Active Problems:   Schizoaffective disorder (HCC)  History of Present Illness: [name] Batte is an 43 y.o. female.who [facility], voluntarily, although transported by ambulance. When asked how did she arrive to the hospital she replied, " [place]anye West." Out of the blue she yells, " [place]lim [place]ady to the rescue." This is a patient of Dr. [name] (ACT team). [facility] with most recent per chart review dated 07/2017. She has a history of Schizoaffective disorder, bipolar type as well as agitation, homicidal ideation and Cannabis use disorder. During this evaluation, her thought process is  very disorganized and descriptions of associations are loose.. She endorse both auditory and visual hallucinations and when asked to described both she replied, " I see crab legs, peanuts and cheese. " She endorses she is hearing voices telling her to harm herself.  Some thought blocking is noted.She inappropriately laughs throughout the evaluations. She admits to daily cannabis use although denies any other substance abuse or sue. She denies suicidal or homicidal thoughts. She reports she has been complaint with her current medications although there is  history of non-compliance following review of chart. As per Dr. [name], patient does meet inpatient criteria and writer agree.   Associated Signs/Symptoms: Depression Symptoms:  denies (Hypo) Manic Symptoms:  Hallucinations, Labiality of Mood, Anxiety Symptoms:  none Psychotic Symptoms:  Hallucinations: Auditory Command:  hearing voices telling her to harm herself.  PTSD Symptoms: NA Total Time spent with patient: 30 minutes  Past Psychiatric History:Schizoaffective disorder, bipolar type  as well as agitation, homicidal ideation and Cannabis use disorder. Currently has an ACT team. Multiple  psychiatric admissions noted in chart.   Is the patient at risk to self? Yes.    Has the patient been a risk to self in the past 6 months? Yes.    Has the patient been a risk to self within the distant past? Yes.    Is the patient a risk to others? No.  Has the patient been a risk to others in the past 6 months? No.  Has the patient been a risk to others within the distant past? No.    Alcohol Screening:   Substance Abuse History in the last 12 months:  Yes.   Consequences of Substance Abuse: NA Previous Psychotropic Medications: Yes  Psychological Evaluations: Yes  Past Medical History:  Past Medical History:  Diagnosis Date  . Anxiety   . Bipolar affective (HCC)   . OCD (obsessive compulsive disorder)    No past surgical history on file. Family History:  Family History  Problem Relation Age of Onset  . Drug abuse Father   . Mental illness Other    Family Psychiatric History: Pt denies family psychiatric history. Tobacco Screening:   Social History:  Social History   Substance and Sexual Activity  Alcohol Use Yes   Comment: socially     Social History   Substance and Sexual Activity  Drug Use Yes  . Types: Marijuana   Comment: occasionally    Additional Social History:      Pain Medications: See MAR Prescriptions: See MAR Over the Counter: See MAR History of alcohol / drug use?: Yes Name of Substance 1: Marijuana 1 - Amount (size/oz): ''a lot'' 1 - Duration: Ongoing  Allergies:   Allergies  Allergen Reactions  . Geodon [Ziprasidone] Anaphylaxis and Other (See Comments)    EPS symptoms  . Valproic Acid Other (See Comments)    Reaction unknown  . Haldol [Haloperidol] Other (See Comments)    Lock-jaw, EPS symptoms  . Risperidone And Related Other (See Comments)    EPS symptoms   Lab Results: No results found for this or any  previous visit (from the past 48 hour(s)).  Blood Alcohol level:  Lab Results  Component Value Date   ETH <10 07/05/2017   ETH <10 02/06/2017    Metabolic Disorder Labs:  Lab Results  Component Value Date   HGBA1C 6.5 (H) 09/30/2014   MPG 140 09/30/2014   MPG 134 (H) 12/26/2012   Lab Results  Component Value Date   PROLACTIN 150.0 (H) 08/22/2014   Lab Results  Component Value Date   CHOL 137 09/30/2014   TRIG 98 09/30/2014   HDL 37 (L) 09/30/2014   CHOLHDL 3.7 09/30/2014   VLDL 20 09/30/2014   LDLCALC 80 09/30/2014    Current Medications: Current Outpatient Medications  Medication Sig Dispense Refill  . asenapine (SAPHRIS) 5 MG SUBL 24 hr tablet Place 2 tablets (10 mg total) under the tongue 2 (two) times daily. For mood copntrol 60 tablet 0  . benztropine (COGENTIN) 1 MG tablet Take 1 tablet (1 mg total) by mouth 2 (two) times daily as needed (EPS). 60 tablet 0  . fluPHENAZine (PROLIXIN) 2.5 MG tablet Take 1 tablet (2.5 mg total) by mouth at bedtime. For mood control 30 tablet 0  . fluPHENAZine decanoate (PROLIXIN) 25 MG/ML injection Inject 0.5 mLs (12.5 mg total) into the muscle every 14 (fourteen) days. (Due on 07-25-17): For mood control 5 mL 0  . hydrOXYzine (ATARAX/VISTARIL) 50 MG tablet Take 1 tablet (50 mg total) by mouth every 6 (six) hours as needed for anxiety. 60 tablet 0  . lithium carbonate (LITHOBID) 300 MG CR tablet Take 2 tablets (600 mg total) by mouth every 12 (twelve) hours. For mood stabilization 120 tablet 0  . LORazepam (ATIVAN) 1 MG tablet Take 1 tablet (1 mg total) by mouth every 6 (six) hours as needed (agitation). 16 tablet 0  . nicotine (NICODERM CQ - DOSED IN MG/24 HOURS) 21 mg/24hr patch Place 1 patch (21 mg total) onto the skin daily. (May buy from over the counter): For smoking cessation 28 patch 0  . traZODone (DESYREL) 100 MG tablet Take 1 tablet (100 mg total) by mouth at bedtime as needed for sleep. For sleep 30 tablet 0   No current  facility-administered medications for this encounter.    PTA Medications: (Not in a hospital admission)   Musculoskeletal: Strength & Muscle Tone: within normal limits Gait & Station: normal Patient leans: N/A  Psychiatric Specialty Exam: Physical Exam  Nursing note and vitals reviewed. Constitutional: She is oriented to person, place, and time.  Neurological: She is alert and oriented to person, place, and time.    Review of Systems  Psychiatric/Behavioral: Positive for hallucinations and substance abuse. Negative for suicidal ideas.  All other systems reviewed and are negative.   There were no vitals taken for this visit.There is no height or weight on file to calculate BMI.  General Appearance: Casual  Eye Contact:  Good  Speech:  Clear and Coherent and Normal Rate  Volume:  Normal  Mood:  Dysphoric  Affect:  Labile  Thought Process:  Disorganized and Descriptions of Associations: Loose  Orientation:  Full (Time, Place, and Person)  Thought Content:  Hallucinations: Auditory Command:  see above  Suicidal Thoughts:  No  Homicidal Thoughts:  No  Memory:  Immediate;   Fair Recent;   Fair  Judgement:  Impaired  Insight:  Shallow  Psychomotor Activity:  Normal  Concentration:  Concentration: Poor and Attention Span: Poor  Recall:  [employer] of Knowledge:  Fair  Language:  Fair  Akathisia:  Negative  Handed:  Right  AIMS (if indicated):     Assets:  Communication Skills Desire for Improvement  ADL's:  Intact  Cognition:  WNL  Sleep:         Treatment Plan Summary: Daily contact with patient to assess and evaluate symptoms and progress in treatment  Observation Level/Precautions:  15 minute checks Laboratory:  CBC Chemistry Profile HbAIC UDS pregnancy, prolactin, EKG   Medications: As per Dr. Jake Samples, he will review and put in medications    Estimated LOS: tbd       Mordecai Maes, NP 7/22/20204:03 PM

## 2018-09-26 NOTE — Progress Notes (Signed)
[name], female   DOB: 05/18/1975, 43 y.o.   MRN: [id number] Patient in her room, observed to be pouring water over her head, singing out loud, [place]. Pt psychotic, provider notified, medicated with Ativan 1mg  PO given at 645 pm, report given to oncoming shift.

## 2018-09-26 NOTE — Plan of Care (Signed)
[facility] Observation Crisis Plan  Reason for Crisis Plan:  Chronic Mental Illness/Medical Illness, Crisis Stabilization and Medication Management   Plan of Care:  Referral for Telepsychiatry/Psychiatric Consult  Family Support:      Current Living Environment:  Living Arrangements: Spouse/significant other  Insurance:   Hospital Account    Name Acct ID Class Status Primary Coverage   [name], [name] [id number] [place] Open MEDICARE - MEDICARE PART A AND B        Guarantor Account (for Hospital Account [id number])    Name Relation to Pt Service Area Active? Acct Type   [name] D Self [facility] Yes Behavioral Health   Address Phone       [address] [place], [place] [address] [phone])          Coverage Information (for Hospital Account [id number])    1. MEDICARE/MEDICARE PART A AND B    F/O Payor/Plan Precert #   MEDICARE/MEDICARE PART A AND B    Subscriber Subscriber #   [name], [name] [id number]   Address Phone   [address] [place], [place] [address]        2. [facility] MEDICAID/[place]    F/O Payor/Plan Precert #   [facility] MEDICAID/[place] MEDICAID    Subscriber Subscriber #   [name], [name] [id number]   Address Phone   [address] [place], [place] [address] [phone]          Legal Guardian:     Primary Care Provider:  [facility]  Current Outpatient Providers:  ACT  Psychiatrist:  Name of Psychiatrist: Dr. Jake Samples  Counselor/Therapist:     Compliant with Medications:  No  Additional Information:   [name] 7/22/20206:00 PM

## 2018-09-27 DIAGNOSIS — F250 Schizoaffective disorder, bipolar type: Principal | ICD-10-CM

## 2018-09-27 DIAGNOSIS — Z813 Family history of other psychoactive substance abuse and dependence: Secondary | ICD-10-CM | POA: Diagnosis not present

## 2018-09-27 DIAGNOSIS — Z9119 Patient's noncompliance with other medical treatment and regimen: Secondary | ICD-10-CM | POA: Diagnosis not present

## 2018-09-27 DIAGNOSIS — F1220 Cannabis dependence, uncomplicated: Secondary | ICD-10-CM | POA: Diagnosis not present

## 2018-09-27 DIAGNOSIS — F12288 Cannabis dependence with other cannabis-induced disorder: Secondary | ICD-10-CM | POA: Diagnosis not present

## 2018-09-27 DIAGNOSIS — F17210 Nicotine dependence, cigarettes, uncomplicated: Secondary | ICD-10-CM | POA: Diagnosis not present

## 2018-09-27 DIAGNOSIS — Z20828 Contact with and (suspected) exposure to other viral communicable diseases: Secondary | ICD-10-CM | POA: Diagnosis not present

## 2018-09-27 LAB — HEMOGLOBIN A1C
Hgb A1c MFr Bld: 6.1 % — ABNORMAL HIGH (ref 4.8–5.6)
Mean Plasma Glucose: 128.37 mg/dL

## 2018-09-27 LAB — CBC
HCT: 37.7 % (ref 36.0–46.0)
Hemoglobin: 12.2 g/dL (ref 12.0–15.0)
MCH: 27.3 pg (ref 26.0–34.0)
MCHC: 32.4 g/dL (ref 30.0–36.0)
MCV: 84.3 fL (ref 80.0–100.0)
Platelets: 293 10*3/uL (ref 150–400)
RBC: 4.47 MIL/uL (ref 3.87–5.11)
RDW: 16 % — ABNORMAL HIGH (ref 11.5–15.5)
WBC: 7.8 10*3/uL (ref 4.0–10.5)
nRBC: 0 % (ref 0.0–0.2)

## 2018-09-27 LAB — COMPREHENSIVE METABOLIC PANEL
ALT: 24 U/L (ref 0–44)
AST: 28 U/L (ref 15–41)
Albumin: 4.5 g/dL (ref 3.5–5.0)
Alkaline Phosphatase: 56 U/L (ref 38–126)
Anion gap: 10 (ref 5–15)
BUN: 28 mg/dL — ABNORMAL HIGH (ref 6–20)
CO2: 23 mmol/L (ref 22–32)
Calcium: 9.5 mg/dL (ref 8.9–10.3)
Chloride: 108 mmol/L (ref 98–111)
Creatinine, Ser: 1.32 mg/dL — ABNORMAL HIGH (ref 0.44–1.00)
GFR calc Af Amer: 58 mL/min — ABNORMAL LOW (ref >60)
GFR calc non Af Amer: 50 mL/min — ABNORMAL LOW (ref >60)
Glucose, Bld: 124 mg/dL — ABNORMAL HIGH (ref 70–99)
Potassium: 3.7 mmol/L (ref 3.5–5.1)
Sodium: 141 mmol/L (ref 135–145)
Total Bilirubin: 0.4 mg/dL (ref 0.3–1.2)
Total Protein: 8.6 g/dL — ABNORMAL HIGH (ref 6.5–8.1)

## 2018-09-27 LAB — LIPID PANEL
Cholesterol: 168 mg/dL (ref 0–200)
HDL: 22 mg/dL — ABNORMAL LOW (ref >40)
LDL Cholesterol: 123 mg/dL — ABNORMAL HIGH (ref 0–99)
Total CHOL/HDL Ratio: 7.6 RATIO
Triglycerides: 117 mg/dL (ref <150)
VLDL: 23 mg/dL (ref 0–40)

## 2018-09-27 LAB — LITHIUM LEVEL: Lithium Lvl: <0.06 mmol/L — ABNORMAL LOW (ref 0.60–1.20)

## 2018-09-27 LAB — TSH: TSH: 1.4 u[IU]/mL (ref 0.350–4.500)

## 2018-09-27 LAB — ETHANOL: Alcohol, Ethyl (B): <10 mg/dL (ref <10)

## 2018-09-27 MED ORDER — ASENAPINE MALEATE 5 MG SL SUBL
10.0000 mg | SUBLINGUAL_TABLET | Freq: Two times a day (BID) | SUBLINGUAL | Status: DC
  Administered 2018-09-27 – 2018-09-28 (×2): 10 mg via SUBLINGUAL
  Filled 2018-09-27 (×7): qty 2

## 2018-09-27 MED ORDER — TRAZODONE HCL 100 MG PO TABS
100.0000 mg | ORAL_TABLET | Freq: Once | ORAL | Status: AC
  Administered 2018-09-27: 23:00:00 100 mg via ORAL
  Filled 2018-09-27: qty 1

## 2018-09-27 MED ORDER — MAGNESIUM HYDROXIDE 400 MG/5ML PO SUSP: 30.0000 mL | Freq: Every day | ORAL | Status: DC | PRN

## 2018-09-27 MED ORDER — BENZTROPINE MESYLATE 1 MG PO TABS
1.0000 mg | ORAL_TABLET | Freq: Two times a day (BID) | ORAL | Status: DC
  Administered 2018-09-27 – 2018-09-28 (×2): 1 mg via ORAL
  Filled 2018-09-27 (×5): qty 1

## 2018-09-27 MED ORDER — LITHIUM CARBONATE ER 300 MG PO TBCR
300.0000 mg | EXTENDED_RELEASE_TABLET | Freq: Two times a day (BID) | ORAL | Status: DC
  Administered 2018-09-27 – 2018-10-02 (×10): 300 mg via ORAL
  Filled 2018-09-27 (×14): qty 1

## 2018-09-27 MED ORDER — ACETAMINOPHEN 325 MG PO TABS
650.0000 mg | ORAL_TABLET | Freq: Four times a day (QID) | ORAL | Status: DC | PRN
  Administered 2018-09-27 – 2018-09-29 (×2): 650 mg via ORAL
  Filled 2018-09-27 (×2): qty 2

## 2018-09-27 MED ORDER — ALUM & MAG HYDROXIDE-SIMETH 200-200-20 MG/5ML PO SUSP: 30.0000 mL | ORAL | Status: DC | PRN

## 2018-09-27 NOTE — Progress Notes (Signed)
Patient continues to be delusional, oppositional, and resistant to care.  She is fixated on being discharged and is stating that she has a list of people including the ACT Team and celebrities that need to be jailed.

## 2018-09-27 NOTE — Progress Notes (Signed)
Pt is hyper-verbal and intrusive.  She states that she is only here because her mother "lied on" her.  Pt was upset when her mother would not answer the phone after she (pt) cursed at her.  She stated "my mother is mad at me for smoking weed every day, and I don't understand why".

## 2018-09-27 NOTE — Tx Team (Signed)
Initial Treatment Plan 09/27/2018 10:31 PM [name] EXN:[id number]    PATIENT STRESSORS: Health problems Medication change or noncompliance   PATIENT STRENGTHS: Average or above average intelligence Capable of independent living Supportive family/friends   PATIENT IDENTIFIED PROBLEMS:   A/V hallucination  Medication noncompliant  "I want to go home"               DISCHARGE CRITERIA:  Adequate post-discharge living arrangements Improved stabilization in mood, thinking, and/or behavior Motivation to continue treatment in a less acute level of care Safe-care adequate arrangements made  PRELIMINARY DISCHARGE PLAN: Attend aftercare/continuing care group Attend PHP/IOP Return to previous living arrangement  PATIENT/FAMILY INVOLVEMENT: This treatment plan has been presented to and reviewed with the patient, [name]  The patient and family have been given the opportunity to ask questions and make suggestions.  Mal Misty, RN 09/27/2018, 10:31 PM

## 2018-09-27 NOTE — Progress Notes (Signed)
Pt has been up on the unit requiring re-direction. Pt walking the halls touching walls and acting bizarre. Pt given 1 x 100 mg Trazodone per MAR.

## 2018-09-27 NOTE — Consult Note (Addendum)
[facility] Face-to[facility]Face [facility]iatry Consult   Reason for Consult:  Psychosis Referring Physician:  Lucianne MussKumar Patient Identification: [name] Shostak MRN:  [id number] Principal Diagnosis: Schizoaffective disorder, [name] Diagnosis:  Principal Problem:   Schizoaffective disorder, [name] Active Problems:   Cannabis use disorder, severe, dependence (HCC)   Total Time spent with patient: 30 minutes  Subjective:   [name] Dolin is a 43 y.o. female patient admitted with psychosis.  HPI:  Pt was seen and chart reviewed with treatment team and Dr Sharma CovertNorman. Pt denies suicidal/homicidal ideation, denies auditory/visual hallucinations and does not appear to be responding to internal stimuli. [facility] by [facility], for psychosis. Pt is intrusive, psychotic, tangential and irritable. Pt is known to this service and is recommended for an inpatient admission for medication management. Pt stated she is here because she is psychotic but also stated she takes he medication. She admits to cannabis use and stated she has groceries in her car and we need to let her go so she can put her groceries away. Pt stated she lives alone. She followed the treatment team into another patient's room today and was difficult to redirect. She eventually complied and left the room but she is labile and rude to the staff. She would not answer questions and kept saying, "you already know this , it is in the computer." She spends very little time in her room and is seen pacing the unit.   Past Psychiatric History: As above  Risk to Self: Suicidal Ideation: No Suicidal Intent: No Is patient at risk for suicide?: No Suicidal Plan?: No Access to Means: No What has been your use of drugs/alcohol within the last 12 months?: Marijuana Intentional Self Injurious Behavior: None Risk to Others: Homicidal Ideation: No Thoughts of Harm to Others: No Current Homicidal Intent: No Current Homicidal  Plan: No Access to Homicidal Means: No History of harm to others?: No Assessment of Violence: None Noted Does patient have access to weapons?: No Criminal Charges Pending?: No Does patient have a court date: No Prior Inpatient Therapy: Prior Inpatient Therapy: Yes Prior Therapy Dates: 2019 and other [facility] and [facility] Prior Outpatient Therapy: Prior Outpatient Therapy: Yes Prior Therapy Dates: Ongoing Prior Therapy Facilty/Provider(s): [facility] Reason for Treatment: Schizoaffective Disorder Does patient have an ACCT team?: No Does patient have Intensive In-House Services?  : No Does patient have [facility] services? : No Does patient have P4CC services?: No  Past Medical History:  Past Medical History:  Diagnosis Date  . Anxiety   . Bipolar affective (HCC)   . OCD (obsessive compulsive disorder)    No past surgical history on file. Family History:  Family History  Problem Relation Age of Onset  . Drug abuse Father   . Mental illness Other    Family Psychiatric  History: As listed above.  Social History:  Social History   Substance and Sexual Activity  Alcohol Use Yes   Comment: socially     Social History   Substance and Sexual Activity  Drug Use Yes  . Types: Marijuana   Comment: occasionally    Social History   Socioeconomic History  . Marital status: Single    Spouse name: Not on file  . Number of children: Not on file  . Years of education: Not on file  . Highest education level: Not on file  Occupational History  . Occupation: Disability  Social Needs  . Financial resource strain: Not on file  .  Food insecurity    Worry: Not on file    Inability: Not on file  . Transportation needs    Medical: Not on file    Non-medical: Not on file  Tobacco Use  . Smoking status: Current Every Day Smoker    Packs/day: 1.00    Years: 1.00    Pack years: 1.00    Types: Cigarettes  . Smokeless tobacco: Never Used  Substance and Sexual  Activity  . Alcohol use: Yes    Comment: socially  . Drug use: Yes    Types: Marijuana    Comment: occasionally  . Sexual activity: Never    Birth control/protection: None  Lifestyle  . Physical activity    Days per week: Not on file    Minutes per session: Not on file  . Stress: Not on file  Relationships  . Social Musicianconnections    Talks on phone: Not on file    Gets together: Not on file    Attends religious service: Not on file    Active member of club or organization: Not on file    Attends meetings of clubs or organizations: Not on file    Relationship status: Not on file  Other Topics Concern  . Not on file  Social History Narrative   Pt stated that she lives with her husband and two children.  Not verified.  Pt is followed by Dr. [name].   Additional Social History: N/A    Allergies:   Allergies  Allergen Reactions  . Geodon [Ziprasidone] Anaphylaxis and Other (See Comments)    EPS symptoms  . Valproic Acid Other (See Comments)    Reaction unknown  . Haldol [Haloperidol] Other (See Comments)    Lock-jaw, EPS symptoms  . Risperidone And Related Other (See Comments)    EPS symptoms    Labs:  Results for orders placed or performed during the hospital encounter of 09/26/18 (from the past 48 hour(s))  SARS Coronavirus 2 (CEPHEID - Performed [facility] lab), Hosp Order     Status: None   Collection Time: 09/26/18  9:15 PM   Specimen: Nasopharyngeal Swab  Result Value Ref Range   SARS Coronavirus 2 NEGATIVE NEGATIVE    Comment: (NOTE) If result is NEGATIVE SARS-CoV-2 target nucleic acids are NOT DETECTED. The SARS-CoV-2 RNA is generally detectable in upper and lower  respiratory specimens during the acute phase of infection. The lowest  concentration of SARS-CoV-2 viral copies this assay can detect is 250  copies / mL. A negative result does not preclude SARS-CoV-2 infection  and should not be used as the sole basis for treatment or other  patient  management decisions.  A negative result may occur with  improper specimen collection / handling, submission of specimen other  than nasopharyngeal swab, presence of viral mutation(s) within the  areas targeted by this assay, and inadequate number of viral copies  (<250 copies / mL). A negative result must be combined with clinical  observations, patient history, and epidemiological information. If result is POSITIVE SARS-CoV-2 target nucleic acids are DETECTED. The SARS-CoV-2 RNA is generally detectable in upper and lower  respiratory specimens dur ing the acute phase of infection.  Positive  results are indicative of active infection with SARS-CoV-2.  Clinical  correlation with patient history and other diagnostic information is  necessary to determine patient infection status.  Positive results do  not rule out bacterial infection or co-infection with other viruses. If result is PRESUMPTIVE POSTIVE SARS-CoV-2 nucleic  acids MAY BE PRESENT.   A presumptive positive result was obtained on the submitted specimen  and confirmed on repeat testing.  While 2019 novel coronavirus  (SARS-CoV-2) nucleic acids may be present in the submitted sample  additional confirmatory testing may be necessary for epidemiological  and / or clinical management purposes  to differentiate between  SARS-CoV-2 and other Sarbecovirus currently known to infect humans.  If clinically indicated additional testing with an alternate test  methodology [phone]) is advised. The SARS-CoV-2 RNA is generally  detectable in upper and lower respiratory sp ecimens during the acute  phase of infection. The expected result is Negative. Fact Sheet for Patients:  BoilerBrush.com.cy Fact Sheet for Healthcare Providers: https://pope.com/ This test is not yet approved or cleared by the [place] FDA and has been authorized for detection and/or diagnosis of SARS-CoV-2 by FDA under  an Emergency Use Authorization (EUA).  This EUA will remain in effect (meaning this test can be used) for the duration of the COVID-19 declaration under Section 564(b)(1) of the Act, 21 U.S.C. section 360bbb-3(b)(1), unless the authorization is terminated or revoked sooner. Performed at [facility], [address], [place], [place] [address]   CBC     Status: Abnormal   Collection Time: 09/27/18  6:55 AM  Result Value Ref Range   WBC 7.8 4.0 - 10.5 K/uL   RBC 4.47 3.87 - 5.11 MIL/uL   Hemoglobin 12.2 12.0 - 15.0 g/dL   HCT 81.1 91.4 - 78.2 %   MCV 84.3 80.0 - 100.0 fL   MCH 27.3 26.0 - 34.0 pg   MCHC 32.4 30.0 - 36.0 g/dL   RDW 95.6 (H) 21.3 - 08.6 %   Platelets 293 150 - 400 K/uL   nRBC 0.0 0.0 - 0.2 %    Comment: Performed at [facility], [address], [place], [place] [address]  Comprehensive metabolic panel     Status: Abnormal   Collection Time: 09/27/18  6:55 AM  Result Value Ref Range   Sodium 141 135 - 145 mmol/L   Potassium 3.7 3.5 - 5.1 mmol/L   Chloride 108 98 - 111 mmol/L   CO2 23 22 - 32 mmol/L   Glucose, Bld 124 (H) 70 - 99 mg/dL   BUN 28 (H) 6 - 20 mg/dL   Creatinine, Ser 9.62 (H) 0.44 - 1.00 mg/dL   Calcium 9.5 8.9 - 95.2 mg/dL   Total Protein 8.6 (H) 6.5 - 8.1 g/dL   Albumin 4.5 3.5 - 5.0 g/dL   AST 28 15 - 41 U/L   ALT 24 0 - 44 U/L   Alkaline Phosphatase 56 38 - 126 U/L   Total Bilirubin 0.4 0.3 - 1.2 mg/dL   GFR calc non Af Amer 50 (L) >60 mL/min   GFR calc Af Amer 58 (L) >60 mL/min   Anion gap 10 5 - 15    Comment: Performed at [facility], [address] [place], [place] [address]  Hemoglobin A1c     Status: Abnormal   Collection Time: 09/27/18  6:55 AM  Result Value Ref Range   Hgb A1c MFr Bld 6.1 (H) 4.8 - 5.6 %    Comment: (NOTE) Pre diabetes:          5.7%-6.4% Diabetes:              >6.4% Glycemic control for   <7.0% adults with diabetes    Mean Plasma Glucose 128.37 mg/dL  Comment: Performed at [facility], [place] [address] [place], [place] [address]  Ethanol     Status: None   Collection Time: 09/27/18  6:55 AM  Result Value Ref Range   Alcohol, Ethyl (B) <10 <10 mg/dL    Comment: (NOTE) Lowest detectable limit for serum alcohol is 10 mg/dL. For medical purposes only. Performed at [facility], [place] [address] [place], [place] [address]   Lipid panel     Status: Abnormal   Collection Time: 09/27/18  6:55 AM  Result Value Ref Range   Cholesterol 168 0 - 200 mg/dL   Triglycerides 117 <150 mg/dL   HDL 22 (L) >40 mg/dL   Total CHOL/HDL Ratio 7.6 RATIO   VLDL 23 0 - 40 mg/dL   LDL Cholesterol 123 (H) 0 - 99 mg/dL    Comment:        Total Cholesterol/HDL:CHD Risk Coronary Heart Disease Risk Table                     Men   Women  1/2 Average Risk   3.4   3.3  Average Risk       5.0   4.4  2 X Average Risk   9.6   7.1  3 X Average Risk  23.4   11.0        Use the calculated Patient Ratio above and the CHD Risk Table to determine the patient's CHD Risk.        ATP III CLASSIFICATION (LDL):  <100     mg/dL   Optimal  100-129  mg/dL   Near or Above                    Optimal  130-159  mg/dL   Borderline  160-189  mg/dL   High  >190     mg/dL   Very High Performed at [facility] [address], [place], [place] [address]   TSH     Status: None   Collection Time: 09/27/18  6:55 AM  Result Value Ref Range   TSH 1.400 0.350 - 4.500 uIU/mL    Comment: Performed by a 3rd Generation assay with a functional sensitivity of <=0.01 uIU/mL. Performed at [facility] [place] [address], [place], [place] [address]   Lithium level     Status: Abnormal   Collection Time: 09/27/18  6:55 AM  Result Value Ref Range   Lithium Lvl <0.06 (L) 0.60 - 1.20 mmol/L    Comment: REPEATED TO VERIFY Performed at [facility], [place] [address] [place],  [address]     Current  Facility-Administered Medications  Medication Dose Route Frequency Provider Last Rate Last Dose  . acetaminophen (TYLENOL) tablet 650 mg  650 mg Oral Q6H PRN Mordecai Maes, NP      . alum & mag hydroxide-simeth (MAALOX/MYLANTA) 200-200-20 MG/5ML suspension 30 mL  30 mL Oral Q4H PRN Mordecai Maes, NP      . asenapine (SAPHRIS) sublingual tablet 10 mg  10 mg Sublingual BID Faythe Dingwall, DO      . benztropine (COGENTIN) tablet 1 mg  1 mg Oral BID Faythe Dingwall, DO      . lithium carbonate (LITHOBID) CR tablet 300 mg  300 mg Oral Q12H Faythe Dingwall, DO      . traZODone (DESYREL) tablet 100 mg  100 mg Oral QHS PRN Rozetta Nunnery, NP  100 mg at 09/26/18 2114    Musculoskeletal: Strength & Muscle Tone: within normal limits Gait & Station: normal Patient leans: N/A  Psychiatric Specialty Exam: Physical Exam  Constitutional: She appears well-developed and well-nourished.  HENT:  Head: Normocephalic.  Respiratory: Effort normal.  Musculoskeletal: Normal range of motion.  Neurological: She is alert.  Psychiatric: Her affect is labile. Her speech is rapid and/or pressured. She is agitated. Thought content is paranoid and delusional. She expresses inappropriate judgment.    Review of Systems  Psychiatric/Behavioral: Positive for substance abuse. The patient is nervous/anxious.   All other systems reviewed and are negative.   Blood pressure 124/84, pulse (!) 153, temperature 97.6 F (36.4 C), temperature source Oral, resp. rate 18, SpO2 100 %.There is no height or weight on file to calculate BMI.  General Appearance: Casual, dressed in purple scrubs  Eye Contact:  Fair  Speech:  Pressured  Volume:  Increased  Mood:  Angry and Irritable, rude, difficult to redirect  Affect:  Congruent and Labile  Thought Process:  Disorganized  Orientation:  Full (Time, Place, and Person)  Thought Content:  Illogical and Tangential, ruminating about her groceries and her car keys   Suicidal Thoughts:  No, denies  Homicidal Thoughts:  No, denies  Memory:  Immediate;   Poor Recent;   Poor Remote;   Poor  Judgement:  Impaired  Insight:  Shallow  Psychomotor Activity:  Increased, pacing the unit  Concentration:  Concentration: Poor and Attention Span: Poor  Recall:  Poor  Fund of Knowledge:  Fair  Language:  Good  Akathisia:  No  Handed:  Right  AIMS (if indicated):   N/A  Assets:  Communication Skills Housing  ADL's:  Intact  Cognition:  WNL  Sleep:   N/A     Treatment Plan Summary: Daily contact with patient to assess and evaluate symptoms and progress in treatment and Medication management  Disposition: Recommend psychiatric Inpatient admission when medically cleared.  Laveda AbbeLaurie Britton Parks, NP 09/27/2018 1:04 PM   Patient seen face-to-face for psychiatric evaluation, chart reviewed and case discussed with the physician extender and developed treatment plan. Reviewed the information documented and agree with the treatment plan.  Juanetta BeetsJacqueline Camellia Popescu, DO 09/27/18 10:34 PM

## 2018-09-27 NOTE — Progress Notes (Signed)
Pt states she wants to go home so she can "get drunk and high and go to sleep".  Pt states she receives SSI.

## 2018-09-27 NOTE — Progress Notes (Signed)
Pt states that she won't take her medications here because she gets them delivered to her home and that she needs to go home because she has groceries that are spoiling in her car.

## 2018-09-27 NOTE — BH Assessment (Signed)
[facility] Assessment Progress Note  Per Buford Dresser, DO, this pt requires psychiatric hospitalization.  Brook McNichol, RN, [facility] reports that a 500 hall bed will be available for this pt later today.  Dr Mariea Clonts also finds that pt meets criteria for IVC, which she has initiated.  IVC documents have been faxed to [facility], and at 12:18 Lattie Corns confirms receipt.  He has since faxed Findings and Custody Order to this Probation officer.  At 12:35 I called [employer] and spoke to [employer], who took demographic information, agreeing to dispatch law enforcement to fill out Return of Service.  IVC documents have been left on pt's chart on the Observation Unit for law enforcement to serve when they arrive.  Pt's nurse, Stanton Kidney, has been notified.   Jalene Mullet, [place] Coordinator [phone]

## 2018-09-28 DIAGNOSIS — F12288 Cannabis dependence with other cannabis-induced disorder: Secondary | ICD-10-CM

## 2018-09-28 LAB — PROLACTIN: Prolactin: 15.1 ng/mL (ref 4.8–23.3)

## 2018-09-28 MED ORDER — HALOPERIDOL LACTATE 5 MG/ML IJ SOLN: 10.0000 mg | Freq: Four times a day (QID) | INTRAMUSCULAR | Status: DC | PRN

## 2018-09-28 MED ORDER — HALOPERIDOL 5 MG PO TABS
10.0000 mg | ORAL_TABLET | Freq: Three times a day (TID) | ORAL | Status: DC
  Administered 2018-09-28 (×2): 10 mg via ORAL
  Filled 2018-09-28 (×17): qty 2

## 2018-09-28 MED ORDER — HALOPERIDOL 5 MG PO TABS
5.0000 mg | ORAL_TABLET | Freq: Four times a day (QID) | ORAL | Status: DC | PRN
  Filled 2018-09-28: qty 1

## 2018-09-28 MED ORDER — TRAZODONE HCL 150 MG PO TABS
400.0000 mg | ORAL_TABLET | Freq: Every day | ORAL | Status: DC
  Administered 2018-09-28 – 2018-10-03 (×6): 400 mg via ORAL
  Filled 2018-09-28 (×7): qty 1

## 2018-09-28 MED ORDER — LORAZEPAM 2 MG/ML IJ SOLN: 2.0000 mg | INTRAMUSCULAR | Status: DC | PRN

## 2018-09-28 MED ORDER — DIPHENHYDRAMINE HCL 50 MG PO CAPS
50.0000 mg | ORAL_CAPSULE | Freq: Three times a day (TID) | ORAL | Status: DC
  Administered 2018-09-28 – 2018-10-04 (×18): 50 mg via ORAL
  Filled 2018-09-28: qty 1
  Filled 2018-09-28: qty 2
  Filled 2018-09-28 (×21): qty 1

## 2018-09-28 MED ORDER — LORAZEPAM 2 MG/ML IJ SOLN
4.0000 mg | Freq: Once | INTRAMUSCULAR | Status: AC
  Administered 2018-09-28: 09:00:00 4 mg via INTRAMUSCULAR
  Filled 2018-09-28: qty 2

## 2018-09-28 MED ORDER — LORAZEPAM 1 MG PO TABS
2.0000 mg | ORAL_TABLET | ORAL | Status: DC | PRN
  Administered 2018-09-29 – 2018-10-01 (×3): 2 mg via ORAL
  Filled 2018-09-28 (×4): qty 2

## 2018-09-28 MED ORDER — TRAZODONE HCL 150 MG PO TABS
300.0000 mg | ORAL_TABLET | Freq: Every day | ORAL | Status: DC
  Filled 2018-09-28 (×2): qty 2

## 2018-09-28 NOTE — Progress Notes (Signed)
[name], female   DOB: 06-05-1975, 43 y.o.   MRN: [id number]  Patient slept for most of the day after receiving injections this morning. Patient has now exited her room eating her lunch tray with requests for more food. Patient informed dinner is in a half hour and verbalizes understanding. Patient provided evening scheduled medications without incident.

## 2018-09-28 NOTE — Progress Notes (Signed)
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 Patient will follow directions on first prompt.  Behavioral Response: Appropriate  Intervention: Worksheet  Activity:  Staff on 500 hall were provided with a worksheet on Strengths Exploration. Staff was instructed to give it to the patients and have them work on it in place of [place]. Staff was also given 2 coloring sheets and 2 word searches and were given the option to give them out.  Education:  Ability to follow Directions, Change of thought processes Discharge Planning, Goal Planning.   Education Outcome: Acknowledges education/In group clarification offered  Clinical Observations/Feedback: . Due to COVID-19, guidelines group was not held. Group members were provided a learning activity worksheet to work on the topic and above-stated goals. LRT is available to answer any questions patient may have regarding the worksheet.  Tomi Likens, LRT/CTRS         Daimian Sudberry L Laityn Bensen 09/28/2018 12:06 PM

## 2018-09-28 NOTE — [facility] Counselor (Signed)
CSW attempted to meet with patient for PSA assessment. Patient is currently asleep due to medications. This is CSW's 1st attempt at PSA.

## 2018-09-28 NOTE — Progress Notes (Signed)
[facility] MD Progress Note  09/28/2018 10:56 AM Teressa SenterChanda D Rasp  MRN:  [id number] Subjective:   Ladonna SnideShonda is well-known to the service of 43-year-old patient with a known bipolar/schizoaffective type condition/history of cannabis abuse/presenting with acute psychosis/disorganized thought and behavior/pretty much talking nonstop and making delusional disjointed nonsensical statements.  Today she is alert and manic she is oriented to person place situation and time of day but not exact date.  Again she is rambling and pressured in speech and requires IM medications because she is so disruptive on the ward.  When I am interviewing of the patient she comes in the room and screams "he is a liar" and she again requires no constant redirection and requires IM medications today.  Principal Problem: Schizoaffective disorder, [name] Diagnosis: Principal Problem:   Schizoaffective disorder, [name] Active Problems:   Cannabis use disorder, severe, dependence (HCC)   Schizoaffective disorder (HCC)  Total Time spent with patient: 20 minutes  Past Psychiatric History: Has in the past been more stable on long-acting injectable paliperidone and at times Abilify, and even at one point they overlapped and she was very stable at that point in time at any rate she has refused them more recently  Past Medical History:  Past Medical History:  Diagnosis Date  . Anxiety   . Bipolar affective (HCC)   . OCD (obsessive compulsive disorder)    No past surgical history on file. Family History:  Family History  Problem Relation Age of Onset  . Drug abuse Father   . Mental illness Other    Family Psychiatric  History: no new data Social History:  Social History   Substance and Sexual Activity  Alcohol Use Yes   Comment: socially     Social History   Substance and Sexual Activity  Drug Use Yes  . Types: Marijuana   Comment: occasionally    Social History   Socioeconomic History  .  Marital status: Single    Spouse name: Not on file  . Number of children: Not on file  . Years of education: Not on file  . Highest education level: Not on file  Occupational History  . Occupation: Disability  Social Needs  . Financial resource strain: Not on file  . Food insecurity    Worry: Not on file    Inability: Not on file  . Transportation needs    Medical: Not on file    Non-medical: Not on file  Tobacco Use  . Smoking status: Current Every Day Smoker    Packs/day: 1.00    Years: 1.00    Pack years: 1.00    Types: Cigarettes  . Smokeless tobacco: Never Used  Substance and Sexual Activity  . Alcohol use: Yes    Comment: socially  . Drug use: Yes    Types: Marijuana    Comment: occasionally  . Sexual activity: Never    Birth control/protection: None  Lifestyle  . Physical activity    Days per week: Not on file    Minutes per session: Not on file  . Stress: Not on file  Relationships  . Social Musicianconnections    Talks on phone: Not on file    Gets together: Not on file    Attends religious service: Not on file    Active member of club or organization: Not on file    Attends meetings of clubs or organizations: Not on file    Relationship status: Not on file  Other Topics Concern  .  Not on file  Social History Narrative   Pt stated that she lives with her husband and two children.  Not verified.  Pt is followed by Dr. [name].   Additional Social History:    Pain Medications: See MAR Prescriptions: See MAR Over the Counter: See MAR History of alcohol / drug use?: Yes Name of Substance 1: Marijuana 1 - Amount (size/oz): ''a lot'' 1 - Duration: Ongoing                  Sleep: Fair  Appetite:  Fair  Current Medications: Current Facility-Administered Medications  Medication Dose Route Frequency Provider Last Rate Last Dose  . acetaminophen (TYLENOL) tablet 650 mg  650 mg Oral Q6H PRN Laveda AbbeParks, Laurie Britton, NP   650 mg at 09/27/18 2344  . alum & mag  hydroxide-simeth (MAALOX/MYLANTA) 200-200-20 MG/5ML suspension 30 mL  30 mL Oral Q4H PRN Laveda AbbeParks, Laurie Britton, NP      . diphenhydrAMINE (BENADRYL) capsule 50 mg  50 mg Oral TID Malvin JohnsFarah, Reinaldo Helt, MD   50 mg at 09/28/18 [id number]  . haloperidol (HALDOL) tablet 10 mg  10 mg Oral TID Malvin JohnsFarah, Tressie Ragin, MD   10 mg at 09/28/18 0829  . haloperidol (HALDOL) tablet 5 mg  5 mg Oral Q6H PRN Malvin JohnsFarah, Daltyn Degroat, MD       Or  . haloperidol lactate (HALDOL) injection 10 mg  10 mg Intramuscular Q6H PRN Malvin JohnsFarah, Truitt Cruey, MD      . lithium carbonate (LITHOBID) CR tablet 300 mg  300 mg Oral Q12H Laveda AbbeParks, Laurie Britton, NP   300 mg at 09/28/18 0743  . LORazepam (ATIVAN) tablet 2 mg  2 mg Oral Q4H PRN Malvin JohnsFarah, Rien Marland, MD       Or  . LORazepam (ATIVAN) injection 2 mg  2 mg Intramuscular Q4H PRN Malvin JohnsFarah, Kelcy Laible, MD      . magnesium hydroxide (MILK OF MAGNESIA) suspension 30 mL  30 mL Oral Daily PRN Laveda AbbeParks, Laurie Britton, NP      . traZODone (DESYREL) tablet 300 mg  300 mg Oral QHS Malvin JohnsFarah, Sol Englert, MD        Lab Results:  Results for orders placed or performed during the hospital encounter of 09/26/18 (from the past 48 hour(s))  SARS Coronavirus 2 (CEPHEID - Performed in [facility] lab), Hosp Order     Status: None   Collection Time: 09/26/18  9:15 PM   Specimen: Nasopharyngeal Swab  Result Value Ref Range   SARS Coronavirus 2 NEGATIVE NEGATIVE    Comment: (NOTE) If result is NEGATIVE SARS-CoV-2 target nucleic acids are NOT DETECTED. The SARS-CoV-2 RNA is generally detectable in upper and lower  respiratory specimens during the acute phase of infection. The lowest  concentration of SARS-CoV-2 viral copies this assay can detect is 250  copies / mL. A negative result does not preclude SARS-CoV-2 infection  and should not be used as the sole basis for treatment or other  patient management decisions.  A negative result may occur with  improper specimen collection / handling, submission of specimen other  than nasopharyngeal swab,  presence of viral mutation(s) within the  areas targeted by this assay, and inadequate number of viral copies  (<250 copies / mL). A negative result must be combined with clinical  observations, patient history, and epidemiological information. If result is POSITIVE SARS-CoV-2 target nucleic acids are DETECTED. The SARS-CoV-2 RNA is generally detectable in upper and lower  respiratory specimens dur ing the acute phase of infection.  Positive  results are indicative of active infection with SARS-CoV-2.  Clinical  correlation with patient history and other diagnostic information is  necessary to determine patient infection status.  Positive results do  not rule out bacterial infection or co-infection with other viruses. If result is PRESUMPTIVE POSTIVE SARS-CoV-2 nucleic acids MAY BE PRESENT.   A presumptive positive result was obtained on the submitted specimen  and confirmed on repeat testing.  While 2019 novel coronavirus  (SARS-CoV-2) nucleic acids may be present in the submitted sample  additional confirmatory testing may be necessary for epidemiological  and / or clinical management purposes  to differentiate between  SARS-CoV-2 and other Sarbecovirus currently known to infect humans.  If clinically indicated additional testing with an alternate test  methodology [phone]) is advised. The SARS-CoV-2 RNA is generally  detectable in upper and lower respiratory sp ecimens during the acute  phase of infection. The expected result is Negative. Fact Sheet for Patients:  StrictlyIdeas.no Fact Sheet for Healthcare Providers: BankingDealers.co.za This test is not yet approved or cleared by the [place] FDA and has been authorized for detection and/or diagnosis of SARS-CoV-2 by FDA under an Emergency Use Authorization (EUA).  This EUA will remain in effect (meaning this test can be used) for the duration of the COVID-19 declaration under  Section 564(b)(1) of the Act, 21 U.S.C. section 360bbb-3(b)(1), unless the authorization is terminated or revoked sooner. Performed at [facility], [place] [address], [place], [place] [address]   CBC     Status: Abnormal   Collection Time: 09/27/18  6:55 AM  Result Value Ref Range   WBC 7.8 4.0 - 10.5 K/uL   RBC 4.47 3.87 - 5.11 MIL/uL   Hemoglobin 12.2 12.0 - 15.0 g/dL   HCT 37.7 36.0 - 46.0 %   MCV 84.3 80.0 - 100.0 fL   MCH 27.3 26.0 - 34.0 pg   MCHC 32.4 30.0 - 36.0 g/dL   RDW 16.0 (H) 11.5 - 15.5 %   Platelets 293 150 - 400 K/uL   nRBC 0.0 0.0 - 0.2 %    Comment: Performed at [facility], [place] [address], [place], [place] [address]  Comprehensive metabolic panel     Status: Abnormal   Collection Time: 09/27/18  6:55 AM  Result Value Ref Range   Sodium 141 135 - 145 mmol/L   Potassium 3.7 3.5 - 5.1 mmol/L   Chloride 108 98 - 111 mmol/L   CO2 23 22 - 32 mmol/L   Glucose, Bld 124 (H) 70 - 99 mg/dL   BUN 28 (H) 6 - 20 mg/dL   Creatinine, Ser 1.32 (H) 0.44 - 1.00 mg/dL   Calcium 9.5 8.9 - 10.3 mg/dL   Total Protein 8.6 (H) 6.5 - 8.1 g/dL   Albumin 4.5 3.5 - 5.0 g/dL   AST 28 15 - 41 U/L   ALT 24 0 - 44 U/L   Alkaline Phosphatase 56 38 - 126 U/L   Total Bilirubin 0.4 0.3 - 1.2 mg/dL   GFR calc non Af Amer 50 (L) >60 mL/min   GFR calc Af Amer 58 (L) >60 mL/min   Anion gap 10 5 - 15    Comment: Performed at [facility], [place] [address], [place], [place] [address]  Hemoglobin A1c     Status: Abnormal   Collection Time: 09/27/18  6:55 AM  Result Value Ref Range   Hgb A1c MFr Bld 6.1 (H) 4.8 - 5.6 %  Comment: (NOTE) Pre diabetes:          5.7%-6.4% Diabetes:              >6.4% Glycemic control for   <7.0% adults with diabetes    Mean Plasma Glucose 128.37 mg/dL    Comment: Performed at [facility], [address], [place], [place] [address]  Ethanol     Status: None   Collection Time: 09/27/18  6:55 AM   Result Value Ref Range   Alcohol, Ethyl (B) <10 <10 mg/dL    Comment: (NOTE) Lowest detectable limit for serum alcohol is 10 mg/dL. For medical purposes only. Performed at [facility], [address], [place], [place] [address]   Lipid panel     Status: Abnormal   Collection Time: 09/27/18  6:55 AM  Result Value Ref Range   Cholesterol 168 0 - 200 mg/dL   Triglycerides 295 <284 mg/dL   HDL 22 (L) >13 mg/dL   Total CHOL/HDL Ratio 7.6 RATIO   VLDL 23 0 - 40 mg/dL   LDL Cholesterol 244 (H) 0 - 99 mg/dL    Comment:        Total Cholesterol/HDL:CHD Risk Coronary Heart Disease Risk Table                     Men   Women  1/2 Average Risk   3.4   3.3  Average Risk       5.0   4.4  2 X Average Risk   9.6   7.1  3 X Average Risk  23.4   11.0        Use the calculated Patient Ratio above and the CHD Risk Table to determine the patient's CHD Risk.        ATP III CLASSIFICATION (LDL):  <100     mg/dL   Optimal  010-272  mg/dL   Near or Above                    Optimal  130-159  mg/dL   Borderline  536-644  mg/dL   High  >034     mg/dL   Very High Performed at [facility], [address], [place], [place] [address]   TSH     Status: None   Collection Time: 09/27/18  6:55 AM  Result Value Ref Range   TSH 1.400 0.350 - 4.500 uIU/mL    Comment: Performed by a 3rd Generation assay with a functional sensitivity of <=0.01 uIU/mL. Performed at [facility], [address], [place], [place] [address]   Prolactin     Status: None   Collection Time: 09/27/18  6:55 AM  Result Value Ref Range   Prolactin 15.1 4.8 - 23.3 ng/mL    Comment: (NOTE) Performed At: [facility] [address] [place], [place] [id number] Jolene Schimke MD OA:[id number]   Lithium level     Status: Abnormal   Collection Time: 09/27/18  6:55 AM  Result Value Ref Range   Lithium Lvl <0.06 (L) 0.60 - 1.20 mmol/L    Comment: REPEATED TO  VERIFY Performed at [facility], [address] [place], [place] [address]     Blood Alcohol level:  Lab Results  Component Value Date   [facility] <10 09/27/2018   ETH <10 07/05/2017    Metabolic Disorder Labs: Lab Results  Component Value Date   HGBA1C 6.1 (H) 09/27/2018  MPG 128.37 09/27/2018   MPG 140 09/30/2014   Lab Results  Component Value Date   PROLACTIN 15.1 09/27/2018   PROLACTIN 150.0 (H) 08/22/2014   Lab Results  Component Value Date   CHOL 168 09/27/2018   TRIG 117 09/27/2018   HDL 22 (L) 09/27/2018   CHOLHDL 7.6 09/27/2018   VLDL 23 09/27/2018   LDLCALC 123 (H) 09/27/2018   LDLCALC 80 09/30/2014    Physical Findings: AIMS: Facial and Oral Movements Muscles of Facial Expression: None, normal Lips and Perioral Area: None, normal Jaw: None, normal Tongue: None, normal,Extremity Movements Upper (arms, wrists, hands, fingers): None, normal Lower (legs, knees, ankles, toes): None, normal, Trunk Movements Neck, shoulders, hips: None, normal, Overall Severity Severity of abnormal movements (highest score from questions above): None, normal Incapacitation due to abnormal movements: None, normal Patient's awareness of abnormal movements (rate only patient's report): No Awareness, Dental Status Current problems with teeth and/or dentures?: No Does patient usually wear dentures?: No  CIWA:  CIWA-Ar Total: 0 COWS:     Musculoskeletal: Strength & Muscle Tone: within normal limits Gait & Station: normal Patient leans: N/A  Psychiatric Specialty Exam: Physical Exam  ROS  Blood pressure 115/83, pulse (!) 104, temperature 97.6 F (36.4 C), temperature source Oral, resp. rate 18, SpO2 100 %.There is no height or weight on file to calculate BMI.  General Appearance: Disheveled  Eye Contact:  Minimal  Speech:  Pressured  Volume:  Increased  Mood:  Irritable and Manic and disorganized  Affect:  Congruent  Thought Process:  Disorganized,  Irrelevant and Descriptions of Associations: Loose  Orientation:  Other:  Person place general situation and time of day  Thought Content:  Illogical  Suicidal Thoughts:  No  Homicidal Thoughts:  No  Memory: Short-term impaired 1 of 3 - 0 of 3  Judgement:  Impaired  Insight:  Shallow  Psychomotor Activity:  Increased  Concentration:  Concentration: Poor and Attention Span: Poor  Recall:  Poor  Fund of Knowledge:  Poor  Language:  Disorganized reflecting manic thought pattern and disorganized thought  Akathisia:  Negative  Handed:  Right  AIMS (if indicated):     Assets:  Manufacturing systems engineerCommunication Skills Housing Physical Health Resilience Social Support Transportation  ADL's:  Intact  Cognition:  Impaired,  Mild  Sleep:  Number of Hours: 4.75     Treatment Plan Summary: Daily contact with patient to assess and evaluate symptoms and progress in treatment and Medication management we will use IM meds acutely, again she would not be discharged without long-acting injectable continue cognitive and reality based therapy.  Will address sleep as well  Malvin JohnsFARAH,Kaisy Severino, MD 09/28/2018, 10:56 AM

## 2018-09-28 NOTE — Progress Notes (Signed)
[name], female   DOB: 1975/03/10, 43 y.o.   MRN: [id number]   Nursing Shift Note 7a-7p  Patient has been up at the nurses's station since change of shift with pressured, tangential speech and flight of ideas. Patient is loud at times almost yelling and has required constant redirection by staff. Patient also observed chanting and singing in the dayroom and is escalating the behaviors of her peers. Patient provided scheduled oral medications and one time 4mg  Ativan IM per MD order. Patient agreeable to taking medications. Patient remains delusional and fixated on "all these hoes sleeping around, where all these babies coming from?" q15 minute safety checks remain in place. High fall risk precautions initiated. No medication side effects observed at this time. Will continue to support and monitor with plan of care.

## 2018-09-28 NOTE — Plan of Care (Signed)
  Problem: Education: Goal: Knowledge of the prescribed therapeutic regimen will improve Outcome: Progressing   Problem: Health Behavior/Discharge Planning: Goal: Compliance with prescribed medication regimen will improve Outcome: Progressing   Problem: Safety: Goal: Ability to remain free from injury will improve Outcome: Progressing

## 2018-09-28 NOTE — Progress Notes (Signed)
[facility]  09/28/2018 8:08 AM Barbara SenterChanda D [name]  MRN:  [id number] Subjective:   Patient remains manic/disruptive to the ward/talking pretty much nonstop requiring near constant redirection.  Every time she sees me she wants to engage me in random statements stating no real coherent thoughts just random statements over and over "I am working on a tattoo" "I had some skin grafts" "I am too fat" just stating numerous random things but she also interrupts when I am trying to interview other patients and comes in their room screaming "he lies" again very disruptive we do not think the Saphris has been successful in the acute phase of course it may take time but because she is so disruptive when to use some IM Ativan this morning. Principal Problem: Schizoaffective disorder, [name] Diagnosis: Principal Problem:   Schizoaffective disorder, [name] Active Problems:   Cannabis use disorder, severe, dependence (HCC)   Schizoaffective disorder (HCC)  Total Time spent with patient: 20 minutes  Past Medical History:  Past Medical History:  Diagnosis Date  . Anxiety   . Bipolar affective (HCC)   . OCD (obsessive compulsive disorder)    No past surgical history on file. Family History:  Family History  Problem Relation Age of Onset  . Drug abuse Father   . Mental illness Other    Family Psychiatric  History: no new data Social History:  Social History   Substance and Sexual Activity  Alcohol Use Yes   Comment: socially     Social History   Substance and Sexual Activity  Drug Use Yes  . Types: Marijuana   Comment: occasionally    Social History   Socioeconomic History  . Marital status: Single    Spouse name: Not on file  . Number of children: Not on file  . Years of education: Not on file  . Highest education level: Not on file  Occupational History  . Occupation: Disability  Social Needs  . Financial resource strain: Not on file  . Food  insecurity    Worry: Not on file    Inability: Not on file  . Transportation needs    Medical: Not on file    Non-medical: Not on file  Tobacco Use  . Smoking status: Current Every Day Smoker    Packs/day: 1.00    Years: 1.00    Pack years: 1.00    Types: Cigarettes  . Smokeless tobacco: Never Used  Substance and Sexual Activity  . Alcohol use: Yes    Comment: socially  . Drug use: Yes    Types: Marijuana    Comment: occasionally  . Sexual activity: Never    Birth control/protection: None  Lifestyle  . Physical activity    Days per week: Not on file    Minutes per session: Not on file  . Stress: Not on file  Relationships  . Social Musicianconnections    Talks on phone: Not on file    Gets together: Not on file    Attends religious service: Not on file    Active member of club or organization: Not on file    Attends meetings of clubs or organizations: Not on file    Relationship status: Not on file  Other Topics Concern  . Not on file  Social History Narrative   Pt stated that she lives with her husband and two children.  Not verified.  Pt is followed by Dr. Jeannine [name].   Additional Social History:  Pain Medications: See MAR Prescriptions: See MAR Over the Counter: See MAR History of alcohol / drug use?: Yes Name of Substance 1: Marijuana 1 - Amount (size/oz): ''a lot'' 1 - Duration: Ongoing                  Sleep: Good  Appetite:  Good  Current Medications: Current Facility-Administered Medications  Medication Dose Route Frequency Provider Last Rate Last Dose  . acetaminophen (TYLENOL) tablet 650 mg  650 mg Oral Q6H PRN Laveda Abbe, NP   650 mg at 09/27/18 2344  . alum & mag hydroxide-simeth (MAALOX/MYLANTA) 200-200-20 MG/5ML suspension 30 mL  30 mL Oral Q4H PRN Laveda Abbe, NP      . diphenhydrAMINE (BENADRYL) capsule 50 mg  50 mg Oral TID Malvin Johns, MD      . haloperidol (HALDOL) tablet 10 mg  10 mg Oral TID Malvin Johns, MD      .  lithium carbonate (LITHOBID) CR tablet 300 mg  300 mg Oral Q12H Laveda Abbe, NP   300 mg at 09/28/18 0743  . LORazepam (ATIVAN) injection 4 mg  4 mg Intramuscular Once Malvin Johns, MD      . magnesium hydroxide (MILK OF MAGNESIA) suspension 30 mL  30 mL Oral Daily PRN Laveda Abbe, NP      . traZODone (DESYREL) tablet 300 mg  300 mg Oral QHS Malvin Johns, MD        Lab Results:  Results for orders placed or performed during the hospital encounter of 09/26/18 (from the past 48 hour(s))  SARS Coronavirus 2 (CEPHEID - Performed in [facility] hospital lab), Hosp Order     Status: None   Collection Time: 09/26/18  9:15 PM   Specimen: Nasopharyngeal Swab  Result Value Ref Range   SARS Coronavirus 2 NEGATIVE NEGATIVE    Comment: (NOTE) If result is NEGATIVE SARS-CoV-2 target nucleic acids are NOT DETECTED. The SARS-CoV-2 RNA is generally detectable in upper and lower  respiratory specimens during the acute phase of infection. The lowest  concentration of SARS-CoV-2 viral copies this assay can detect is 250  copies / mL. A negative result does not preclude SARS-CoV-2 infection  and should not be used as the sole basis for treatment or other  patient management decisions.  A negative result may occur with  improper specimen collection / handling, submission of specimen other  than nasopharyngeal swab, presence of viral mutation(s) within the  areas targeted by this assay, and inadequate number of viral copies  (<250 copies / mL). A negative result must be combined with clinical  observations, patient history, and epidemiological information. If result is POSITIVE SARS-CoV-2 target nucleic acids are DETECTED. The SARS-CoV-2 RNA is generally detectable in upper and lower  respiratory specimens dur ing the acute phase of infection.  Positive  results are indicative of active infection with SARS-CoV-2.  Clinical  correlation with patient history and other diagnostic  information is  necessary to determine patient infection status.  Positive results do  not rule out bacterial infection or co-infection with other viruses. If result is PRESUMPTIVE POSTIVE SARS-CoV-2 nucleic acids MAY BE PRESENT.   A presumptive positive result was obtained on the submitted specimen  and confirmed on repeat testing.  While 2019 novel coronavirus  (SARS-CoV-2) nucleic acids may be present in the submitted sample  additional confirmatory testing may be necessary for epidemiological  and / or clinical management purposes  to differentiate between  SARS-CoV-2 and other  Sarbecovirus currently known to infect humans.  If clinically indicated additional testing with an alternate test  methodology [phone]) is advised. The SARS-CoV-2 RNA is generally  detectable in upper and lower respiratory sp ecimens during the acute  phase of infection. The expected result is Negative. Fact Sheet for Patients:  StrictlyIdeas.no Fact Sheet for Healthcare Providers: BankingDealers.co.za This test is not yet approved or cleared by the [place] FDA and has been authorized for detection and/or diagnosis of SARS-CoV-2 by FDA under an Emergency Use Authorization (EUA).  This EUA will remain in effect (meaning this test can be used) for the duration of the COVID-19 declaration under Section 564(b)(1) of the Act, 21 U.S.C. section 360bbb-3(b)(1), unless the authorization is terminated or revoked sooner. Performed at [facility], [place] [address] [place], [place] [address]   CBC     Status: Abnormal   Collection Time: 09/27/18  6:55 AM  Result Value Ref Range   WBC 7.8 4.0 - 10.5 K/uL   RBC 4.47 3.87 - 5.11 MIL/uL   Hemoglobin 12.2 12.0 - 15.0 g/dL   HCT 37.7 36.0 - 46.0 %   MCV 84.3 80.0 - 100.0 fL   MCH 27.3 26.0 - 34.0 pg   MCHC 32.4 30.0 - 36.0 g/dL   RDW 16.0 (H) 11.5 - 15.5 %   Platelets 293 150 - 400 K/uL   nRBC  0.0 0.0 - 0.2 %    Comment: Performed at [facility], [place] [address] [place], [place] [address]  Comprehensive metabolic panel     Status: Abnormal   Collection Time: 09/27/18  6:55 AM  Result Value Ref Range   Sodium 141 135 - 145 mmol/L   Potassium 3.7 3.5 - 5.1 mmol/L   Chloride 108 98 - 111 mmol/L   CO2 23 22 - 32 mmol/L   Glucose, Bld 124 (H) 70 - 99 mg/dL   BUN 28 (H) 6 - 20 mg/dL   Creatinine, Ser 1.32 (H) 0.44 - 1.00 mg/dL   Calcium 9.5 8.9 - 10.3 mg/dL   Total Protein 8.6 (H) 6.5 - 8.1 g/dL   Albumin 4.5 3.5 - 5.0 g/dL   AST 28 15 - 41 U/L   ALT 24 0 - 44 U/L   Alkaline Phosphatase 56 38 - 126 U/L   Total Bilirubin 0.4 0.3 - 1.2 mg/dL   GFR calc non Af Amer 50 (L) >60 mL/min   GFR calc Af Amer 58 (L) >60 mL/min   Anion gap 10 5 - 15    Comment: Performed at [facility], [place] [address], [place], [place] [address]  Hemoglobin A1c     Status: Abnormal   Collection Time: 09/27/18  6:55 AM  Result Value Ref Range   Hgb A1c MFr Bld 6.1 (H) 4.8 - 5.6 %    Comment: (NOTE) Pre diabetes:          5.7%-6.4% Diabetes:              >6.4% Glycemic control for   <7.0% adults with diabetes    Mean Plasma Glucose 128.37 mg/dL    Comment: Performed at [place] [address] [place], [place] [address]  Ethanol     Status: None   Collection Time: 09/27/18  6:55 AM  Result Value Ref Range   Alcohol, Ethyl (B) <10 <10 mg/dL    Comment: (NOTE) Lowest detectable limit for serum alcohol is 10 mg/dL. For medical purposes only. Performed  at [facility], [address] [place], [place] [address]   Lipid panel     Status: Abnormal   Collection Time: 09/27/18  6:55 AM  Result Value Ref Range   Cholesterol 168 0 - 200 mg/dL   Triglycerides 469117 <629<150 mg/dL   HDL 22 (L) >52>40 mg/dL   Total CHOL/HDL Ratio 7.6 RATIO   VLDL 23 0 - 40 mg/dL   LDL Cholesterol 841123 (H) 0 - 99 mg/dL    Comment:        Total  Cholesterol/HDL:CHD Risk Coronary Heart Disease Risk Table                     Men   Women  1/2 Average Risk   3.4   3.3  Average Risk       5.0   4.4  2 X Average Risk   9.6   7.1  3 X Average Risk  23.4   11.0        Use the calculated Patient Ratio above and the CHD Risk Table to determine the patient's CHD Risk.        ATP III CLASSIFICATION (LDL):  <100     mg/dL   Optimal  [phone]  mg/dL   Near or Above                    Optimal  130-159  mg/dL   Borderline  [phone]  mg/dL   High  >664>190     mg/dL   Very High Performed at [facility], [address] [place], [place] [address]   TSH     Status: None   Collection Time: 09/27/18  6:55 AM  Result Value Ref Range   TSH 1.400 0.350 - 4.500 uIU/mL    Comment: Performed by a 3rd Generation assay with a functional sensitivity of <=0.01 uIU/mL. Performed at [facility], [address] [place], [place] [address]   Prolactin     Status: None   Collection Time: 09/27/18  6:55 AM  Result Value Ref Range   Prolactin 15.1 4.8 - 23.3 ng/mL    Comment: (NOTE) Performed At: [facility] [place] [address] [place], [place] [id number] Jolene SchimkeNagendra Sanjai MD IR:[id number]:[phone]   Lithium level     Status: Abnormal   Collection Time: 09/27/18  6:55 AM  Result Value Ref Range   Lithium Lvl <0.06 (L) 0.60 - 1.20 mmol/L    Comment: REPEATED TO VERIFY Performed at [facility], [address] [place], [place] [address]     Blood Alcohol level:  Lab Results  Component Value Date   [facility] <10 09/27/2018   ETH <10 07/05/2017    Metabolic Disorder Labs: Lab Results  Component Value Date   HGBA1C 6.1 (H) 09/27/2018   MPG 128.37 09/27/2018   MPG 140 09/30/2014   Lab Results  Component Value Date   PROLACTIN 15.1 09/27/2018   PROLACTIN 150.0 (H) 08/22/2014   Lab Results  Component Value Date   CHOL 168 09/27/2018   TRIG 117 09/27/2018   HDL 22 (L) 09/27/2018   CHOLHDL 7.6  09/27/2018   VLDL 23 09/27/2018   LDLCALC 123 (H) 09/27/2018   LDLCALC 80 09/30/2014    Physical Findings: AIMS:  , ,  ,  ,    CIWA:  CIWA-Ar Total: 0 COWS:     Musculoskeletal: Strength & Muscle Tone: within normal limits Gait & Station: normal Patient  leans: N/A  Psychiatric Specialty Exam: Physical Exam  ROS  Blood pressure 115/83, pulse (!) 104, temperature 97.6 F (36.4 C), temperature source Oral, resp. rate 18, SpO2 100 %.There is no height or weight on file to calculate BMI.  General Appearance: Casual  Eye Contact:  Fair  Speech:  Pressured  Volume:  Increased  Mood:  Manic and labile  Affect:  Labile  Thought Process:  Irrelevant and Descriptions of Associations: Loose  Orientation:  Other:  Does not know the exact date but person place situation  Thought Content:  Illogical, Delusions and Tangential  Suicidal Thoughts:  No  Homicidal Thoughts:  No  Memory:  Immediate;   Poor  Judgement:  Impaired  Insight:  Lacking  Psychomotor Activity: increased   Concentration:  Concentration: Poor  Recall:  Poor  Fund of Knowledge:  Poor  Language:  Poor  Akathisia:  Negative  Handed:  Right  AIMS (if indicated):     Assets:  Physical Health Resilience  ADL's:  Intact  Cognition:  WNL  Sleep:  Number of Hours: 4.75     Treatment Plan Summary: Daily contact with patient to assess and evaluate symptoms and progress in treatment, Medication management and Plan Patient states she is allergic to "everything" including Risperdal Haldol which is not true I have seen her take Risperdal before so we cannot count these is true allergies we need a more potent antipsychotic and we also need long-acting injectable.  Malvin JohnsFARAH,Chrisie Jankovich, MD 09/28/2018, 8:08 AM

## 2018-09-28 NOTE — Tx Team (Signed)
Interdisciplinary Treatment and Diagnostic Plan Update  09/28/2018 Time of Session: 09:08am Barbara SenterChanda D [name] MRN: [id number]  Principal Diagnosis: Schizoaffective disorder, [name]  Secondary Diagnoses: Principal Problem:   Schizoaffective disorder, [name] Active Problems:   Cannabis use disorder, severe, dependence (HCC)   Schizoaffective disorder (HCC)   Current Medications:  Current Facility-Administered Medications  Medication Dose Route Frequency Provider Last Rate Last Dose  . acetaminophen (TYLENOL) tablet 650 mg  650 mg Oral Q6H PRN Laveda AbbeParks, Laurie Britton, NP   650 mg at 09/27/18 2344  . alum & mag hydroxide-simeth (MAALOX/MYLANTA) 200-200-20 MG/5ML suspension 30 mL  30 mL Oral Q4H PRN Laveda AbbeParks, Laurie Britton, NP      . diphenhydrAMINE (BENADRYL) capsule 50 mg  50 mg Oral TID Malvin JohnsFarah, Brian, MD   50 mg at 09/28/18 [id number]  . haloperidol (HALDOL) tablet 10 mg  10 mg Oral TID Malvin JohnsFarah, Brian, MD   10 mg at 09/28/18 0829  . haloperidol (HALDOL) tablet 5 mg  5 mg Oral Q6H PRN Malvin JohnsFarah, Brian, MD       Or  . haloperidol lactate (HALDOL) injection 10 mg  10 mg Intramuscular Q6H PRN Malvin JohnsFarah, Brian, MD      . lithium carbonate (LITHOBID) CR tablet 300 mg  300 mg Oral Q12H Laveda AbbeParks, Laurie Britton, NP   300 mg at 09/28/18 0743  . LORazepam (ATIVAN) tablet 2 mg  2 mg Oral Q4H PRN Malvin JohnsFarah, Brian, MD       Or  . LORazepam (ATIVAN) injection 2 mg  2 mg Intramuscular Q4H PRN Malvin JohnsFarah, Brian, MD      . magnesium hydroxide (MILK OF MAGNESIA) suspension 30 mL  30 mL Oral Daily PRN Laveda AbbeParks, Laurie Britton, NP      . traZODone (DESYREL) tablet 300 mg  300 mg Oral QHS Malvin JohnsFarah, Brian, MD       PTA Medications: Medications Prior to Admission  Medication Sig Dispense Refill Last Dose  . asenapine (SAPHRIS) 5 MG SUBL 24 hr tablet Place 2 tablets (10 mg total) under the tongue 2 (two) times daily. For mood copntrol 60 tablet 0   . benztropine (COGENTIN) 1 MG tablet Take 1 tablet (1 mg total) by mouth 2  (two) times daily as needed (EPS). (Patient taking differently: Take 1 mg by mouth 2 (two) times daily. ) 60 tablet 0   . fluPHENAZine (PROLIXIN) 10 MG tablet Take 10 mg by mouth daily.     . hydrOXYzine (ATARAX/VISTARIL) 50 MG tablet Take 1 tablet (50 mg total) by mouth every 6 (six) hours as needed for anxiety. 60 tablet 0   . lithium carbonate 300 MG capsule Take 600 mg by mouth 2 (two) times daily with a meal.      . LORazepam (ATIVAN) 1 MG tablet Take 1 tablet (1 mg total) by mouth every 6 (six) hours as needed (agitation). (Patient taking differently: Take 1 mg by mouth 2 (two) times daily. ) 16 tablet 0   . traZODone (DESYREL) 100 MG tablet Take 1 tablet (100 mg total) by mouth at bedtime as needed for sleep. For sleep 30 tablet 0   . fluPHENAZine decanoate (PROLIXIN) 25 MG/ML injection Inject 0.5 mLs (12.5 mg total) into the muscle every 14 (fourteen) days. (Due on 07-25-17): For mood control (Patient not taking: Reported on 09/27/2018) 5 mL 0 Not Taking at Unknown time  . nicotine (NICODERM CQ - DOSED IN MG/24 HOURS) 21 mg/24hr patch Place 1 patch (21 mg total) onto the skin daily. (  May buy from over the counter): For smoking cessation (Patient not taking: Reported on 09/27/2018) 28 patch 0 Not Taking at Unknown time    Patient Stressors: Health problems Medication change or noncompliance  Patient Strengths: Average or above average intelligence Capable of independent living Supportive family/friends  Treatment Modalities: Medication Management, Group therapy, Case management,  1 to 1 session with clinician, Psychoeducation, Recreational therapy.   Physician Treatment Plan for Primary Diagnosis: Schizoaffective disorder, [name] Long Term Goal(s):     Short Term Goals:    Medication Management: Evaluate patient's response, side effects, and tolerance of medication regimen.  Therapeutic Interventions: 1 to 1 sessions, Unit Group sessions and Medication  administration.  Evaluation of Outcomes: Not Progressing  Physician Treatment Plan for Secondary Diagnosis: Principal Problem:   Schizoaffective disorder, [name] Active Problems:   Cannabis use disorder, severe, dependence (HCC)   Schizoaffective disorder (HCC)  Long Term Goal(s):     Short Term Goals:       Medication Management: Evaluate patient's response, side effects, and tolerance of medication regimen.  Therapeutic Interventions: 1 to 1 sessions, Unit Group sessions and Medication administration.  Evaluation of Outcomes: Not Progressing   RN Treatment Plan for Primary Diagnosis: Schizoaffective disorder, [name] Long Term Goal(s): Knowledge of disease and therapeutic regimen to maintain health will improve  Short Term Goals: Ability to participate in decision making will improve, Ability to verbalize feelings will improve, Ability to disclose and discuss suicidal ideas, Ability to identify and develop effective coping behaviors will improve and Compliance with prescribed medications will improve  Medication Management: RN will administer medications as ordered by provider, will assess and evaluate patient's response and provide education to patient for prescribed medication. RN will report any adverse and/or side effects to prescribing provider.  Therapeutic Interventions: 1 on 1 counseling sessions, Psychoeducation, Medication administration, Evaluate responses to treatment, Monitor vital signs and CBGs as ordered, Perform/monitor CIWA, COWS, AIMS and Fall Risk screenings as ordered, Perform wound care treatments as ordered.  Evaluation of Outcomes: Not Progressing   LCSW Treatment Plan for Primary Diagnosis: Schizoaffective disorder, [name] Long Term Goal(s): Safe transition to appropriate next level of care at discharge, Engage patient in therapeutic group addressing interpersonal concerns.  Short Term Goals: Engage patient in aftercare  planning with referrals and resources and Increase skills for wellness and recovery  Therapeutic Interventions: Assess for all discharge needs, 1 to 1 time with Social worker, Explore available resources and support systems, Assess for adequacy in community support network, Educate family and significant other(s) on suicide prevention, Complete Psychosocial Assessment, Interpersonal group therapy.  Evaluation of Outcomes: Not Progressing   Progress in Treatment: Attending groups: No. Participating in groups: No. Taking medication as prescribed: Yes. Toleration medication: Yes. Family/Significant other contact made: No, will contact:  will contact if given consent to contact Patient understands diagnosis: No. Discussing patient identified problems/goals with staff: Yes. Medical problems stabilized or resolved: Yes. Denies suicidal/homicidal ideation: Yes. Issues/concerns per patient self-inventory: No. Other:   New problem(s) identified: No, Describe:  None  New Short Term/Long Term Goal(s): Medication stabilization, elimination of SI thoughts, and development of a comprehensive mental wellness plan.   Patient Goals:    Discharge Plan or Barriers: CSW will continue to follow up for appropriate referrals and possible discharge planning  Reason for Continuation of Hospitalization: Aggression Delusions  Medication stabilization  Estimated Length of Stay: 3-5 days   Attendees: Patient: 09/28/2018   Physician: Dr. Malvin JohnsBrian Farah,  MD 09/28/2018  Nursing: Estill Bamberg, RN 09/28/2018   RN Care Manager: 09/28/2018   Social Worker: Ardelle Anton, LCSW 09/28/2018   Recreational Therapist:  09/28/2018   Other:  09/28/2018   Other:  09/28/2018   Other: 09/28/2018      Scribe for Treatment Team: Trecia Rogers, LCSW 09/28/2018 10:08 AM

## 2018-09-29 MED ORDER — NICOTINE 21 MG/24HR TD PT24
21.0000 mg | MEDICATED_PATCH | Freq: Every day | TRANSDERMAL | Status: DC
  Administered 2018-09-29 – 2018-10-04 (×6): 21 mg via TRANSDERMAL
  Filled 2018-09-29 (×8): qty 1

## 2018-09-29 MED ORDER — OLANZAPINE 5 MG PO TBDP: 5.0000 mg | ORAL_TABLET | Freq: Three times a day (TID) | ORAL | Status: DC | PRN

## 2018-09-29 NOTE — [facility] Group Notes (Signed)
  [facility] LCSW Group Therapy Note  Date/Time: 09/29/2018 @ 10am  Type of Therapy/Topic:  Group Therapy:  Emotion Regulation  Participation Level:  Active   Mood: Pleasant  Description of Group:    The purpose of this group is to assist patients in learning to regulate negative emotions and experience positive emotions. Patients will be guided to discuss ways in which they have been vulnerable to their negative emotions. These vulnerabilities will be juxtaposed with experiences of positive emotions or situations, and patients challenged to use positive emotions to combat negative ones. Special emphasis will be placed on coping with negative emotions in conflict situations, and patients will process healthy conflict resolution skills.  Therapeutic Goals: 1. Patient will identify two positive emotions or experiences to reflect on in order to balance out negative emotions:  2. Patient will label two or more emotions that they find the most difficult to experience:  3. Patient will be able to demonstrate positive conflict resolution skills through discussion or role plays:   Summary of Patient Progress:   Patient is active and engaged throughout group therapy. Patient was able to reflect on experiences that help her balance out negative emotions which are hanging out with friends, music and the beach. Patient states that she is able to keep positive emotions when these experiences/activities. Patient was able to identify two emotions that she finds most difficult to experience which are: fear and anger. Pt reports that helping others also helps her to focus on positive emotions.     Therapeutic Modalities:   Cognitive Behavioral Therapy Feelings Identification Dialectical Behavioral Therapy   Ardelle Anton, LCSW

## 2018-09-29 NOTE — [facility] Suicide Risk Assessment (Signed)
[facility] INPATIENT:  Family/Significant Other Suicide Prevention Education  Suicide Prevention Education:  Patient Refusal for Family/Significant Other Suicide Prevention Education: The patient [name] has refused to provide written consent for family/significant other to be provided Family/Significant Other Suicide Prevention Education during admission and/or prior to discharge.  Physician notified.  Trecia Rogers 09/29/2018, 1:21 PM

## 2018-09-29 NOTE — Progress Notes (Addendum)
[facility] MD Progress Note  09/29/2018 8:14 AM [name]  MRN:  [id number] Subjective:  "Tell Dr. Jake Samples I need to get out of here."  [name] found lying in bed. She is loud, pressured, tangential, and irritable. She is demanding to leave- "What's the point in an ACT team if I have to be in the hospital? I don't need to be here." She is intrusive and interrupts staff trying to interact with other patients, difficult to redirect.  She received 4 mg IM Ativan yesterday due to agitation. She denies all symptoms, SI/HI/AVH and refuses most questions but continues to insist "get me out of here." She has been refusing Haldol but compliant with lithium. Record shows 5.75 hours of sleep overnight, but she slept for most of yesterday after PRN medications.   From admission H&P: Barbara D Lindsayis an 43 y.o.female.who presents to [facility] as a walk-in, voluntarily, although transported by ambulance. When asked how did she arrive to the hospital she replied, " Kanye West." Out of the blue she yells, " Slim shady to the rescue." This is a patient of Dr. Jake Samples (ACT team). She has had multiple admissions to [facility] with most recent per chart review dated 07/2017.  Principal Problem: Schizoaffective disorder, bipolar type ([place]) Diagnosis: Principal Problem:   Schizoaffective disorder, bipolar type ([place]) Active Problems:   Cannabis use disorder, severe, dependence ([place])   Schizoaffective disorder ([place])  Total Time spent with patient: 15 minutes  Past Psychiatric History: See admission H&P  Past Medical History:  Past Medical History:  Diagnosis Date  . Anxiety   . Bipolar affective ([place])   . OCD (obsessive compulsive disorder)    No past surgical history on file. Family History:  Family History  Problem Relation Age of Onset  . Drug abuse Father   . Mental illness Other    Family Psychiatric  History: See admission H&P Social History:  Social History   Substance and Sexual Activity  Alcohol  Use Yes   Comment: socially     Social History   Substance and Sexual Activity  Drug Use Yes  . Types: Marijuana   Comment: occasionally    Social History   Socioeconomic History  . Marital status: Single    Spouse name: Not on file  . Number of children: Not on file  . Years of education: Not on file  . Highest education level: Not on file  Occupational History  . Occupation: Disability  Social Needs  . Financial resource strain: Not on file  . Food insecurity    Worry: Not on file    Inability: Not on file  . Transportation needs    Medical: Not on file    Non-medical: Not on file  Tobacco Use  . Smoking status: Current Every Day Smoker    Packs/day: 1.00    Years: 1.00    Pack years: 1.00    Types: Cigarettes  . Smokeless tobacco: Never Used  Substance and Sexual Activity  . Alcohol use: Yes    Comment: socially  . Drug use: Yes    Types: Marijuana    Comment: occasionally  . Sexual activity: Never    Birth control/protection: None  Lifestyle  . Physical activity    Days per week: Not on file    Minutes per session: Not on file  . Stress: Not on file  Relationships  . Social Herbalist on phone: Not on file    Gets together: Not  on file    Attends religious service: Not on file    Active member of club or organization: Not on file    Attends meetings of clubs or organizations: Not on file    Relationship status: Not on file  Other Topics Concern  . Not on file  Social History Narrative   Pt stated that she lives with her husband and two children.  Not verified.  Pt is followed by Dr. [name].   Additional Social History:    Pain Medications: See MAR Prescriptions: See MAR Over the Counter: See MAR History of alcohol / drug use?: Yes Name of Substance 1: Marijuana 1 - Amount (size/oz): ''a lot'' 1 - Duration: Ongoing                  Sleep: Fair  Appetite:  Fair  Current Medications: Current Facility-Administered  Medications  Medication Dose Route Frequency Provider Last Rate Last Dose  . acetaminophen (TYLENOL) tablet 650 mg  650 mg Oral Q6H PRN Laveda AbbeParks, Laurie Britton, NP   650 mg at 09/29/18 0359  . alum & mag hydroxide-simeth (MAALOX/MYLANTA) 200-200-20 MG/5ML suspension 30 mL  30 mL Oral Q4H PRN Laveda AbbeParks, Laurie Britton, NP      . diphenhydrAMINE (BENADRYL) capsule 50 mg  50 mg Oral TID Malvin JohnsFarah, Brian, MD   50 mg at 09/29/18 [id number]  . haloperidol (HALDOL) tablet 10 mg  10 mg Oral TID Malvin JohnsFarah, Brian, MD   10 mg at 09/28/18 1630  . haloperidol (HALDOL) tablet 5 mg  5 mg Oral Q6H PRN Malvin JohnsFarah, Brian, MD       Or  . haloperidol lactate (HALDOL) injection 10 mg  10 mg Intramuscular Q6H PRN Malvin JohnsFarah, Brian, MD      . lithium carbonate (LITHOBID) CR tablet 300 mg  300 mg Oral Q12H Laveda AbbeParks, Laurie Britton, NP   300 mg at 09/29/18 0806  . LORazepam (ATIVAN) tablet 2 mg  2 mg Oral Q4H PRN Malvin JohnsFarah, Brian, MD   2 mg at 09/29/18 [id number]   Or  . LORazepam (ATIVAN) injection 2 mg  2 mg Intramuscular Q4H PRN Malvin JohnsFarah, Brian, MD      . magnesium hydroxide (MILK OF MAGNESIA) suspension 30 mL  30 mL Oral Daily PRN Laveda AbbeParks, Laurie Britton, NP      . traZODone (DESYREL) tablet 400 mg  400 mg Oral QHS Malvin JohnsFarah, Brian, MD   400 mg at 09/28/18 2046    Lab Results: No results found for this or any previous visit (from the past 48 hour(s)).  Blood Alcohol level:  Lab Results  Component Value Date   ETH <10 09/27/2018   ETH <10 07/05/2017    Metabolic Disorder Labs: Lab Results  Component Value Date   HGBA1C 6.1 (H) 09/27/2018   MPG 128.37 09/27/2018   MPG 140 09/30/2014   Lab Results  Component Value Date   PROLACTIN 15.1 09/27/2018   PROLACTIN 150.0 (H) 08/22/2014   Lab Results  Component Value Date   CHOL 168 09/27/2018   TRIG 117 09/27/2018   HDL 22 (L) 09/27/2018   CHOLHDL 7.6 09/27/2018   VLDL 23 09/27/2018   LDLCALC 123 (H) 09/27/2018   LDLCALC 80 09/30/2014    Physical Findings: AIMS: Facial and Oral Movements Muscles  of Facial Expression: None, normal Lips and Perioral Area: None, normal Jaw: None, normal Tongue: None, normal,Extremity Movements Upper (arms, wrists, hands, fingers): None, normal Lower (legs, knees, ankles, toes): None, normal, Trunk Movements Neck, shoulders, hips: None, normal, Overall  Severity Severity of abnormal movements (highest score from questions above): None, normal Incapacitation due to abnormal movements: None, normal Patient's awareness of abnormal movements (rate only patient's report): No Awareness, Dental Status Current problems with teeth and/or dentures?: No Does patient usually wear dentures?: No  CIWA:  CIWA-Ar Total: 0 COWS:     Musculoskeletal: Strength & Muscle Tone: within normal limits Gait & Station: normal Patient leans: N/A  Psychiatric Specialty Exam: Physical Exam  Nursing note and vitals reviewed. Constitutional: She is oriented to person, place, and time. She appears well-developed and well-nourished.  Cardiovascular: Normal rate.  Respiratory: Effort normal.  Neurological: She is alert and oriented to person, place, and time.    Review of Systems  Constitutional: Negative.   Psychiatric/Behavioral: Negative for depression, hallucinations, substance abuse and suicidal ideas. The patient is not nervous/anxious and does not have insomnia.     Blood pressure 115/83, pulse (!) 104, temperature 97.6 F (36.4 C), temperature source Oral, resp. rate 18, SpO2 100 %.There is no height or weight on file to calculate BMI.  General Appearance: Disheveled  Eye Contact:  Minimal  Speech:  Pressured  Volume:  Increased  Mood:  Irritable  Affect:  Congruent  Thought Process:  Disorganized  Orientation:  Full (Time, Place, and Person)  Thought Content:  Tangential  Suicidal Thoughts:  No  Homicidal Thoughts:  No  Memory:  Immediate;   Fair Recent;   Fair  Judgement:  Impaired  Insight:  Lacking  Psychomotor Activity:  Normal  Concentration:   Concentration: Poor  Recall:  FiservFair  Fund of Knowledge:  Fair  Language:  Fair  Akathisia:  No  Handed:  Right  AIMS (if indicated):     Assets:  ArchitectCommunication Skills Financial Resources/Insurance Resilience  ADL's:  Intact  Cognition:  WNL  Sleep:  Number of Hours: 5.75     Treatment Plan Summary: Daily contact with patient to assess and evaluate symptoms and progress in treatment and Medication management   Continue inpatient hospitalization. Continue to encourage medication adherence. Check lithium level.  Continue Haldol 10 mg PO TID for psychosis Continue Benadryl 50 mg PO TID for EPS Continue lithium 300 mg PO BID for mood stability Continue trazodone 400 mg PO QHS for insomnia Continue Ativan 2 mg PO Q4HR PRN agitation Continue Haldol 10 mg IM Q6HR PRN agitation  Patient will participate in the therapeutic group milieu.  Discharge disposition in progress.   Aldean BakerJanet E Sykes, NP 09/29/2018, 8:14 AM   Agree with NP progress note
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
## 2018-09-29 NOTE — Progress Notes (Signed)
D: Patient presents animated, labile, argumentative. She slept well last night and received medication that was helpful. Patient denies SI/HI/AVH. She refused her haldol. Her appetite is good, energy normal and concentration good. She rates her depression 2/10. She denies withdrawal symptoms, but complains of dizziness. Goal: "get discharged." A: Patient checked q15 min, and checks reviewed. Reviewed medication changes with patient and educated on side effects. Educated patient on importance of attending group therapy sessions and educated on several coping skills. Encouarged participation in milieu through recreation therapy and attending meals with peers. Support and encouragement provided. Fluids offered. R: Patient receptive to education on medications, but is noncompliant with her haldol. She states she has a severe reaction to the medication of EPS symptoms. Patient contracts for safety on the unit.

## 2018-09-30 NOTE — Progress Notes (Addendum)
Patient has been up at nursing station talking loudly, intrusive and not following directions when asked to move away from the nursing station. She is fixated on wanting to leave and she is upset with Dr. Alanson Aly for not discharging her. She was requesting her sleep medication shortly after shift change. She was compliant with scheduled medications. Safety maintained on unit with 15 min checks.

## 2018-09-30 NOTE — Progress Notes (Signed)
Pt did not attend wrap-up group   

## 2018-09-30 NOTE — Progress Notes (Signed)
D: Patient is labile, irritable. She believes that Tupac is coming back from the dead, that she is dating [place] and has relations with Martyn Ehrich, although he is in prison, she says. Her delusions appear fixed, and have not improved. She repeats the same content as on admission, about crab legs at a buffet and being called "slim shady" the rapper. She is intrusive and frequently at the nurse's station requesting various things. Her behavior appears attention seeking. She directs much of her anger at the ACTT and Dr. Jake Samples as her outpatient provider. She feels we do not listen to her concerns about her medications, although she refuses any antipsychotic regimen. She states that each med gives her different symptoms that she cannot tolerate. Patient denies SI/HI/AVH.  A: Patient checked q15 min, and checks reviewed. Reviewed medication changes with patient and educated on side effects. Educated patient on importance of attending group therapy sessions and educated on several coping skills. Encouarged participation in milieu through recreation therapy and attending meals with peers. Support and encouragement provided. Fluids offered. R: Patient is compliant with lithium and benadryl, but not her haldol. Patient contracts for safety on the unit.

## 2018-09-30 NOTE — Progress Notes (Addendum)
[facility] MD Progress Note  09/30/2018 12:37 PM [name]  MRN:  [id number] Subjective:  "I'm mad at Dr. Jake Samples."  [name] found sitting in the dayroom. She is calmer on assessment today but remains pressured and loud, demanding discharge. She continues to express delusional thought content about [place] and Slim Shady. She is taking lithium but refusing the Haldol since yesterday. She reports she has an "allergy" to Haldol but cannot state what the allergy is. Alternative antipsychotic medications were discussed but with each medication that is mentioned the patient states, "No. I have an allergy to that too." She states she will refuse any antipsychotic medications. She denies SI/HI/AVH.    From admission H&P: Barbara D Lindsayis an 43 y.o.female.who presents to [facility] as a walk-in, voluntarily, although transported by ambulance. When asked how did she arrive to the hospital she replied, " Kanye West." Out of the blue she yells, " Slim shady to the rescue." This is a patient of Dr. Jake Samples (ACT team). She has had multiple admissions to [facility] with most recent per chart review dated 07/2017.  Principal Problem: Schizoaffective disorder, bipolar type ([place]) Diagnosis: Principal Problem:   Schizoaffective disorder, bipolar type ([place]) Active Problems:   Cannabis use disorder, severe, dependence ([place])   Schizoaffective disorder ([place])  Total Time spent with patient: 15 minutes  Past Psychiatric History: See admission H&P  Past Medical History:  Past Medical History:  Diagnosis Date  . Anxiety   . Bipolar affective ([place])   . OCD (obsessive compulsive disorder)    No past surgical history on file. Family History:  Family History  Problem Relation Age of Onset  . Drug abuse Father   . Mental illness Other    Family Psychiatric  History: See admission H&P Social History:  Social History   Substance and Sexual Activity  Alcohol Use Yes   Comment: socially     Social History    Substance and Sexual Activity  Drug Use Yes  . Types: Marijuana   Comment: occasionally    Social History   Socioeconomic History  . Marital status: Single    Spouse name: Not on file  . Number of children: Not on file  . Years of education: Not on file  . Highest education level: Not on file  Occupational History  . Occupation: Disability  Social Needs  . Financial resource strain: Not on file  . Food insecurity    Worry: Not on file    Inability: Not on file  . Transportation needs    Medical: Not on file    Non-medical: Not on file  Tobacco Use  . Smoking status: Current Every Day Smoker    Packs/day: 1.00    Years: 1.00    Pack years: 1.00    Types: Cigarettes  . Smokeless tobacco: Never Used  Substance and Sexual Activity  . Alcohol use: Yes    Comment: socially  . Drug use: Yes    Types: Marijuana    Comment: occasionally  . Sexual activity: Never    Birth control/protection: None  Lifestyle  . Physical activity    Days per week: Not on file    Minutes per session: Not on file  . Stress: Not on file  Relationships  . Social Herbalist on phone: Not on file    Gets together: Not on file    Attends religious service: Not on file    Active member of club or organization: Not  on file    Attends meetings of clubs or organizations: Not on file    Relationship status: Not on file  Other Topics Concern  . Not on file  Social History Narrative   Pt stated that she lives with her husband and two children.  Not verified.  Pt is followed by Dr. [name].   Additional Social History:    Pain Medications: See MAR Prescriptions: See MAR Over the Counter: See MAR History of alcohol / drug use?: Yes Name of Substance 1: Marijuana 1 - Amount (size/oz): ''a lot'' 1 - Duration: Ongoing                  Sleep: Good  Appetite:  Good  Current Medications: Current Facility-Administered Medications  Medication Dose Route Frequency Provider Last  Rate Last Dose  . acetaminophen (TYLENOL) tablet 650 mg  650 mg Oral Q6H PRN Laveda AbbeParks, Laurie Britton, NP   650 mg at 09/29/18 0359  . alum & mag hydroxide-simeth (MAALOX/MYLANTA) 200-200-20 MG/5ML suspension 30 mL  30 mL Oral Q4H PRN Laveda AbbeParks, Laurie Britton, NP      . diphenhydrAMINE (BENADRYL) capsule 50 mg  50 mg Oral TID Malvin JohnsFarah, Brian, MD   50 mg at 09/30/18 1229  . haloperidol (HALDOL) tablet 10 mg  10 mg Oral TID Malvin JohnsFarah, Brian, MD   10 mg at 09/28/18 1630  . haloperidol (HALDOL) tablet 5 mg  5 mg Oral Q6H PRN Malvin JohnsFarah, Brian, MD       Or  . haloperidol lactate (HALDOL) injection 10 mg  10 mg Intramuscular Q6H PRN Malvin JohnsFarah, Brian, MD      . lithium carbonate (LITHOBID) CR tablet 300 mg  300 mg Oral Q12H Laveda AbbeParks, Laurie Britton, NP   300 mg at 09/30/18 0817  . LORazepam (ATIVAN) tablet 2 mg  2 mg Oral Q4H PRN Malvin JohnsFarah, Brian, MD   2 mg at 09/29/18 1208   Or  . LORazepam (ATIVAN) injection 2 mg  2 mg Intramuscular Q4H PRN Malvin JohnsFarah, Brian, MD      . magnesium hydroxide (MILK OF MAGNESIA) suspension 30 mL  30 mL Oral Daily PRN Laveda AbbeParks, Laurie Britton, NP      . nicotine (NICODERM CQ - dosed in mg/24 hours) patch 21 mg  21 mg Transdermal Daily , Rockey SituFernando A, MD   21 mg at 09/30/18 0817  . OLANZapine zydis (ZYPREXA) disintegrating tablet 5 mg  5 mg Oral Q8H PRN Aldean BakerSykes, Janet E, NP      . traZODone (DESYREL) tablet 400 mg  400 mg Oral QHS Malvin JohnsFarah, Brian, MD   400 mg at 09/29/18 2045    Lab Results: No results found for this or any previous visit (from the past 48 hour(s)).  Blood Alcohol level:  Lab Results  Component Value Date   ETH <10 09/27/2018   ETH <10 07/05/2017    Metabolic Disorder Labs: Lab Results  Component Value Date   HGBA1C 6.1 (H) 09/27/2018   MPG 128.37 09/27/2018   MPG 140 09/30/2014   Lab Results  Component Value Date   PROLACTIN 15.1 09/27/2018   PROLACTIN 150.0 (H) 08/22/2014   Lab Results  Component Value Date   CHOL 168 09/27/2018   TRIG 117 09/27/2018   HDL 22 (L)  09/27/2018   CHOLHDL 7.6 09/27/2018   VLDL 23 09/27/2018   LDLCALC 123 (H) 09/27/2018   LDLCALC 80 09/30/2014    Physical Findings: AIMS: Facial and Oral Movements Muscles of Facial Expression: None, normal Lips and Perioral Area:  None, normal Jaw: None, normal Tongue: None, normal,Extremity Movements Upper (arms, wrists, hands, fingers): None, normal Lower (legs, knees, ankles, toes): None, normal, Trunk Movements Neck, shoulders, hips: None, normal, Overall Severity Severity of abnormal movements (highest score from questions above): None, normal Incapacitation due to abnormal movements: None, normal Patient's awareness of abnormal movements (rate only patient's report): No Awareness, Dental Status Current problems with teeth and/or dentures?: No Does patient usually wear dentures?: No  CIWA:  CIWA-Ar Total: 0 COWS:     Musculoskeletal: Strength & Muscle Tone: within normal limits Gait & Station: normal Patient leans: N/A  Psychiatric Specialty Exam: Physical Exam  Nursing note and vitals reviewed. Constitutional: She is oriented to person, place, and time. She appears well-developed and well-nourished.  Cardiovascular: Normal rate.  Respiratory: Effort normal.  Neurological: She is alert and oriented to person, place, and time.    Review of Systems  Constitutional: Negative.   Psychiatric/Behavioral: Negative for depression and suicidal ideas. The patient is not nervous/anxious and does not have insomnia.     Blood pressure 110/79. Heart rate 103. Respirations 18. Temperature 98.1.  General Appearance: Fairly Groomed  Eye Contact:  Good  Speech:  Pressured  Volume:  Increased  Mood:  Irritable  Affect:  Congruent  Thought Process:  Disorganized  Orientation:  Full (Time, Place, and Person)  Thought Content:  Delusions  Suicidal Thoughts:  No  Homicidal Thoughts:  No  Memory:  Immediate;   Fair Recent;   Fair  Judgement:  Impaired  Insight:  Lacking   Psychomotor Activity:  Normal  Concentration:  Concentration: Poor and Attention Span: Fair  Recall:  FiservFair  Fund of Knowledge:  Fair  Language:  Fair  Akathisia:  No  Handed:  Right  AIMS (if indicated):     Assets:  Manufacturing systems engineerCommunication Skills Housing Leisure Time  ADL's:  Intact  Cognition:  WNL  Sleep:  Number of Hours: 6.75     Treatment Plan Summary: Daily contact with patient to assess and evaluate symptoms and progress in treatment and Medication management   Continue inpatient hospitalization. Continue to encourage medication adherence. Check lithium level. Forced medication consult.  Continue Haldol 10 mg PO TID for psychosis Continue Benadryl 50 mg PO TID for EPS Continue lithium 300 mg PO BID for mood stability Continue trazodone 400 mg PO QHS for insomnia Continue Ativan 2 mg PO Q4HR PRN agitation Continue Haldol 10 mg IM Q6HR PRN agitation  Patient will participate in the therapeutic group milieu.  Discharge disposition in progress.   Aldean BakerJanet E Sykes, NP 09/30/2018, 12:37 PM   Agree with NP progress note

## 2018-09-30 NOTE — Progress Notes (Signed)
   09/30/18 0817  COVID-19 Daily Checkoff  Have you had a fever (temp > 37.80C/100F)  in the past 24 hours?  No  If you have had runny nose, nasal congestion, sneezing in the past 24 hours, has it worsened? No  COVID-19 EXPOSURE  Have you traveled outside the state in the past 14 days? No  Have you been in contact with someone with a confirmed diagnosis of COVID-19 or PUI in the past 14 days without wearing appropriate PPE? No  Have you been living in the same home as a person with confirmed diagnosis of COVID-19 or a PUI (household contact)? No  Have you been diagnosed with COVID-19? No

## 2018-09-30 NOTE — Progress Notes (Signed)
Writer has observed patient up in the dayroom tonight watching music videos and singing. She has been less intrusive and demanding. She still inquires about Dr, Jake Samples and how the ACTT team picked her up before she was able to shop at [facility].She is hopeful to discharge on tomorrow. She attended group tonight and participated. Safety maintained on unit with 15 min checks.

## 2018-10-01 LAB — LITHIUM LEVEL: Lithium Lvl: 0.58 mmol/L — ABNORMAL LOW (ref 0.60–1.20)

## 2018-10-01 MED ORDER — PALIPERIDONE PALMITATE ER 234 MG/1.5ML IM SUSY
234.0000 mg | PREFILLED_SYRINGE | Freq: Once | INTRAMUSCULAR | Status: DC
  Filled 2018-10-01: qty 1.5

## 2018-10-01 MED ORDER — ARIPIPRAZOLE ER 400 MG IM SRER
400.0000 mg | INTRAMUSCULAR | Status: DC
  Administered 2018-10-01: 15:00:00 400 mg via INTRAMUSCULAR

## 2018-10-01 NOTE — Progress Notes (Signed)
[facility] MD Progress Note  10/01/2018 10:10 AM [name]  MRN:  [id number]  [name] is a 43 year old female with a history of schizoaffective disorder/bipolar type on hospital day 5 presenting voluntarily for disorganized speech/behavior.   Subjective:  She reports she slept well but is angry at Dr. Jake Samples. She says she had just returned from the grocery store when her ACT team accosted her and brought her to [facility]. She says she likes her ACT team and recognizes that they help her but she does not need to be here currently. She says she has anaphylaxis to haldol and risperdal but the other medications make her feel better. She demands that she be discharged today. She endorses some marijuana use but denies other illicit substance use. She denies a/v hallucinations, SI, and HI at this time.   As per admission H&P: Barbara D Lindsayis an 43 y.o.female.who presents to [facility] as a walk-in, voluntarily, although transported by ambulance. When asked how did she arrive to the hospital she replied, " Kanye West." Out of the blue she yells, " Slim shady to the rescue." This is a patient of Dr. Jake Samples (ACT team). She has had multiple admissions to [facility] with most recent per chart review dated 07/2017.   Principal Problem: Schizoaffective disorder, bipolar type ([employer]) Diagnosis: Principal Problem:   Schizoaffective disorder, bipolar type ([place]) Active Problems:   Cannabis use disorder, severe, dependence ([place])   Schizoaffective disorder ([place])  Total Time spent with patient: 15 minutes  Past Psychiatric History: See admission H&P  Past Medical History:  Past Medical History:  Diagnosis Date  . Anxiety   . Bipolar affective ([place])   . OCD (obsessive compulsive disorder)    No past surgical history on file. Family History:  Family History  Problem Relation Age of Onset  . Drug abuse Father   . Mental illness Other    Family Psychiatric  History: See admission H&P Social History:  Social  History   Substance and Sexual Activity  Alcohol Use Yes   Comment: socially     Social History   Substance and Sexual Activity  Drug Use Yes  . Types: Marijuana   Comment: occasionally    Social History   Socioeconomic History  . Marital status: Single    Spouse name: Not on file  . Number of children: Not on file  . Years of education: Not on file  . Highest education level: Not on file  Occupational History  . Occupation: Disability  Social Needs  . Financial resource strain: Not on file  . Food insecurity    Worry: Not on file    Inability: Not on file  . Transportation needs    Medical: Not on file    Non-medical: Not on file  Tobacco Use  . Smoking status: Current Every Day Smoker    Packs/day: 1.00    Years: 1.00    Pack years: 1.00    Types: Cigarettes  . Smokeless tobacco: Never Used  Substance and Sexual Activity  . Alcohol use: Yes    Comment: socially  . Drug use: Yes    Types: Marijuana    Comment: occasionally  . Sexual activity: Never    Birth control/protection: None  Lifestyle  . Physical activity    Days per week: Not on file    Minutes per session: Not on file  . Stress: Not on file  Relationships  . Social Herbalist on phone:  Not on file    Gets together: Not on file    Attends religious service: Not on file    Active member of club or organization: Not on file    Attends meetings of clubs or organizations: Not on file    Relationship status: Not on file  Other Topics Concern  . Not on file  Social History Narrative   Pt stated that she lives with her husband and two children.  Not verified.  Pt is followed by Dr. [name].   Additional Social History:    Pain Medications: See MAR Prescriptions: See MAR Over the Counter: See MAR History of alcohol / drug use?: Yes Name of Substance 1: Marijuana 1 - Amount (size/oz): ''a lot'' 1 - Duration: Ongoing    [place] in an apartment in [place] that her ACT team (Dr.  [name]) set her up with.               Sleep: Fair  Appetite:  Fair  Current Medications: Current Facility-Administered Medications  Medication Dose Route Frequency Provider Last Rate Last Dose  . acetaminophen (TYLENOL) tablet 650 mg  650 mg Oral Q6H PRN Laveda AbbeParks, Laurie Britton, NP   650 mg at 09/29/18 0359  . alum & mag hydroxide-simeth (MAALOX/MYLANTA) 200-200-20 MG/5ML suspension 30 mL  30 mL Oral Q4H PRN Laveda AbbeParks, Laurie Britton, NP      . diphenhydrAMINE (BENADRYL) capsule 50 mg  50 mg Oral TID Malvin JohnsFarah, Lennyn Gange, MD   50 mg at 10/01/18 0749  . haloperidol (HALDOL) tablet 10 mg  10 mg Oral TID Malvin JohnsFarah, Azrielle Springsteen, MD   10 mg at 09/28/18 1630  . haloperidol (HALDOL) tablet 5 mg  5 mg Oral Q6H PRN Malvin JohnsFarah, Allex Madia, MD       Or  . haloperidol lactate (HALDOL) injection 10 mg  10 mg Intramuscular Q6H PRN Malvin JohnsFarah, Lessie Funderburke, MD      . lithium carbonate (LITHOBID) CR tablet 300 mg  300 mg Oral Q12H Laveda AbbeParks, Laurie Britton, NP   300 mg at 10/01/18 0749  . LORazepam (ATIVAN) tablet 2 mg  2 mg Oral Q4H PRN Malvin JohnsFarah, Gerrie Castiglia, MD   2 mg at 10/01/18 0750   Or  . LORazepam (ATIVAN) injection 2 mg  2 mg Intramuscular Q4H PRN Malvin JohnsFarah, Keontae Levingston, MD      . magnesium hydroxide (MILK OF MAGNESIA) suspension 30 mL  30 mL Oral Daily PRN Laveda AbbeParks, Laurie Britton, NP      . nicotine (NICODERM CQ - dosed in mg/24 hours) patch 21 mg  21 mg Transdermal Daily Cobos, Rockey SituFernando A, MD   21 mg at 10/01/18 0749  . OLANZapine zydis (ZYPREXA) disintegrating tablet 5 mg  5 mg Oral Q8H PRN Aldean BakerSykes, Janet E, NP      . traZODone (DESYREL) tablet 400 mg  400 mg Oral QHS Malvin JohnsFarah, Simona Rocque, MD   400 mg at 09/30/18 2117    Lab Results:  Results for orders placed or performed during the hospital encounter of 09/26/18 (from the past 48 hour(s))  Lithium level     Status: Abnormal   Collection Time: 10/01/18  6:37 AM  Result Value Ref Range   Lithium Lvl 0.58 (L) 0.60 - 1.20 mmol/L    Comment: Performed at [facility], [address]  [place], [place] [address]    Blood Alcohol level:  Lab Results  Component Value Date   [facility] 09/27/2018   ETH <10 07/05/2017    Metabolic Disorder Labs: Lab Results  Component  Value Date   HGBA1C 6.1 (H) 09/27/2018   MPG 128.37 09/27/2018   MPG 140 09/30/2014   Lab Results  Component Value Date   PROLACTIN 15.1 09/27/2018   PROLACTIN 150.0 (H) 08/22/2014   Lab Results  Component Value Date   CHOL 168 09/27/2018   TRIG 117 09/27/2018   HDL 22 (L) 09/27/2018   CHOLHDL 7.6 09/27/2018   VLDL 23 09/27/2018   LDLCALC 123 (H) 09/27/2018   LDLCALC 80 09/30/2014    Physical Findings: AIMS: Facial and Oral Movements Muscles of Facial Expression: None, normal Lips and Perioral Area: None, normal Jaw: None, normal Tongue: None, normal,Extremity Movements Upper (arms, wrists, hands, fingers): None, normal Lower (legs, knees, ankles, toes): None, normal, Trunk Movements Neck, shoulders, hips: None, normal, Overall Severity Severity of abnormal movements (highest score from questions above): None, normal Incapacitation due to abnormal movements: None, normal Patient's awareness of abnormal movements (rate only patient's report): No Awareness, Dental Status Current problems with teeth and/or dentures?: No Does patient usually wear dentures?: No  CIWA:  CIWA-Ar Total: 0 COWS:     Musculoskeletal: Strength & Muscle Tone: within normal limits Gait & Station: normal Patient leans: N/A  Psychiatric Specialty Exam: Physical Exam  Nursing note and vitals reviewed. Constitutional: She is oriented to person, place, and time. She appears well-developed and well-nourished.  Cardiovascular: Normal rate.  Respiratory: Effort normal.  Neurological: She is alert and oriented to person, place, and time.    Review of Systems  Constitutional: Negative.   Psychiatric/Behavioral: Negative for depression, hallucinations, substance abuse and suicidal ideas. The patient is not nervous/anxious  and does not have insomnia.     Blood pressure (!) 117/97, pulse (!) 104, temperature 98.2 F (36.8 C), temperature source Oral, resp. rate 18, SpO2 100 %.There is no height or weight on file to calculate BMI.  General Appearance: Disheveled  Eye Contact:  Minimal  Speech:  Pressured  Volume:  Increased  Mood:  Irritable  Affect:  Congruent  Thought Process:  Disorganized  Orientation:  Full (Time, Place, and Person)  Thought Content:  Tangential  Suicidal Thoughts:  No  Homicidal Thoughts:  No  Memory:  Immediate;   Fair Recent;   Fair  Judgement:  Impaired  Insight:  Lacking  Psychomotor Activity:  Normal  Concentration:  Concentration: Poor  Recall:  FiservFair  Fund of Knowledge:  Fair  Language:  Fair  Akathisia:  No  Handed:  Right  AIMS (if indicated):     Assets:  ArchitectCommunication Skills Financial Resources/Insurance Resilience  ADL's:  Intact  Cognition:  WNL  Sleep:  Number of Hours: 6.75     Treatment Plan Summary: Daily contact with patient to assess and evaluate symptoms and progress in treatment and Medication management   # Schizoaffective disorder / bipolar type  Ms. Lillia AbedLindsay is agitated and still appears unstable with respect to her mood. She lacks insight as to why she is at the hospital. She refuses haldol and risperdal due to self-reported anaphylaxis, though she does not have history of this. Given history of noncompliance, we should consider long-acting injectable before discharge.   1. Continue inpatient hospitalization. Continue to encourage medication adherence. Check lithium level. 2. Continue Haldol 10 mg PO TID for psychosis 3. Continue Benadryl 50 mg PO TID for EPS 4. Continue lithium 300 mg PO BID for mood stability 5. Continue trazodone 400 mg PO QHS for insomnia 6.  Discontinue Ativan 2 mg PO Q4HR PRN agitation 7. Continue Haldol 10  mg IM Q6HR PRN agitation 8. Discuss long-acting injectable before discharge./We will order Invega as she has  responded to that before, patient cannot be discharged without long-acting injectable  Patient will participate in the therapeutic group milieu.  Discharge disposition in progress.   Wayland SalinasMichael J Hendrickson, Medical Student 10/01/2018, 10:10 AM

## 2018-10-01 NOTE — Progress Notes (Signed)
[place] NOVEL CORONAVIRUS (COVID-19) DAILY CHECK-OFF SYMPTOMS - answer yes or no to each - every day NO YES  Have you had a fever in the past 24 hours?  . Fever (Temp > 37.80C / 100F) X   Have you had any of these symptoms in the past 24 hours? . New Cough .  Sore Throat  .  Shortness of Breath .  Difficulty Breathing .  Unexplained Body Aches   X   Have you had any one of these symptoms in the past 24 hours not related to allergies?   . Runny Nose .  Nasal Congestion .  Sneezing   X   If you have had runny nose, nasal congestion, sneezing in the past 24 hours, has it worsened?  X   EXPOSURES - check yes or no X   Have you traveled outside the state in the past 14 days?  X   Have you been in contact with someone with a confirmed diagnosis of COVID-19 or PUI in the past 14 days without wearing appropriate PPE?  X   Have you been living in the same home as a person with confirmed diagnosis of COVID-19 or a PUI (household contact)?    X   Have you been diagnosed with COVID-19?    X              What to do next: Answered NO to all: Answered YES to anything:   Proceed with unit schedule Follow the [facility] Inpatient Flowsheet.   

## 2018-10-01 NOTE — Progress Notes (Signed)
[name], female   DOB: 10-Dec-1975, 43 y.o.   MRN: [id number]  Nursing Progress Note 7a-7p   Patient remains with tangential, pressure speech and manic behaviors. Patient refusing scheduled Haldol stating "I am allergic, my throat closes up". Patient provided PRN Ativan this morning. Patient is seen interacting with peers on the unit. q15 minute safety checks in place. Patient remains safe on the unit. Will continue to support and monitor with plan of care.  Patient's self-inventory sheet Rated Energy Level  Normal  Rated Sleep  Good  Rated Appetite  Good  Rated Anxiety (0-10)  0  Rated Hopelessness (0-10)  0  Rated Depression (0-10)  0  Daily Goal  "discharge, follow the nurses order"  Any Additional Comments:  N/A

## 2018-10-01 NOTE — Progress Notes (Signed)
Recreation Therapy Notes  Date: 7.27.20 Time: 1000 Location: 500 Hall Dayroom  Group Topic: Wellness  Goal Area(s) Addresses:  Patient will define components of whole wellness. Patient will verbalize benefit of whole wellness.  Behavioral Response: Engaged  Intervention: Exercise  Activity: Wellness.  LRT led group in a series of stretches.  Each patient led the group in exercises of their choosing.  Group was to complete at least 30 minutes of exercise.  Patients were allowed to take water breaks and rest breaks as needed.  Education: Wellness, Dentist.   Education Outcome: Acknowledges education/In group clarification offered/Needs additional education.   Clinical Observations/Feedback: Pt was engaged and active during group.  Pt stated a few times that her feet were hurting.  Pt took breaks as she needed and completed the exercises she could do.   Victorino Sparrow, LRT/CTRS         Yambao, Alyah Boehning A 10/01/2018 11:11 AM

## 2018-10-02 MED ORDER — ARIPIPRAZOLE 10 MG PO TABS
10.0000 mg | ORAL_TABLET | Freq: Every day | ORAL | Status: DC
  Administered 2018-10-02 – 2018-10-04 (×3): 10 mg via ORAL
  Filled 2018-10-02 (×4): qty 1

## 2018-10-02 MED ORDER — LITHIUM CARBONATE ER 450 MG PO TBCR
450.0000 mg | EXTENDED_RELEASE_TABLET | Freq: Two times a day (BID) | ORAL | Status: DC
  Administered 2018-10-02 – 2018-10-04 (×4): 450 mg via ORAL
  Filled 2018-10-02 (×6): qty 1

## 2018-10-02 NOTE — Progress Notes (Signed)
[facility] MD Progress Note  10/02/2018 9:10 AM [name]  MRN:  [id number]  [name] is a 43 year old female with a history of schizoaffective disorder/bipolar type on hospital day six presenting voluntarily for disorganized speech/behavior.   Subjective:  She says she slept very well last night. She took a LAI Abilify yesterday and reports that she feels "great" and likes her new medication regimen. She expresses willingness to be compliant with long-acting injectable Abilify in the future. She reiterates that risperdal and haldol causes her anaphylaxis. She is no longer angry at Dr. Jake Samples and "appreciates" her ACT team for caring. She continues to lack insight as to why she is here but feels it was good to get her "back on track". She still feels that celebrities know her personally but recognizes that it is "probably not real". She denies a/v hallucinations, SI, or HI at this time.   As per admission H&P: Barbara D Lindsayis an 43 y.o.female.who presents to [facility] as a walk-in, voluntarily, although transported by ambulance. When asked how did she arrive to the hospital she replied, " Kanye West." Out of the blue she yells, " Slim shady to the rescue." This is a patient of Dr. Jake Samples (ACT team). She has had multiple admissions to [facility] with most recent per chart review dated 07/2017.   Principal Problem: Schizoaffective disorder, bipolar type ([place]) Diagnosis: Principal Problem:   Schizoaffective disorder, bipolar type ([place]) Active Problems:   Cannabis use disorder, severe, dependence ([place])   Schizoaffective disorder ([place])  Total Time spent with patient: 15 minutes  Past Psychiatric History: See admission H&P  Past Medical History:  Past Medical History:  Diagnosis Date  . Anxiety   . Bipolar affective ([place])   . OCD (obsessive compulsive disorder)    No past surgical history on file. Family History:  Family History  Problem Relation Age of Onset  . Drug abuse Father   . Mental  illness Other    Family Psychiatric  History: See admission H&P Social History:  Social History   Substance and Sexual Activity  Alcohol Use Yes   Comment: socially     Social History   Substance and Sexual Activity  Drug Use Yes  . Types: Marijuana   Comment: occasionally    Social History   Socioeconomic History  . Marital status: Single    Spouse name: Not on file  . Number of children: Not on file  . Years of education: Not on file  . Highest education level: Not on file  Occupational History  . Occupation: Disability  Social Needs  . Financial resource strain: Not on file  . Food insecurity    Worry: Not on file    Inability: Not on file  . Transportation needs    Medical: Not on file    Non-medical: Not on file  Tobacco Use  . Smoking status: Current Every Day Smoker    Packs/day: 1.00    Years: 1.00    Pack years: 1.00    Types: Cigarettes  . Smokeless tobacco: Never Used  Substance and Sexual Activity  . Alcohol use: Yes    Comment: socially  . Drug use: Yes    Types: Marijuana    Comment: occasionally  . Sexual activity: Never    Birth control/protection: None  Lifestyle  . Physical activity    Days per week: Not on file    Minutes per session: Not on file  . Stress: Not on file  Relationships  . Social Musicianconnections    Talks on phone: Not on file    Gets together: Not on file    Attends religious service: Not on file    Active member of club or organization: Not on file    Attends meetings of clubs or organizations: Not on file    Relationship status: Not on file  Other Topics Concern  . Not on file  Social History Narrative   Pt stated that she lives with her husband and two children.  Not verified.  Pt is followed by Dr. [name].   Additional Social History:    Pain Medications: See MAR Prescriptions: See MAR Over the Counter: See MAR History of alcohol / drug use?: Yes Name of Substance 1: Marijuana 1 - Amount (size/oz): ''a lot'' 1  - Duration: Ongoing    She lives in an apartment in [place] that her ACT team (Dr. [name]) set her up with.               Sleep: Fair  Appetite:  Fair  Current Medications: Current Facility-Administered Medications  Medication Dose Route Frequency Provider Last Rate Last Dose  . acetaminophen (TYLENOL) tablet 650 mg  650 mg Oral Q6H PRN Laveda AbbeParks, Laurie Britton, NP   650 mg at 09/29/18 0359  . alum & mag hydroxide-simeth (MAALOX/MYLANTA) 200-200-20 MG/5ML suspension 30 mL  30 mL Oral Q4H PRN Laveda AbbeParks, Laurie Britton, NP      . ARIPiprazole (ABILIFY) tablet 10 mg  10 mg Oral Daily Malvin JohnsFarah, Anothony Bursch, MD      . ARIPiprazole ER (ABILIFY MAINTENA) injection 400 mg  400 mg Intramuscular Q28 days Malvin JohnsFarah, Tim Wilhide, MD   400 mg at 10/01/18 1521  . diphenhydrAMINE (BENADRYL) capsule 50 mg  50 mg Oral TID Malvin JohnsFarah, Sarahann Horrell, MD   50 mg at 10/02/18 0820  . haloperidol (HALDOL) tablet 5 mg  5 mg Oral Q6H PRN Malvin JohnsFarah, Zailyn Thoennes, MD       Or  . haloperidol lactate (HALDOL) injection 10 mg  10 mg Intramuscular Q6H PRN Malvin JohnsFarah, Mehul Rudin, MD      . lithium carbonate (ESKALITH) CR tablet 450 mg  450 mg Oral Q12H Malvin JohnsFarah, Yvett Rossel, MD      . magnesium hydroxide (MILK OF MAGNESIA) suspension 30 mL  30 mL Oral Daily PRN Laveda AbbeParks, Laurie Britton, NP      . nicotine (NICODERM CQ - dosed in mg/24 hours) patch 21 mg  21 mg Transdermal Daily Cobos, Rockey SituFernando A, MD   21 mg at 10/02/18 0820  . OLANZapine zydis (ZYPREXA) disintegrating tablet 5 mg  5 mg Oral Q8H PRN Aldean BakerSykes, Janet E, NP      . traZODone (DESYREL) tablet 400 mg  400 mg Oral QHS Malvin JohnsFarah, Paloma Grange, MD   400 mg at 10/01/18 2127    Lab Results:  Results for orders placed or performed during the hospital encounter of 09/26/18 (from the past 48 hour(s))  Lithium level     Status: Abnormal   Collection Time: 10/01/18  6:37 AM  Result Value Ref Range   Lithium Lvl 0.58 (L) 0.60 - 1.20 mmol/L    Comment: Performed at [facility], [address] [place], [place] [id number]     Blood Alcohol level:  Lab Results  Component Value Date   [facility] 09/27/2018   ETH <10 07/05/2017    Metabolic Disorder Labs: Lab Results  Component Value Date   HGBA1C 6.1 (H) 09/27/2018   MPG 128.37 09/27/2018   MPG 140  09/30/2014   Lab Results  Component Value Date   PROLACTIN 15.1 09/27/2018   PROLACTIN 150.0 (H) 08/22/2014   Lab Results  Component Value Date   CHOL 168 09/27/2018   TRIG 117 09/27/2018   HDL 22 (L) 09/27/2018   CHOLHDL 7.6 09/27/2018   VLDL 23 09/27/2018   LDLCALC 123 (H) 09/27/2018   LDLCALC 80 09/30/2014    Physical Findings: AIMS: Facial and Oral Movements Muscles of Facial Expression: None, normal Lips and Perioral Area: None, normal Jaw: None, normal Tongue: None, normal,Extremity Movements Upper (arms, wrists, hands, fingers): None, normal Lower (legs, knees, ankles, toes): None, normal, Trunk Movements Neck, shoulders, hips: None, normal, Overall Severity Severity of abnormal movements (highest score from questions above): None, normal Incapacitation due to abnormal movements: None, normal Patient's awareness of abnormal movements (rate only patient's report): No Awareness, Dental Status Current problems with teeth and/or dentures?: No Does patient usually wear dentures?: No  CIWA:  CIWA-Ar Total: 0 COWS:     Musculoskeletal: Strength & Muscle Tone: within normal limits Gait & Station: normal Patient leans: N/A  Psychiatric Specialty Exam: Physical Exam  Nursing note and vitals reviewed. Constitutional: She is oriented to person, place, and time. She appears well-developed and well-nourished.  Cardiovascular: Normal rate.  Respiratory: Effort normal.  Neurological: She is alert and oriented to person, place, and time.    Review of Systems  Constitutional: Negative.   Psychiatric/Behavioral: Negative for depression, hallucinations, substance abuse and suicidal ideas. The patient is not nervous/anxious and does not have  insomnia.     Blood pressure (!) 117/97, pulse (!) 104, temperature 98.2 F (36.8 C), temperature source Oral, resp. rate 18, SpO2 100 %.There is no height or weight on file to calculate BMI.  General Appearance: Disheveled  Eye Contact:  Good  Speech:  Clear and Coherent and Normal Rate  Volume:  Normal  Mood:  Euthymic  Affect:  Congruent  Thought Process:  Coherent, Goal Directed and Linear  Orientation:  Full (Time, Place, and Person)  Thought Content:  Logical, delusions  Suicidal Thoughts:  No  Homicidal Thoughts:  No  Memory:  Immediate;   Fair Recent;   Fair  Judgement:  Impaired  Insight:  Lacking  Psychomotor Activity:  Normal  Concentration:  Concentration: Poor  Recall:  FiservFair  Fund of Knowledge:  Fair  Language:  Fair  Akathisia:  No  Handed:  Right  AIMS (if indicated):     Assets:  ArchitectCommunication Skills Financial Resources/Insurance Resilience  ADL's:  Intact  Cognition:  WNL  Sleep:  Number of Hours: 6.75     Treatment Plan Summary: Daily contact with patient to assess and evaluate symptoms and progress in treatment and Medication management   # Schizoaffective disorder / bipolar type  [name] appears much better today. She lacks insight as to why she is at the hospital, but she took the LAI Abilify and iterates that she would like to continue. She is not quite at baseline but much improved after the Abilify.   1. Continue inpatient hospitalization. Continue to encourage medication adherence. Check lithium level. 2. Discontinue Haldol 10 mg PO TID for psychosis 3. Continue Benadryl 50 mg PO TID for EPS 4. Continue lithium 300 mg PO BID for mood stability 5. Continue trazodone 400 mg PO QHS for insomnia 6. Discuss long-acting injectable before discharge./We will order Invega as she has responded to that before, patient cannot be discharged without long-acting injectable  Patient will participate in the therapeutic  group milieu.  Discharge  disposition in progress.   Wayland SalinasMichael J Hendrickson, Medical Student 10/02/2018, 9:10 AM

## 2018-10-02 NOTE — Progress Notes (Signed)
Recreation Therapy Notes  Date: 7.28.20 Time: 1000 Location: 500 Hall Dayroom  Group Topic: Triggers  Goal Area(s) Addresses:  Patient will identify three biggest triggers.   Patient will identify how to avoid triggers. Patient will identify how to face triggers head on.  Intervention: Worksheet  Activity: Triggers.  Patients were to identify what triggers certain behaviors in them.  Patients would then identify how they can avoid dealing with their triggers.  Patient would also explain how they could face triggers head on when they can't be avoided.  Education: Triggers, Discharge Planning   Education Outcome: Acknowledges education/In group clarification offered/Needs additional education.   Clinical Observations/Feedback: Pt did not attend group.     Victorino Sparrow, LRT/CTRS   [place], Heather Streeper A 10/02/2018 11:08 AM

## 2018-10-02 NOTE — Progress Notes (Signed)
Patient did not attend wrap up group. 

## 2018-10-02 NOTE — Progress Notes (Signed)
[name], female   DOB: 06/09/1975, 43 y.o.   MRN: [id number]  Nursing Progress Note [phone]  Patient presents with flat affect but is calm and cooperative on the unit. Patient is less manic and disruptive today. Patient compliant with scheduled medications and is agreeable to taking Abilify. Patient currently denies SI/HI/AVH. Patient expressing interest in discharge.  Patient safety maintained with q15 min safety checks.   Patient remains safe on the unit at this time. Will continue to support and monitor with plan of care.   Patient's self-inventory sheet Rated Energy Level  Normal  Rated Sleep  Good  Rated Appetite  Good  Rated Anxiety (0-10)  0  Rated Hopelessness (0-10)  0  Rated Depression (0-10)  1  Daily Goal  "take meds, plan with doctor"  Any Additional Comments:  N/A

## 2018-10-02 NOTE — Progress Notes (Signed)
[name], female   DOB: Jan 11, 1976, 43 y.o.   MRN: [id number]   CSW was contacted by patient's ACTT team, Park Pope. ACTT team wanted updates and to see about possible discharge date.

## 2018-10-03 NOTE — Progress Notes (Signed)
[facility] MD Progress Note  10/03/2018 9:24 AM [name]  MRN:  [id number]  [name] is a 43 year old female with a history of schizoaffective disorder/bipolar type on hospital day seven presenting voluntarily for disorganized speech/behavior.   Subjective:  [name] is seen in her room and says she slept well. She feels "great" and continues to tolerate her medications well. At this time, she is concerned that she needs to get home to pay her bills at the end of the month, but otherwise has no concerns. She says that she recognizes that she had not been doing well before this hospitalization and says she was having delusions that she wrote the songs she hears on the radio and that she was Delos Haring (also known as rapper, [name]). She acknowledges that she "was not right" when she felt those things and understands that medication compliance is a large contributor to the recurrence of those delusions. She is agreeable to long-term treatment with LAI Abilify. She denies a/v hallucinations, SI or HI at this time.   As per admission H&P: Barbara D Lindsayis an 43 y.o.female.who presents to [facility] as a walk-in, voluntarily, although transported by ambulance. When asked how did she arrive to the hospital she replied, " Kanye West." Out of the blue she yells, " Slim shady to the rescue." This is a patient of Dr. Jake Samples (ACT team). She has had multiple admissions to [facility] with most recent per chart review dated 07/2017.   Principal Problem: Schizoaffective disorder, bipolar type ([place]) Diagnosis: Principal Problem:   Schizoaffective disorder, bipolar type ([place]) Active Problems:   Cannabis use disorder, severe, dependence ([place])   Schizoaffective disorder ([place])  Total Time spent with patient: 15 minutes  Past Psychiatric History: See admission H&P  Past Medical History:  Past Medical History:  Diagnosis Date  . Anxiety   . Bipolar affective ([place])   . OCD (obsessive compulsive disorder)    No  past surgical history on file. Family History:  Family History  Problem Relation Age of Onset  . Drug abuse Father   . Mental illness Other    Family Psychiatric  History: See admission H&P Social History:  Social History   Substance and Sexual Activity  Alcohol Use Yes   Comment: socially     Social History   Substance and Sexual Activity  Drug Use Yes  . Types: Marijuana   Comment: occasionally    Social History   Socioeconomic History  . Marital status: Single    Spouse name: Not on file  . Number of children: Not on file  . Years of education: Not on file  . Highest education level: Not on file  Occupational History  . Occupation: Disability  Social Needs  . Financial resource strain: Not on file  . Food insecurity    Worry: Not on file    Inability: Not on file  . Transportation needs    Medical: Not on file    Non-medical: Not on file  Tobacco Use  . Smoking status: Current Every Day Smoker    Packs/day: 1.00    Years: 1.00    Pack years: 1.00    Types: Cigarettes  . Smokeless tobacco: Never Used  Substance and Sexual Activity  . Alcohol use: Yes    Comment: socially  . Drug use: Yes    Types: Marijuana    Comment: occasionally  . Sexual activity: Never    Birth control/protection: None  Lifestyle  . Physical activity  Days per week: Not on file    Minutes per session: Not on file  . Stress: Not on file  Relationships  . Social Musicianconnections    Talks on phone: Not on file    Gets together: Not on file    Attends religious service: Not on file    Active member of club or organization: Not on file    Attends meetings of clubs or organizations: Not on file    Relationship status: Not on file  Other Topics Concern  . Not on file  Social History Narrative   Pt stated that she lives with her husband and two children.  Not verified.  Pt is followed by Dr. [name].   Additional Social History:    Pain Medications: See MAR Prescriptions: See  MAR Over the Counter: See MAR History of alcohol / drug use?: Yes Name of Substance 1: Marijuana 1 - Amount (size/oz): ''a lot'' 1 - Duration: Ongoing    She lives in an apartment in [place] that her ACT team (Dr. [name]) set her up with.               Sleep: Fair  Appetite:  Fair  Current Medications: Current Facility-Administered Medications  Medication Dose Route Frequency Provider Last Rate Last Dose  . acetaminophen (TYLENOL) tablet 650 mg  650 mg Oral Q6H PRN Laveda AbbeParks, Laurie Britton, NP   650 mg at 09/29/18 0359  . alum & mag hydroxide-simeth (MAALOX/MYLANTA) 200-200-20 MG/5ML suspension 30 mL  30 mL Oral Q4H PRN Laveda AbbeParks, Laurie Britton, NP      . ARIPiprazole (ABILIFY) tablet 10 mg  10 mg Oral Daily Malvin JohnsFarah, Destenie Ingber, MD   10 mg at 10/03/18 0856  . ARIPiprazole ER (ABILIFY MAINTENA) injection 400 mg  400 mg Intramuscular Q28 days Malvin JohnsFarah, Kailand Seda, MD   400 mg at 10/01/18 1521  . diphenhydrAMINE (BENADRYL) capsule 50 mg  50 mg Oral TID Malvin JohnsFarah, Sherrilynn Gudgel, MD   50 mg at 10/03/18 0856  . haloperidol (HALDOL) tablet 5 mg  5 mg Oral Q6H PRN Malvin JohnsFarah, Keilani Terrance, MD       Or  . haloperidol lactate (HALDOL) injection 10 mg  10 mg Intramuscular Q6H PRN Malvin JohnsFarah, Betul Brisky, MD      . lithium carbonate (ESKALITH) CR tablet 450 mg  450 mg Oral Q12H Malvin JohnsFarah, Olga Seyler, MD   450 mg at 10/03/18 0856  . magnesium hydroxide (MILK OF MAGNESIA) suspension 30 mL  30 mL Oral Daily PRN Laveda AbbeParks, Laurie Britton, NP      . nicotine (NICODERM CQ - dosed in mg/24 hours) patch 21 mg  21 mg Transdermal Daily Cobos, Rockey SituFernando A, MD   21 mg at 10/03/18 0856  . OLANZapine zydis (ZYPREXA) disintegrating tablet 5 mg  5 mg Oral Q8H PRN Aldean BakerSykes, Janet E, NP      . traZODone (DESYREL) tablet 400 mg  400 mg Oral QHS Malvin JohnsFarah, Starlena Beil, MD   400 mg at 10/02/18 2104    Lab Results:  No results found for this or any previous visit (from the past 48 hour(s)).  Blood Alcohol level:  Lab Results  Component Value Date   [facility] 09/27/2018   ETH <10  07/05/2017    Metabolic Disorder Labs: Lab Results  Component Value Date   HGBA1C 6.1 (H) 09/27/2018   MPG 128.37 09/27/2018   MPG 140 09/30/2014   Lab Results  Component Value Date   PROLACTIN 15.1 09/27/2018   PROLACTIN 150.0 (H) 08/22/2014   Lab Results  Component  Value Date   CHOL 168 09/27/2018   TRIG 117 09/27/2018   HDL 22 (L) 09/27/2018   CHOLHDL 7.6 09/27/2018   VLDL 23 09/27/2018   LDLCALC 123 (H) 09/27/2018   LDLCALC 80 09/30/2014    Physical Findings: AIMS: Facial and Oral Movements Muscles of Facial Expression: None, normal Lips and Perioral Area: None, normal Jaw: None, normal Tongue: None, normal,Extremity Movements Upper (arms, wrists, hands, fingers): None, normal Lower (legs, knees, ankles, toes): None, normal, Trunk Movements Neck, shoulders, hips: None, normal, Overall Severity Severity of abnormal movements (highest score from questions above): None, normal Incapacitation due to abnormal movements: None, normal Patient's awareness of abnormal movements (rate only patient's report): No Awareness, Dental Status Current problems with teeth and/or dentures?: No Does patient usually wear dentures?: No  CIWA:  CIWA-Ar Total: 0 COWS:     Musculoskeletal: Strength & Muscle Tone: within normal limits Gait & Station: normal Patient leans: N/A  Psychiatric Specialty Exam: Physical Exam  Nursing note and vitals reviewed. Constitutional: She is oriented to person, place, and time. She appears well-developed and well-nourished.  Cardiovascular: Normal rate.  Respiratory: Effort normal.  Neurological: She is alert and oriented to person, place, and time.    Review of Systems  Constitutional: Negative.   Psychiatric/Behavioral: Negative for depression, hallucinations, substance abuse and suicidal ideas. The patient is not nervous/anxious and does not have insomnia.     Blood pressure 103/60, pulse 80, temperature 98.3 F (36.8 C), temperature source  Oral, resp. rate 18, SpO2 100 %.There is no height or weight on file to calculate BMI.  General Appearance: Disheveled  Eye Contact:  Good  Speech:  Clear and Coherent and Normal Rate  Volume:  Normal  Mood:  Euthymic  Affect:  Congruent  Thought Process:  Coherent, Goal Directed and Linear  Orientation:  Full (Time, Place, and Person)  Thought Content:  Logical, delusions  Suicidal Thoughts:  No  Homicidal Thoughts:  No  Memory:  Immediate;   Fair Recent;   Fair  Judgement:  Impaired  Insight:  Lacking  initially   Psychomotor Activity:  Normal  Concentration:  Concentration: Poor  Recall:  FiservFair  Fund of Knowledge:  Fair  Language:  Fair  Akathisia:  No  Handed:  Right  AIMS (if indicated):     Assets:  ArchitectCommunication Skills Financial Resources/Insurance Resilience  ADL's:  Intact  Cognition:  WNL  Sleep:  Number of Hours: 6.75     Treatment Plan Summary: Daily contact with patient to assess and evaluate symptoms and progress in treatment and Medication management   # Schizoaffective disorder / bipolar type  Ms. [name] appears well today, very close to baseline. She demonstrates good insight into her illness and is agreeable to long term therapy with LAI abilify.   1. Continue inpatient hospitalization. Continue to encourage medication adherence. Check lithium level. 2. Continue Benadryl 50 mg PO TID for EPS 3. Continue lithium 300 mg PO BID for mood stability 4. Continue trazodone 400 mg PO QHS for insomnia 5. Continue Abilify Maintena po for 14 days after LAI to bridge Abilify LAI  6. Prepare for discharge likely tomorrow   Patient will participate in the therapeutic group milieu.  Discharge disposition in progress.   Wayland SalinasMichael J Hendrickson, Medical Student 10/03/2018, 9:24 AM

## 2018-10-03 NOTE — Tx Team (Signed)
Interdisciplinary Treatment and Diagnostic Plan Update  10/03/2018 Time of Session: 09:12am [name] MRN: [id number]  Principal Diagnosis: Schizoaffective disorder, bipolar type ([place])  Secondary Diagnoses: Principal Problem:   Schizoaffective disorder, bipolar type ([place]) Active Problems:   Cannabis use disorder, severe, dependence ([place])   Schizoaffective disorder ([place])   Current Medications:  Current Facility-Administered Medications  Medication Dose Route Frequency Provider Last Rate Last Dose  . acetaminophen (TYLENOL) tablet 650 mg  650 mg Oral Q6H PRN Ethelene Hal, NP   650 mg at 09/29/18 0359  . alum & mag hydroxide-simeth (MAALOX/MYLANTA) 200-200-20 MG/5ML suspension 30 mL  30 mL Oral Q4H PRN Ethelene Hal, NP      . ARIPiprazole (ABILIFY) tablet 10 mg  10 mg Oral Daily Johnn Hai, MD   10 mg at 10/03/18 0856  . ARIPiprazole ER (ABILIFY MAINTENA) injection 400 mg  400 mg Intramuscular Q28 days Johnn Hai, MD   400 mg at 10/01/18 1521  . diphenhydrAMINE (BENADRYL) capsule 50 mg  50 mg Oral TID Johnn Hai, MD   50 mg at 10/03/18 0856  . haloperidol (HALDOL) tablet 5 mg  5 mg Oral Q6H PRN Johnn Hai, MD       Or  . haloperidol lactate (HALDOL) injection 10 mg  10 mg Intramuscular Q6H PRN Johnn Hai, MD      . lithium carbonate (ESKALITH) CR tablet 450 mg  450 mg Oral Q12H Johnn Hai, MD   450 mg at 10/03/18 0856  . magnesium hydroxide (MILK OF MAGNESIA) suspension 30 mL  30 mL Oral Daily PRN Ethelene Hal, NP      . nicotine (NICODERM CQ - dosed in mg/24 hours) patch 21 mg  21 mg Transdermal Daily Cobos, Myer Peer, MD   21 mg at 10/03/18 0856  . OLANZapine zydis (ZYPREXA) disintegrating tablet 5 mg  5 mg Oral Q8H PRN Connye Burkitt, NP      . traZODone (DESYREL) tablet 400 mg  400 mg Oral QHS Johnn Hai, MD   400 mg at 10/02/18 2104   PTA Medications: Medications Prior to Admission  Medication Sig Dispense Refill Last Dose  .  asenapine (SAPHRIS) 5 MG SUBL 24 hr tablet Place 2 tablets (10 mg total) under the tongue 2 (two) times daily. For mood copntrol 60 tablet 0   . benztropine (COGENTIN) 1 MG tablet Take 1 tablet (1 mg total) by mouth 2 (two) times daily as needed (EPS). (Patient taking differently: Take 1 mg by mouth 2 (two) times daily. ) 60 tablet 0   . fluPHENAZine (PROLIXIN) 10 MG tablet Take 10 mg by mouth daily.     . hydrOXYzine (ATARAX/VISTARIL) 50 MG tablet Take 1 tablet (50 mg total) by mouth every 6 (six) hours as needed for anxiety. 60 tablet 0   . lithium carbonate 300 MG capsule Take 600 mg by mouth 2 (two) times daily with a meal.      . LORazepam (ATIVAN) 1 MG tablet Take 1 tablet (1 mg total) by mouth every 6 (six) hours as needed (agitation). (Patient taking differently: Take 1 mg by mouth 2 (two) times daily. ) 16 tablet 0   . traZODone (DESYREL) 100 MG tablet Take 1 tablet (100 mg total) by mouth at bedtime as needed for sleep. For sleep 30 tablet 0   . fluPHENAZine decanoate (PROLIXIN) 25 MG/ML injection Inject 0.5 mLs (12.5 mg total) into the muscle every 14 (fourteen) days. (Due on 07-25-17): For mood control (Patient  not taking: Reported on 09/27/2018) 5 mL 0 Not Taking at Unknown time  . nicotine (NICODERM CQ - DOSED IN MG/24 HOURS) 21 mg/24hr patch Place 1 patch (21 mg total) onto the skin daily. (May buy from over the counter): For smoking cessation (Patient not taking: Reported on 09/27/2018) 28 patch 0 Not Taking at Unknown time    Patient Stressors: Health problems Medication change or noncompliance  Patient Strengths: Average or above average intelligence Capable of independent living Supportive family/friends  Treatment Modalities: Medication Management, Group therapy, Case management,  1 to 1 session with clinician, Psychoeducation, Recreational therapy.   Physician Treatment Plan for Primary Diagnosis: Schizoaffective disorder, [name] Long Term Goal(s):     Short  Term Goals:    Medication Management: Evaluate patient's response, side effects, and tolerance of medication regimen.  Therapeutic Interventions: 1 to 1 sessions, Unit Group sessions and Medication administration.  Evaluation of Outcomes: Progressing  Physician Treatment Plan for Secondary Diagnosis: Principal Problem:   Schizoaffective disorder, [name] Active Problems:   Cannabis use disorder, severe, dependence ([facility])   Schizoaffective disorder ([facility])  Long Term Goal(s):     Short Term Goals:       Medication Management: Evaluate patient's response, side effects, and tolerance of medication regimen.  Therapeutic Interventions: 1 to 1 sessions, Unit Group sessions and Medication administration.  Evaluation of Outcomes: Progressing   RN Treatment Plan for Primary Diagnosis: Schizoaffective disorder, [name] Long Term Goal(s): Knowledge of disease and therapeutic regimen to maintain health will improve  Short Term Goals: Ability to participate in decision making will improve, Ability to verbalize feelings will improve, Ability to disclose and discuss suicidal ideas, Ability to identify and develop effective coping behaviors will improve and Compliance with prescribed medications will improve  Medication Management: RN will administer medications as ordered by provider, will assess and evaluate patient's response and provide education to patient for prescribed medication. RN will report any adverse and/or side effects to prescribing provider.  Therapeutic Interventions: 1 on 1 counseling sessions, Psychoeducation, Medication administration, Evaluate responses to treatment, Monitor vital signs and CBGs as ordered, Perform/monitor CIWA, COWS, AIMS and Fall Risk screenings as ordered, Perform wound care treatments as ordered.  Evaluation of Outcomes: Progressing   LCSW Treatment Plan for Primary Diagnosis: Schizoaffective disorder, [name] Long Term  Goal(s): Safe transition to appropriate next level of care at discharge, Engage patient in therapeutic group addressing interpersonal concerns.  Short Term Goals: Engage patient in aftercare planning with referrals and resources and Increase skills for wellness and recovery  Therapeutic Interventions: Assess for all discharge needs, 1 to 1 time with Social worker, Explore available resources and support systems, Assess for adequacy in community support network, Educate family and significant other(s) on suicide prevention, Complete Psychosocial Assessment, Interpersonal group therapy.  Evaluation of Outcomes: Progressing   Progress in Treatment: Attending groups: No. Participating in groups: No. Taking medication as prescribed: Yes. Toleration medication: Yes. Family/Significant other contact made: Yes, individual(s) contacted:  with patient Patient understands diagnosis: No. Discussing patient identified problems/goals with staff: Yes. Medical problems stabilized or resolved: Yes. Denies suicidal/homicidal ideation: Yes. Issues/concerns per patient self-inventory: No. Other:   New problem(s) identified: No, Describe:  None  New Short Term/Long Term Goal(s): Medication stabilization, elimination of SI thoughts, and development of a comprehensive mental wellness plan.   Patient Goals:    Discharge Plan or Barriers: CSW will continue to follow up for appropriate referrals and possible discharge planning  Reason  for Continuation of Hospitalization: Delusions  [place] Medication stabilization  Estimated Length of Stay: 1-2 days  Attendees: Patient: 10/03/2018   Physician: Dr. Malvin JohnsBrian Farah, MD 10/03/2018   Nursing: Cletis AthensJon, RN 10/03/2018   RN Care Manager: 10/03/2018  Social Worker: Stephannie PetersJasmine Kinnie Kaupp, LCSW 10/03/2018   Recreational Therapist:  10/03/2018   Other:  10/03/2018   Other:  10/03/2018   Other: 10/03/2018      Scribe for Treatment Team: Delphia GratesJasmine M Luretta Everly, LCSW 10/03/2018 10:52 AM

## 2018-10-03 NOTE — Progress Notes (Signed)
[place] NOVEL CORONAVIRUS (COVID-19) DAILY CHECK-OFF SYMPTOMS - answer yes or no to each - every day NO YES  Have you had a fever in the past 24 hours?  . Fever (Temp > 37.80C / 100F) X   Have you had any of these symptoms in the past 24 hours? . New Cough .  Sore Throat  .  Shortness of Breath .  Difficulty Breathing .  Unexplained Body Aches   X   Have you had any one of these symptoms in the past 24 hours not related to allergies?   . Runny Nose .  Nasal Congestion .  Sneezing   X   If you have had runny nose, nasal congestion, sneezing in the past 24 hours, has it worsened?  X   EXPOSURES - check yes or no X   Have you traveled outside the state in the past 14 days?  X   Have you been in contact with someone with a confirmed diagnosis of COVID-19 or PUI in the past 14 days without wearing appropriate PPE?  X   Have you been living in the same home as a person with confirmed diagnosis of COVID-19 or a PUI (household contact)?    X   Have you been diagnosed with COVID-19?    X              What to do next: Answered NO to all: Answered YES to anything:   Proceed with unit schedule Follow the [facility] Inpatient Flowsheet.   

## 2018-10-03 NOTE — Progress Notes (Signed)
Recreation Therapy Notes  Date: 7.29.20 Time: 1000 Location: 500 Hall Day Room  Group Topic: Communication, Team Building, Problem Solving  Goal Area(s) Addresses:  Patient will effectively work with peer towards shared goal.  Patient will identify skill used to make activity successful.  Patient will identify how skills used during activity can be used to reach post d/c goals.   Behavioral Response: Minimal  Intervention: STEM Activity   Activity: Straw Bridge.  In groups, patients were given 15 plastic straws and about 2 feet of masking tape.  Patients were to use the supplies provided to them to construct an elevated bridge that could hold a small puzzle box.  Education: Education officer, community, Dentist.   Education Outcome: Acknowledges education  Clinical Observations/Feedback:  Pt did some work on the bridge but was mainly focused on whether her phone was in her locker.  Pt was pleasant.     Victorino Sparrow, LRT/CTRS    Kreischer, Arish Redner A 10/03/2018 11:06 AM

## 2018-10-03 NOTE — Progress Notes (Signed)
D: Patient presents more cooperative and unit appropriate. She is less demanding, intrusive than this past weekend. Patient denies SI/HI/AVH. On self inventory: Patient slept well last night and received medication that was helpful. Her appetite is good, energy normal and concentration good. She rates her depression 0/10 and hopelessness 2/10. She complains of a runny nose. She denies withdrawal or other physical symptoms.  A: Patient checked q15 min, and checks reviewed. Reviewed medication changes with patient and educated on side effects. Educated patient on importance of attending group therapy sessions and educated on several coping skills. Encouarged participation in milieu through recreation therapy and attending meals with peers. Support and encouragement provided. Fluids offered. R: Patient receptive to education on medications, and is medication compliant. Patient contracts for safety on the unit.

## 2018-10-03 NOTE — Progress Notes (Signed)
Nursing Progress Note: 7p-7a D: Pt currently presents with a depressed/labile/agitated/ affect and behavior. Interacting appropriately with the milieu. Pt reports good sleep during the previous night with current medication regimen.  A: Pt provided with medications per providers orders. Pt's labs and vitals were monitored throughout the night. Pt supported emotionally and encouraged to express concerns and questions. Pt educated on medications.  R: Pt's safety ensured with 15 minute and environmental checks. Pt currently denies SI, HI, and AVH. Pt verbally contracts to seek staff if SI,HI, or AVH occurs and to consult with staff before acting on any harmful thoughts. Will continue to monitor.   [place] NOVEL CORONAVIRUS (COVID-19) DAILY CHECK-OFF SYMPTOMS - answer yes or no to each - every day NO YES  Have you had a fever in the past 24 hours?  . Fever (Temp > 37.80C / 100F) X   Have you had any of these symptoms in the past 24 hours? . New Cough .  Sore Throat  .  Shortness of Breath .  Difficulty Breathing .  Unexplained Body Aches   X   Have you had any one of these symptoms in the past 24 hours not related to allergies?   . Runny Nose .  Nasal Congestion .  Sneezing   X   If you have had runny nose, nasal congestion, sneezing in the past 24 hours, has it worsened?  X   EXPOSURES - check yes or no X   Have you traveled outside the state in the past 14 days?  X   Have you been in contact with someone with a confirmed diagnosis of COVID-19 or PUI in the past 14 days without wearing appropriate PPE?  X   Have you been living in the same home as a person with confirmed diagnosis of COVID-19 or a PUI (household contact)?    X   Have you been diagnosed with COVID-19?    X              What to do next: Answered NO to all: Answered YES to anything:   Proceed with unit schedule Follow the [facility] Inpatient Flowsheet.

## 2018-10-03 NOTE — Progress Notes (Signed)
   10/03/18 0856  COVID-19 Daily Checkoff  Have you had a fever (temp > 37.80C/100F)  in the past 24 hours?  No  If you have had runny nose, nasal congestion, sneezing in the past 24 hours, has it worsened? No  COVID-19 EXPOSURE  Have you traveled outside the state in the past 14 days? No  Have you been in contact with someone with a confirmed diagnosis of COVID-19 or PUI in the past 14 days without wearing appropriate PPE? No  Have you been living in the same home as a person with confirmed diagnosis of COVID-19 or a PUI (household contact)? No  Have you been diagnosed with COVID-19? No

## 2018-10-04 MED ORDER — LITHIUM CARBONATE ER 450 MG PO TBCR: 450.0000 mg | EXTENDED_RELEASE_TABLET | Freq: Two times a day (BID) | ORAL | 11 refills | Status: DC

## 2018-10-04 MED ORDER — ARIPIPRAZOLE ER 400 MG IM SRER: 400.0000 mg | INTRAMUSCULAR | 11 refills | Status: AC

## 2018-10-04 MED ORDER — TRAZODONE HCL 300 MG PO TABS: 300.0000 mg | ORAL_TABLET | Freq: Every day | ORAL | 11 refills | Status: DC

## 2018-10-04 MED ORDER — ARIPIPRAZOLE 20 MG PO TABS: 20.0000 mg | ORAL_TABLET | Freq: Every day | ORAL | 11 refills | Status: DC

## 2018-10-04 MED ORDER — DIPHENHYDRAMINE HCL 50 MG PO CAPS: 50.0000 mg | ORAL_CAPSULE | Freq: Three times a day (TID) | ORAL | 5 refills | Status: DC | PRN

## 2018-10-04 NOTE — Progress Notes (Signed)
  [facility] Adult Case Management Discharge Plan :  Will you be returning to the same living situation after discharge:  Yes,  home At discharge, do you have transportation home?: Yes,  pt's mother Do you have the ability to pay for your medications: Yes,  medicare  Release of information consent forms completed and in the chart;  Patient's signature needed at discharge.  Patient to Follow up at: Follow-up Information    [facility] Follow up.   Why: Your ACTT team will be at your home on Friday, July 31st, 2020 at 12:00pm for your hospital discharge appointment.  Contact information: [address] [place] [address] [phone]           Next level of care provider has access to [place] and Suicide Prevention discussed: Yes,  pt denied; with pt     Has patient been referred to the Quitline?: Patient refused referral  Patient has been referred for addiction treatment: Yes  Trecia Rogers, LCSW 10/04/2018, 9:24 AM

## 2018-10-04 NOTE — Progress Notes (Signed)
Pt discharged to lobby. Pt was stable and appreciative at that time. All papers and prescriptions were given and valuables returned. Verbal understanding expressed. Denies SI/HI and A/VH. Pt given opportunity to express concerns and ask questions.  

## 2018-10-04 NOTE — Discharge Summary (Signed)
Physician Discharge Summary Note  Patient:  [name] is an 43 y.o., female MRN:  [id number] DOB:  [birth year] Patient phone:  [phone] (home)  Patient address:   [address] [place] [address],  Total Time spent with patient: 45 minutes  Date of Admission:  09/26/2018 Date of Discharge: 10/04/2018  Reason for Admission:    Barbara KeelChanda is well-known to the service/healthcare system and she is followed by [facility] act team. She is chronically poorly compliant with antipsychotic medications, and has done well with long-acting injectables but eventually refuses them at some point in time.  She was found on the date of 7/22 by act team personnel in a very disorganized state of mind, actively hallucinating, speaking gibberish and nonsense, and then became more manic when she presented through the assessment area.  She clearly needed inpatient stabilization, Laboratory work revealed a subtherapeutic lithium level at 0.58, a TSH that was normal and a very slightly elevated T4 not clinically significant at only 1.15, and other labs are generally nondiagnostic although she never gave over a urine drug screen.  See admission note for extensive details.  Initial work-up reads as follows History of Present Illness: Barbara D Lindsayis an 43 y.o.female.who [facility] as a walk[facility]in, voluntarily, although transported by ambulance. When asked how did she arrive to the hospital she replied, " [name]." Out of the blue she yells, " [name] to the rescue." This is a patient of [name] (ACT team). She has had [facility] with most recent per chart review dated 07/2017. She has a history ofSchizoaffective disorder, bipolar typeas well as agitation, homicidal ideation and Cannabis use disorder. During this evaluation, her thought process is very disorganized and descriptions of associations are loose.. She endorse both auditory and visual  hallucinations and when asked to described both she replied, " I see crab legs, peanuts and cheese. " She endorses she is hearing voices telling her to harm herself. Some thought blocking is noted.She inappropriately laughs throughout the evaluations. She admits to daily cannabis use although denies any other substance abuse or sue. She denies suicidal or homicidal thoughts. She reports she has been complaint with her current medications although there is history of non-compliance following review of chart. As per [name], patient does meet inpatient criteria and writer agree.  Principal Problem: Schizoaffective disorder, bipolar type [name](HCC) Discharge Diagnoses: Principal Problem:   Schizoaffective disorder, [name] Active Problems:   Cannabis use disorder, severe, dependence (HCC)   Schizoaffective disorder (HCC)   Past Psychiatric History: exstensive  Past Medical History:  Past Medical History:  Diagnosis Date  . Anxiety   . Bipolar affective (HCC)   . OCD (obsessive compulsive disorder)    No past surgical history on file. Family History:  Family History  Problem Relation Age of Onset  . Drug abuse Father   . Mental illness Other    Family Psychiatric  History: nonew data Social History:  Social History   Substance and Sexual Activity  Alcohol Use Yes   Comment: socially     Social History   Substance and Sexual Activity  Drug Use Yes  . Types: Marijuana   Comment: occasionally    Social History   Socioeconomic History  . Marital status: Single    Spouse name: Not on file  . Number of children: Not on file  . Years of education: Not on file  . Highest education level: Not on file  Occupational History  . Occupation: Disability  Social Needs  . Financial resource strain: Not on file  . Food insecurity    Worry: Not on file    Inability: Not on file  . Transportation needs    Medical: Not on file    Non-medical: Not on file  Tobacco Use  .  Smoking status: Current Every Day Smoker    Packs/day: 1.00    Years: 1.00    Pack years: 1.00    Types: Cigarettes  . Smokeless tobacco: Never Used  Substance and Sexual Activity  . Alcohol use: Yes    Comment: socially  . Drug use: Yes    Types: Marijuana    Comment: occasionally  . Sexual activity: Never    Birth control/protection: None  Lifestyle  . Physical activity    Days per week: Not on file    Minutes per session: Not on file  . Stress: Not on file  Relationships  . Social Herbalist on phone: Not on file    Gets together: Not on file    Attends religious service: Not on file    Active member of club or organization: Not on file    Attends meetings of clubs or organizations: Not on file    Relationship status: Not on file  Other Topics Concern  . Not on file  Social History Narrative   Pt stated that she lives with her husband and two children.  Not verified.  Pt is followed by Dr. Jake [name].    Hospital Course:    [name] was admitted under routine precautions, she remained in a rather disorganized state for the first few days she required IM Haldol and lorazepam at very high dose at 4 mg IM on one occasion.  But she was compliant with oral medications and did stabilize.  We escalated her lithium to 450 mg twice a day, use trazodone to help her sleep.  We told her there was absolutely no way she was being discharged without a long-acting injectable on board and at least a verbal commitment to stay on it and she did agree to take Abilify and she received 400 mg IM on 7/27. For overlap with oral meds we added Abilify orally and we also continue the lithium.  She showed great improvement was very close to baseline on 7/29.  On 7/30 she was noted to be alert oriented to person place time day and situation but not exact date.  She had no manic symptoms, no racing thoughts, no disorganized speech or behavior, was coherent in her thoughts and goal-directed.  She had  no thoughts of harming self or others and had no active hallucinations.  She had no EPS no TD and no tardive dyskinesia noted.  No akathisia noted.  We believe she is at her baseline.  Physical Findings: AIMS: Facial and Oral Movements Muscles of Facial Expression: None, normal Lips and Perioral Area: None, normal Jaw: None, normal Tongue: None, normal,Extremity Movements Upper (arms, wrists, hands, fingers): None, normal Lower (legs, knees, ankles, toes): None, normal, Trunk Movements Neck, shoulders, hips: None, normal, Overall Severity Severity of abnormal movements (highest score from questions above): None, normal Incapacitation due to abnormal movements: None, normal Patient's awareness of abnormal movements (rate only patient's report): No Awareness, Dental Status Current problems with teeth and/or dentures?: No Does patient usually wear dentures?: No  CIWA:  CIWA-Ar Total: 0 COWS:     Musculoskeletal: Strength & Muscle Tone: within normal limits  Gait & Station: normal Patient leans: N/A  Psychiatric Specialty Exam: ROS  Blood pressure 103/60, pulse 80, temperature 98.3 F (36.8 C), temperature source Oral, resp. rate 18, SpO2 100 %.There is no height or weight on file to calculate BMI.  General Appearance: Casual  Eye Contact::  Good  Speech:  Clear and Coherent409  Volume:  Normal  Mood:  Euthymic  Affect:  Appropriate and Constricted  Thought Process:  Coherent and Descriptions of Associations: Tangential  Orientation:  Full (Time, Place, and Person)  Thought Content:  Rumination  Suicidal Thoughts:  No  Homicidal Thoughts:  No  Memory:  Immediate;   Fair  Judgement:  Fair  Insight:  Fair  Psychomotor Activity:  Normal  Concentration:  Fair  Recall:  Fair  Fund of Knowledge:Fair  Language: Fair  Akathisia:  Negative  Handed:  Right  AIMS (if indicated):     Assets:  Communication Skills Housing Leisure Time Physical Health  Sleep:  Number of Hours:  5.75  Cognition: WNL  ADL's:  Intact       Has this patient used any form of tobacco in the last 30 days? (Cigarettes, Smokeless Tobacco, Cigars, and/or Pipes) Yes, No  Blood Alcohol level:  Lab Results  Component Value Date   ETH <10 09/27/2018   ETH <10 07/05/2017    Metabolic Disorder Labs:  Lab Results  Component Value Date   HGBA1C 6.1 (H) 09/27/2018   MPG 128.37 09/27/2018   MPG 140 09/30/2014   Lab Results  Component Value Date   PROLACTIN 15.1 09/27/2018   PROLACTIN 150.0 (H) 08/22/2014   Lab Results  Component Value Date   CHOL 168 09/27/2018   TRIG 117 09/27/2018   HDL 22 (L) 09/27/2018   CHOLHDL 7.6 09/27/2018   VLDL 23 09/27/2018   LDLCALC 123 (H) 09/27/2018   LDLCALC 80 09/30/2014    See Psychiatric Specialty Exam and Suicide Risk Assessment completed by Attending Physician prior to discharge.  Discharge destination:  Home  Is patient on multiple antipsychotic therapies at discharge:  No   Has Patient had three or more failed trials of antipsychotic monotherapy by history:  No  Recommended Plan for Multiple Antipsychotic Therapies: NA   Allergies as of 10/04/2018      Reactions   Geodon [ziprasidone] Anaphylaxis, Other (See Comments)   EPS symptoms   Valproic Acid Other (See Comments)   Reaction unknown   Haldol [haloperidol] Other (See Comments)   Lock-jaw, EPS symptoms   Risperidone And Related Other (See Comments)   EPS symptoms      Medication List    STOP taking these medications   asenapine 5 MG Subl 24 hr tablet Commonly known as: SAPHRIS   benztropine 1 MG tablet Commonly known as: COGENTIN   fluPHENAZine 10 MG tablet Commonly known as: PROLIXIN   fluPHENAZine decanoate 25 MG/ML injection Commonly known as: PROLIXIN   hydrOXYzine 50 MG tablet Commonly known as: ATARAX/VISTARIL   lithium carbonate 300 MG capsule Replaced by: lithium carbonate 450 MG CR tablet   LORazepam 1 MG tablet Commonly known as: ATIVAN    nicotine 21 mg/24hr patch Commonly known as: NICODERM CQ - dosed in mg/24 hours     TAKE these medications     Indication  ARIPiprazole 20 MG tablet Commonly known as: ABILIFY Take 1 tablet (20 mg total) by mouth daily.  Indication: MIXED BIPOLAR AFFECTIVE DISORDER   ARIPiprazole ER 400 MG Srer injection Commonly known as: ABILIFY MAINTENA Inject 2 mLs (  400 mg total) into the muscle every 28 (twenty-eight) days. Due 8/25 Start taking on: October 29, 2018  Indication: MIXED BIPOLAR AFFECTIVE DISORDER   diphenhydrAMINE 50 MG capsule Commonly known as: BENADRYL Take 1 capsule (50 mg total) by mouth 3 (three) times daily as needed.  Indication: Hayfever   lithium carbonate 450 MG CR tablet Commonly known as: ESKALITH Take 1 tablet (450 mg total) by mouth every 12 (twelve) hours. Replaces: lithium carbonate 300 MG capsule  Indication: Manic-Depression   trazodone 300 MG tablet Commonly known as: DESYREL Take 1 tablet (300 mg total) by mouth at bedtime. What changed:   medication strength  how much to take  when to take this  reasons to take this  additional instructions  Indication: Trouble Sleeping      Follow-up Information    [facility] Follow up.   Why: ACTT Team. Alinda Deemotnact Billie Matthews at [phone] when dischage date is known for follow up Contact information: [address] [place] [address] [phone]           Follow-up recommendations:  Activity:  full  Comments:  full  SignedMalvin Johns: Glyn Gerads, MD 10/04/2018, 8:04 AM

## 2018-10-04 NOTE — Progress Notes (Signed)
The focus of this group is to help patients establish daily goals to achieve during treatment and discuss how the patient can incorporate goal setting into their daily lives to aide in recovery. 

## 2018-10-04 NOTE — [facility] Suicide Risk Assessment (Signed)
[facility] Discharge Suicide Risk Assessment   Principal Problem: Schizoaffective disorder, bipolar type [facility]) Discharge Diagnoses: Principal Problem:   Schizoaffective disorder, bipolar type ([place]) Active Problems:   Cannabis use disorder, severe, dependence ([place])   Schizoaffective disorder ([place])   Total Time spent with patient: 45 minutes  Musculoskeletal: Strength & Muscle Tone: within normal limits Gait & Station: normal Patient leans: N/A  Psychiatric Specialty Exam: ROS  Blood pressure 103/60, pulse 80, temperature 98.3 F (36.8 C), temperature source Oral, resp. rate 18, SpO2 100 %.There is no height or weight on file to calculate BMI.  General Appearance: Casual  Eye Contact::  Good  Speech:  Clear and Coherent409  Volume:  Normal  Mood:  Euthymic  Affect:  Appropriate and Constricted  Thought Process:  Coherent and Descriptions of Associations: Tangential  Orientation:  Full (Time, Place, and Person)  Thought Content:  Rumination  Suicidal Thoughts:  No  Homicidal Thoughts:  No  Memory:  Immediate;   Fair  Judgement:  Fair  Insight:  Fair  Psychomotor Activity:  Normal  Concentration:  Fair  Recall:  [employer] of Knowledge:Fair  Language: Fair  Akathisia:  Negative  Handed:  Right  AIMS (if indicated):     Assets:  Communication Skills Housing Leisure Time Physical Health  Sleep:  Number of Hours: 5.75  Cognition: WNL  ADL's:  Intact   Mental Status Per Nursing Assessment::   On Admission:  NA  Demographic Factors:  Living alone  Loss Factors: Decrease in vocational status  Historical Factors: NA  Risk Reduction Factors:   Sense of responsibility to family, Religious beliefs about death, Positive social support and Positive therapeutic relationship  Continued Clinical Symptoms: stable  Cognitive Features That Contribute To Risk:  Loss of executive function    Suicide Risk:  Minimal: No identifiable suicidal ideation.  Patients presenting with  no risk factors but with morbid ruminations; may be classified as minimal risk based on the severity of the depressive symptoms  Follow-up Information    [facility] Follow up.   Why: ACTT Team. Lolita Cram at [phone] when dischage date is known for follow up Contact information: [place] [address] [phone]           Plan Of Care/Follow-up recommendations:  Activity:  full  Montine Hight, MD 10/04/2018, 7:58 AM

## 2020-01-02 ENCOUNTER — Other Ambulatory Visit: Payer: Self-pay

## 2020-01-02 ENCOUNTER — Ambulatory Visit: Payer: Medicare Other | Attending: Family Medicine | Admitting: Family Medicine

## 2020-01-02 ENCOUNTER — Encounter: Payer: Self-pay | Admitting: Family Medicine

## 2020-01-02 VITALS — BP 121/85 | HR 96 | Temp 98.2°F | Ht 63.5 in | Wt 219.0 lb

## 2020-01-02 DIAGNOSIS — Z7689 Persons encountering health services in other specified circumstances: Secondary | ICD-10-CM | POA: Diagnosis not present

## 2020-01-02 DIAGNOSIS — E785 Hyperlipidemia, unspecified: Secondary | ICD-10-CM

## 2020-01-02 DIAGNOSIS — Z87898 Personal history of other specified conditions: Secondary | ICD-10-CM

## 2020-01-02 DIAGNOSIS — E119 Type 2 diabetes mellitus without complications: Secondary | ICD-10-CM | POA: Diagnosis not present

## 2020-01-02 LAB — POCT GLYCOSYLATED HEMOGLOBIN (HGB A1C): Hemoglobin A1C: 7.2 % — AB (ref 4.0–5.6)

## 2020-01-02 LAB — GLUCOSE, POCT (MANUAL RESULT ENTRY): POC Glucose: 161 mg/dL — AB (ref 70–99)

## 2020-01-02 MED ORDER — ACCU-CHEK FASTCLIX LANCET KIT: PACK | 11 refills | Status: DC

## 2020-01-02 MED ORDER — ACCU-CHEK GUIDE VI STRP: ORAL_STRIP | 12 refills | Status: DC

## 2020-01-02 MED ORDER — METFORMIN HCL 500 MG PO TABS: 500.0000 mg | ORAL_TABLET | Freq: Two times a day (BID) | ORAL | 1 refills | Status: DC

## 2020-01-02 MED ORDER — ACCU-CHEK GUIDE ME W/DEVICE KIT: PACK | 0 refills | Status: DC

## 2020-01-02 MED ORDER — ACCU-CHEK FASTCLIX LANCETS MISC: 3 refills | Status: DC

## 2020-01-02 NOTE — Progress Notes (Signed)
New Patient Office Visit  Subjective:  [name], female    DOB: 15-Jul-1975  Age: 44 y.o. MRN: [id number]  CC:  Chief Complaint  Patient presents with  . Establish Care    HPI MAISON AGRUSA, 44 year old female, with history of prediabetes, dyslipidemia and bipolar disorder, OCD and schizoaffective disorder who presents to establish care.  She reports that she is currently followed by mental health action team which helps with her compliance with medications.  She denies any acute issues at today's visit.  She has had elevated blood sugars in the past and has had some increased thirst and tends to stay hungry.  She denies any current issues with urinary frequency or discomfort with urination.  Past Medical History:  Diagnosis Date  . Anxiety   . Bipolar affective ([place])   . OCD (obsessive compulsive disorder)   . Schizoaffective disorder ([place]) 2000    Past Surgical History:  Procedure Laterality Date  . no past surgery      Family History  Problem Relation Age of Onset  . Hyperthyroidism Mother   . Diabetes Mother   . Drug abuse Father   . Heart attack Father   . Mental illness Other     Social History   Socioeconomic History  . Marital status: Single    Spouse name: Not on file  . Number of children: Not on file  . Years of education: Not on file  . Highest education level: Not on file  Occupational History  . Occupation: Disability  Tobacco Use  . Smoking status: Current Some Day Smoker    Packs/day: 1.00    Years: 1.00    Pack years: 1.00    Types: Cigarettes  . Smokeless tobacco: Never Used  . Tobacco comment: once a month   Vaping Use  . Vaping Use: Never used  Substance and Sexual Activity  . Alcohol use: Yes    Comment: socially  . Drug use: Yes    Types: Marijuana    Comment: occasionally  . Sexual activity: Never    Birth control/protection: None  Other Topics Concern  . Not on file  Social History Narrative   Pt stated  that she lives with her husband and two children.  Not verified.  Pt is followed by Dr. Jake Samples.   Social Determinants of Health   Financial Resource Strain:   . Difficulty of Paying Living Expenses: Not on file  Food Insecurity:   . Worried About Charity fundraiser in the Last Year: Not on file  . Ran Out of Food in the Last Year: Not on file  Transportation Needs:   . Lack of Transportation (Medical): Not on file  . Lack of Transportation (Non-Medical): Not on file  Physical Activity:   . Days of Exercise per Week: Not on file  . Minutes of Exercise per Session: Not on file  Stress:   . Feeling of Stress : Not on file  Social Connections:   . Frequency of Communication with Friends and Family: Not on file  . Frequency of Social Gatherings with Friends and Family: Not on file  . Attends Religious Services: Not on file  . Active Member of Clubs or Organizations: Not on file  . Attends Archivist Meetings: Not on file  . Marital Status: Not on file  Intimate Partner Violence:   . Fear of Current or Ex-Partner: Not on file  . Emotionally Abused: Not on file  .  Physically Abused: Not on file  . Sexually Abused: Not on file    ROS Review of Systems  Constitutional: Positive for fatigue. Negative for chills and fever.  HENT: Negative for sore throat and trouble swallowing.   Eyes: Negative for photophobia and visual disturbance.  Respiratory: Negative for cough and shortness of breath.   Cardiovascular: Negative for chest pain and palpitations.  Gastrointestinal: Negative for abdominal pain, constipation, diarrhea and nausea.  Endocrine: Positive for polydipsia and polyphagia. Negative for polyuria.  Genitourinary: Negative for dysuria and frequency.  Musculoskeletal: Negative for arthralgias and back pain.  Skin: Negative for rash and wound.  Neurological: Negative for dizziness and headaches.  Hematological: Negative for adenopathy. Does not bruise/bleed easily.    Psychiatric/Behavioral: Negative for suicidal ideas. The patient is nervous/anxious.     Objective:   Today's Vitals: BP 121/85 (BP Location: Left Arm, Patient Position: Sitting)   Pulse 96   Temp 98.2 F (36.8 C)   Ht 5' 3.5" (1.613 m)   Wt 219 lb (99.3 kg)   SpO2 96%   BMI 38.19 kg/m     Physical Exam Vitals and nursing note reviewed.  Constitutional:      General: She is not in acute distress.    Appearance: Normal appearance.  Neck:     Vascular: No carotid bruit.  Cardiovascular:     Rate and Rhythm: Normal rate and regular rhythm.  Pulmonary:     Effort: Pulmonary effort is normal.     Breath sounds: Normal breath sounds.  Abdominal:     Palpations: Abdomen is soft.     Tenderness: There is no abdominal tenderness. There is no right CVA tenderness, left CVA tenderness, guarding or rebound.  Musculoskeletal:     Cervical back: Normal range of motion and neck supple. No tenderness.     Right lower leg: No edema.     Left lower leg: No edema.  Lymphadenopathy:     Cervical: No cervical adenopathy.  Skin:    General: Skin is warm and dry.  Neurological:     Mental Status: She is alert and oriented to person, place, and time.  Psychiatric:        Mood and Affect: Mood normal.        Behavior: Behavior normal.     Assessment & Plan:  1. History of prediabetes On review of chart, patient with hemoglobin A1c of 6.1 on 09/27/2018.  She will have repeat hemoglobin A1c at today's visit and her glucose is elevated at 161.  She is on medication, Abilify, which can increase her risk of diabetes. - Glucose (CBG) - HgB A1c  2. Dyslipidemia (high LDL; low HDL) She has had prior lipid panel on 09/27/2018 which was discussed with the patient as she had low HDL of 22 and LDL cholesterol of 123.  Discussed goal HDL of 40 or greater as well as LDL goal of 70 or less as patient is diabetic.  Discussed recommendation of statin therapy for people with diabetes to help reduce  cardiovascular risk.  Patient is nonfasting at today's visit and will have fasting lipid panel at a future visit.  3. Encounter to establish care 4. New onset type 2 diabetes mellitus (HCC) Hemoglobin A1c at today's visit was 7.2 consistent with diagnosis of type 2 diabetes.  Patient agrees to start the use of Metformin 500 mg twice daily to help with insulin resistance and to lower blood sugars.  She additionally will be referred to nutrition and diabetes  services for diabetic education.  Patient was to have basic metabolic panel at today's visit but apparently left the office as she may not have been aware that she needed to wait to have her blood work done.  Supplies sent to patient's pharmacy for monitoring of blood sugars.  She will hopefully be able to return to clinic in approximately 4 weeks in follow-up of newly diagnosed diabetes.  She is encouraged to call or return sooner if she has any issues or concerns. - Referral to Nutrition and Diabetes Services - metFORMIN (GLUCOPHAGE) 500 MG tablet; Take 1 tablet (500 mg total) by mouth 2 (two) times daily with a meal.  Dispense: 180 tablet; Refill: 1 - Basic Metabolic Panel - Blood Glucose Monitoring Suppl (ACCU-CHEK GUIDE ME) w/Device KIT; Use to check blood sugar once per day  Dispense: 1 kit; Refill: 0 - glucose blood (ACCU-CHEK GUIDE) test strip; Use as instructed  Dispense: 100 each; Refill: 12 - Lancets Misc. (ACCU-CHEK FASTCLIX LANCET) KIT; Use to check blood sugars once per day  Dispense: 1 kit; Refill: 11 - Accu-Chek FastClix Lancets MISC; Use once per day to help check blood sugars  Dispense: 100 each; Refill: 3   Outpatient Encounter Medications as of 01/02/2020  Medication Sig  . ARIPiprazole (ABILIFY) 20 MG tablet Take 1 tablet (20 mg total) by mouth daily.  . ARIPiprazole ER (ABILIFY MAINTENA) 400 MG SRER injection Inject 2 mLs (400 mg total) into the muscle every 28 (twenty-eight) days. Due 8/25  . diphenhydrAMINE (BENADRYL) 50  MG capsule Take 1 capsule (50 mg total) by mouth 3 (three) times daily as needed.  . lithium carbonate (ESKALITH) 450 MG CR tablet Take 1 tablet (450 mg total) by mouth every 12 (twelve) hours.  Marland Kitchen LORazepam (ATIVAN) 1 MG tablet lorazepam 1 mg tablet  . traZODone (DESYREL) 300 MG tablet Take 1 tablet (300 mg total) by mouth at bedtime.  . Accu-Chek FastClix Lancets MISC Use once per day to help check blood sugars  . Blood Glucose Monitoring Suppl (ACCU-CHEK GUIDE ME) w/Device KIT Use to check blood sugar once per day  . glucose blood (ACCU-CHEK GUIDE) test strip Use as instructed  . Lancets Misc. (ACCU-CHEK FASTCLIX LANCET) KIT Use to check blood sugars once per day  . metFORMIN (GLUCOPHAGE) 500 MG tablet Take 1 tablet (500 mg total) by mouth 2 (two) times daily with a meal.   No facility-administered encounter medications on file as of 01/02/2020.    Follow-up: Return in about 4 weeks (around 01/30/2020) for new diabetes.    Antony Blackbird, MD

## 2020-01-02 NOTE — Patient Instructions (Signed)
Living With Diabetes Diabetes (type 1 diabetes mellitus or type 2 diabetes mellitus) is a condition in which the body does not have enough of a hormone called insulin, or the body does not respond properly to insulin. Normally, insulin allows sugars (glucose) to enter cells in the body. The cells use glucose for energy. With diabetes, extra glucose builds up in the blood instead of going into cells, which results in high blood glucose (hyperglycemia). How to manage lifestyle changes Managing diabetes includes medical treatments as well as lifestyle changes. If diabetes is not managed well, serious physical and emotional complications can occur. Taking good care of yourself means that you are responsible for:  Monitoring glucose regularly.  Eating a healthy diet.  Exercising regularly.  Meeting with health care providers.  Taking medicines as directed. Some people may feel a lot of stress about managing their diabetes. This is known as emotional distress, and it is very common. Living with diabetes can place you at risk for emotional distress, depression, or anxiety. These disorders can be confusing and can make diabetes management more difficult. How to recognize stress Emotional distress Symptoms of emotional distress include:  Anger about having a diagnosis of diabetes.  Fear or frustration about your diagnosis and the changes you need to make to manage the condition.  Being overly worried about the care that you need or the cost of the care that you need.  Feeling like you caused your condition by doing something wrong.  Fear of unpredictable situations, like low or high blood glucose.  Feeling judged by your health care providers.  Feeling very alone with the disease.  Getting too tired or worn out with the demands of daily care. Depression Having diabetes means that you are at a higher risk for depression. Having depression also means that you are at a higher risk for  diabetes. Your health care provider may test (screen) you for symptoms of depression. It is important to recognize depression symptoms and to start treatment for depression soon after it is diagnosed. The following are some symptoms of depression:  Loss of interest in things that you used to enjoy.  Trouble sleeping, or often waking up early and not being able to get back to sleep.  A change in appetite.  Feeling tired most of the day.  Feeling nervous and anxious.  Feeling guilty and worrying that you are a burden to others.  Feeling depressed more often than you do not feel that way.  Thoughts of hurting yourself or feeling that you want to die. If you have any of these symptoms for 2 weeks or longer, reach out to a health care provider. Follow these instructions at home: Managing emotional distress The following are some ways to manage emotional distress:  Talk with your health care provider or certified diabetes educator. Consider working with a counselor or therapist.  Learn as much as you can about diabetes and its treatment. Meet with a certified diabetes educator or take a class to learn how to manage your condition.  Keep a journal of your thoughts and concerns.  Accept that some things are out of your control.  Talk with other people who have diabetes. It can help to talk with others about the emotional distress that you feel.  Find ways to manage stress that work for you. These may include art or music therapy, exercise, meditation, and hobbies.  Seek support from spiritual leaders, family, and friends. General instructions  Follow your diabetes management plan.    Keep all follow-up visits as told by your health care provider. This is important. Where to find support   Ask your health care provider to recommend a therapist who understands both depression and diabetes.  Search for information and support from the American Diabetes Association:  www.diabetes.org  Find a certified diabetes educator and make an appointment through American Association of Diabetes Educators: www.diabeteseducator.org Get help right away if:  You have thoughts about hurting yourself or others. If you ever feel like you may hurt yourself or others, or have thoughts about taking your own life, get help right away. You can go to your nearest emergency department or call:  Your local emergency services (911 in the [place]  A suicide crisis helpline, such as the National Suicide Prevention Lifeline at 503-710-1346. This is open 24 hours a day. Summary  Diabetes (type 1 diabetes mellitus or type 2 diabetes mellitus) is a condition in which the body does not have enough of a hormone called insulin, or the body does not respond properly to insulin.  Living with diabetes puts you at risk for medical issues, and it also puts you at risk for emotional issues such as emotional distress, depression, and anxiety.  Recognizing the symptoms of emotional distress and depression may help you avoid problems with your diabetes control. It is important to start treatment for emotional distress and depression soon after they are diagnosed.  Having diabetes means that you are at a higher risk for depression. Ask your health care provider to recommend a therapist who understands both depression and diabetes.  If you experience symptoms of emotional distress or depression, it is important to discuss this with your health care provider, certified diabetes educator, or therapist. This information is not intended to replace advice given to you by your health care provider. Make sure you discuss any questions you have with your health care provider. Document Revised: 03/05/2018 Document Reviewed: 07/07/2016 Elsevier Patient Education  2020 Elsevier Inc.  Type 2 Diabetes Mellitus, Diagnosis, Adult Type 2 diabetes (type 2 diabetes mellitus) is a long-term (chronic) disease. It may  be caused by one or both of these problems:  Your pancreas does not make enough of a hormone called insulin.  Your body does not react in a normal way to insulin that it makes. Insulin lets sugars (glucose) go into cells in your body. This gives you energy. If you have type 2 diabetes, sugars cannot get into cells. This causes high blood sugar (hyperglycemia). Your doctor will set treatment goals for you. Generally, you should have these blood sugar levels:  Before meals (preprandial): 80-130 mg/dL (7.3-7.1 mmol/L).  After meals (postprandial): below 180 mg/dL (10 mmol/L).  A1c (hemoglobin A1c) level: less than 7%. Follow these instructions at home: Questions to ask your doctor  You may want to ask these questions: ? Do I need to meet with a diabetes educator? ? Where can I find a support group for people with diabetes? ? What equipment will I need to care for myself at home? ? What diabetes medicines do I need? When should I take them? ? How often do I need to check my blood sugar? ? What number can I call if I have questions? ? When is my next doctor's visit? General instructions  Take over-the-counter and prescription medicines only as told by your doctor.  Keep all follow-up visits as told by your doctor. This is important. Contact a doctor if:  Your blood sugar is at or above 240 mg/dL (  13.3 mmol/L) for 2 days in a row.  You have been sick for 2 days or more, and you are not getting better.  You have had a fever for 2 days or more, and you are not getting better.  You have any of these problems for more than 6 hours: ? You cannot eat or drink. ? You feel sick to your stomach (nauseous). ? You throw up (vomit). ? You have watery poop (diarrhea). Get help right away if:  Your blood sugar is lower than 54 mg/dL (3 mmol/L).  You get confused.  You have trouble: ? Thinking clearly. ? Breathing.  You have moderate or large ketone levels in your pee  (urine). Summary  Type 2 diabetes is a long-term (chronic) disease. Your pancreas may not make enough of a hormone called insulin, or your body may not react normally to insulin that it makes.  Take over-the-counter and prescription medicines only as told by your doctor.  Keep all follow-up visits as told by your doctor. This is important. This information is not intended to replace advice given to you by your health care provider. Make sure you discuss any questions you have with your health care provider. Document Revised: 04/21/2017 Document Reviewed: 03/27/2015 Elsevier Patient Education  2020 [employer].

## 2020-02-20 ENCOUNTER — Ambulatory Visit: Payer: Medicare Other

## 2020-02-27 ENCOUNTER — Ambulatory Visit: Payer: Medicare Other

## 2020-03-05 ENCOUNTER — Ambulatory Visit: Payer: Medicare Other

## 2020-03-12 ENCOUNTER — Ambulatory Visit: Payer: Medicare Other

## 2020-03-19 ENCOUNTER — Ambulatory Visit: Payer: Medicare Other

## 2020-03-26 ENCOUNTER — Ambulatory Visit: Payer: Medicare Other

## 2020-03-26 ENCOUNTER — Ambulatory Visit: Payer: Medicare Other | Admitting: Dietician

## 2020-04-02 ENCOUNTER — Ambulatory Visit: Payer: Medicare Other

## 2020-04-09 ENCOUNTER — Ambulatory Visit: Payer: Medicare Other | Admitting: Dietician

## 2020-04-30 ENCOUNTER — Ambulatory Visit: Payer: Medicare Other | Admitting: Dietician

## 2020-07-02 ENCOUNTER — Ambulatory Visit: Payer: Medicare Other | Admitting: Dietician

## 2020-12-17 ENCOUNTER — Ambulatory Visit: Payer: Medicare Other | Attending: Physician Assistant | Admitting: Physician Assistant

## 2020-12-17 ENCOUNTER — Encounter: Payer: Self-pay | Admitting: Physician Assistant

## 2020-12-17 ENCOUNTER — Other Ambulatory Visit: Payer: Self-pay

## 2020-12-17 VITALS — BP 148/90 | HR 110 | Ht 63.5 in | Wt 219.2 lb

## 2020-12-17 DIAGNOSIS — Z79899 Other long term (current) drug therapy: Secondary | ICD-10-CM | POA: Diagnosis not present

## 2020-12-17 DIAGNOSIS — Z7984 Long term (current) use of oral hypoglycemic drugs: Secondary | ICD-10-CM | POA: Diagnosis not present

## 2020-12-17 DIAGNOSIS — E1165 Type 2 diabetes mellitus with hyperglycemia: Secondary | ICD-10-CM | POA: Diagnosis not present

## 2020-12-17 DIAGNOSIS — Z888 Allergy status to other drugs, medicaments and biological substances status: Secondary | ICD-10-CM | POA: Insufficient documentation

## 2020-12-17 DIAGNOSIS — R030 Elevated blood-pressure reading, without diagnosis of hypertension: Secondary | ICD-10-CM | POA: Diagnosis not present

## 2020-12-17 LAB — POCT GLYCOSYLATED HEMOGLOBIN (HGB A1C): Hemoglobin A1C: 7.1 % — AB (ref 4.0–5.6)

## 2020-12-17 LAB — GLUCOSE, POCT (MANUAL RESULT ENTRY): POC Glucose: 133 mg/dl — AB (ref 70–99)

## 2020-12-17 MED ORDER — METFORMIN HCL 500 MG PO TABS: 500.0000 mg | ORAL_TABLET | Freq: Two times a day (BID) | ORAL | 1 refills | Status: DC

## 2020-12-17 NOTE — Patient Instructions (Signed)
Check your blood pressure 3 times weekly and record and bring to your next appt.      Hypertension, Adult High blood pressure (hypertension) is when the force of blood pumping through the arteries is too strong. The arteries are the blood vessels that carry blood from the heart throughout the body. Hypertension forces the heart to work harder to pump blood and may cause arteries to become narrow or stiff. Untreated or uncontrolled hypertension can cause a heart attack, heart failure, a stroke, kidney disease, and other problems. A blood pressure reading consists of a higher number over a lower number. Ideally, your blood pressure should be below 120/80. The first ("top") number is called the systolic pressure. It is a measure of the pressure in your arteries as your heart beats. The second ("bottom") number is called the diastolic pressure. It is a measure of the pressure in your arteries as the heart relaxes. What are the causes? The exact cause of this condition is not known. There are some conditions that result in or are related to high blood pressure. What increases the risk? Some risk factors for high blood pressure are under your control. The following factors may make you more likely to develop this condition: Smoking. Having type 2 diabetes mellitus, high cholesterol, or both. Not getting enough exercise or physical activity. Being overweight. Having too much fat, sugar, calories, or salt (sodium) in your diet. Drinking too much alcohol. Some risk factors for high blood pressure may be difficult or impossible to change. Some of these factors include: Having chronic kidney disease. Having a family history of high blood pressure. Age. Risk increases with age. Race. You may be at higher risk if you are African American. Gender. Men are at higher risk than women before age 56. After age 58, women are at higher risk than men. Having obstructive sleep apnea. Stress. What are the signs or  symptoms? High blood pressure may not cause symptoms. Very high blood pressure (hypertensive crisis) may cause: Headache. Anxiety. Shortness of breath. Nosebleed. Nausea and vomiting. Vision changes. Severe chest pain. Seizures. How is this diagnosed? This condition is diagnosed by measuring your blood pressure while you are seated, with your arm resting on a flat surface, your legs uncrossed, and your feet flat on the floor. The cuff of the blood pressure monitor will be placed directly against the skin of your upper arm at the level of your heart. It should be measured at least twice using the same arm. Certain conditions can cause a difference in blood pressure between your right and left arms. Certain factors can cause blood pressure readings to be lower or higher than normal for a short period of time: When your blood pressure is higher when you are in a health care provider's office than when you are at home, this is called white coat hypertension. Most people with this condition do not need medicines. When your blood pressure is higher at home than when you are in a health care provider's office, this is called masked hypertension. Most people with this condition may need medicines to control blood pressure. If you have a high blood pressure reading during one visit or you have normal blood pressure with other risk factors, you may be asked to: Return on a different day to have your blood pressure checked again. Monitor your blood pressure at home for 1 week or longer. If you are diagnosed with hypertension, you may have other blood or imaging tests to help your  health care provider understand your overall risk for other conditions. How is this treated? This condition is treated by making healthy lifestyle changes, such as eating healthy foods, exercising more, and reducing your alcohol intake. Your health care provider may prescribe medicine if lifestyle changes are not enough to get your  blood pressure under control, and if: Your systolic blood pressure is above 130. Your diastolic blood pressure is above 80. Your personal target blood pressure may vary depending on your medical conditions, your age, and other factors. Follow these instructions at home: Eating and drinking  Eat a diet that is high in fiber and potassium, and low in sodium, added sugar, and fat. An example eating plan is called the DASH (Dietary Approaches to Stop Hypertension) diet. To eat this way: Eat plenty of fresh fruits and vegetables. Try to fill one half of your plate at each meal with fruits and vegetables. Eat whole grains, such as whole-wheat pasta, brown rice, or whole-grain bread. Fill about one fourth of your plate with whole grains. Eat or drink low-fat dairy products, such as skim milk or low-fat yogurt. Avoid fatty cuts of meat, processed or cured meats, and poultry with skin. Fill about one fourth of your plate with lean proteins, such as fish, chicken without skin, beans, eggs, or tofu. Avoid pre-made and processed foods. These tend to be higher in sodium, added sugar, and fat. Reduce your daily sodium intake. Most people with hypertension should eat less than 1,500 mg of sodium a day. Do not drink alcohol if: Your health care provider tells you not to drink. You are pregnant, may be pregnant, or are planning to become pregnant. If you drink alcohol: Limit how much you use to: 0-1 drink a day for women. 0-2 drinks a day for men. Be aware of how much alcohol is in your drink. In the [place] one drink equals one 12 oz bottle of beer (355 mL), one 5 oz glass of wine (148 mL), or one 1 oz glass of hard liquor (44 mL). Lifestyle  Work with your health care provider to maintain a healthy body weight or to lose weight. Ask what an ideal weight is for you. Get at least 30 minutes of exercise most days of the week. Activities may include walking, swimming, or biking. Include exercise to strengthen  your muscles (resistance exercise), such as Pilates or lifting weights, as part of your weekly exercise routine. Try to do these types of exercises for 30 minutes at least 3 days a week. Do not use any products that contain nicotine or tobacco, such as cigarettes, e-cigarettes, and chewing tobacco. If you need help quitting, ask your health care provider. Monitor your blood pressure at home as told by your health care provider. Keep all follow-up visits as told by your health care provider. This is important. Medicines Take over-the-counter and prescription medicines only as told by your health care provider. Follow directions carefully. Blood pressure medicines must be taken as prescribed. Do not skip doses of blood pressure medicine. Doing this puts you at risk for problems and can make the medicine less effective. Ask your health care provider about side effects or reactions to medicines that you should watch for. Contact a health care provider if you: Think you are having a reaction to a medicine you are taking. Have headaches that keep coming back (recurring). Feel dizzy. Have swelling in your ankles. Have trouble with your vision. Get help right away if you: Develop a severe headache or  confusion. Have unusual weakness or numbness. Feel faint. Have severe pain in your chest or abdomen. Vomit repeatedly. Have trouble breathing. Summary Hypertension is when the force of blood pumping through your arteries is too strong. If this condition is not controlled, it may put you at risk for serious complications. Your personal target blood pressure may vary depending on your medical conditions, your age, and other factors. For most people, a normal blood pressure is less than 120/80. Hypertension is treated with lifestyle changes, medicines, or a combination of both. Lifestyle changes include losing weight, eating a healthy, low-sodium diet, exercising more, and limiting alcohol. This  information is not intended to replace advice given to you by your health care provider. Make sure you discuss any questions you have with your health care provider. Document Revised: 11/01/2017 Document Reviewed: 11/01/2017 Elsevier Patient Education  2022 [employer].

## 2020-12-17 NOTE — Progress Notes (Signed)
com

## 2020-12-17 NOTE — Progress Notes (Signed)
[name], female   DOB: [birth year], 45 y.o.   MRN: [id number]   [name], is a 45 y.o. female  [id number]  [id number]  DOB - December 27, 1975  Chief Complaint  Patient presents with   Diabetes       Subjective:   [name] is a 45 y.o. female here today for a pap.  However, she is not due for one until 02/2021.  She has not been taking metformin for at least 6 months.  She says the pharmacy didn't send her anymore so she assumed she did not need to take it anymore.  Tolerated metformin fine when she was taking it.    Patient says BP only elevated in doctor's office.    She says she is stable on psych meds.  Followed by [facility].  Denies SI.    Patient has No headache, No chest pain, No abdominal pain - No Nausea, No new weakness tingling or numbness, No Cough - SOB.  No problems updated.  ALLERGIES: Allergies  Allergen Reactions   Geodon [Ziprasidone] Anaphylaxis and Other (See Comments)    EPS symptoms   Haldol [Haloperidol] Other (See Comments)    Lock-jaw, EPS symptoms   Risperidone And Related Other (See Comments)    EPS symptoms   Valproic Acid Other (See Comments)    Reaction unknown    PAST MEDICAL HISTORY: Past Medical History:  Diagnosis Date   Anxiety    Bipolar affective ([place])    OCD (obsessive compulsive disorder)    Schizoaffective disorder ([place]) 2000    MEDICATIONS AT HOME: Prior to Admission medications   Medication Sig Start Date End Date Taking? Authorizing Provider  Accu-Chek FastClix Lancets MISC Use once per day to help check blood sugars 01/02/20  Yes Fulp, Cammie, MD  ARIPiprazole (ABILIFY) 20 MG tablet Take 1 tablet (20 mg total) by mouth daily. 10/04/18  Yes Johnn Hai, MD  ARIPiprazole ER (ABILIFY MAINTENA) 400 MG SRER injection Inject 2 mLs (400 mg total) into the muscle every 28 (twenty-eight) days. Due 8/25 10/29/18  Yes Johnn Hai, MD  Blood Glucose Monitoring Suppl (ACCU-CHEK GUIDE ME) w/Device KIT Use to  check blood sugar once per day 01/02/20  Yes Fulp, Cammie, MD  lithium carbonate (ESKALITH) 450 MG CR tablet Take 1 tablet (450 mg total) by mouth every 12 (twelve) hours. 10/04/18  Yes Johnn Hai, MD  LORazepam (ATIVAN) 1 MG tablet lorazepam 1 mg tablet   Yes [provider]  diphenhydrAMINE (BENADRYL) 50 MG capsule Take 1 capsule (50 mg total) by mouth 3 (three) times daily as needed. Patient not taking: Reported on 12/17/2020 10/04/18   Johnn Hai, MD  glucose blood (ACCU-CHEK GUIDE) test strip Use as instructed Patient not taking: Reported on 12/17/2020 01/02/20   Antony Blackbird, MD  Lancets Misc. (ACCU-CHEK FASTCLIX LANCET) KIT Use to check blood sugars once per day Patient not taking: Reported on 12/17/2020 01/02/20   Antony Blackbird, MD  metFORMIN (GLUCOPHAGE) 500 MG tablet Take 1 tablet (500 mg total) by mouth 2 (two) times daily with a meal. 12/17/20   Tyrome Donatelli, Dionne Bucy, PA-C  traZODone (DESYREL) 300 MG tablet Take 1 tablet (300 mg total) by mouth at bedtime. Patient not taking: Reported on 12/17/2020 10/04/18   Johnn Hai, MD    ROS: Neg HEENT Neg resp Neg cardiac Neg GI Neg GU Neg MS Neg psych Neg neuro  Objective:   Vitals:   12/17/20 1535  BP: (!) 148/90  Pulse: (!) 110  SpO2: 99%  Weight: 219 lb 4 oz (99.5 kg)  Height: 5' 3.5" (1.613 m)   Exam General appearance : Awake, alert, not in any distress. Speech Clear. Not toxic looking HEENT: Atraumatic and Normocephalic Neck: Supple, no JVD. No cervical lymphadenopathy.  Chest: Good air entry bilaterally, CTAB.  No rales/rhonchi/wheezing CVS: S1 S2 regular, no murmurs.  Anxious affect.  TP linear Extremities: B/L Lower Ext shows no edema, both legs are warm to touch Neurology: Awake alert, and oriented X 3, CN II-XII intact, Non focal Skin: No Rash  Data Review Lab Results  Component Value Date   HGBA1C 7.1 (A) 12/17/2020   HGBA1C 7.2 (A) 01/02/2020   HGBA1C 6.1 (H) 09/27/2018    Assessment & Plan    1. Type 2 diabetes mellitus with hyperglycemia, unspecified whether long term insulin use (HCC) Resume meds - Glucose (CBG) - HgB A1c - Comprehensive metabolic panel - Lipid panel - CBC with Differential/Platelet - metFORMIN (GLUCOPHAGE) 500 MG tablet; Take 1 tablet (500 mg total) by mouth 2 (two) times daily with a meal.  Dispense: 180 tablet; Refill: 1 I have had a lengthy discussion and provided education about insulin resistance and the intake of too much sugar/refined carbohydrates.  I have advised the patient to work at a goal of eliminating sugary drinks, candy, desserts, sweets, refined sugars, processed foods, and white carbohydrates.  The patient expresses understanding.    2. Elevated blood pressure reading Check bp OOO and record 3 times weekly and bring to next visit    Patient have been counseled extensively about nutrition and exercise. Other issues discussed during this visit include: low cholesterol diet, weight control and daily exercise, foot care, annual eye examinations at Ophthalmology, importance of adherence with medications and regular follow-up. We also discussed long term complications of uncontrolled diabetes and hypertension.   Return in about 2 months (around 02/16/2021) for assign new PCP and pap and BP check.  The patient was given clear instructions to go to ER or return to medical center if symptoms don't improve, worsen or new problems develop. The patient verbalized understanding. The patient was told to call to get lab results if they haven't heard anything in the next week.      Freeman Caldron, PA-C [facility] [place], [place]   12/17/2020, 3:58 PM

## 2020-12-18 LAB — CBC WITH DIFFERENTIAL/PLATELET
Basophils Absolute: 0 10*3/uL (ref 0.0–0.2)
Basos: 0 %
EOS (ABSOLUTE): 0.1 10*3/uL (ref 0.0–0.4)
Eos: 1 %
Hematocrit: 30.4 % — ABNORMAL LOW (ref 34.0–46.6)
Hemoglobin: 9.6 g/dL — ABNORMAL LOW (ref 11.1–15.9)
Immature Grans (Abs): 0 10*3/uL (ref 0.0–0.1)
Immature Granulocytes: 0 %
Lymphocytes Absolute: 1.9 10*3/uL (ref 0.7–3.1)
Lymphs: 24 %
MCH: 23.4 pg — ABNORMAL LOW (ref 26.6–33.0)
MCHC: 31.6 g/dL (ref 31.5–35.7)
MCV: 74 fL — ABNORMAL LOW (ref 79–97)
Monocytes Absolute: 0.5 10*3/uL (ref 0.1–0.9)
Monocytes: 7 %
Neutrophils Absolute: 5.3 10*3/uL (ref 1.4–7.0)
Neutrophils: 68 %
Platelets: 386 10*3/uL (ref 150–450)
RBC: 4.1 x10E6/uL (ref 3.77–5.28)
RDW: 17.3 % — ABNORMAL HIGH (ref 11.7–15.4)
WBC: 7.8 10*3/uL (ref 3.4–10.8)

## 2020-12-18 LAB — COMPREHENSIVE METABOLIC PANEL
ALT: 20 IU/L (ref 0–32)
AST: 23 IU/L (ref 0–40)
Albumin/Globulin Ratio: 1.3 (ref 1.2–2.2)
Albumin: 4.5 g/dL (ref 3.8–4.8)
Alkaline Phosphatase: 71 IU/L (ref 44–121)
BUN/Creatinine Ratio: 14 (ref 9–23)
BUN: 14 mg/dL (ref 6–24)
Bilirubin Total: <0.2 mg/dL (ref 0.0–1.2)
CO2: 19 mmol/L — ABNORMAL LOW (ref 20–29)
Calcium: 9.5 mg/dL (ref 8.7–10.2)
Chloride: 104 mmol/L (ref 96–106)
Creatinine, Ser: 1.03 mg/dL — ABNORMAL HIGH (ref 0.57–1.00)
Globulin, Total: 3.5 g/dL (ref 1.5–4.5)
Glucose: 120 mg/dL — ABNORMAL HIGH (ref 70–99)
Potassium: 4.8 mmol/L (ref 3.5–5.2)
Sodium: 138 mmol/L (ref 134–144)
Total Protein: 8 g/dL (ref 6.0–8.5)
eGFR: 69 mL/min/{1.73_m2} (ref >59)

## 2020-12-18 LAB — LIPID PANEL
Chol/HDL Ratio: 5.8 ratio — ABNORMAL HIGH (ref 0.0–4.4)
Cholesterol, Total: 168 mg/dL (ref 100–199)
HDL: 29 mg/dL — ABNORMAL LOW (ref >39)
LDL Chol Calc (NIH): 117 mg/dL — ABNORMAL HIGH (ref 0–99)
Triglycerides: 120 mg/dL (ref 0–149)
VLDL Cholesterol Cal: 22 mg/dL (ref 5–40)

## 2020-12-23 ENCOUNTER — Other Ambulatory Visit: Payer: Self-pay | Admitting: Physician Assistant

## 2020-12-23 ENCOUNTER — Telehealth (INDEPENDENT_AMBULATORY_CARE_PROVIDER_SITE_OTHER): Payer: Self-pay

## 2020-12-23 MED ORDER — ATORVASTATIN CALCIUM 20 MG PO TABS: 20.0000 mg | ORAL_TABLET | Freq: Every day | ORAL | 3 refills | Status: DC

## 2020-12-23 MED ORDER — IRON (FERROUS SULFATE) 325 (65 FE) MG PO TABS: 325.0000 mg | ORAL_TABLET | Freq: Every day | ORAL | 1 refills | Status: DC

## 2020-12-23 NOTE — Telephone Encounter (Signed)
-----   Message from Anders Simmonds, [place] sent at 12/23/2020  9:04 AM EDT ----- Please call patient.  Tell her to drink 80-100 ounces water daily to help her kidney function.  Liver function and electrolytes are normal.  "Bad cholesterol" is elevated.  I have sent a medication for her to start for this.  She is anemic.  I have sent her a prescription of iron to take daily.  The water and iron will also help with energy levels.  Follow up as planned.  Thanks, Georgian Co, PA-C

## 2020-12-23 NOTE — Telephone Encounter (Signed)
Spoke with patient she verified date of birth. Aware of normal liver function and electrolytes. Advised to drink 80-100 ounces of water to help with kidney function. "Bad cholesterol" elevated, prescription sent to pharmacy. You are anemic, iron prescription sent. Both water and iron will help with energy levels. Follow up as planned. She verbalized understanding. Maryjean Morn, CMA

## 2021-01-08 ENCOUNTER — Other Ambulatory Visit: Payer: Self-pay | Admitting: Physician Assistant

## 2021-01-08 MED ORDER — ATORVASTATIN CALCIUM 20 MG PO TABS: 20.0000 mg | ORAL_TABLET | Freq: Every day | ORAL | 3 refills | Status: DC

## 2021-01-08 MED ORDER — IRON (FERROUS SULFATE) 325 (65 FE) MG PO TABS: 325.0000 mg | ORAL_TABLET | Freq: Every day | ORAL | 1 refills | Status: DC

## 2021-01-08 NOTE — Telephone Encounter (Signed)
Patient called in says about medication she says Dr Sharon Seller told she would put her on for iron and BP, nothing is at pharmacy at .    Please call back [facility] - [place], [place] - [address]  [address] [place] [address]  Phone: [phone] Fax: [phone]

## 2021-01-08 NOTE — Telephone Encounter (Signed)
Patient called to verify the medications she's calling about. She says that when she received her labs she thinks she was told medication was being sent in for her BP and being anemic. I advised lab results note her bad cholesterol is elevated and a medication for this will be sent; anemic and iron to take daily will be sent for this. I advised Atorvastatin and Ferrous Sulfate was sent on 12/23/20 to [facility]. She says they told her they didn't have anything for her. I advised I will call the pharmacy and send in if needed. [employer] called and spoke to [employer] consultant about the refill(s) Atorvastatin and Ferrous Sulfate requested. Advised it was sent on 12/23/20 #90/3 refill(s) for Atorvastatin and Ferrous Sulfate #100/1 refill. She says they did not receive them. I advised they will be resent electronically.

## 2021-01-08 NOTE — Telephone Encounter (Signed)
Requested Prescriptions  Pending Prescriptions Disp Refills  . Iron, Ferrous Sulfate, 325 (65 Fe) MG TABS 100 tablet 1    Sig: Take 325 mg by mouth daily.     Endocrinology:  Minerals - Iron Supplementation Failed - 01/08/2021  5:58 PM      Failed - HGB in normal range and within 360 days    Hemoglobin  Date Value Ref Range Status  12/17/2020 9.6 (L) 11.1 - 15.9 g/dL Final         Failed - HCT in normal range and within 360 days    Hematocrit  Date Value Ref Range Status  12/17/2020 30.4 (L) 34.0 - 46.6 % Final         Failed - Fe (serum) in normal range and within 360 days    No results found for: IRON, IRONPCTSAT       Failed - Ferritin in normal range and within 360 days    No results found for: FERRITIN       Passed - RBC in normal range and within 360 days    RBC  Date Value Ref Range Status  12/17/2020 4.10 3.77 - 5.28 x10E6/uL Final  09/27/2018 4.47 3.87 - 5.11 MIL/uL Final         Passed - Valid encounter within last 12 months    Recent Outpatient Visits          3 weeks ago Type 2 diabetes mellitus with hyperglycemia, unspecified whether long term insulin use [facility])   [place] [facility] [place], [place], [place]   1 year ago History of prediabetes   [facility] [place], [place], [place]             . atorvastatin (LIPITOR) 20 MG tablet 90 tablet 3    Sig: Take 1 tablet (20 mg total) by mouth daily.     Cardiovascular:  Antilipid - Statins Failed - 01/08/2021  5:58 PM      Failed - LDL in normal range and within 360 days    LDL Chol Calc (NIH)  Date Value Ref Range Status  12/17/2020 117 (H) 0 - 99 mg/dL Final         Failed - HDL in normal range and within 360 days    HDL  Date Value Ref Range Status  12/17/2020 29 (L) >39 mg/dL Final         Passed - Total Cholesterol in normal range and within 360 days    Cholesterol, Total  Date Value Ref Range Status  12/17/2020 168 100 - 199 mg/dL Final          Passed - Triglycerides in normal range and within 360 days    Triglycerides  Date Value Ref Range Status  12/17/2020 120 0 - 149 mg/dL Final         Passed - Patient is not pregnant      Passed - Valid encounter within last 12 months    Recent Outpatient Visits          3 weeks ago Type 2 diabetes mellitus with hyperglycemia, unspecified whether long term insulin use [facility])   [place] [facility] [place], [place], [place]   1 year ago History of prediabetes   [facility] [place], [place], [place]

## 2021-04-22 ENCOUNTER — Ambulatory Visit: Payer: Medicare Other | Admitting: Physician Assistant

## 2021-05-26 ENCOUNTER — Other Ambulatory Visit: Payer: Self-pay | Admitting: Physician Assistant

## 2021-05-26 DIAGNOSIS — E1165 Type 2 diabetes mellitus with hyperglycemia: Secondary | ICD-10-CM

## 2021-06-05 ENCOUNTER — Other Ambulatory Visit: Payer: Self-pay | Admitting: Physician Assistant

## 2021-06-05 DIAGNOSIS — E1165 Type 2 diabetes mellitus with hyperglycemia: Secondary | ICD-10-CM

## 2021-06-07 NOTE — Telephone Encounter (Signed)
Requested Prescriptions  ?Pending Prescriptions Disp Refills  ?? metFORMIN (GLUCOPHAGE) 500 MG tablet [Pharmacy Med Name: METFORMIN HYDROCHLOR 500MG  TAB] 180 tablet 0  ?  Sig: Take 1 tablet (500 mg total) by mouth 2 (two) times daily with a meal. MUST KEEP SCHEDULED APPOINTMENT FOR ADDITIONAL REFILLS  ?  ? Endocrinology:  Diabetes - Biguanides Failed - 06/05/2021  2:10 PM  ?  ?  Failed - Cr in normal range and within 360 days  ?  Creatinine, Ser  ?Date Value Ref Range Status  ?12/17/2020 1.03 (H) 0.57 - 1.00 mg/dL Final  ? ?Creatinine,U  ?Date Value Ref Range Status  ?02/27/2007   Final  ? 65.6 ?(NOTE)  Cutoff Values for Urine Drug Screen:        Drug Class           Cutoff (ng/mL)        Amphetamines            1000        Barbiturates             200        Cocaine Metabolites      300        Benzodiazepines          200        Methadone       ?          300        Opiates                 2000        Phencyclidine             25        Propoxyphene             300        Marijuana Metabolites     50  For medical purposes only.  ?   ?  ?  Failed - B12 Level in normal range and within 720 days  ?  No results found for: VITAMINB12   ?  ?  Passed - HBA1C is between 0 and 7.9 and within 180 days  ?  Hemoglobin A1C  ?Date Value Ref Range Status  ?12/17/2020 7.1 (A) 4.0 - 5.6 % Final  ? ?Hgb A1c MFr Bld  ?Date Value Ref Range Status  ?09/27/2018 6.1 (H) 4.8 - 5.6 % Final  ?  Comment:  ?  (NOTE) ?Pre diabetes:          5.7%-6.4% ?Diabetes:              >6.4% ?Glycemic control for   <7.0% ?adults with diabetes ?  ?   ?  ?  Passed - eGFR in normal range and within 360 days  ?  GFR calc Af Amer  ?Date Value Ref Range Status  ?09/27/2018 58 (L) >60 mL/min Final  ? ?GFR calc non Af Amer  ?Date Value Ref Range Status  ?09/27/2018 50 (L) >60 mL/min Final  ? ?eGFR  ?Date Value Ref Range Status  ?12/17/2020 69 >59 mL/min/1.73 Final  ?   ?  ?  Passed - Valid encounter within last 6 months  ?  Recent Outpatient Visits   ?      ? 5  months ago Type 2 diabetes mellitus with hyperglycemia, unspecified whether long term insulin use ([place])  ? [place], [place], [place]  ? 1 year ago  History of prediabetes  ? [place] Antony Blackbird, MD  ?  ?  ?Future Appointments   ?        ? In 2 months Gildardo Pounds, NP [place]  ?  ? ?  ?  ?  Passed - CBC within normal limits and completed in the last 12 months  ?  WBC  ?Date Value Ref Range Status  ?12/17/2020 7.8 3.4 - 10.8 x10E3/uL Final  ?09/27/2018 7.8 4.0 - 10.5 K/uL Final  ? ?RBC  ?Date Value Ref Range Status  ?12/17/2020 4.10 3.77 - 5.28 x10E6/uL Final  ?09/27/2018 4.47 3.87 - 5.11 MIL/uL Final  ? ?Hemoglobin  ?Date Value Ref Range Status  ?12/17/2020 9.6 (L) 11.1 - 15.9 g/dL Final  ? ?Hematocrit  ?Date Value Ref Range Status  ?12/17/2020 30.4 (L) 34.0 - 46.6 % Final  ? ?MCHC  ?Date Value Ref Range Status  ?12/17/2020 31.6 31.5 - 35.7 g/dL Final  ?09/27/2018 32.4 30.0 - 36.0 g/dL Final  ? ?MCH  ?Date Value Ref Range Status  ?12/17/2020 23.4 (L) 26.6 - 33.0 pg Final  ?09/27/2018 27.3 26.0 - 34.0 pg Final  ? ?MCV  ?Date Value Ref Range Status  ?12/17/2020 74 (L) 79 - 97 fL Final  ? ?No results found for: PLTCOUNTKUC, LABPLAT, [place] ?RDW  ?Date Value Ref Range Status  ?12/17/2020 17.3 (H) 11.7 - 15.4 % Final  ? ?  ?  ?  ? ?

## 2021-06-07 NOTE — Telephone Encounter (Signed)
Patient called and advised she will need to establish care with a new provider at the office per last OV note [name], [place]. Patient verbalized understanding, appointment scheduled.  ?

## 2021-07-20 ENCOUNTER — Other Ambulatory Visit: Payer: Self-pay | Admitting: Physician Assistant

## 2021-08-23 ENCOUNTER — Ambulatory Visit: Payer: Medicare Other | Attending: Nurse Practitioner | Admitting: Nurse Practitioner

## 2021-08-23 ENCOUNTER — Encounter: Payer: Self-pay | Admitting: Nurse Practitioner

## 2021-08-23 VITALS — BP 139/97 | HR 122 | Temp 98.9°F | Resp 12 | Wt 216.0 lb

## 2021-08-23 DIAGNOSIS — Z833 Family history of diabetes mellitus: Secondary | ICD-10-CM | POA: Insufficient documentation

## 2021-08-23 DIAGNOSIS — F250 Schizoaffective disorder, bipolar type: Secondary | ICD-10-CM | POA: Diagnosis not present

## 2021-08-23 DIAGNOSIS — D649 Anemia, unspecified: Secondary | ICD-10-CM | POA: Insufficient documentation

## 2021-08-23 DIAGNOSIS — Z79899 Other long term (current) drug therapy: Secondary | ICD-10-CM | POA: Diagnosis not present

## 2021-08-23 DIAGNOSIS — Z1231 Encounter for screening mammogram for malignant neoplasm of breast: Secondary | ICD-10-CM

## 2021-08-23 DIAGNOSIS — Z1159 Encounter for screening for other viral diseases: Secondary | ICD-10-CM | POA: Diagnosis not present

## 2021-08-23 DIAGNOSIS — Z114 Encounter for screening for human immunodeficiency virus [HIV]: Secondary | ICD-10-CM | POA: Diagnosis not present

## 2021-08-23 DIAGNOSIS — F1220 Cannabis dependence, uncomplicated: Secondary | ICD-10-CM | POA: Diagnosis not present

## 2021-08-23 DIAGNOSIS — Z7984 Long term (current) use of oral hypoglycemic drugs: Secondary | ICD-10-CM | POA: Insufficient documentation

## 2021-08-23 DIAGNOSIS — Z7689 Persons encountering health services in other specified circumstances: Secondary | ICD-10-CM | POA: Diagnosis not present

## 2021-08-23 DIAGNOSIS — F12288 Cannabis dependence with other cannabis-induced disorder: Secondary | ICD-10-CM

## 2021-08-23 DIAGNOSIS — E1165 Type 2 diabetes mellitus with hyperglycemia: Secondary | ICD-10-CM | POA: Diagnosis not present

## 2021-08-23 DIAGNOSIS — I10 Essential (primary) hypertension: Secondary | ICD-10-CM

## 2021-08-23 MED ORDER — LOSARTAN POTASSIUM 25 MG PO TABS: 25.0000 mg | ORAL_TABLET | Freq: Every day | ORAL | 0 refills | Status: DC

## 2021-08-23 NOTE — Progress Notes (Signed)
Assessment & Plan:  [name] was seen today for diabetes.  Diagnoses and all orders for this visit:  Encounter to establish care  Breast cancer screening by mammogram -     MM DIGITAL SCREENING BILATERAL; Future  Type 2 diabetes mellitus with hyperglycemia, unspecified whether long term insulin use (HCC) -     CMP14+EGFR -     Microalbumin / creatinine urine ratio -     Thyroid Panel With TSH  Primary hypertension -     losartan (COZAAR) 25 MG tablet; Take 1 tablet (25 mg total) by mouth daily. -     CMP14+EGFR -     Thyroid Panel With TSH  Encounter for screening for HIV -     HIV antibody (with reflex)  Need for hepatitis C screening test -     HCV Ab w Reflex to Quant PCR  Anemia, unspecified type -     CBC with Differential -     Iron, TIBC and Ferritin Panel -     Thyroid Panel With TSH  Schizoaffective disorder, bipolar type Continue all medications as prescribed.  Follow up with behavioral health as scheduled  Cannabis dependence with other cannabis-induced disorder  Encouraged to stop smoking     Patient has been counseled on age-appropriate routine health concerns for screening and prevention. These are reviewed and up-to-date. Referrals have been placed accordingly. Immunizations are up-to-date or declined.    Subjective:   Chief Complaint  Patient presents with   Diabetes   HPI [name] 46 y.o. female presents to office today to establish care.  She has a history of prediabetes, dyslipidemia and bipolar disorder, OCD and schizoaffective disorder.  She is currently followed by a mental health action team which helps with her compliance with medications.  She denies any acute issues at today's visit. She receives her medications in a pill pack from the pharmacy .  Patient has been counseled on age-appropriate routine health concerns for screening and prevention. These are reviewed and up-to-date. Referrals have been placed accordingly.  Immunizations are up-to-date or declined.     MAMMOGRAM: Ordered today PAP: Overdue. Schedule for next visit  HTN She has a history of elevated blood pressures. Will start low dose losartan 25 mg today.  BP Readings from Last 3 Encounters:  08/23/21 (!) 139/97  12/17/20 (!) 148/90  01/02/20 121/85     DM Currently not at goal. She is taking metformin 500 mg BID. LDL not at goal however she has  been out of atorvastatin for quite some time.  Lab Results  Component Value Date   HGBA1C 7.1 (A) 12/17/2020    Lab Results  Component Value Date   LDLCALC 117 (H) 12/17/2020    Review of Systems  Constitutional:  Negative for fever, malaise/fatigue and weight loss.  HENT: Negative.  Negative for nosebleeds.   Eyes: Negative.  Negative for blurred vision, double vision and photophobia.  Respiratory: Negative.  Negative for cough and shortness of breath.   Cardiovascular: Negative.  Negative for chest pain, palpitations and leg swelling.  Gastrointestinal: Negative.  Negative for heartburn, nausea and vomiting.  Musculoskeletal: Negative.  Negative for myalgias.  Neurological: Negative.  Negative for dizziness, focal weakness, seizures and headaches.  Psychiatric/Behavioral: Negative.  Negative for suicidal ideas.     Past Medical History:  Diagnosis Date   Anxiety    Bipolar affective ([place])    OCD (obsessive compulsive disorder)    Schizoaffective disorder ([place]) 2000  Past Surgical History:  Procedure Laterality Date   no past surgery      Family History  Problem Relation Age of Onset   Hyperthyroidism Mother    Diabetes Mother    Drug abuse Father    Heart attack Father    Mental illness Other     Social History Reviewed with no changes to be made today.   Outpatient Medications Prior to Visit  Medication Sig Dispense Refill   benztropine (COGENTIN) 1 MG tablet Take 1 mg by mouth 2 (two) times daily.     hydrOXYzine (ATARAX) 50 MG tablet Take 50 mg by mouth 3  (three) times daily as needed.     Accu-Chek FastClix Lancets MISC Use once per day to help check blood sugars 100 each 3   ARIPiprazole ER (ABILIFY MAINTENA) 400 MG SRER injection Inject 2 mLs (400 mg total) into the muscle every 28 (twenty-eight) days. Due 8/25 1 each 11   atorvastatin (LIPITOR) 20 MG tablet Take 1 tablet (20 mg total) by mouth daily. 90 tablet 3   Blood Glucose Monitoring Suppl (ACCU-CHEK GUIDE ME) w/Device KIT Use to check blood sugar once per day 1 kit 0   glucose blood (ACCU-CHEK GUIDE) test strip Use as instructed (Patient not taking: Reported on 12/17/2020) 100 each 12   Iron, Ferrous Sulfate, 325 (65 Fe) MG TABS TAKE ONE TABLET BY MOUTH DAILY 30 tablet 0   Lancets Misc. (ACCU-CHEK FASTCLIX LANCET) KIT Use to check blood sugars once per day (Patient not taking: Reported on 12/17/2020) 1 kit 11   LORazepam (ATIVAN) 1 MG tablet lorazepam 1 mg tablet     metFORMIN (GLUCOPHAGE) 500 MG tablet Take 1 tablet (500 mg total) by mouth 2 (two) times daily with a meal. MUST KEEP SCHEDULED APPOINTMENT FOR ADDITIONAL REFILLS 180 tablet 0   ARIPiprazole (ABILIFY) 20 MG tablet Take 1 tablet (20 mg total) by mouth daily. 30 tablet 11   diphenhydrAMINE (BENADRYL) 50 MG capsule Take 1 capsule (50 mg total) by mouth 3 (three) times daily as needed. (Patient not taking: Reported on 12/17/2020) 90 capsule 5   lithium carbonate (ESKALITH) 450 MG CR tablet Take 1 tablet (450 mg total) by mouth every 12 (twelve) hours. 60 tablet 11   traZODone (DESYREL) 300 MG tablet Take 1 tablet (300 mg total) by mouth at bedtime. (Patient not taking: Reported on 12/17/2020) 30 tablet 11   No facility-administered medications prior to visit.    Allergies  Allergen Reactions   Geodon [Ziprasidone] Anaphylaxis and Other (See Comments)    EPS symptoms   Haldol [Haloperidol] Other (See Comments)    Lock-jaw, EPS symptoms   Risperidone And Related Other (See Comments)    EPS symptoms   Valproic Acid Other (See  Comments)    Reaction unknown       Objective:    BP (!) 139/97 (BP Location: Left Leg, Patient Position: Sitting, Cuff Size: Large)   Pulse (!) 122   Temp 98.9 F (37.2 C) (Oral)   Resp 12   Wt 216 lb (98 kg)   LMP 08/22/2021 (Exact Date)   SpO2 98%   BMI 37.66 kg/m  Wt Readings from Last 3 Encounters:  08/23/21 216 lb (98 kg)  12/17/20 219 lb 4 oz (99.5 kg)  01/02/20 219 lb (99.3 kg)    Physical Exam Vitals and nursing note reviewed.  Constitutional:      Appearance: She is well-developed.  HENT:     Head: Normocephalic and atraumatic.  Cardiovascular:     Rate and Rhythm: Regular rhythm. Tachycardia present.     Heart sounds: Normal heart sounds. No murmur heard.    No friction rub. No gallop.  Pulmonary:     Effort: Pulmonary effort is normal. No tachypnea or respiratory distress.     Breath sounds: Normal breath sounds. No decreased breath sounds, wheezing, rhonchi or rales.  Chest:     Chest wall: No tenderness.  Abdominal:     General: Bowel sounds are normal.     Palpations: Abdomen is soft.  Musculoskeletal:        General: Normal range of motion.     Cervical back: Normal range of motion.  Skin:    General: Skin is warm and dry.  Neurological:     Mental Status: She is alert and oriented to person, place, and time.     Coordination: Coordination normal.  Psychiatric:        Attention and Perception: Attention normal.        Behavior: Behavior normal. Behavior is cooperative.        Thought Content: Thought content normal.          Patient has been counseled extensively about nutrition and exercise as well as the importance of adherence with medications and regular follow-up. The patient was given clear instructions to go to ER or return to medical center if symptoms don't improve, worsen or new problems develop. The patient verbalized understanding.   Follow-up: Return for 3 weeks virtual TELE with me BP check. Then schedule for pap in august or  september .   Gildardo Pounds, FNP-BC [facility] and [place], [place]   08/23/2021, 9:02 PM

## 2021-08-23 NOTE — Patient Instructions (Signed)
[facility] [address] [place],  [address] Phone [phone] Fax [phone] 

## 2021-08-24 LAB — CBC WITH DIFFERENTIAL/PLATELET
Basophils Absolute: 0 10*3/uL (ref 0.0–0.2)
Basos: 0 %
EOS (ABSOLUTE): 0.1 10*3/uL (ref 0.0–0.4)
Eos: 1 %
Hematocrit: 34.9 % (ref 34.0–46.6)
Hemoglobin: 11.5 g/dL (ref 11.1–15.9)
Immature Grans (Abs): 0.1 10*3/uL (ref 0.0–0.1)
Immature Granulocytes: 1 %
Lymphocytes Absolute: 1.6 10*3/uL (ref 0.7–3.1)
Lymphs: 20 %
MCH: 27.4 pg (ref 26.6–33.0)
MCHC: 33 g/dL (ref 31.5–35.7)
MCV: 83 fL (ref 79–97)
Monocytes Absolute: 0.6 10*3/uL (ref 0.1–0.9)
Monocytes: 7 %
Neutrophils Absolute: 5.6 10*3/uL (ref 1.4–7.0)
Neutrophils: 71 %
Platelets: 398 10*3/uL (ref 150–450)
RBC: 4.2 x10E6/uL (ref 3.77–5.28)
RDW: 14.9 % (ref 11.7–15.4)
WBC: 7.9 10*3/uL (ref 3.4–10.8)

## 2021-08-24 LAB — CMP14+EGFR
ALT: 18 IU/L (ref 0–32)
AST: 22 IU/L (ref 0–40)
Albumin/Globulin Ratio: 1.2 (ref 1.2–2.2)
Albumin: 4.4 g/dL (ref 3.8–4.8)
Alkaline Phosphatase: 79 IU/L (ref 44–121)
BUN/Creatinine Ratio: 14 (ref 9–23)
BUN: 15 mg/dL (ref 6–24)
Bilirubin Total: 0.2 mg/dL (ref 0.0–1.2)
CO2: 20 mmol/L (ref 20–29)
Calcium: 9.8 mg/dL (ref 8.7–10.2)
Chloride: 105 mmol/L (ref 96–106)
Creatinine, Ser: 1.09 mg/dL — ABNORMAL HIGH (ref 0.57–1.00)
Globulin, Total: 3.8 g/dL (ref 1.5–4.5)
Glucose: 130 mg/dL — ABNORMAL HIGH (ref 70–99)
Potassium: 4.5 mmol/L (ref 3.5–5.2)
Sodium: 138 mmol/L (ref 134–144)
Total Protein: 8.2 g/dL (ref 6.0–8.5)
eGFR: 65 mL/min/{1.73_m2} (ref >59)

## 2021-08-24 LAB — IRON,TIBC AND FERRITIN PANEL
Ferritin: 57 ng/mL (ref 15–150)
Iron Saturation: 8 % — CL (ref 15–55)
Iron: 27 ug/dL (ref 27–159)
Total Iron Binding Capacity: 342 ug/dL (ref 250–450)
UIBC: 315 ug/dL (ref 131–425)

## 2021-08-24 LAB — HCV INTERPRETATION

## 2021-08-24 LAB — HCV AB W REFLEX TO QUANT PCR: HCV Ab: NONREACTIVE

## 2021-08-24 LAB — THYROID PANEL WITH TSH
Free Thyroxine Index: 3.3 (ref 1.2–4.9)
T3 Uptake Ratio: 29 % (ref 24–39)
T4, Total: 11.4 ug/dL (ref 4.5–12.0)
TSH: 1.39 u[IU]/mL (ref 0.450–4.500)

## 2021-08-24 LAB — HIV ANTIBODY (ROUTINE TESTING W REFLEX): HIV Screen 4th Generation wRfx: NONREACTIVE

## 2021-09-02 ENCOUNTER — Ambulatory Visit
Admission: RE | Admit: 2021-09-02 | Discharge: 2021-09-02 | Disposition: A | Discharge status: Home / Self Care | Payer: Medicare Other | Source: Ambulatory Visit | Attending: Nurse Practitioner | Admitting: Nurse Practitioner

## 2021-09-02 DIAGNOSIS — Z1231 Encounter for screening mammogram for malignant neoplasm of breast: Secondary | ICD-10-CM

## 2021-09-06 ENCOUNTER — Other Ambulatory Visit: Payer: Self-pay | Admitting: Nurse Practitioner

## 2021-09-06 DIAGNOSIS — R928 Other abnormal and inconclusive findings on diagnostic imaging of breast: Secondary | ICD-10-CM

## 2021-09-14 ENCOUNTER — Other Ambulatory Visit: Payer: Self-pay | Admitting: Physician Assistant

## 2021-09-14 ENCOUNTER — Telehealth: Payer: Medicare Other | Admitting: Nurse Practitioner

## 2021-09-14 ENCOUNTER — Ambulatory Visit
Admission: RE | Admit: 2021-09-14 | Discharge: 2021-09-14 | Disposition: A | Discharge status: Home / Self Care | Payer: Medicare Other | Source: Ambulatory Visit | Attending: Nurse Practitioner | Admitting: Nurse Practitioner

## 2021-09-14 ENCOUNTER — Other Ambulatory Visit: Payer: Self-pay | Admitting: Nurse Practitioner

## 2021-09-14 DIAGNOSIS — R928 Other abnormal and inconclusive findings on diagnostic imaging of breast: Secondary | ICD-10-CM | POA: Diagnosis not present

## 2021-09-14 DIAGNOSIS — E1165 Type 2 diabetes mellitus with hyperglycemia: Secondary | ICD-10-CM

## 2021-09-14 DIAGNOSIS — R921 Mammographic calcification found on diagnostic imaging of breast: Secondary | ICD-10-CM

## 2021-09-15 ENCOUNTER — Other Ambulatory Visit: Payer: Self-pay | Admitting: Nurse Practitioner

## 2021-09-15 DIAGNOSIS — R928 Other abnormal and inconclusive findings on diagnostic imaging of breast: Secondary | ICD-10-CM

## 2021-11-11 ENCOUNTER — Other Ambulatory Visit: Payer: Self-pay | Admitting: Nurse Practitioner

## 2021-11-11 DIAGNOSIS — I10 Essential (primary) hypertension: Secondary | ICD-10-CM

## 2021-11-24 ENCOUNTER — Ambulatory Visit: Payer: Medicare Other | Attending: Nurse Practitioner | Admitting: Nurse Practitioner

## 2021-11-24 ENCOUNTER — Other Ambulatory Visit (HOSPITAL_COMMUNITY)
Admission: RE | Admit: 2021-11-24 | Discharge: 2021-11-24 | Disposition: A | Discharge status: Home / Self Care | Payer: Medicare Other | Source: Ambulatory Visit | Attending: Nurse Practitioner | Admitting: Nurse Practitioner

## 2021-11-24 ENCOUNTER — Encounter: Payer: Self-pay | Admitting: Nurse Practitioner

## 2021-11-24 VITALS — BP 143/96 | HR 130 | Temp 98.0°F | Ht 63.0 in | Wt 217.0 lb

## 2021-11-24 DIAGNOSIS — Z01419 Encounter for gynecological examination (general) (routine) without abnormal findings: Secondary | ICD-10-CM | POA: Insufficient documentation

## 2021-11-24 DIAGNOSIS — Z114 Encounter for screening for human immunodeficiency virus [HIV]: Secondary | ICD-10-CM | POA: Insufficient documentation

## 2021-11-24 DIAGNOSIS — Z1231 Encounter for screening mammogram for malignant neoplasm of breast: Secondary | ICD-10-CM | POA: Insufficient documentation

## 2021-11-24 DIAGNOSIS — Z79899 Other long term (current) drug therapy: Secondary | ICD-10-CM | POA: Insufficient documentation

## 2021-11-24 DIAGNOSIS — Z124 Encounter for screening for malignant neoplasm of cervix: Secondary | ICD-10-CM

## 2021-11-24 DIAGNOSIS — N760 Acute vaginitis: Secondary | ICD-10-CM | POA: Insufficient documentation

## 2021-11-24 DIAGNOSIS — F319 Bipolar disorder, unspecified: Secondary | ICD-10-CM | POA: Insufficient documentation

## 2021-11-24 DIAGNOSIS — Z1151 Encounter for screening for human papillomavirus (HPV): Secondary | ICD-10-CM | POA: Insufficient documentation

## 2021-11-24 DIAGNOSIS — E785 Hyperlipidemia, unspecified: Secondary | ICD-10-CM | POA: Insufficient documentation

## 2021-11-24 DIAGNOSIS — Z01411 Encounter for gynecological examination (general) (routine) with abnormal findings: Secondary | ICD-10-CM | POA: Insufficient documentation

## 2021-11-24 DIAGNOSIS — B9689 Other specified bacterial agents as the cause of diseases classified elsewhere: Secondary | ICD-10-CM | POA: Insufficient documentation

## 2021-11-24 DIAGNOSIS — F429 Obsessive-compulsive disorder, unspecified: Secondary | ICD-10-CM | POA: Insufficient documentation

## 2021-11-24 DIAGNOSIS — Z7984 Long term (current) use of oral hypoglycemic drugs: Secondary | ICD-10-CM | POA: Insufficient documentation

## 2021-11-24 DIAGNOSIS — E1165 Type 2 diabetes mellitus with hyperglycemia: Secondary | ICD-10-CM | POA: Insufficient documentation

## 2021-11-24 DIAGNOSIS — I10 Essential (primary) hypertension: Secondary | ICD-10-CM | POA: Insufficient documentation

## 2021-11-24 DIAGNOSIS — F259 Schizoaffective disorder, unspecified: Secondary | ICD-10-CM | POA: Insufficient documentation

## 2021-11-24 NOTE — Progress Notes (Signed)
Assessment & Plan:  [name] was seen today for gynecologic exam.  Diagnoses and all orders for this visit:  Encounter for Papanicolaou smear for cervical cancer screening -     Cervicovaginal ancillary only -     Cytology - PAP  Encounter for screening for HIV -     HIV antibody (with reflex)  Type 2 diabetes mellitus with hyperglycemia, unspecified whether long term insulin use ([place]) -     Hemoglobin A1c -     CMP14+EGFR    Patient has been counseled on age-appropriate routine health concerns for screening and prevention. These are reviewed and up-to-date. Referrals have been placed accordingly. Immunizations are up-to-date or declined.    Subjective:   Chief Complaint  Patient presents with   Gynecologic Exam   Gynecologic Exam Pertinent negatives include no abdominal pain, chills, constipation, diarrhea, fever, headaches, nausea, rash or vomiting.   [name] 46 y.o. female presents to office today for well woman exam with PAP smear.   She has a history of prediabetes, dyslipidemia and bipolar disorder, OCD and schizoaffective disorder.  She is currently followed by a mental health action team which helps with her compliance with medications.  She denies any acute issues at today's visit. She receives her medications in a pill pack from the pharmacy .   Patient has been counseled on age-appropriate routine health concerns for screening and prevention. These are reviewed and up-to-date. Referrals have been placed accordingly. Immunizations are up-to-date or declined.     MAMMOGRAM: UTD PAP: UTD   DM 2 Currently not at goal. Taking metformin 500 mg BID as  prescribed. A1c pending today.  Lab Results  Component Value Date   HGBA1C 7.1 (A) 12/17/2020      HTN Blood pressure not controlled. She is currently taking losartan 25 mg daily however she did not take it today. She does have a nurse that comes to the home and checks her BP and states she will send me her  blood pressure log through mychart.  BP Readings from Last 3 Encounters:  11/24/21 (!) 143/96  08/23/21 (!) 139/97  12/17/20 (!) 148/90     Review of Systems  Constitutional: Negative.  Negative for chills, fever, malaise/fatigue and weight loss.  Respiratory: Negative.  Negative for cough, sputum production, shortness of breath and wheezing.   Cardiovascular: Negative.  Negative for chest pain and leg swelling.  Gastrointestinal: Negative.  Negative for abdominal pain, blood in stool, constipation, diarrhea, heartburn, melena, nausea and vomiting.  Genitourinary: Negative.   Skin: Negative.  Negative for rash.  Neurological: Negative.  Negative for dizziness, tremors, speech change, focal weakness, seizures and headaches.  Psychiatric/Behavioral: Negative.  Negative for depression and suicidal ideas. The patient is not nervous/anxious and does not have insomnia.     Past Medical History:  Diagnosis Date   Anxiety    Bipolar affective ([place])    OCD (obsessive compulsive disorder)    Schizoaffective disorder ([place]) 2000    Past Surgical History:  Procedure Laterality Date   no past surgery      Family History  Problem Relation Age of Onset   Hyperthyroidism Mother    Diabetes Mother    Drug abuse Father    Heart attack Father    Mental illness Other    Breast cancer Neg Hx     Social History Reviewed with no changes to be made today.   Outpatient Medications Prior to Visit  Medication Sig Dispense Refill  ARIPiprazole ER (ABILIFY MAINTENA) 400 MG SRER injection Inject 2 mLs (400 mg total) into the muscle every 28 (twenty-eight) days. Due 8/25 1 each 11   atorvastatin (LIPITOR) 20 MG tablet Take 1 tablet (20 mg total) by mouth daily. 90 tablet 3   benztropine (COGENTIN) 1 MG tablet Take 1 mg by mouth 2 (two) times daily.     hydrOXYzine (ATARAX) 50 MG tablet Take 50 mg by mouth 3 (three) times daily as needed.     LORazepam (ATIVAN) 1 MG tablet lorazepam 1 mg tablet      losartan (COZAAR) 25 MG tablet TAKE ONE TABLET BY MOUTH DAILY 90 tablet 0   metFORMIN (GLUCOPHAGE) 500 MG tablet Take 1 tablet (500 mg total) by mouth 2 (two) times daily with a meal. 180 tablet 1   Accu-Chek FastClix Lancets MISC Use once per day to help check blood sugars (Patient not taking: Reported on 11/24/2021) 100 each 3   Blood Glucose Monitoring Suppl (ACCU-CHEK GUIDE ME) w/Device KIT Use to check blood sugar once per day (Patient not taking: Reported on 11/24/2021) 1 kit 0   glucose blood (ACCU-CHEK GUIDE) test strip Use as instructed (Patient not taking: Reported on 12/17/2020) 100 each 12   Iron, Ferrous Sulfate, 325 (65 Fe) MG TABS TAKE ONE TABLET BY MOUTH DAILY (Patient not taking: Reported on 11/24/2021) 30 tablet 0   Lancets Misc. (ACCU-CHEK FASTCLIX LANCET) KIT Use to check blood sugars once per day (Patient not taking: Reported on 12/17/2020) 1 kit 11   No facility-administered medications prior to visit.    Allergies  Allergen Reactions   Geodon [Ziprasidone] Anaphylaxis and Other (See Comments)    EPS symptoms   Haldol [Haloperidol] Other (See Comments)    Lock-jaw, EPS symptoms   Risperidone And Related Other (See Comments)    EPS symptoms   Valproic Acid Other (See Comments)    Reaction unknown       Objective:    BP (!) 143/96   Pulse (!) 130   Temp 98 F (36.7 C) (Oral)   Ht $R'5\' 3"'Xe$  (1.6 m)   Wt 217 lb (98.4 kg)   LMP 11/01/2021 (Approximate)   SpO2 99%   BMI 38.44 kg/m  Wt Readings from Last 3 Encounters:  11/24/21 217 lb (98.4 kg)  08/23/21 216 lb (98 kg)  12/17/20 219 lb 4 oz (99.5 kg)    Physical Exam Exam conducted with a chaperone present.  Constitutional:      Appearance: She is well-developed.  HENT:     Head: Normocephalic.  Cardiovascular:     Rate and Rhythm: Normal rate and regular rhythm.     Heart sounds: Normal heart sounds.  Pulmonary:     Effort: Pulmonary effort is normal.     Breath sounds: Normal breath sounds.  Abdominal:      General: Bowel sounds are normal.     Palpations: Abdomen is soft.     Hernia: There is no hernia in the left inguinal area.  Genitourinary:    Exam position: Lithotomy position.     Labia:        Right: No rash, tenderness, lesion or injury.        Left: No rash, tenderness, lesion or injury.      Vagina: Normal. No signs of injury and foreign body. No vaginal discharge, erythema, tenderness or bleeding.     Cervix: Normal.     Uterus: Not deviated and not enlarged.      Adnexa:  Right: No mass, tenderness or fullness.         Left: No mass, tenderness or fullness.       Rectum: Normal. No external hemorrhoid.  Lymphadenopathy:     Lower Body: No right inguinal adenopathy. No left inguinal adenopathy.  Skin:    General: Skin is warm and dry.  Neurological:     Mental Status: She is alert and oriented to person, place, and time.  Psychiatric:        Behavior: Behavior normal.        Thought Content: Thought content normal.        Judgment: Judgment normal.          Patient has been counseled extensively about nutrition and exercise as well as the importance of adherence with medications and regular follow-up. The patient was given clear instructions to go to ER or return to medical center if symptoms don't improve, worsen or new problems develop. The patient verbalized understanding.   Follow-up: Return in 3 months (on 03/04/2022) for cancel appt next week and schedule to see me end of december.   Gildardo Pounds, FNP-BC [facility] and [place], [place]   11/24/2021, 3:19 PM

## 2021-11-25 LAB — CERVICOVAGINAL ANCILLARY ONLY
Bacterial Vaginitis (gardnerella): POSITIVE — AB
Candida Glabrata: NEGATIVE
Candida Vaginitis: NEGATIVE
Chlamydia: NEGATIVE
Comment: NEGATIVE
Comment: NEGATIVE
Comment: NEGATIVE
Comment: NEGATIVE
Comment: NEGATIVE
Comment: NORMAL
Neisseria Gonorrhea: NEGATIVE
Trichomonas: NEGATIVE

## 2021-11-25 LAB — HIV ANTIBODY (ROUTINE TESTING W REFLEX): HIV Screen 4th Generation wRfx: NONREACTIVE

## 2021-11-25 LAB — CMP14+EGFR
ALT: 19 IU/L (ref 0–32)
AST: 20 IU/L (ref 0–40)
Albumin/Globulin Ratio: 1.2 (ref 1.2–2.2)
Albumin: 4.7 g/dL (ref 3.9–4.9)
Alkaline Phosphatase: 90 IU/L (ref 44–121)
BUN/Creatinine Ratio: 15 (ref 9–23)
BUN: 17 mg/dL (ref 6–24)
Bilirubin Total: 0.5 mg/dL (ref 0.0–1.2)
CO2: 18 mmol/L — ABNORMAL LOW (ref 20–29)
Calcium: 9.8 mg/dL (ref 8.7–10.2)
Chloride: 102 mmol/L (ref 96–106)
Creatinine, Ser: 1.17 mg/dL — ABNORMAL HIGH (ref 0.57–1.00)
Globulin, Total: 3.9 g/dL (ref 1.5–4.5)
Glucose: 139 mg/dL — ABNORMAL HIGH (ref 70–99)
Potassium: 4.4 mmol/L (ref 3.5–5.2)
Sodium: 136 mmol/L (ref 134–144)
Total Protein: 8.6 g/dL — ABNORMAL HIGH (ref 6.0–8.5)
eGFR: 59 mL/min/{1.73_m2} — ABNORMAL LOW (ref >59)

## 2021-11-25 LAB — HEMOGLOBIN A1C
Est. average glucose Bld gHb Est-mCnc: 146 mg/dL
Hgb A1c MFr Bld: 6.7 % — ABNORMAL HIGH (ref 4.8–5.6)

## 2021-11-29 ENCOUNTER — Other Ambulatory Visit: Payer: Self-pay | Admitting: Nurse Practitioner

## 2021-11-29 MED ORDER — METRONIDAZOLE 500 MG PO TABS
500.0000 mg | ORAL_TABLET | Freq: Two times a day (BID) | ORAL | 0 refills | Status: AC
Start: 2021-11-29 — End: 2021-12-06

## 2021-12-01 LAB — CYTOLOGY - PAP
Comment: NEGATIVE
Diagnosis: NEGATIVE
High risk HPV: NEGATIVE

## 2021-12-03 ENCOUNTER — Telehealth: Payer: Medicare Other | Admitting: Nurse Practitioner

## 2021-12-23 DIAGNOSIS — L732 Hidradenitis suppurativa: Secondary | ICD-10-CM | POA: Diagnosis not present

## 2022-01-21 ENCOUNTER — Other Ambulatory Visit: Payer: Self-pay | Admitting: Physician Assistant

## 2022-01-21 NOTE — Telephone Encounter (Signed)
Requested Prescriptions  Pending Prescriptions Disp Refills   atorvastatin (LIPITOR) 20 MG tablet [Pharmacy Med Name: ATORVASTATIN CALCIUM 20MG  TAB] 90 tablet 0    Sig: TAKE ONE TABLET BY MOUTH DAILY     Cardiovascular:  Antilipid - Statins Failed - 01/21/2022  9:06 AM      Failed - Lipid Panel in normal range within the last 12 months    Cholesterol, Total  Date Value Ref Range Status  12/17/2020 168 100 - 199 mg/dL Final   LDL Chol Calc (NIH)  Date Value Ref Range Status  12/17/2020 117 (H) 0 - 99 mg/dL Final   HDL  Date Value Ref Range Status  12/17/2020 29 (L) >39 mg/dL Final   Triglycerides  Date Value Ref Range Status  12/17/2020 120 0 - 149 mg/dL Final         Passed - Patient is not pregnant      Passed - Valid encounter within last 12 months    Recent Outpatient Visits           1 month ago Encounter for Papanicolaou smear for cervical cancer screening   [facility] [place], [place], NP   5 months ago Encounter to establish care   [facility] [place], [place], NP   1 year ago Type 2 diabetes mellitus with hyperglycemia, unspecified whether long term insulin use [facility])   [facility] [place], [place], [place]   2 years ago History of prediabetes   [place] [facility] [place], [place], MD       Future Appointments             In 1 month [place], [place], PA-C [facility]

## 2022-02-23 ENCOUNTER — Ambulatory Visit: Payer: Medicare Other | Admitting: Physician Assistant

## 2022-02-23 ENCOUNTER — Ambulatory Visit: Payer: Medicare Other | Admitting: Nurse Practitioner

## 2022-03-08 ENCOUNTER — Other Ambulatory Visit: Payer: Self-pay | Admitting: Physician Assistant

## 2022-03-08 DIAGNOSIS — E1165 Type 2 diabetes mellitus with hyperglycemia: Secondary | ICD-10-CM

## 2022-03-14 ENCOUNTER — Encounter: Payer: Self-pay | Admitting: Physician Assistant

## 2022-03-14 ENCOUNTER — Ambulatory Visit: Payer: Medicare Other | Attending: Nurse Practitioner | Admitting: Physician Assistant

## 2022-03-14 VITALS — BP 128/84 | HR 94 | Ht 68.0 in | Wt 209.2 lb

## 2022-03-14 DIAGNOSIS — Z760 Encounter for issue of repeat prescription: Secondary | ICD-10-CM | POA: Insufficient documentation

## 2022-03-14 DIAGNOSIS — E1165 Type 2 diabetes mellitus with hyperglycemia: Secondary | ICD-10-CM | POA: Insufficient documentation

## 2022-03-14 DIAGNOSIS — I10 Essential (primary) hypertension: Secondary | ICD-10-CM | POA: Diagnosis not present

## 2022-03-14 DIAGNOSIS — Z7984 Long term (current) use of oral hypoglycemic drugs: Secondary | ICD-10-CM | POA: Diagnosis not present

## 2022-03-14 DIAGNOSIS — Z79899 Other long term (current) drug therapy: Secondary | ICD-10-CM | POA: Insufficient documentation

## 2022-03-14 LAB — POCT GLYCOSYLATED HEMOGLOBIN (HGB A1C): HbA1c, POC (controlled diabetic range): 6.1 % (ref 0.0–7.0)

## 2022-03-14 LAB — GLUCOSE, POCT (MANUAL RESULT ENTRY): POC Glucose: 116 mg/dl — AB (ref 70–99)

## 2022-03-14 MED ORDER — LOSARTAN POTASSIUM 25 MG PO TABS: 25.0000 mg | ORAL_TABLET | Freq: Every day | ORAL | 4 refills | Status: DC

## 2022-03-14 MED ORDER — ACCU-CHEK FASTCLIX LANCETS MISC: 3 refills | Status: DC

## 2022-03-14 MED ORDER — ACCU-CHEK GUIDE VI STRP: ORAL_STRIP | 12 refills | Status: DC

## 2022-03-14 MED ORDER — METFORMIN HCL 500 MG PO TABS: 500.0000 mg | ORAL_TABLET | Freq: Two times a day (BID) | ORAL | 4 refills | Status: DC

## 2022-03-14 MED ORDER — ACCU-CHEK FASTCLIX LANCET KIT: PACK | 11 refills | Status: DC

## 2022-03-14 MED ORDER — ATORVASTATIN CALCIUM 20 MG PO TABS: 20.0000 mg | ORAL_TABLET | Freq: Every day | ORAL | 4 refills | Status: DC

## 2022-03-14 NOTE — Progress Notes (Signed)
[name], female   DOB: [birth year], 47 y.o.   MRN: [id number]   [name], is a 47 y.o. female  PIR:[id number]  YTK:[id number]  DOB - 1975-10-06  Chief Complaint  Patient presents with   Diabetes   Medication Refill       Subjective:   [name] is a 47 y.o. female here today for med RF.  She has been working out and eating healthier.  She needs a glucometer.  No new issues or concerns.  She has regular f/ups with her mental health providers.   No problems updated.  ALLERGIES: Allergies  Allergen Reactions   Geodon [Ziprasidone] Anaphylaxis and Other (See Comments)    EPS symptoms   Haldol [Haloperidol] Other (See Comments)    Lock-jaw, EPS symptoms   Risperidone And Related Other (See Comments)    EPS symptoms   Valproic Acid Other (See Comments)    Reaction unknown    PAST MEDICAL HISTORY: Past Medical History:  Diagnosis Date   Anxiety    Bipolar affective (HCC)    OCD (obsessive compulsive disorder)    Schizoaffective disorder (HCC) 2000    MEDICATIONS AT HOME: Prior to Admission medications   Medication Sig Start Date End Date Taking? Authorizing Provider  ARIPiprazole ER (ABILIFY MAINTENA) 400 MG SRER injection Inject 2 mLs (400 mg total) into the muscle every 28 (twenty-eight) days. Due 8/25 10/29/18  Yes Malvin Johns, MD  benztropine (COGENTIN) 1 MG tablet Take 1 mg by mouth 2 (two) times daily.   Yes [provider]  hydrOXYzine (ATARAX) 50 MG tablet Take 50 mg by mouth 3 (three) times daily as needed.   Yes [provider]  LORazepam (ATIVAN) 1 MG tablet lorazepam 1 mg tablet   Yes [provider]  Accu-Chek FastClix Lancets MISC Use once per day to help check blood sugars 03/14/22   Anders Simmonds, PA-C  atorvastatin (LIPITOR) 20 MG tablet Take 1 tablet (20 mg total) by mouth daily. 03/14/22   Anders Simmonds, PA-C  Blood Glucose Monitoring Suppl (ACCU-CHEK GUIDE ME) w/Device KIT Use to check blood  sugar once per day Patient not taking: Reported on 11/24/2021 01/02/20   Fulp, Cammie, MD  glucose blood (ACCU-CHEK GUIDE) test strip Use as instructed 03/14/22   Anders Simmonds, PA-C  Iron, Ferrous Sulfate, 325 (65 Fe) MG TABS TAKE ONE TABLET BY MOUTH DAILY Patient not taking: Reported on 11/24/2021 07/21/21   Hoy Register, MD  Lancets Misc. (ACCU-CHEK FASTCLIX LANCET) KIT Use to check blood sugars once per day 03/14/22   Anders Simmonds, PA-C  losartan (COZAAR) 25 MG tablet Take 1 tablet (25 mg total) by mouth daily. 03/14/22   Anders Simmonds, PA-C  metFORMIN (GLUCOPHAGE) 500 MG tablet Take 1 tablet (500 mg total) by mouth 2 (two) times daily with a meal. 03/14/22   Andrena Margerum, Marzella Schlein, PA-C    ROS: Neg HEENT Neg resp Neg cardiac Neg GI Neg GU Neg MS Neg psych Neg neuro  Objective:   Vitals:   03/14/22 1345 03/14/22 1355  BP: (!) 132/91 128/84  Pulse: (!) 107 94  SpO2: 99%   Weight: 209 lb 3.2 oz (94.9 kg)   Height: 5\' 8"  (1.727 m)    Exam General appearance : Awake, alert, not in any distress. Speech Clear. Not toxic looking HEENT: Atraumatic and Normocephalic Neck: Supple, no JVD. No cervical lymphadenopathy.  Chest: Good air entry bilaterally, CTAB.  No rales/rhonchi/wheezing CVS: S1 S2 regular,  no murmurs.  Extremities: B/L Lower Ext shows no edema, both legs are warm to touch Neurology: Awake alert, and oriented X 3, CN II-XII intact, Non focal Skin: No Rash  Data Review Lab Results  Component Value Date   HGBA1C 6.1 03/14/2022   HGBA1C 6.7 (H) 11/24/2021   HGBA1C 7.1 (A) 12/17/2020    Assessment & Plan   1. Type 2 diabetes mellitus with hyperglycemia, unspecified whether long term insulin use (HCC) Improved!/controlled - Glucose (CBG) - HgB A1c - Comprehensive metabolic panel - metFORMIN (GLUCOPHAGE) 500 MG tablet; Take 1 tablet (500 mg total) by mouth 2 (two) times daily with a meal.  Dispense: 60 tablet; Refill: 4 - Accu-Chek FastClix Lancets MISC; Use  once per day to help check blood sugars  Dispense: 100 each; Refill: 3 - glucose blood (ACCU-CHEK GUIDE) test strip; Use as instructed  Dispense: 100 each; Refill: 12 - Lancets Misc. (ACCU-CHEK FASTCLIX LANCET) KIT; Use to check blood sugars once per day  Dispense: 1 kit; Refill: 11  2. hypertension - losartan (COZAAR) 25 MG tablet; Take 1 tablet (25 mg total) by mouth daily.  Dispense: 30 tablet; Refill: 4    Return in about 4 months (around 07/13/2022) for PCP for chronic conditions.  The patient was given clear instructions to go to ER or return to medical center if symptoms don't improve, worsen or new problems develop. The patient verbalized understanding. The patient was told to call to get lab results if they haven't heard anything in the next week.      Freeman Caldron, PA-C [facility] [place], [place]   03/14/2022, 2:02 PM

## 2022-03-14 NOTE — Patient Instructions (Signed)
Drink 80-100 ounces water daily to improve kidney function

## 2022-03-15 LAB — COMPREHENSIVE METABOLIC PANEL
ALT: 10 IU/L (ref 0–32)
AST: 11 IU/L (ref 0–40)
Albumin/Globulin Ratio: 1.3 (ref 1.2–2.2)
Albumin: 4.5 g/dL (ref 3.9–4.9)
Alkaline Phosphatase: 75 IU/L (ref 44–121)
BUN/Creatinine Ratio: 14 (ref 9–23)
BUN: 14 mg/dL (ref 6–24)
Bilirubin Total: 0.2 mg/dL (ref 0.0–1.2)
CO2: 19 mmol/L — ABNORMAL LOW (ref 20–29)
Calcium: 9.6 mg/dL (ref 8.7–10.2)
Chloride: 104 mmol/L (ref 96–106)
Creatinine, Ser: 1.01 mg/dL — ABNORMAL HIGH (ref 0.57–1.00)
Globulin, Total: 3.6 g/dL (ref 1.5–4.5)
Glucose: 110 mg/dL — ABNORMAL HIGH (ref 70–99)
Potassium: 4.5 mmol/L (ref 3.5–5.2)
Sodium: 137 mmol/L (ref 134–144)
Total Protein: 8.1 g/dL (ref 6.0–8.5)
eGFR: 70 mL/min/{1.73_m2} (ref >59)

## 2022-03-29 ENCOUNTER — Other Ambulatory Visit: Payer: Medicare Other

## 2022-04-02 ENCOUNTER — Other Ambulatory Visit: Payer: Self-pay | Admitting: Nurse Practitioner

## 2022-04-02 ENCOUNTER — Ambulatory Visit
Admission: RE | Admit: 2022-04-02 | Discharge: 2022-04-02 | Disposition: A | Discharge status: Home / Self Care | Payer: Medicare Other | Source: Ambulatory Visit | Attending: Nurse Practitioner | Admitting: Nurse Practitioner

## 2022-04-02 DIAGNOSIS — R921 Mammographic calcification found on diagnostic imaging of breast: Secondary | ICD-10-CM

## 2022-04-02 DIAGNOSIS — R928 Other abnormal and inconclusive findings on diagnostic imaging of breast: Secondary | ICD-10-CM

## 2022-04-02 DIAGNOSIS — N6320 Unspecified lump in the left breast, unspecified quadrant: Secondary | ICD-10-CM

## 2022-05-09 ENCOUNTER — Telehealth: Payer: Self-pay | Admitting: Nurse Practitioner

## 2022-05-09 NOTE — Telephone Encounter (Signed)
Contacted [name] to schedule their annual wellness visit. Appointment made for 05/13/22.  [name] AWV direct phone # [phone]

## 2022-05-09 NOTE — Telephone Encounter (Signed)
Called patient to schedule Medicare Annual Wellness Visit (AWV). Left message for patient to call back and schedule Medicare Annual Wellness Visit (AWV).  Last date of AWV:  due awvs 02/05/19 per [facility]    If any questions, please contact me at [phone].  Thank you ,  [name] AWV direct phone # [phone]

## 2022-05-13 ENCOUNTER — Ambulatory Visit: Payer: 59 | Attending: Nurse Practitioner

## 2022-05-13 VITALS — Ht 63.0 in | Wt 211.0 lb

## 2022-05-13 DIAGNOSIS — Z0000 Encounter for general adult medical examination without abnormal findings: Secondary | ICD-10-CM

## 2022-05-13 NOTE — Progress Notes (Signed)
I connected with  Raeanne Gathers on 05/13/22 by a audio enabled telemedicine application and verified that I am speaking with the correct person using two identifiers.  Patient Location: Home  Provider Location: Office/Clinic  I discussed the limitations of evaluation and management by telemedicine. The patient expressed understanding and agreed to proceed.  Subjective:   [name] is a 46 y.o. female who presents for Medicare Annual (Subsequent) preventive examination.  Review of Systems     Cardiac Risk Factors include: obesity (BMI >30kg/m2);diabetes mellitus     Objective:    Today's Vitals   05/13/22 1612  Weight: 211 lb (95.7 kg)  Height: '5\' 3"'$  (1.6 m)   Body mass index is 37.38 kg/m.     05/13/2022    4:17 PM 09/26/2018    3:51 PM 07/05/2017    4:56 PM 07/05/2017    4:49 PM 02/08/2017    1:15 PM 02/07/2017    4:03 AM 12/13/2016    5:27 AM  Advanced Directives  Does Patient Have a Medical Advance Directive? No     No No  Would patient like information on creating a medical advance directive?      No - Patient declined No - Patient declined     Information is confidential and restricted. Go to Review Flowsheets to unlock data.    Current Medications (verified) Outpatient Encounter Medications as of 05/13/2022  Medication Sig   Accu-Chek FastClix Lancets MISC Use once per day to help check blood sugars   ARIPiprazole ER (ABILIFY MAINTENA) 400 MG SRER injection Inject 2 mLs (400 mg total) into the muscle every 28 (twenty-eight) days. Due 8/25   atorvastatin (LIPITOR) 20 MG tablet Take 1 tablet (20 mg total) by mouth daily.   benztropine (COGENTIN) 1 MG tablet Take 1 mg by mouth 2 (two) times daily.   hydrOXYzine (ATARAX) 50 MG tablet Take 50 mg by mouth 3 (three) times daily as needed.   LORazepam (ATIVAN) 1 MG tablet lorazepam 1 mg tablet   losartan (COZAAR) 25 MG tablet Take 1 tablet (25 mg total) by mouth daily.   metFORMIN (GLUCOPHAGE) 500 MG tablet Take 1  tablet (500 mg total) by mouth 2 (two) times daily with a meal.   Blood Glucose Monitoring Suppl (ACCU-CHEK GUIDE ME) w/Device KIT Use to check blood sugar once per day (Patient not taking: Reported on 05/13/2022)   glucose blood (ACCU-CHEK GUIDE) test strip Use as instructed (Patient not taking: Reported on 05/13/2022)   Iron, Ferrous Sulfate, 325 (65 Fe) MG TABS TAKE ONE TABLET BY MOUTH DAILY (Patient not taking: Reported on 11/24/2021)   Lancets Misc. (ACCU-CHEK FASTCLIX LANCET) KIT Use to check blood sugars once per day (Patient not taking: Reported on 05/13/2022)   No facility-administered encounter medications on file as of 05/13/2022.    Allergies (verified) Geodon [ziprasidone], Haldol [haloperidol], Risperidone and related, and Valproic acid   History: Past Medical History:  Diagnosis Date   Anxiety    Bipolar affective ([place])    OCD (obsessive compulsive disorder)    Schizoaffective disorder ([place]) 2000   Past Surgical History:  Procedure Laterality Date   no past surgery     Family History  Problem Relation Age of Onset   Hyperthyroidism Mother    Diabetes Mother    Drug abuse Father    Heart attack Father    Mental illness Other    Breast cancer Neg Hx    Social History   Socioeconomic History   Marital  status: Single    Spouse name: Not on file   Number of children: Not on file   Years of education: Not on file   Highest education level: Not on file  Occupational History   Occupation: Disability  Tobacco Use   Smoking status: Former    Packs/day: 1.00    Years: 1.00    Total pack years: 1.00    Types: Cigarettes   Smokeless tobacco: Never   Tobacco comments:    once a month   Vaping Use   Vaping Use: Never used  Substance and Sexual Activity   Alcohol use: Yes    Comment: socially   Drug use: Not Currently    Types: Marijuana    Comment: occasionally   Sexual activity: Never    Birth control/protection: None  Other Topics Concern   Not on file  Social  History Narrative   Pt stated that she lives with her husband and two children.  Not verified.  Pt is followed by Dr. Jake Samples.   Social Determinants of Health   Financial Resource Strain: Low Risk  (05/13/2022)   Overall Financial Resource Strain (CARDIA)    Difficulty of Paying Living Expenses: Not hard at all  Food Insecurity: No Food Insecurity (05/13/2022)   Hunger Vital Sign    Worried About Running Out of Food in the Last Year: Never true    Ran Out of Food in the Last Year: Never true  Transportation Needs: No Transportation Needs (05/13/2022)   PRAPARE - Hydrologist (Medical): No    Lack of Transportation (Non-Medical): No  Physical Activity: Insufficiently Active (05/13/2022)   Exercise Vital Sign    Days of Exercise per Week: 4 days    Minutes of Exercise per Session: 30 min  Stress: No Stress Concern Present (05/13/2022)   [place]    Feeling of Stress : Not at all  Social Connections: Not on file    Tobacco Counseling Counseling given: Not Answered Tobacco comments: once a month    Clinical Intake:  Pre-visit preparation completed: Yes  Pain : No/denies pain     Nutritional Status: BMI > 30  Obese Nutritional Risks: None Diabetes: Yes  How often do you need to have someone help you when you read instructions, pamphlets, or other written materials from your doctor or pharmacy?: 1 - Never  Diabetic? Yes Nutrition Risk Assessment:  Has the patient had any N/V/D within the last 2 months?  No  Does the patient have any non-healing wounds?  No  Has the patient had any unintentional weight loss or weight gain?  Yes   Diabetes:  Is the patient diabetic?  Yes  If diabetic, was a CBG obtained today?  No  Did the patient bring in their glucometer from home?  No  How often do you monitor your CBG's? Does not.   Financial Strains and Diabetes Management:  Are you having any  financial strains with the device, your supplies or your medication? No .  Does the patient want to be seen by Chronic Care Management for management of their diabetes?  No  Would the patient like to be referred to a Nutritionist or for Diabetic Management?  No   Diabetic Exams:  Diabetic Eye Exam: Overdue for diabetic eye exam. Pt has been advised about the importance in completing this exam. Patient advised to call and schedule an eye exam. Diabetic Foot Exam: Overdue, Pt  has been advised about the importance in completing this exam. Pt is scheduled for diabetic foot exam on next appointment.   Interpreter Needed?: No  Information entered by :: NAllen LPN   Activities of Daily Living    05/13/2022    4:18 PM  In your present state of health, do you have any difficulty performing the following activities:  Hearing? 0  Vision? 0  Difficulty concentrating or making decisions? 0  Walking or climbing stairs? 0  Dressing or bathing? 0  Doing errands, shopping? 0  Preparing Food and eating ? N  Using the Toilet? N  In the past six months, have you accidently leaked urine? N  Do you have problems with loss of bowel control? N  Managing your Medications? N  Managing your Finances? N  Housekeeping or managing your Housekeeping? N    Patient Care Team: Gildardo Pounds, NP as PCP - General (Nurse Practitioner)  Indicate any recent Medical Services you may have received from other than [facility] providers in the past year (date may be approximate).     Assessment:   This is a routine wellness examination for [place].  Hearing/Vision screen Vision Screening - Comments:: No regular eye exams,  Dietary issues and exercise activities discussed: Current Exercise Habits: Home exercise routine, Type of exercise: Other - see comments (dancing), Time (Minutes): 30, Frequency (Times/Week): 4, Weekly Exercise (Minutes/Week): 120   Goals Addressed             This Visit's Progress     Patient Stated       05/13/2022, wants to lose weight and eat healthier       Depression Screen    05/13/2022    4:18 PM 03/14/2022    1:48 PM 08/23/2021    4:06 PM 12/17/2020    3:48 PM 01/02/2020    1:46 PM  PHQ 2/9 Scores  PHQ - 2 Score 0 '1 2 2 1  '$ PHQ- 9 Score   '4 4 9    '$ Fall Risk    05/13/2022    4:17 PM 03/14/2022    1:47 PM 11/24/2021    3:07 PM 08/23/2021    4:05 PM 12/17/2020    3:47 PM  [place] in the past year? 0 0 0 0 0  Number falls in past yr: 0 0 0 0 0  Injury with Fall? 0 0 0 0 0  Risk for fall due to : Medication side effect No Fall Risks  No Fall Risks No Fall Risks  Follow up Falls prevention discussed;Education provided;Falls evaluation completed   Falls evaluation completed     FALL RISK PREVENTION PERTAINING TO THE HOME:  Any stairs in or around the home? Yes  If so, are there any without handrails? No  Home free of loose throw rugs in walkways, pet beds, electrical cords, etc? Yes  Adequate lighting in your home to reduce risk of falls? Yes   ASSISTIVE DEVICES UTILIZED TO PREVENT FALLS:  Life alert? No  Use of a cane, walker or w/c? No  Grab bars in the bathroom? No  Shower chair or bench in shower? No  Elevated toilet seat or a handicapped toilet? No   TIMED UP AND GO:  Was the test performed? No .  .     Cognitive Function:        05/13/2022    4:19 PM  6CIT Screen  What Year? 0 points  What month? 0 points  What  time? 0 points  Count back from 20 0 points  Months in reverse 2 points  Repeat phrase 0 points  Total Score 2 points    Immunizations  There is no immunization history on file for this patient.  TDAP status: Due, Education has been provided regarding the importance of this vaccine. Advised may receive this vaccine at local pharmacy or Health Dept. Aware to provide a copy of the vaccination record if obtained from local pharmacy or Health Dept. Verbalized acceptance and understanding.  Flu Vaccine status:  Declined, Education has been provided regarding the importance of this vaccine but patient still declined. Advised may receive this vaccine at local pharmacy or Health Dept. Aware to provide a copy of the vaccination record if obtained from local pharmacy or Health Dept. Verbalized acceptance and understanding.  Pneumococcal vaccine status: Up to date  Covid-19 vaccine status: Declined, Education has been provided regarding the importance of this vaccine but patient still declined. Advised may receive this vaccine at local pharmacy or Health Dept.or vaccine clinic. Aware to provide a copy of the vaccination record if obtained from local pharmacy or Health Dept. Verbalized acceptance and understanding.  Qualifies for Shingles Vaccine? No   Zostavax completed  n/a   Shingrix Completed?: n/a  Screening Tests Health Maintenance  Topic Date Due   COVID-19 Vaccine (1) Never done   Diabetic kidney evaluation - Urine ACR  Never done   DTaP/Tdap/Td (1 - Tdap) Never done   Medicare Annual Wellness (AWV)  02/13/2019   INFLUENZA VACCINE  Never done   Diabetic kidney evaluation - eGFR measurement  03/15/2023   PAP SMEAR-Modifier  11/24/2024   Hepatitis C Screening  Completed   HIV Screening  Completed   HPV VACCINES  Aged Out    Health Maintenance  Health Maintenance Due  Topic Date Due   COVID-19 Vaccine (1) Never done   Diabetic kidney evaluation - Urine ACR  Never done   DTaP/Tdap/Td (1 - Tdap) Never done   Medicare Annual Wellness (AWV)  02/13/2019   INFLUENZA VACCINE  Never done    Colorectal cancer screening: No longer required.   Mammogram status: Completed 04/02/2022. Repeat every year  Bone Density status: n/a  Lung Cancer Screening: (Low Dose CT Chest recommended if Age 2-80 years, 30 pack-year currently smoking OR have quit w/in 15years.) does not qualify.   Lung Cancer Screening Referral: no  Additional Screening:  Hepatitis C Screening: does qualify; Completed  08/23/2021  Vision Screening: Recommended annual ophthalmology exams for early detection of glaucoma and other disorders of the eye. Is the patient up to date with their annual eye exam?  No  Who is the provider or what is the name of the office in which the patient attends annual eye exams? none If pt is not established with a provider, would they like to be referred to a provider to establish care? No .   Dental Screening: Recommended annual dental exams for proper oral hygiene  Community Resource Referral / Chronic Care Management: CRR required this visit?  No   CCM required this visit?  No      Plan:     I have personally reviewed and noted the following in the patient's chart:   Medical and social history Use of alcohol, tobacco or illicit drugs  Current medications and supplements including opioid prescriptions. Patient is not currently taking opioid prescriptions. Functional ability and status Nutritional status Physical activity Advanced directives List of other physicians Hospitalizations, surgeries, and ER  visits in previous 12 months Vitals Screenings to include cognitive, depression, and falls Referrals and appointments  In addition, I have reviewed and discussed with patient certain preventive protocols, quality metrics, and best practice recommendations. A written personalized care plan for preventive services as well as general preventive health recommendations were provided to patient.     Kellie Simmering, LPN   [phone]   Nurse Notes: none  Due to this being a virtual visit, the after visit summary with patients personalized plan was offered to patient via mail or my-chart. Patient would like to access on my-chart

## 2022-05-13 NOTE — Patient Instructions (Signed)
[name] , Thank you for taking time to come for your Medicare Wellness Visit. I appreciate your ongoing commitment to your health goals. Please review the following plan we discussed and let me know if I can assist you in the future.   These are the goals we discussed:  Goals      Patient Stated     05/13/2022, wants to lose weight and eat healthier        This is a list of the screening recommended for you and due dates:  Health Maintenance  Topic Date Due   COVID-19 Vaccine (1) Never done   Yearly kidney health urinalysis for diabetes  Never done   DTaP/Tdap/Td vaccine (1 - Tdap) Never done   Flu Shot  Never done   Yearly kidney function blood test for diabetes  03/15/2023   Medicare Annual Wellness Visit  05/13/2023   Pap Smear  11/24/2024   Hepatitis C Screening: USPSTF Recommendation to screen - Ages 18-79 yo.  Completed   HIV Screening  Completed   HPV Vaccine  Aged Out    Advanced directives: Advance directive discussed with you today.   Conditions/risks identified: none  Next appointment: Follow up in one year for your annual wellness visit.   Preventive Care 40-64 Years, Female Preventive care refers to lifestyle choices and visits with your health care provider that can promote health and wellness. What does preventive care include? A yearly physical exam. This is also called an annual well check. Dental exams once or twice a year. Routine eye exams. Ask your health care provider how often you should have your eyes checked. Personal lifestyle choices, including: Daily care of your teeth and gums. Regular physical activity. Eating a healthy diet. Avoiding tobacco and drug use. Limiting alcohol use. Practicing safe sex. Taking low-dose aspirin daily starting at age 41. Taking vitamin and mineral supplements as recommended by your health care provider. What happens during an annual well check? The services and screenings done by your health care provider  during your annual well check will depend on your age, overall health, lifestyle risk factors, and family history of disease. Counseling  Your health care provider may ask you questions about your: Alcohol use. Tobacco use. Drug use. Emotional well-being. Home and relationship well-being. Sexual activity. Eating habits. Work and work Statistician. Method of birth control. Menstrual cycle. Pregnancy history. Screening  You may have the following tests or measurements: Height, weight, and BMI. Blood pressure. Lipid and cholesterol levels. These may be checked every 5 years, or more frequently if you are over 41 years old. Skin check. Lung cancer screening. You may have this screening every year starting at age 34 if you have a 30-pack-year history of smoking and currently smoke or have quit within the past 15 years. Fecal occult blood test (FOBT) of the stool. You may have this test every year starting at age 47. Flexible sigmoidoscopy or colonoscopy. You may have a sigmoidoscopy every 5 years or a colonoscopy every 10 years starting at age 67. Hepatitis C blood test. Hepatitis B blood test. Sexually transmitted disease (STD) testing. Diabetes screening. This is done by checking your blood sugar (glucose) after you have not eaten for a while (fasting). You may have this done every 1-3 years. Mammogram. This may be done every 1-2 years. Talk to your health care provider about when you should start having regular mammograms. This may depend on whether you have a family history of breast cancer. BRCA-related cancer screening.  This may be done if you have a family history of breast, ovarian, tubal, or peritoneal cancers. Pelvic exam and Pap test. This may be done every 3 years starting at age 79. Starting at age 55, this may be done every 5 years if you have a Pap test in combination with an HPV test. Bone density scan. This is done to screen for osteoporosis. You may have this scan if you are  at high risk for osteoporosis. Discuss your test results, treatment options, and if necessary, the need for more tests with your health care provider. Vaccines  Your health care provider may recommend certain vaccines, such as: Influenza vaccine. This is recommended every year. Tetanus, diphtheria, and acellular pertussis (Tdap, Td) vaccine. You may need a Td booster every 10 years. Zoster vaccine. You may need this after age 83. Pneumococcal 13-valent conjugate (PCV13) vaccine. You may need this if you have certain conditions and were not previously vaccinated. Pneumococcal polysaccharide (PPSV23) vaccine. You may need one or two doses if you smoke cigarettes or if you have certain conditions. Talk to your health care provider about which screenings and vaccines you need and how often you need them. This information is not intended to replace advice given to you by your health care provider. Make sure you discuss any questions you have with your health care provider. Document Released: 03/20/2015 Document Revised: 11/11/2015 Document Reviewed: 12/23/2014 Elsevier Interactive Patient Education  2017 [place] Prevention in the Home Falls can cause injuries. They can happen to people of all ages. There are many things you can do to make your home safe and to help prevent falls. What can I do on the outside of my home? Regularly fix the edges of walkways and driveways and fix any cracks. Remove anything that might make you trip as you walk through a door, such as a raised step or threshold. Trim any bushes or trees on the path to your home. Use bright outdoor lighting. Clear any walking paths of anything that might make someone trip, such as rocks or tools. Regularly check to see if handrails are loose or broken. Make sure that both sides of any steps have handrails. Any raised decks and porches should have guardrails on the edges. Have any leaves, snow, or ice cleared  regularly. Use sand or salt on walking paths during winter. Clean up any spills in your garage right away. This includes oil or grease spills. What can I do in the bathroom? Use night lights. Install grab bars by the toilet and in the tub and shower. Do not use towel bars as grab bars. Use non-skid mats or decals in the tub or shower. If you need to sit down in the shower, use a plastic, non-slip stool. Keep the floor dry. Clean up any water that spills on the floor as soon as it happens. Remove soap buildup in the tub or shower regularly. Attach bath mats securely with double-sided non-slip rug tape. Do not have throw rugs and other things on the floor that can make you trip. What can I do in the bedroom? Use night lights. Make sure that you have a light by your bed that is easy to reach. Do not use any sheets or blankets that are too big for your bed. They should not hang down onto the floor. Have a firm chair that has side arms. You can use this for support while you get dressed. Do not have throw rugs and  other things on the floor that can make you trip. What can I do in the kitchen? Clean up any spills right away. Avoid walking on wet floors. Keep items that you use a lot in easy-to-reach places. If you need to reach something above you, use a strong step stool that has a grab bar. Keep electrical cords out of the way. Do not use floor polish or wax that makes floors slippery. If you must use wax, use non-skid floor wax. Do not have throw rugs and other things on the floor that can make you trip. What can I do with my stairs? Do not leave any items on the stairs. Make sure that there are handrails on both sides of the stairs and use them. Fix handrails that are broken or loose. Make sure that handrails are as long as the stairways. Check any carpeting to make sure that it is firmly attached to the stairs. Fix any carpet that is loose or worn. Avoid having throw rugs at the top or  bottom of the stairs. If you do have throw rugs, attach them to the floor with carpet tape. Make sure that you have a light switch at the top of the stairs and the bottom of the stairs. If you do not have them, ask someone to add them for you. What else can I do to help prevent falls? Wear shoes that: Do not have high heels. Have rubber bottoms. Are comfortable and fit you well. Are closed at the toe. Do not wear sandals. If you use a stepladder: Make sure that it is fully opened. Do not climb a closed stepladder. Make sure that both sides of the stepladder are locked into place. Ask someone to hold it for you, if possible. Clearly mark and make sure that you can see: Any grab bars or handrails. First and last steps. Where the edge of each step is. Use tools that help you move around (mobility aids) if they are needed. These include: Canes. Walkers. Scooters. Crutches. Turn on the lights when you go into a dark area. Replace any light bulbs as soon as they burn out. Set up your furniture so you have a clear path. Avoid moving your furniture around. If any of your floors are uneven, fix them. If there are any pets around you, be aware of where they are. Review your medicines with your doctor. Some medicines can make you feel dizzy. This can increase your chance of falling. Ask your doctor what other things that you can do to help prevent falls. This information is not intended to replace advice given to you by your health care provider. Make sure you discuss any questions you have with your health care provider. Document Released: 12/18/2008 Document Revised: 07/30/2015 Document Reviewed: 03/28/2014 Elsevier Interactive Patient Education  2017 [employer].

## 2022-05-14 ENCOUNTER — Other Ambulatory Visit: Payer: Self-pay | Admitting: Nurse Practitioner

## 2022-05-14 DIAGNOSIS — E119 Type 2 diabetes mellitus without complications: Secondary | ICD-10-CM

## 2022-05-14 MED ORDER — ACCU-CHEK GUIDE ME W/DEVICE KIT: PACK | 0 refills | Status: DC

## 2022-07-13 ENCOUNTER — Ambulatory Visit: Payer: Medicare Other | Admitting: Nurse Practitioner

## 2022-08-03 ENCOUNTER — Other Ambulatory Visit: Payer: Self-pay | Admitting: Physician Assistant

## 2022-08-03 DIAGNOSIS — E1165 Type 2 diabetes mellitus with hyperglycemia: Secondary | ICD-10-CM

## 2022-08-08 ENCOUNTER — Encounter: Payer: Self-pay | Admitting: Nurse Practitioner

## 2022-08-08 ENCOUNTER — Ambulatory Visit: Payer: 59 | Attending: Nurse Practitioner | Admitting: Nurse Practitioner

## 2022-08-08 VITALS — BP 118/79 | HR 118 | Ht 63.0 in | Wt 216.4 lb

## 2022-08-08 DIAGNOSIS — F12288 Cannabis dependence with other cannabis-induced disorder: Secondary | ICD-10-CM

## 2022-08-08 DIAGNOSIS — F250 Schizoaffective disorder, bipolar type: Secondary | ICD-10-CM | POA: Diagnosis not present

## 2022-08-08 DIAGNOSIS — Z7984 Long term (current) use of oral hypoglycemic drugs: Secondary | ICD-10-CM

## 2022-08-08 DIAGNOSIS — D649 Anemia, unspecified: Secondary | ICD-10-CM | POA: Diagnosis not present

## 2022-08-08 DIAGNOSIS — E785 Hyperlipidemia, unspecified: Secondary | ICD-10-CM | POA: Diagnosis not present

## 2022-08-08 DIAGNOSIS — I10 Essential (primary) hypertension: Secondary | ICD-10-CM | POA: Diagnosis not present

## 2022-08-08 DIAGNOSIS — E1165 Type 2 diabetes mellitus with hyperglycemia: Secondary | ICD-10-CM

## 2022-08-08 DIAGNOSIS — E119 Type 2 diabetes mellitus without complications: Secondary | ICD-10-CM | POA: Diagnosis not present

## 2022-08-08 LAB — GLUCOSE, POCT (MANUAL RESULT ENTRY): POC Glucose: 176 mg/dl — AB (ref 70–99)

## 2022-08-08 MED ORDER — ACCU-CHEK GUIDE ME W/DEVICE KIT
PACK | 0 refills | Status: AC
Start: 2022-08-08 — End: ?

## 2022-08-08 MED ORDER — ACCU-CHEK FASTCLIX LANCET KIT: PACK | 11 refills | Status: AC

## 2022-08-08 MED ORDER — LOSARTAN POTASSIUM 25 MG PO TABS
25.0000 mg | ORAL_TABLET | Freq: Every day | ORAL | 3 refills | Status: DC
Start: 2022-08-08 — End: 2022-09-16

## 2022-08-08 MED ORDER — ACCU-CHEK FASTCLIX LANCETS MISC
3 refills | Status: AC
Start: 2022-08-08 — End: ?

## 2022-08-08 MED ORDER — ACCU-CHEK GUIDE VI STRP: ORAL_STRIP | 12 refills | Status: AC

## 2022-08-08 NOTE — Progress Notes (Signed)
a  Assessment & Plan:  [name] was seen today for diabetes.  Diagnoses and all orders for this visit: Type 2 diabetes mellitus with hyperglycemia, unspecified whether long term insulin use (HCC) -     POCT glucose (manual entry) -     CMP14+EGFR -     Hemoglobin A1c -     Microalbumin / creatinine urine ratio -     Blood Glucose Monitoring Suppl (ACCU-CHEK GUIDE ME) w/Device KIT; Use to check blood sugar twice per day -     glucose blood (ACCU-CHEK GUIDE) test strip; Use as instructed. Check blood glucose level by fingerstick twice per day. -     Lancets Misc. (ACCU-CHEK FASTCLIX LANCET) KIT; Use as instructed. Check blood glucose level by fingerstick twice per day. -     Accu-Chek FastClix Lancets MISC; Use as instructed. Check blood glucose level by fingerstick twice per day.  PRIMARY HYPERTENSION Blood pressure well-controlled at goal of less than 130/80 -     losartan (COZAAR) 25 MG tablet; Take 1 tablet (25 mg total) by mouth daily.  Anemia, unspecified type -     CBC with Differential -     Iron, TIBC and Ferritin Panel  Schizoaffective disorder, bipolar type [facility]) Follow-up with psychiatry as instructed.  Cannabis dependence with other cannabis-induced disorder  Encourage complete cessation today  Dyslipidemia, goal LDL below 70 -     Lipid panel   Patient has been counseled on age-appropriate routine health concerns for screening and prevention. These are reviewed and up-to-date. Referrals have been placed accordingly. Immunizations are up-to-date or declined.    Subjective:   Chief Complaint  Patient presents with   Diabetes   HPI [name] 47 y.o. female presents to office today for follow-up to hypertension and diabetes.  She has a history of prediabetes, dyslipidemia and bipolar disorder, OCD and schizoaffective disorder.  She is currently followed by a mental health action team which helps with her compliance with medications.  She denies any acute  issues at today's visit. She receives her medications in a pill pack from the pharmacy .   Patient has been counseled on age-appropriate routine health concerns for screening and prevention. These are reviewed and up-to-date. Referrals have been placed accordingly. Immunizations are up-to-date or declined.     MAMMOGRAM: UTD PAP: UTD  HTN Blood pressure is well-controlled.  She is currently prescribed losartan 25 mg daily. BP Readings from Last 3 Encounters:  08/08/22 118/79  03/14/22 128/84  11/24/21 (!) 143/96      DM 2 Diabetes is well-controlled.  She is currently taking metformin 500 mg BID as  prescribed. Lab Results  Component Value Date   HGBA1C 6.1 03/14/2022     She has a history of anemia however she is currently not taking any iron supplements.  CBC will be ordered today.  Review of Systems  Constitutional:  Negative for fever, malaise/fatigue and weight loss.  HENT: Negative.  Negative for nosebleeds.   Eyes: Negative.  Negative for blurred vision, double vision and photophobia.  Respiratory: Negative.  Negative for cough and shortness of breath.   Cardiovascular: Negative.  Negative for chest pain, palpitations and leg swelling.  Gastrointestinal: Negative.  Negative for heartburn, nausea and vomiting.  Musculoskeletal: Negative.  Negative for myalgias.  Neurological: Negative.  Negative for dizziness, focal weakness, seizures and headaches.  Psychiatric/Behavioral: Negative.  Negative for suicidal ideas.     Past Medical History:  Diagnosis Date   Anxiety  Bipolar affective (HCC)    OCD (obsessive compulsive disorder)    Schizoaffective disorder (HCC) 2000    Past Surgical History:  Procedure Laterality Date   no past surgery      Family History  Problem Relation Age of Onset   Hyperthyroidism Mother    Diabetes Mother    Drug abuse Father    Heart attack Father    Mental illness Other    Breast cancer Neg Hx     Social History Reviewed with  no changes to be made today.   Outpatient Medications Prior to Visit  Medication Sig Dispense Refill   ARIPiprazole ER (ABILIFY MAINTENA) 400 MG SRER injection Inject 2 mLs (400 mg total) into the muscle every 28 (twenty-eight) days. Due 8/25 1 each 11   atorvastatin (LIPITOR) 20 MG tablet Take 1 tablet (20 mg total) by mouth daily. 30 tablet 4   benztropine (COGENTIN) 1 MG tablet Take 1 mg by mouth 2 (two) times daily.     hydrOXYzine (ATARAX) 50 MG tablet Take 50 mg by mouth 3 (three) times daily as needed.     LORazepam (ATIVAN) 1 MG tablet lorazepam 1 mg tablet     metFORMIN (GLUCOPHAGE) 500 MG tablet Take 1 tablet (500 mg total) by mouth 2 (two) times daily with a meal. 60 tablet 4   Accu-Chek FastClix Lancets MISC Use once per day to help check blood sugars 100 each 3   Blood Glucose Monitoring Suppl (ACCU-CHEK GUIDE ME) w/Device KIT Use to check blood sugar once per day 1 kit 0   glucose blood (ACCU-CHEK GUIDE) test strip Use as instructed 100 each 12   Lancets Misc. (ACCU-CHEK FASTCLIX LANCET) KIT Use to check blood sugars once per day 1 kit 11   losartan (COZAAR) 25 MG tablet TAKE ONE TABLET BY MOUTH DAILY 30 tablet 0   Iron, Ferrous Sulfate, 325 (65 Fe) MG TABS TAKE ONE TABLET BY MOUTH DAILY (Patient not taking: Reported on 11/24/2021) 30 tablet 0   No facility-administered medications prior to visit.    Allergies  Allergen Reactions   Geodon [Ziprasidone] Anaphylaxis and Other (See Comments)    EPS symptoms   Haldol [Haloperidol] Other (See Comments)    Lock-jaw, EPS symptoms   Risperidone And Related Other (See Comments)    EPS symptoms   Valproic Acid Other (See Comments)    Reaction unknown       Objective:    BP 118/79 (BP Location: Left Arm, Patient Position: Sitting, Cuff Size: Large)   Pulse (!) 118   Wt 216 lb 6.4 oz (98.2 kg)   SpO2 100%   BMI 38.33 kg/m  Wt Readings from Last 3 Encounters:  08/08/22 216 lb 6.4 oz (98.2 kg)  05/13/22 211 lb (95.7 kg)   03/14/22 209 lb 3.2 oz (94.9 kg)    Physical Exam Vitals and nursing note reviewed.  Constitutional:      Appearance: She is well-developed.  HENT:     Head: Normocephalic and atraumatic.  Eyes:     Comments: Wears glasses  Cardiovascular:     Rate and Rhythm: Normal rate and regular rhythm.     Heart sounds: Normal heart sounds. No murmur heard.    No friction rub. No gallop.  Pulmonary:     Effort: Pulmonary effort is normal. No tachypnea or respiratory distress.     Breath sounds: Normal breath sounds. No decreased breath sounds, wheezing, rhonchi or rales.  Chest:     Chest wall:  No tenderness.  Abdominal:     General: Bowel sounds are normal.     Palpations: Abdomen is soft.  Musculoskeletal:        General: Normal range of motion.     Cervical back: Normal range of motion.  Skin:    General: Skin is warm and dry.  Neurological:     Mental Status: She is alert and oriented to person, place, and time.     Coordination: Coordination normal.  Psychiatric:        Behavior: Behavior normal. Behavior is cooperative.        Thought Content: Thought content normal.        Judgment: Judgment normal.          Patient has been counseled extensively about nutrition and exercise as well as the importance of adherence with medications and regular follow-up. The patient was given clear instructions to go to ER or return to medical center if symptoms don't improve, worsen or new problems develop. The patient verbalized understanding.   Follow-up: Return in about 3 months (around 11/08/2022).   Claiborne Rigg, FNP-BC [facility] [place], [place] [phone]   08/08/2022, 12:05 PM

## 2022-08-09 LAB — LIPID PANEL
Chol/HDL Ratio: 3.3 ratio (ref 0.0–4.4)
Cholesterol, Total: 101 mg/dL (ref 100–199)
HDL: 31 mg/dL — ABNORMAL LOW (ref >39)
LDL Chol Calc (NIH): 54 mg/dL (ref 0–99)
Triglycerides: 75 mg/dL (ref 0–149)
VLDL Cholesterol Cal: 16 mg/dL (ref 5–40)

## 2022-08-09 LAB — CBC WITH DIFFERENTIAL/PLATELET
Basophils Absolute: 0 10*3/uL (ref 0.0–0.2)
Basos: 0 %
EOS (ABSOLUTE): 0.1 10*3/uL (ref 0.0–0.4)
Eos: 1 %
Hematocrit: 32.2 % — ABNORMAL LOW (ref 34.0–46.6)
Hemoglobin: 10.1 g/dL — ABNORMAL LOW (ref 11.1–15.9)
Immature Grans (Abs): 0 10*3/uL (ref 0.0–0.1)
Immature Granulocytes: 1 %
Lymphocytes Absolute: 2.2 10*3/uL (ref 0.7–3.1)
Lymphs: 29 %
MCH: 25.4 pg — ABNORMAL LOW (ref 26.6–33.0)
MCHC: 31.4 g/dL — ABNORMAL LOW (ref 31.5–35.7)
MCV: 81 fL (ref 79–97)
Monocytes Absolute: 0.5 10*3/uL (ref 0.1–0.9)
Monocytes: 6 %
Neutrophils Absolute: 4.7 10*3/uL (ref 1.4–7.0)
Neutrophils: 63 %
Platelets: 346 10*3/uL (ref 150–450)
RBC: 3.98 x10E6/uL (ref 3.77–5.28)
RDW: 14.6 % (ref 11.7–15.4)
WBC: 7.6 10*3/uL (ref 3.4–10.8)

## 2022-08-09 LAB — CMP14+EGFR
ALT: 15 IU/L (ref 0–32)
AST: 12 IU/L (ref 0–40)
Albumin/Globulin Ratio: 1.1 — ABNORMAL LOW (ref 1.2–2.2)
Albumin: 4.1 g/dL (ref 3.9–4.9)
Alkaline Phosphatase: 76 IU/L (ref 44–121)
BUN/Creatinine Ratio: 19 (ref 9–23)
BUN: 20 mg/dL (ref 6–24)
Bilirubin Total: 0.2 mg/dL (ref 0.0–1.2)
CO2: 20 mmol/L (ref 20–29)
Calcium: 9.4 mg/dL (ref 8.7–10.2)
Chloride: 107 mmol/L — ABNORMAL HIGH (ref 96–106)
Creatinine, Ser: 1.06 mg/dL — ABNORMAL HIGH (ref 0.57–1.00)
Globulin, Total: 3.8 g/dL (ref 1.5–4.5)
Glucose: 143 mg/dL — ABNORMAL HIGH (ref 70–99)
Potassium: 4.5 mmol/L (ref 3.5–5.2)
Sodium: 139 mmol/L (ref 134–144)
Total Protein: 7.9 g/dL (ref 6.0–8.5)
eGFR: 66 mL/min/{1.73_m2} (ref >59)

## 2022-08-09 LAB — MICROALBUMIN / CREATININE URINE RATIO
Creatinine, Urine: 233.5 mg/dL
Microalb/Creat Ratio: 5 mg/g creat (ref 0–29)
Microalbumin, Urine: 12.3 ug/mL

## 2022-08-09 LAB — HEMOGLOBIN A1C
Est. average glucose Bld gHb Est-mCnc: 166 mg/dL
Hgb A1c MFr Bld: 7.4 % — ABNORMAL HIGH (ref 4.8–5.6)

## 2022-08-09 LAB — IRON,TIBC AND FERRITIN PANEL
Ferritin: 20 ng/mL (ref 15–150)
Iron Saturation: 8 % — CL (ref 15–55)
Iron: 30 ug/dL (ref 27–159)
Total Iron Binding Capacity: 354 ug/dL (ref 250–450)
UIBC: 324 ug/dL (ref 131–425)

## 2022-08-11 ENCOUNTER — Other Ambulatory Visit: Payer: Self-pay | Admitting: Nurse Practitioner

## 2022-08-11 DIAGNOSIS — E1165 Type 2 diabetes mellitus with hyperglycemia: Secondary | ICD-10-CM

## 2022-08-11 MED ORDER — IRON (FERROUS SULFATE) 325 (65 FE) MG PO TABS: 1.0000 | ORAL_TABLET | Freq: Every day | ORAL | 3 refills | Status: DC

## 2022-09-16 ENCOUNTER — Other Ambulatory Visit: Payer: Self-pay | Admitting: Physician Assistant

## 2022-09-16 DIAGNOSIS — E1165 Type 2 diabetes mellitus with hyperglycemia: Secondary | ICD-10-CM

## 2022-10-03 ENCOUNTER — Other Ambulatory Visit: Payer: Medicare Other

## 2022-10-06 ENCOUNTER — Other Ambulatory Visit: Payer: Medicare Other

## 2022-10-24 ENCOUNTER — Ambulatory Visit
Admission: RE | Admit: 2022-10-24 | Discharge: 2022-10-24 | Disposition: A | Discharge status: Home / Self Care | Payer: 59 | Source: Ambulatory Visit | Attending: Nurse Practitioner | Admitting: Nurse Practitioner

## 2022-10-24 DIAGNOSIS — R921 Mammographic calcification found on diagnostic imaging of breast: Secondary | ICD-10-CM

## 2022-10-24 DIAGNOSIS — R928 Other abnormal and inconclusive findings on diagnostic imaging of breast: Secondary | ICD-10-CM

## 2022-10-24 DIAGNOSIS — N6322 Unspecified lump in the left breast, upper inner quadrant: Secondary | ICD-10-CM | POA: Diagnosis not present

## 2022-10-24 DIAGNOSIS — N6320 Unspecified lump in the left breast, unspecified quadrant: Secondary | ICD-10-CM

## 2023-01-04 ENCOUNTER — Ambulatory Visit: Payer: 59 | Admitting: Physician Assistant

## 2023-01-18 ENCOUNTER — Encounter: Payer: Self-pay | Admitting: Physician Assistant

## 2023-01-18 ENCOUNTER — Ambulatory Visit: Payer: 59 | Attending: Physician Assistant | Admitting: Physician Assistant

## 2023-01-18 VITALS — BP 121/87 | HR 124 | Wt 225.4 lb

## 2023-01-18 DIAGNOSIS — Z7984 Long term (current) use of oral hypoglycemic drugs: Secondary | ICD-10-CM | POA: Diagnosis not present

## 2023-01-18 DIAGNOSIS — E1165 Type 2 diabetes mellitus with hyperglycemia: Secondary | ICD-10-CM | POA: Diagnosis not present

## 2023-01-18 DIAGNOSIS — I10 Essential (primary) hypertension: Secondary | ICD-10-CM | POA: Diagnosis not present

## 2023-01-18 DIAGNOSIS — E785 Hyperlipidemia, unspecified: Secondary | ICD-10-CM | POA: Diagnosis not present

## 2023-01-18 DIAGNOSIS — D649 Anemia, unspecified: Secondary | ICD-10-CM

## 2023-01-18 LAB — POCT GLYCOSYLATED HEMOGLOBIN (HGB A1C): HbA1c, POC (controlled diabetic range): 7.4 % — AB (ref 0.0–7.0)

## 2023-01-18 LAB — GLUCOSE, POCT (MANUAL RESULT ENTRY): POC Glucose: 128 mg/dL — AB (ref 70–99)

## 2023-01-18 MED ORDER — IRON (FERROUS SULFATE) 325 (65 FE) MG PO TABS
1.0000 | ORAL_TABLET | Freq: Every day | ORAL | 3 refills | Status: DC
Start: 2023-01-18 — End: 2023-07-10

## 2023-01-18 MED ORDER — LOSARTAN POTASSIUM 25 MG PO TABS
25.0000 mg | ORAL_TABLET | Freq: Every day | ORAL | 1 refills | Status: DC
Start: 2023-01-18 — End: 2023-08-08

## 2023-01-18 MED ORDER — ATORVASTATIN CALCIUM 20 MG PO TABS
20.0000 mg | ORAL_TABLET | Freq: Every day | ORAL | 1 refills | Status: DC
Start: 2023-01-18 — End: 2023-08-08

## 2023-01-18 MED ORDER — METFORMIN HCL 500 MG PO TABS
750.0000 mg | ORAL_TABLET | Freq: Two times a day (BID) | ORAL | 1 refills | Status: DC
Start: 2023-01-18 — End: 2023-08-08

## 2023-01-18 NOTE — Progress Notes (Signed)
[name], female   DOB: Jul 06, 1975, 47 y.o.   MRN: [id number]   [name], is a 47 y.o. female  [id number]  [id number]  DOB - 14-Feb-1976  Chief Complaint  Patient presents with   Diabetes       Subjective:   [name] is a 47 y.o. female here today for med RF.  No new issues or concerns.  She admits to eating a lot of sugar and carbs lately.  She is trying to get back on track.  She does not want to do labs at this visit.  She is doing well overall.  Denies SI/HI and has BH appts regularly.    No problems updated.  ALLERGIES: Allergies  Allergen Reactions   Geodon [Ziprasidone] Anaphylaxis and Other (See Comments)    EPS symptoms   Haldol [Haloperidol] Other (See Comments)    Lock-jaw, EPS symptoms   Risperidone And Related Other (See Comments)    EPS symptoms   Valproic Acid Other (See Comments)    Reaction unknown    PAST MEDICAL HISTORY: Past Medical History:  Diagnosis Date   Anxiety    Bipolar affective (HCC)    OCD (obsessive compulsive disorder)    Schizoaffective disorder (HCC) 2000    MEDICATIONS AT HOME: Prior to Admission medications   Medication Sig Start Date End Date Taking? Authorizing Provider  Accu-Chek FastClix Lancets MISC Use as instructed. Check blood glucose level by fingerstick twice per day. 08/08/22  Yes Claiborne Rigg, NP  ARIPiprazole ER (ABILIFY MAINTENA) 400 MG SRER injection Inject 2 mLs (400 mg total) into the muscle every 28 (twenty-eight) days. Due 8/25 10/29/18  Yes Malvin Johns, MD  benztropine (COGENTIN) 1 MG tablet Take 1 mg by mouth 2 (two) times daily.   Yes [provider]  Blood Glucose Monitoring Suppl (ACCU-CHEK GUIDE ME) w/Device KIT Use to check blood sugar twice per day 08/08/22  Yes Claiborne Rigg, NP  glucose blood (ACCU-CHEK GUIDE) test strip Use as instructed. Check blood glucose level by fingerstick twice per day. 08/08/22  Yes Claiborne Rigg, NP  hydrOXYzine (ATARAX) 50 MG  tablet Take 50 mg by mouth 3 (three) times daily as needed.   Yes [provider]  Lancets Misc. (ACCU-CHEK FASTCLIX LANCET) KIT Use as instructed. Check blood glucose level by fingerstick twice per day. 08/08/22  Yes Claiborne Rigg, NP  LORazepam (ATIVAN) 1 MG tablet lorazepam 1 mg tablet   Yes [provider]  atorvastatin (LIPITOR) 20 MG tablet Take 1 tablet (20 mg total) by mouth daily. 01/18/23   Anders Simmonds, PA-C  Iron, Ferrous Sulfate, 325 (65 Fe) MG TABS Take 1 tablet by mouth daily. 01/18/23   Anders Simmonds, PA-C  losartan (COZAAR) 25 MG tablet Take 1 tablet (25 mg total) by mouth daily. 01/18/23   Anders Simmonds, PA-C  metFORMIN (GLUCOPHAGE) 500 MG tablet Take 1.5 tablets (750 mg total) by mouth 2 (two) times daily with a meal. 01/18/23   Christabell Loseke, Marzella Schlein, PA-C    ROS: Neg HEENT Neg resp Neg cardiac Neg GI Neg GU Neg MS Neg psych Neg neuro  Objective:   Vitals:   01/18/23 1455  BP: 121/87  Pulse: (!) 124  SpO2: 98%  Weight: 225 lb 6.4 oz (102.2 kg)   Exam General appearance : Awake, alert, not in any distress. Speech Clear. Not toxic looking HEENT: Atraumatic and Normocephalic Neck: Supple, no JVD. No cervical lymphadenopathy.  Chest: Good  air entry bilaterally, CTAB.  No rales/rhonchi/wheezing CVS: S1 S2 regular, no murmurs. Rate at 92 on exam Extremities: B/L Lower Ext shows no edema, both legs are warm to touch Neurology: Awake alert, and oriented X 3, CN II-XII intact, Non focal Skin: No Rash  Data Review Lab Results  Component Value Date   HGBA1C 7.4 (A) 01/18/2023   HGBA1C 7.4 (H) 08/08/2022   HGBA1C 6.1 03/14/2022    Assessment & Plan   1. Type 2 diabetes mellitus with hyperglycemia, unspecified whether long term insulin use (HCC) Uncontrolled-increase dose metformin and work at a goal of eliminating sugary drinks, candy, desserts, sweets, refined sugars, processed foods, and white carbohydrates.   - Glucose (CBG) -  HgB A1c - metFORMIN (GLUCOPHAGE) 500 MG tablet; Take 1.5 tablets (750 mg total) by mouth 2 (two) times daily with a meal.  Dispense: 270 tablet; Refill: 1 - losartan (COZAAR) 25 MG tablet; Take 1 tablet (25 mg total) by mouth daily.  Dispense: 90 tablet; Refill: 1  2. Long term current use of oral hypoglycemic drug - Glucose (CBG) - HgB A1c - metFORMIN (GLUCOPHAGE) 500 MG tablet; Take 1.5 tablets (750 mg total) by mouth 2 (two) times daily with a meal.  Dispense: 270 tablet; Refill: 1  3. Dyslipidemia, goal LDL below 70 - atorvastatin (LIPITOR) 20 MG tablet; Take 1 tablet (20 mg total) by mouth daily.  Dispense: 90 tablet; Refill: 1  4. Hypertension, unspecified type - losartan (COZAAR) 25 MG tablet; Take 1 tablet (25 mg total) by mouth daily.  Dispense: 90 tablet; Refill: 1  5. Anemia, unspecified type stable - Iron, Ferrous Sulfate, 325 (65 Fe) MG TABS; Take 1 tablet by mouth daily.  Dispense: 90 tablet; Refill: 3    Return in about 4 months (around 05/18/2023) for PCP for chronic conditions-[facility].  The patient was given clear instructions to go to ER or return to medical center if symptoms don't improve, worsen or new problems develop. The patient verbalized understanding. The patient was told to call to get lab results if they haven't heard anything in the next week.      Georgian Co, PA-C [facility] [place], [place] [phone]   01/18/2023, 3:17 PM

## 2023-01-18 NOTE — Patient Instructions (Signed)
Work at a goal of eliminating sugary drinks, candy, desserts, sweets, refined sugars, processed foods, and white carbohydrates.    Drink 64 to 80 ounces water daily

## 2023-05-10 ENCOUNTER — Ambulatory Visit: Payer: 59 | Admitting: Nurse Practitioner

## 2023-06-02 ENCOUNTER — Encounter: Payer: Self-pay | Admitting: Nurse Practitioner

## 2023-06-02 ENCOUNTER — Ambulatory Visit: Attending: Nurse Practitioner | Admitting: Nurse Practitioner

## 2023-06-02 VITALS — BP 111/74 | HR 93 | Resp 19 | Ht 63.0 in | Wt 220.0 lb

## 2023-06-02 DIAGNOSIS — Z7984 Long term (current) use of oral hypoglycemic drugs: Secondary | ICD-10-CM

## 2023-06-02 DIAGNOSIS — E1165 Type 2 diabetes mellitus with hyperglycemia: Secondary | ICD-10-CM | POA: Diagnosis not present

## 2023-06-02 DIAGNOSIS — Z1231 Encounter for screening mammogram for malignant neoplasm of breast: Secondary | ICD-10-CM

## 2023-06-02 DIAGNOSIS — E782 Mixed hyperlipidemia: Secondary | ICD-10-CM | POA: Diagnosis not present

## 2023-06-02 DIAGNOSIS — Z1211 Encounter for screening for malignant neoplasm of colon: Secondary | ICD-10-CM

## 2023-06-02 DIAGNOSIS — D649 Anemia, unspecified: Secondary | ICD-10-CM | POA: Diagnosis not present

## 2023-06-02 LAB — POCT GLYCOSYLATED HEMOGLOBIN (HGB A1C): HbA1c, POC (controlled diabetic range): 6.7 % (ref 0.0–7.0)

## 2023-06-02 NOTE — Progress Notes (Signed)
 Assessment & Plan:  [name] was seen today for medical management of chronic issues.  Diagnoses and all orders for this visit:  Type 2 diabetes mellitus with hyperglycemia, unspecified whether long term insulin use (HCC) -     POCT glycosylated hemoglobin (Hb A1C) -     CMP14+EGFR Continue blood sugar control as discussed in office today, low carbohydrate diet, and regular physical exercise as tolerated, 150 minutes per week (30 min each day, 5 days per week, or 50 min 3 days per week). Keep blood sugar logs with fasting goal of 90-130 mg/dl, post prandial (after you eat) less than 180.  For Hypoglycemia: BS <60 and Hyperglycemia BS >400; contact the clinic ASAP. Annual eye exams and foot exams are recommended.   Colon cancer screening -     Ambulatory referral to Gastroenterology  Breast cancer screening by mammogram -     MM 3D DIAGNOSTIC MAMMOGRAM BILATERAL BREAST; Future  Mixed hyperlipidemia -     Lipid panel  Anemia, unspecified type -     CBC with Differential -     Iron, TIBC and Ferritin Panel    Patient has been counseled on age-appropriate routine health concerns for screening and prevention. These are reviewed and up-to-date. Referrals have been placed accordingly. Immunizations are up-to-date or declined.    Subjective:   Chief Complaint  Patient presents with   Medical Management of Chronic Issues    [name] 48 y.o. female presents to office today for follow-up to diabetes.  She has a history of prediabetes, dyslipidemia and bipolar disorder, OCD and schizoaffective disorder.  She is currently followed by a mental health action team which helps with her compliance with medications.  She denies any acute issues at today's visit. She receives her medications in a pill pack from the [employer] .    DM 2 She is currently taking metformin 750 mg twice daily. A1c at goal of less than 7.  She has made significant changes in her diet including  decreasing her carbs and sugar intake and implementing more protein.  She plans to join the [facility] with a free membership offered through her insurance. Lab Results  Component Value Date   HGBA1C 6.7 06/02/2023     Review of Systems  Constitutional:  Negative for fever, malaise/fatigue and weight loss.  HENT: Negative.  Negative for nosebleeds.   Eyes: Negative.  Negative for blurred vision, double vision and photophobia.  Respiratory: Negative.  Negative for cough and shortness of breath.   Cardiovascular: Negative.  Negative for chest pain, palpitations and leg swelling.  Gastrointestinal: Negative.  Negative for heartburn, nausea and vomiting.  Musculoskeletal: Negative.  Negative for myalgias.  Neurological: Negative.  Negative for dizziness, focal weakness, seizures and headaches.  Psychiatric/Behavioral: Negative.  Negative for suicidal ideas.     Past Medical History:  Diagnosis Date   Anxiety    Bipolar affective (HCC)    OCD (obsessive compulsive disorder)    Schizoaffective disorder (HCC) 2000    Past Surgical History:  Procedure Laterality Date   no past surgery      Family History  Problem Relation Age of Onset   Hyperthyroidism Mother    Diabetes Mother    Drug abuse Father    Heart attack Father    Mental illness Other    Breast cancer Neg Hx     Social History Reviewed with no changes to be made today.   Outpatient Medications Prior to Visit  Medication  Sig Dispense Refill   Accu-Chek FastClix Lancets MISC Use as instructed. Check blood glucose level by fingerstick twice per day. 100 each 3   ARIPiprazole ER (ABILIFY MAINTENA) 400 MG SRER injection Inject 2 mLs (400 mg total) into the muscle every 28 (twenty-eight) days. Due 8/25 1 each 11   atorvastatin (LIPITOR) 20 MG tablet Take 1 tablet (20 mg total) by mouth daily. 90 tablet 1   benztropine (COGENTIN) 1 MG tablet Take 1 mg by mouth 2 (two) times daily.     Blood Glucose Monitoring Suppl (ACCU-CHEK  GUIDE ME) w/Device KIT Use to check blood sugar twice per day 1 kit 0   glucose blood (ACCU-CHEK GUIDE) test strip Use as instructed. Check blood glucose level by fingerstick twice per day. 100 each 12   hydrOXYzine (ATARAX) 50 MG tablet Take 50 mg by mouth 3 (three) times daily as needed.     Iron, Ferrous Sulfate, 325 (65 Fe) MG TABS Take 1 tablet by mouth daily. 90 tablet 3   Lancets Misc. (ACCU-CHEK FASTCLIX LANCET) KIT Use as instructed. Check blood glucose level by fingerstick twice per day. 1 kit 11   LORazepam (ATIVAN) 1 MG tablet lorazepam 1 mg tablet     losartan (COZAAR) 25 MG tablet Take 1 tablet (25 mg total) by mouth daily. 90 tablet 1   metFORMIN (GLUCOPHAGE) 500 MG tablet Take 1.5 tablets (750 mg total) by mouth 2 (two) times daily with a meal. 270 tablet 1   No facility-administered medications prior to visit.    Allergies  Allergen Reactions   Geodon [Ziprasidone] Anaphylaxis and Other (See Comments)    EPS symptoms   Haldol [Haloperidol] Other (See Comments)    Lock-jaw, EPS symptoms   Risperidone And Related Other (See Comments)    EPS symptoms   Valproic Acid Other (See Comments)    Reaction unknown       Objective:    BP 111/74 (BP Location: Left Arm, Patient Position: Sitting, Cuff Size: Normal)   Pulse 93   Resp 19   Ht 5\' 3"  (1.6 m)   Wt 220 lb (99.8 kg)   LMP 04/09/2023 (Exact Date)   SpO2 100%   BMI 38.97 kg/m  Wt Readings from Last 3 Encounters:  06/02/23 220 lb (99.8 kg)  01/18/23 225 lb 6.4 oz (102.2 kg)  08/08/22 216 lb 6.4 oz (98.2 kg)    Physical Exam Vitals and nursing note reviewed.  Constitutional:      Appearance: She is well-developed.  HENT:     Head: Normocephalic and atraumatic.  Cardiovascular:     Rate and Rhythm: Normal rate and regular rhythm.     Heart sounds: Normal heart sounds. No murmur heard.    No friction rub. No gallop.  Pulmonary:     Effort: Pulmonary effort is normal. No tachypnea or respiratory distress.      Breath sounds: Normal breath sounds. No decreased breath sounds, wheezing, rhonchi or rales.  Chest:     Chest wall: No tenderness.  Abdominal:     General: Bowel sounds are normal.     Palpations: Abdomen is soft.  Musculoskeletal:        General: Normal range of motion.     Cervical back: Normal range of motion.  Skin:    General: Skin is warm and dry.  Neurological:     Mental Status: She is alert and oriented to person, place, and time.     Coordination: Coordination normal.  Psychiatric:  Behavior: Behavior normal. Behavior is cooperative.        Thought Content: Thought content normal.        Judgment: Judgment normal.          Patient has been counseled extensively about nutrition and exercise as well as the importance of adherence with medications and regular follow-up. The patient was given clear instructions to go to ER or return to medical center if symptoms don't improve, worsen or new problems develop. The patient verbalized understanding.   Follow-up: Return in about 6 months (around 12/03/2023).   Claiborne Rigg, FNP-BC [facility] and [facility] [place], [place] [phone]   06/02/2023, 12:06 PM

## 2023-06-02 NOTE — Patient Instructions (Signed)
 [facility] Located in: [facility] Address: [address], [place], [place] [address] Phone: [phone]

## 2023-06-03 LAB — CBC WITH DIFFERENTIAL/PLATELET
Basophils Absolute: 0 10*3/uL (ref 0.0–0.2)
Basos: 0 %
EOS (ABSOLUTE): 0.1 10*3/uL (ref 0.0–0.4)
Eos: 1 %
Hematocrit: 34.9 % (ref 34.0–46.6)
Hemoglobin: 11.2 g/dL (ref 11.1–15.9)
Immature Grans (Abs): 0 10*3/uL (ref 0.0–0.1)
Immature Granulocytes: 1 %
Lymphocytes Absolute: 2.1 10*3/uL (ref 0.7–3.1)
Lymphs: 25 %
MCH: 27.4 pg (ref 26.6–33.0)
MCHC: 32.1 g/dL (ref 31.5–35.7)
MCV: 85 fL (ref 79–97)
Monocytes Absolute: 0.6 10*3/uL (ref 0.1–0.9)
Monocytes: 7 %
Neutrophils Absolute: 5.5 10*3/uL (ref 1.4–7.0)
Neutrophils: 66 %
Platelets: 333 10*3/uL (ref 150–450)
RBC: 4.09 x10E6/uL (ref 3.77–5.28)
RDW: 15.2 % (ref 11.7–15.4)
WBC: 8.3 10*3/uL (ref 3.4–10.8)

## 2023-06-03 LAB — CMP14+EGFR
ALT: 17 IU/L (ref 0–32)
AST: 19 IU/L (ref 0–40)
Albumin: 4.4 g/dL (ref 3.9–4.9)
Alkaline Phosphatase: 67 IU/L (ref 44–121)
BUN/Creatinine Ratio: 17 (ref 9–23)
BUN: 18 mg/dL (ref 6–24)
Bilirubin Total: 0.3 mg/dL (ref 0.0–1.2)
CO2: 17 mmol/L — ABNORMAL LOW (ref 20–29)
Calcium: 9.5 mg/dL (ref 8.7–10.2)
Chloride: 104 mmol/L (ref 96–106)
Creatinine, Ser: 1.06 mg/dL — ABNORMAL HIGH (ref 0.57–1.00)
Globulin, Total: 3.6 g/dL (ref 1.5–4.5)
Glucose: 127 mg/dL — ABNORMAL HIGH (ref 70–99)
Potassium: 4.6 mmol/L (ref 3.5–5.2)
Sodium: 138 mmol/L (ref 134–144)
Total Protein: 8 g/dL (ref 6.0–8.5)
eGFR: 66 mL/min/{1.73_m2} (ref >59)

## 2023-06-03 LAB — IRON,TIBC AND FERRITIN PANEL
Ferritin: 40 ng/mL (ref 15–150)
Iron Saturation: 8 % — CL (ref 15–55)
Iron: 28 ug/dL (ref 27–159)
Total Iron Binding Capacity: 348 ug/dL (ref 250–450)
UIBC: 320 ug/dL (ref 131–425)

## 2023-06-03 LAB — LIPID PANEL
Chol/HDL Ratio: 3.5 ratio (ref 0.0–4.4)
Cholesterol, Total: 107 mg/dL (ref 100–199)
HDL: 31 mg/dL — ABNORMAL LOW (ref >39)
LDL Chol Calc (NIH): 60 mg/dL (ref 0–99)
Triglycerides: 78 mg/dL (ref 0–149)
VLDL Cholesterol Cal: 16 mg/dL (ref 5–40)

## 2023-06-07 ENCOUNTER — Encounter: Payer: Self-pay | Admitting: Nurse Practitioner

## 2023-06-14 ENCOUNTER — Encounter: Payer: Self-pay | Admitting: Internal Medicine

## 2023-07-10 ENCOUNTER — Other Ambulatory Visit: Payer: Self-pay | Admitting: Nurse Practitioner

## 2023-07-10 DIAGNOSIS — D649 Anemia, unspecified: Secondary | ICD-10-CM

## 2023-07-18 ENCOUNTER — Ambulatory Visit (AMBULATORY_SURGERY_CENTER): Admitting: *Deleted

## 2023-07-18 ENCOUNTER — Telehealth: Payer: Self-pay | Admitting: *Deleted

## 2023-07-18 ENCOUNTER — Encounter: Payer: Self-pay | Admitting: Internal Medicine

## 2023-07-18 VITALS — Ht 63.0 in | Wt 207.0 lb

## 2023-07-18 DIAGNOSIS — Z1211 Encounter for screening for malignant neoplasm of colon: Secondary | ICD-10-CM

## 2023-07-18 MED ORDER — NA SULFATE-K SULFATE-MG SULF 17.5-3.13-1.6 GM/177ML PO SOLN: 1.0000 | Freq: Once | ORAL | 0 refills | Status: AC

## 2023-07-18 NOTE — Telephone Encounter (Signed)
 Attempt to reach pt for pre-visit. Lbusy signal   Will attempt to reach again in 5 min due to no other # listed in profile  Second attempt reached pt

## 2023-07-18 NOTE — Progress Notes (Signed)
 Pt's name and DOB verified at the beginning of the pre-visit wit 2 identifiers   Pt denies any difficulty with ambulating,sitting, laying down or rolling side to side  Pt has no issues moving head neck or swallowing   Patient denies ever being intubated  No FH of Malignant Hyperthermia  Pt is not on home 02   Pt is not on blood thinners   Pt denies issues with constipation   Pt is not on dialysis  Pt denise any abnormal heart rhythms   Pt denies any upcoming cardiac testing  Patient's chart reviewed by Rogena Class CNRA prior to pre-visit and patient appropriate for the [facility].  Pre-visit completed and red dot placed by patient's name on their procedure day (on provider's schedule).     Visit by phone  Pt states weight is 207 lb   IInstructions reviewed. Pt given  both [facility] main # and MD on call # prior to instructions.  Pt states understanding of instructions. Instructed pt to review instructions again prior to procedure and call main # given if has questions.. Pt states they will.   Instructed pt on where to find instructions on My Chart.

## 2023-08-01 ENCOUNTER — Encounter: Admitting: Internal Medicine

## 2023-08-01 ENCOUNTER — Telehealth: Payer: Self-pay | Admitting: Internal Medicine

## 2023-08-01 NOTE — Telephone Encounter (Signed)
 PT has called and cancelled colonoscopy for today at 3pm with Dr. Rosaline Coma

## 2023-08-07 ENCOUNTER — Other Ambulatory Visit: Payer: Self-pay | Admitting: Physician Assistant

## 2023-08-07 ENCOUNTER — Encounter: Payer: Self-pay | Admitting: Internal Medicine

## 2023-08-07 DIAGNOSIS — I10 Essential (primary) hypertension: Secondary | ICD-10-CM

## 2023-08-07 DIAGNOSIS — Z7984 Long term (current) use of oral hypoglycemic drugs: Secondary | ICD-10-CM

## 2023-08-07 DIAGNOSIS — E785 Hyperlipidemia, unspecified: Secondary | ICD-10-CM

## 2023-08-07 DIAGNOSIS — E1165 Type 2 diabetes mellitus with hyperglycemia: Secondary | ICD-10-CM

## 2023-08-08 NOTE — Telephone Encounter (Signed)
 Requested Prescriptions  Pending Prescriptions Disp Refills   atorvastatin  (LIPITOR) 20 MG tablet [Pharmacy Med Name: ATORVASTATIN  CALCIUM  20MG  TAB] 90 tablet 0    Sig: TAKE ONE TABLET BY MOUTH DAILY     Cardiovascular:  Antilipid - Statins Failed - 08/08/2023  2:49 PM      Failed - Lipid Panel in normal range within the last 12 months    Cholesterol, Total  Date Value Ref Range Status  06/02/2023 107 100 - 199 mg/dL Final   LDL Chol Calc (NIH)  Date Value Ref Range Status  06/02/2023 60 0 - 99 mg/dL Final   HDL  Date Value Ref Range Status  06/02/2023 31 (L) >39 mg/dL Final   Triglycerides  Date Value Ref Range Status  06/02/2023 78 0 - 149 mg/dL Final         Passed - Patient is not pregnant      Passed - Valid encounter within last 12 months    Recent Outpatient Visits           2 months ago Type 2 diabetes mellitus with hyperglycemia, unspecified whether long term insulin use (HCC)   [facility] Dept Of [place]. [facility] [place], [place] W, NP   6 months ago Type 2 diabetes mellitus with hyperglycemia, unspecified whether long term insulin use (HCC)   [facility] - A Dept Of [place]. [facility] [place], [place], [place]   1 year ago Type 2 diabetes mellitus with hyperglycemia, unspecified whether long term insulin use (HCC)   [facility] - A Dept Of [place]. [facility] [place], [place] W, NP   1 year ago Type 2 diabetes mellitus with hyperglycemia, unspecified whether long term insulin use (HCC)   [facility] [facility] - A Dept Of [place]. [facility] [place], [place], [place]   1 year ago Encounter for Papanicolaou smear for cervical cancer screening   [facility] [place] - A Dept Of [place]. [facility] [place], [place], NP               metFORMIN  (GLUCOPHAGE ) 500 MG tablet [Pharmacy Med Name: metFORMIN  HCL 500MG  TAB] 270 tablet  4    Sig: TAKE 1.5 TABLETS (750 MG TOTAL) BY MOUTH TWO (TWO) TIMES DAILY WITH A MEAL     Endocrinology:  Diabetes - Biguanides Failed - 08/08/2023  2:49 PM      Failed - Cr in normal range and within 360 days    Creatinine, Ser  Date Value Ref Range Status  06/02/2023 1.06 (H) 0.57 - 1.00 mg/dL Final   Creatinine,U  Date Value Ref Range Status  02/27/2007   Final   65.6 (NOTE)  Cutoff Values for Urine Drug Screen:        Drug Class           Cutoff (ng/mL)        Amphetamines            1000        Barbiturates             200        Cocaine Metabolites      300        Benzodiazepines          200        Methadone  300        Opiates                 2000        Phencyclidine             25        Propoxyphene             300        Marijuana Metabolites     50  For medical purposes only.         Failed - B12 Level in normal range and within 720 days    No results found for: "VITAMINB12"       Failed - Valid encounter within last 6 months    Recent Outpatient Visits           2 months ago Type 2 diabetes mellitus with hyperglycemia, unspecified whether long term insulin use (HCC)   [facility] [facility] - A Dept Of [place]. [facility] [place], [place] W, NP   6 months ago Type 2 diabetes mellitus with hyperglycemia, unspecified whether long term insulin use (HCC)   [facility] [facility] - A Dept Of [place]. [facility] [place] [facility], [place], [place]   1 year ago Type 2 diabetes mellitus with hyperglycemia, unspecified whether long term insulin use (HCC)   [facility] [facility] - A Dept Of [place]. [facility] [place], [place] W, NP   1 year ago Type 2 diabetes mellitus with hyperglycemia, unspecified whether long term insulin use (HCC)   [place] [place] [facility] - A Dept Of [place]. [facility] [place], [place], [place]   1 year ago Encounter for Papanicolaou smear for cervical cancer screening   [facility] [place] - A Dept Of [place]. [facility] [place], [place] W, NP              Passed - HBA1C is between 0 and 7.9 and within 180 days    HbA1c, POC (controlled diabetic range)  Date Value Ref Range Status  06/02/2023 6.7 0.0 - 7.0 % Final         Passed - eGFR in normal range and within 360 days    GFR calc Af Amer  Date Value Ref Range Status  09/27/2018 58 (L) >60 mL/min Final   GFR calc non Af Amer  Date Value Ref Range Status  09/27/2018 50 (L) >60 mL/min Final   eGFR  Date Value Ref Range Status  06/02/2023 66 >59 mL/min/1.73 Final         Passed - CBC within normal limits and completed in the last 12 months    WBC  Date Value Ref Range Status  06/02/2023 8.3 3.4 - 10.8 x10E3/uL Final  09/27/2018 7.8 4.0 - 10.5 K/uL Final   RBC  Date Value Ref Range Status  06/02/2023 4.09 3.77 - 5.28 x10E6/uL Final  09/27/2018 4.47 3.87 - 5.11 MIL/uL Final   Hemoglobin  Date Value Ref Range Status  06/02/2023 11.2 11.1 - 15.9 g/dL Final   Hematocrit  Date Value Ref Range Status  06/02/2023 34.9 34.0 - 46.6 % Final   MCHC  Date Value Ref Range Status  06/02/2023 32.1 31.5 - 35.7 g/dL Final  16/12/9602 54.0 30.0 - 36.0 g/dL Final   [facility]  Date Value Ref Range Status  06/02/2023 27.4 26.6 - 33.0 pg Final  09/27/2018 27.3 26.0 -  34.0 pg Final   MCV  Date Value Ref Range Status  06/02/2023 85 79 - 97 fL Final   No results found for: "PLTCOUNTKUC", "LABPLAT", "POCPLA" RDW  Date Value Ref Range Status  06/02/2023 15.2 11.7 - 15.4 % Final          losartan  (COZAAR ) 25 MG tablet [Pharmacy Med Name: LOSARTAN  POTASSIUM 25MG  TAB] 90 tablet 0    Sig: TAKE ONE TABLET BY MOUTH DAILY     Cardiovascular:  Angiotensin Receptor Blockers Failed - 08/08/2023  2:49 PM      Failed - Cr in normal range and within 180 days    Creatinine, Ser  Date Value Ref Range Status  06/02/2023 1.06 (H) 0.57 - 1.00 mg/dL Final   Creatinine,U  Date Value Ref Range  Status  02/27/2007   Final   65.6 (NOTE)  Cutoff Values for Urine Drug Screen:        Drug Class           Cutoff (ng/mL)        Amphetamines            1000        Barbiturates             200        Cocaine Metabolites      300        Benzodiazepines          200        Methadone                 300        Opiates                 2000        Phencyclidine             25        Propoxyphene             300        Marijuana Metabolites     50  For medical purposes only.         Failed - Valid encounter within last 6 months    Recent Outpatient Visits           2 months ago Type 2 diabetes mellitus with hyperglycemia, unspecified whether long term insulin use (HCC)   [facility] A Dept Of [place]. [facility] [place], [place] W, NP   6 months ago Type 2 diabetes mellitus with hyperglycemia, unspecified whether long term insulin use (HCC)   [facility] - A Dept Of [place]. [facility] [place], [place], [place]   1 year ago Type 2 diabetes mellitus with hyperglycemia, unspecified whether long term insulin use (HCC)   [facility] - A Dept Of [place]. [facility] [place], [place] W, NP   1 year ago Type 2 diabetes mellitus with hyperglycemia, unspecified whether long term insulin use (HCC)   [place] [facility] - A Dept Of [place]. [facility] [place], [place], [place]   1 year ago Encounter for Papanicolaou smear for cervical cancer screening   [place] [facility] - A Dept Of [place]. [facility] [place], [place] W, NP              Passed - K in normal range and within 180 days  Potassium  Date Value Ref Range Status  06/02/2023 4.6 3.5 - 5.2 mmol/L Final         Passed - Patient is not pregnant      Passed - Last BP in normal range    BP Readings from Last 1 Encounters:  06/02/23 111/74

## 2023-09-01 ENCOUNTER — Encounter: Payer: Self-pay | Admitting: Internal Medicine

## 2023-09-01 ENCOUNTER — Ambulatory Visit (AMBULATORY_SURGERY_CENTER): Admitting: *Deleted

## 2023-09-01 VITALS — Ht 63.0 in | Wt 215.0 lb

## 2023-09-01 DIAGNOSIS — Z1211 Encounter for screening for malignant neoplasm of colon: Secondary | ICD-10-CM

## 2023-09-01 MED ORDER — NA SULFATE-K SULFATE-MG SULF 17.5-3.13-1.6 GM/177ML PO SOLN
1.0000 | Freq: Once | ORAL | 0 refills | Status: AC
Start: 2023-09-01 — End: 2023-09-01

## 2023-09-01 NOTE — Progress Notes (Signed)
 Pt's name and DOB verified at the beginning of the pre-visit wit 2 identifiers   Pt denies any difficulty with ambulating,sitting, laying down or rolling side to side  Pt has no issues moving head neck or swallowing  No egg or soy allergy known to patient   Patient denies ever being intubated  No FH of Malignant Hyperthermia  Pt is not on home 02   Pt is not on blood thinners   Pt denies issues with constipation   Pt is not on dialysis  Pt denise any abnormal heart rhythms   Pt denies any upcoming cardiac testing   Chart not reviewed by CRNA prior to PV  Visit by phone  Pt states weight is 215 lb  IInstructions reviewed. Pt given  both [facility] main # and MD on call # prior to instructions.  Pt states understanding of instructions. Instructed pt to review instructions again prior to procedure and call main # given if has questions.. Pt states they will.   Instructed pt on where to find instructions on My Chart.

## 2023-09-12 ENCOUNTER — Ambulatory Visit: Admitting: Internal Medicine

## 2023-09-12 ENCOUNTER — Encounter: Payer: Self-pay | Admitting: Internal Medicine

## 2023-09-12 VITALS — BP 114/74 | HR 89 | Temp 97.2°F | Resp 14 | Ht 63.0 in | Wt 215.0 lb

## 2023-09-12 DIAGNOSIS — K5730 Diverticulosis of large intestine without perforation or abscess without bleeding: Secondary | ICD-10-CM

## 2023-09-12 DIAGNOSIS — K529 Noninfective gastroenteritis and colitis, unspecified: Secondary | ICD-10-CM

## 2023-09-12 DIAGNOSIS — K648 Other hemorrhoids: Secondary | ICD-10-CM

## 2023-09-12 DIAGNOSIS — Z1211 Encounter for screening for malignant neoplasm of colon: Secondary | ICD-10-CM | POA: Diagnosis not present

## 2023-09-12 MED ORDER — SODIUM CHLORIDE 0.9 % IV SOLN: 500.0000 mL | Freq: Once | INTRAVENOUS | Status: DC

## 2023-09-12 NOTE — Progress Notes (Signed)
 Called to room to assist during endoscopic procedure.  Patient ID and intended procedure confirmed with present staff. Received instructions for my participation in the procedure from the performing physician.

## 2023-09-12 NOTE — Progress Notes (Signed)
 GASTROENTEROLOGY PROCEDURE H&P NOTE   Primary Care Physician: Theotis Haze ORN, NP    Reason for Procedure:   Colon cancer screening  Plan:    Colonoscopy  Patient is appropriate for endoscopic procedure(s) in the ambulatory ([facility]) setting.  The nature of the procedure, as well as the risks, benefits, and alternatives were carefully and thoroughly reviewed with the patient. Ample time for discussion and questions allowed. The patient understood, was satisfied, and agreed to proceed.     HPI: [name] is a 48 y.o. female who presents for colonoscopy for colon cancer screening. Denies blood in stools, changes in bowel habits, or unintentional weight loss. Denies family history of colon cancer.  Past Medical History:  Diagnosis Date   Anxiety    Bipolar affective (HCC)    Depression    Diabetes mellitus without complication (HCC)    Hyperlipidemia    Hypertension    OCD (obsessive compulsive disorder)    Schizoaffective disorder (HCC) 2000    Past Surgical History:  Procedure Laterality Date   no past surgery      Prior to Admission medications   Medication Sig Start Date End Date Taking? Authorizing Provider  Accu-Chek FastClix Lancets MISC Use as instructed. Check blood glucose level by fingerstick twice per day. 08/08/22   Fleming, Zelda W, NP  ARIPiprazole  ER (ABILIFY  MAINTENA) 400 MG SRER injection Inject 2 mLs (400 mg total) into the muscle every 28 (twenty-eight) days. Due 8/25 10/29/18   Malinda Rogue, MD  atorvastatin  (LIPITOR) 20 MG tablet TAKE ONE TABLET BY MOUTH DAILY 08/08/23   Fleming, Zelda W, NP  benztropine  (COGENTIN ) 1 MG tablet Take 1 mg by mouth 2 (two) times daily.    [provider]  Blood Glucose Monitoring Suppl (ACCU-CHEK GUIDE ME) w/Device KIT Use to check blood sugar twice per day 08/08/22   Fleming, Zelda W, NP  FEROSUL 325 (65 Fe) MG tablet TAKE ONE TABLET BY MOUTH DAILY 07/10/23   Fleming, Zelda W, NP  glucose blood (ACCU-CHEK GUIDE)  test strip Use as instructed. Check blood glucose level by fingerstick twice per day. 08/08/22   Fleming, Zelda W, NP  hydrOXYzine  (ATARAX ) 50 MG tablet Take 50 mg by mouth 3 (three) times daily as needed.    [provider]  hydrOXYzine  (VISTARIL ) 50 MG capsule Take 50 mg by mouth 3 (three) times daily. 06/30/23   [provider]  Lancets Misc. (ACCU-CHEK FASTCLIX LANCET) KIT Use as instructed. Check blood glucose level by fingerstick twice per day. 08/08/22   Fleming, Zelda W, NP  LORazepam  (ATIVAN ) 1 MG tablet lorazepam  1 mg tablet    [provider]  losartan  (COZAAR ) 25 MG tablet TAKE ONE TABLET BY MOUTH DAILY 08/08/23   Fleming, Zelda W, NP  metFORMIN  (GLUCOPHAGE ) 500 MG tablet TAKE 1.5 TABLETS (750 MG TOTAL) BY MOUTH TWO (TWO) TIMES DAILY WITH A MEAL 08/08/23   Fleming, Zelda W, NP    Current Outpatient Medications  Medication Sig Dispense Refill   atorvastatin  (LIPITOR) 20 MG tablet TAKE ONE TABLET BY MOUTH DAILY 90 tablet 0   benztropine  (COGENTIN ) 1 MG tablet Take 1 mg by mouth 2 (two) times daily.     hydrOXYzine  (ATARAX ) 50 MG tablet Take 50 mg by mouth 3 (three) times daily as needed.     hydrOXYzine  (VISTARIL ) 50 MG capsule Take 50 mg by mouth 3 (three) times daily.     LORazepam  (ATIVAN ) 1 MG tablet lorazepam  1 mg tablet  losartan  (COZAAR ) 25 MG tablet TAKE ONE TABLET BY MOUTH DAILY 90 tablet 0   metFORMIN  (GLUCOPHAGE ) 500 MG tablet TAKE 1.5 TABLETS (750 MG TOTAL) BY MOUTH TWO (TWO) TIMES DAILY WITH A MEAL 270 tablet 0   Accu-Chek FastClix Lancets MISC Use as instructed. Check blood glucose level by fingerstick twice per day. 100 each 3   ARIPiprazole  ER (ABILIFY  MAINTENA) 400 MG SRER injection Inject 2 mLs (400 mg total) into the muscle every 28 (twenty-eight) days. Due 8/25 1 each 11   Blood Glucose Monitoring Suppl (ACCU-CHEK GUIDE ME) w/Device KIT Use to check blood sugar twice per day 1 kit 0   FEROSUL 325 (65 Fe) MG tablet TAKE ONE TABLET BY MOUTH DAILY  90 tablet 10   glucose blood (ACCU-CHEK GUIDE) test strip Use as instructed. Check blood glucose level by fingerstick twice per day. 100 each 12   Lancets Misc. (ACCU-CHEK FASTCLIX LANCET) KIT Use as instructed. Check blood glucose level by fingerstick twice per day. 1 kit 11   Current Facility-Administered Medications  Medication Dose Route Frequency Provider Last Rate Last Admin   0.9 %  sodium chloride  infusion  500 mL Intravenous Once Federico Rosario BROCKS, MD        Allergies as of 09/12/2023 - Review Complete 09/12/2023  Allergen Reaction Noted   Geodon  [ziprasidone ] Anaphylaxis and Other (See Comments) 07/05/2017   Haldol  [haloperidol ] Other (See Comments) 11/10/2014   Risperidone  and paliperidone  Other (See Comments) 07/05/2017   Valproic acid  Other (See Comments) 07/16/2015    Family History  Problem Relation Age of Onset   Hyperthyroidism Mother    Diabetes Mother    Drug abuse Father    Heart attack Father    Mental illness Other    Breast cancer Neg Hx    Colon cancer Neg Hx    Colon polyps Neg Hx    Esophageal cancer Neg Hx    Rectal cancer Neg Hx    Stomach cancer Neg Hx     Social History   Socioeconomic History   Marital status: Single    Spouse name: Not on file   Number of children: Not on file   Years of education: Not on file   Highest education level: Associate degree: occupational, Scientist, product/process development, or vocational program  Occupational History   Occupation: Disability  Tobacco Use   Smoking status: Former    Current packs/day: 1.00    Average packs/day: 1 pack/day for 1 year (1.0 ttl pk-yrs)    Types: Cigarettes   Smokeless tobacco: Never   Tobacco comments:    once a month   Vaping Use   Vaping status: Never Used  Substance and Sexual Activity   Alcohol use: Yes    Comment: socially   Drug use: Not Currently    Types: Marijuana   Sexual activity: Not Currently    Birth control/protection: None  Other Topics Concern   Not on file  Social History  Narrative   Pt stated that she lives with her husband and two children.  Not verified.  Pt is followed by Dr. Malinda.   Social Drivers of Health   Financial Resource Strain: Medium Risk (01/17/2023)   Overall Financial Resource Strain (CARDIA)    Difficulty of Paying Living Expenses: Somewhat hard  Food Insecurity: No Food Insecurity (01/17/2023)   Hunger Vital Sign    Worried About Running Out of Food in the Last Year: Never true    Ran Out of Food in the Last Year: Never  true  Transportation Needs: No Transportation Needs (01/17/2023)   PRAPARE - Administrator, Civil Service (Medical): No    Lack of Transportation (Non-Medical): No  Physical Activity: Insufficiently Active (01/17/2023)   Exercise Vital Sign    Days of Exercise per Week: 3 days    Minutes of Exercise per Session: 30 min  Stress: No Stress Concern Present (01/17/2023)   [employer] of Occupational Health - Occupational Stress Questionnaire    Feeling of Stress : Only a little  Social Connections: Unknown (01/17/2023)   Social Connection and Isolation Panel    Frequency of Communication with Friends and Family: More than three times a week    Frequency of Social Gatherings with Friends and Family: Once a week    Attends Religious Services: Patient declined    Database administrator or Organizations: No    Attends Engineer, structural: Not on file    Marital Status: Never married  Catering manager Violence: Not on file    Physical Exam: Vital signs in last 24 hours: BP 133/80   Pulse (!) 104   Temp (!) 97.2 F (36.2 C) (Temporal)   Ht 5' 3 (1.6 m)   Wt 215 lb (97.5 kg)   SpO2 98%   BMI 38.09 kg/m  GEN: NAD EYE: Sclerae anicteric ENT: MMM CV: Non-tachycardic Pulm: No increased work of breathing GI: Soft, NT/ND NEURO:  Alert & Oriented   Estefana Kidney, MD [facility]  09/12/2023 1:44 PM

## 2023-09-12 NOTE — Progress Notes (Signed)
 Report given to PACU, vss

## 2023-09-12 NOTE — Progress Notes (Signed)
 BP 174/113, Labetalol given IV, MD update, vss  Patient states she has not taken BP meds for 5 days.

## 2023-09-12 NOTE — Patient Instructions (Addendum)
 Resume previous diet Continue present medications Await pathology results  Handouts/information given for diverticulosis and hemorrhoids  YOU HAD AN ENDOSCOPIC PROCEDURE TODAY AT THE [facility]:   Refer to the procedure report that was given to you for any specific questions about what was found during the examination.  If the procedure report does not answer your questions, please call your gastroenterologist to clarify.  If you requested that your care partner not be given the details of your procedure findings, then the procedure report has been included in a sealed envelope for you to review at your convenience later.  YOU SHOULD EXPECT: Some feelings of bloating in the abdomen. Passage of more gas than usual.  Walking can help get rid of the air that was put into your GI tract during the procedure and reduce the bloating. If you had a lower endoscopy (such as a colonoscopy or flexible sigmoidoscopy) you may notice spotting of blood in your stool or on the toilet paper. If you underwent a bowel prep for your procedure, you may not have a normal bowel movement for a few days.  Please Note:  You might notice some irritation and congestion in your nose or some drainage.  This is from the oxygen used during your procedure.  There is no need for concern and it should clear up in a day or so.  SYMPTOMS TO REPORT IMMEDIATELY:  Following lower endoscopy (colonoscopy):  Excessive amounts of blood in the stool  Significant tenderness or worsening of abdominal pains  Swelling of the abdomen that is new, acute  Fever of 100F or higher  For urgent or emergent issues, a gastroenterologist can be reached at any hour by calling [phone]. Do not use MyChart messaging for urgent concerns.   DIET:  We do recommend a small meal at first, but then you may proceed to your regular diet.  Drink plenty of fluids but you should avoid alcoholic beverages for 24 hours.  ACTIVITY:  You should  plan to take it easy for the rest of today and you should NOT DRIVE or use heavy machinery until tomorrow (because of the sedation medicines used during the test).    FOLLOW UP: Our staff will call the number listed on your records the next business day following your procedure.  We will call around 7:15- 8:00 am to check on you and address any questions or concerns that you may have regarding the information given to you following your procedure. If we do not reach you, we will leave a message.     If any biopsies were taken you will be contacted by phone or by letter within the next 1-3 weeks.  Please call us at [phone] if you have not heard about the biopsies in 3 weeks.   SIGNATURES/CONFIDENTIALITY: You and/or your care partner have signed paperwork which will be entered into your electronic medical record.  These signatures attest to the fact that that the information above on your After Visit Summary has been reviewed and is understood.  Full responsibility of the confidentiality of this discharge information lies with you and/or your care-partner.

## 2023-09-12 NOTE — Op Note (Signed)
 [facility] Patient Name: [name] Procedure Date: 09/12/2023 2:15 PM MRN: [id number] Endoscopist: Rosario Estefana Kidney , , [id number] Age: 48 Referring MD:  Date of Birth: 07-Dec-1975 Gender: Female Account #: [id number] Procedure:                Colonoscopy Indications:              Screening for colorectal malignant neoplasm, This                            is the patient's first colonoscopy Medicines:                Monitored Anesthesia Care Procedure:                Pre-Anesthesia Assessment:                           - Prior to the procedure, a History and Physical                            was performed, and patient medications and                            allergies were reviewed. The patient's tolerance of                            previous anesthesia was also reviewed. The risks                            and benefits of the procedure and the sedation                            options and risks were discussed with the patient.                            All questions were answered, and informed consent                            was obtained. Prior Anticoagulants: The patient has                            taken no anticoagulant or antiplatelet agents. ASA                            Grade Assessment: II - A patient with mild systemic                            disease. After reviewing the risks and benefits,                            the patient was deemed in satisfactory condition to                            undergo the procedure.  After obtaining informed consent, the colonoscope                            was passed under direct vision. Throughout the                            procedure, the patient's blood pressure, pulse, and                            oxygen saturations were monitored continuously. The                            CF [id number] #[id number] was introduced through the anus                            and advanced to  the the terminal ileum. The                            colonoscopy was performed without difficulty. The                            patient tolerated the procedure well. The quality                            of the bowel preparation was excellent. The                            terminal ileum, ileocecal valve, appendiceal                            orifice, and rectum were photographed. Scope In: 2:33:53 PM Scope Out: 2:46:05 PM Scope Withdrawal Time: 0 hours 9 minutes 29 seconds  Total Procedure Duration: 0 hours 12 minutes 12 seconds  Findings:                 The terminal ileum appeared normal.                           Multiple diverticula were found in the sigmoid                            colon and descending colon.                           Localized and scattered inflammation characterized                            by congestion (edema), erosions and erythema was                            found in the sigmoid colon and in the descending                            colon. Biopsies were taken with a cold forceps for  histology.                           Non-bleeding internal hemorrhoids were found during                            retroflexion. Complications:            No immediate complications. Estimated Blood Loss:     Estimated blood loss was minimal. Impression:               - The examined portion of the ileum was normal.                           - Diverticulosis in the sigmoid colon and in the                            descending colon.                           - Localized and scattered inflammation was found in                            the sigmoid colon and in the descending colon.                            Biopsied.                           - Non-bleeding internal hemorrhoids. Recommendation:           - Discharge patient to home (with escort).                           - Await pathology results.                           - The  findings and recommendations were discussed                            with the patient. Dr Estefana Federico Rosario Estefana Federico,  09/12/2023 2:51:48 PM

## 2023-09-12 NOTE — Progress Notes (Signed)
 Pt's states no medical or surgical changes since previsit or office visit.

## 2023-09-13 ENCOUNTER — Telehealth: Payer: Self-pay

## 2023-09-13 NOTE — Telephone Encounter (Signed)
  Follow up Call-     09/12/2023    1:28 PM  Call back number  Post procedure Call Back phone  # [phone]  Permission to leave phone message Yes     Patient questions:  Do you have a fever, pain , or abdominal swelling? No. Pain Score  0 *  Have you tolerated food without any problems? Yes.    Have you been able to return to your normal activities? Yes.    Do you have any questions about your discharge instructions: Diet   No. Medications  No. Follow up visit  No.  Do you have questions or concerns about your Care? No.  Actions: * If pain score is 4 or above: No action needed, pain <4.

## 2023-09-15 LAB — SURGICAL PATHOLOGY

## 2023-09-18 ENCOUNTER — Other Ambulatory Visit: Payer: Self-pay

## 2023-09-18 ENCOUNTER — Ambulatory Visit: Payer: Self-pay | Admitting: Internal Medicine

## 2023-09-18 DIAGNOSIS — K529 Noninfective gastroenteritis and colitis, unspecified: Secondary | ICD-10-CM

## 2023-09-18 NOTE — Progress Notes (Signed)
 Spoke to the patient about the results of her colon biopsies, which showed chronic active colitis with mild to moderate activity. She is completely asymptomatic at this time. Denies any NSAID use. When she is on her menstrual period, she will have some minor abdominal cramps. Otherwise she denies any ab pain. Denies any diarrhea or bloody stools. I discussed having her come into clinic in a few months to get some baseline labs with a CRP and fecal calprotectin, and then discussing whether or not she would be interested in any therapy despite her being completely asymptomatic. Pod B triage, let's get her scheduled for a follow up in 2 months with me or APP. Will plan for a repeat colonoscopy in 1 year to see if the inflammation is still present.

## 2023-09-25 ENCOUNTER — Emergency Department (HOSPITAL_COMMUNITY)
Admission: EM | Admit: 2023-09-25 | Discharge: 2023-09-25 | Disposition: A | Discharge status: Home / Self Care | Attending: Emergency Medicine | Admitting: Emergency Medicine

## 2023-09-25 ENCOUNTER — Telehealth: Payer: Self-pay | Admitting: Internal Medicine

## 2023-09-25 ENCOUNTER — Encounter (HOSPITAL_COMMUNITY): Payer: Self-pay

## 2023-09-25 ENCOUNTER — Other Ambulatory Visit: Payer: Self-pay

## 2023-09-25 ENCOUNTER — Emergency Department (HOSPITAL_COMMUNITY)

## 2023-09-25 DIAGNOSIS — I10 Essential (primary) hypertension: Secondary | ICD-10-CM | POA: Diagnosis not present

## 2023-09-25 DIAGNOSIS — M545 Low back pain, unspecified: Secondary | ICD-10-CM

## 2023-09-25 DIAGNOSIS — Z79899 Other long term (current) drug therapy: Secondary | ICD-10-CM | POA: Insufficient documentation

## 2023-09-25 DIAGNOSIS — E119 Type 2 diabetes mellitus without complications: Secondary | ICD-10-CM | POA: Insufficient documentation

## 2023-09-25 DIAGNOSIS — Z7984 Long term (current) use of oral hypoglycemic drugs: Secondary | ICD-10-CM | POA: Insufficient documentation

## 2023-09-25 DIAGNOSIS — D72829 Elevated white blood cell count, unspecified: Secondary | ICD-10-CM | POA: Diagnosis not present

## 2023-09-25 DIAGNOSIS — E875 Hyperkalemia: Secondary | ICD-10-CM | POA: Insufficient documentation

## 2023-09-25 DIAGNOSIS — N281 Cyst of kidney, acquired: Secondary | ICD-10-CM | POA: Insufficient documentation

## 2023-09-25 DIAGNOSIS — K5730 Diverticulosis of large intestine without perforation or abscess without bleeding: Secondary | ICD-10-CM | POA: Diagnosis not present

## 2023-09-25 DIAGNOSIS — R109 Unspecified abdominal pain: Secondary | ICD-10-CM | POA: Diagnosis not present

## 2023-09-25 DIAGNOSIS — N289 Disorder of kidney and ureter, unspecified: Secondary | ICD-10-CM | POA: Diagnosis not present

## 2023-09-25 LAB — URINALYSIS, ROUTINE W REFLEX MICROSCOPIC
Bilirubin Urine: NEGATIVE
Glucose, UA: NEGATIVE mg/dL
Hgb urine dipstick: NEGATIVE
Ketones, ur: NEGATIVE mg/dL
Nitrite: NEGATIVE
Protein, ur: NEGATIVE mg/dL
Specific Gravity, Urine: 1.021 (ref 1.005–1.030)
pH: 5 (ref 5.0–8.0)

## 2023-09-25 LAB — BASIC METABOLIC PANEL WITH GFR
Anion gap: 8 (ref 5–15)
BUN: 16 mg/dL (ref 6–20)
CO2: 20 mmol/L — ABNORMAL LOW (ref 22–32)
Calcium: 8.9 mg/dL (ref 8.9–10.3)
Chloride: 105 mmol/L (ref 98–111)
Creatinine, Ser: 1.2 mg/dL — ABNORMAL HIGH (ref 0.44–1.00)
GFR, Estimated: 57 mL/min — ABNORMAL LOW (ref >60)
Glucose, Bld: 145 mg/dL — ABNORMAL HIGH (ref 70–99)
Potassium: 5.2 mmol/L — ABNORMAL HIGH (ref 3.5–5.1)
Sodium: 133 mmol/L — ABNORMAL LOW (ref 135–145)

## 2023-09-25 LAB — CBC
HCT: 35 % — ABNORMAL LOW (ref 36.0–46.0)
Hemoglobin: 11 g/dL — ABNORMAL LOW (ref 12.0–15.0)
MCH: 26.4 pg (ref 26.0–34.0)
MCHC: 31.4 g/dL (ref 30.0–36.0)
MCV: 83.9 fL (ref 80.0–100.0)
Platelets: 364 K/uL (ref 150–400)
RBC: 4.17 MIL/uL (ref 3.87–5.11)
RDW: 14.3 % (ref 11.5–15.5)
WBC: 10.9 K/uL — ABNORMAL HIGH (ref 4.0–10.5)
nRBC: 0 % (ref 0.0–0.2)

## 2023-09-25 LAB — HEPATIC FUNCTION PANEL
ALT: 22 U/L (ref 0–44)
AST: 34 U/L (ref 15–41)
Albumin: 3.9 g/dL (ref 3.5–5.0)
Alkaline Phosphatase: 60 U/L (ref 38–126)
Bilirubin, Direct: 0.5 mg/dL — ABNORMAL HIGH (ref 0.0–0.2)
Indirect Bilirubin: 1 mg/dL — ABNORMAL HIGH (ref 0.3–0.9)
Total Bilirubin: 1.5 mg/dL — ABNORMAL HIGH (ref 0.0–1.2)
Total Protein: 8.7 g/dL — ABNORMAL HIGH (ref 6.5–8.1)

## 2023-09-25 LAB — LIPASE, BLOOD: Lipase: 24 U/L (ref 11–51)

## 2023-09-25 LAB — HCG, SERUM, QUALITATIVE: Preg, Serum: NEGATIVE

## 2023-09-25 MED ORDER — DICLOFENAC SODIUM 1 % EX GEL: 4.0000 g | Freq: Four times a day (QID) | CUTANEOUS | 0 refills | Status: DC

## 2023-09-25 MED ORDER — KETOROLAC TROMETHAMINE 15 MG/ML IJ SOLN
15.0000 mg | Freq: Once | INTRAMUSCULAR | Status: AC
  Administered 2023-09-25: 15 mg via INTRAVENOUS
  Filled 2023-09-25: qty 1

## 2023-09-25 NOTE — Telephone Encounter (Signed)
 Pt stated that she was having lower back pain. Pt stated that she was not having any abdominal pain. Pt stated that she plans to head to the ER for an assessment. Pt verbalized understanding with all questions answered.

## 2023-09-25 NOTE — ED Triage Notes (Signed)
 Patient has left sided flank pain for 2 days. Had a colonoscopy 2 weeks ago and was diagnosed with diverticulosis. No vomiting or diarrhea. No burning urination.

## 2023-09-25 NOTE — Discharge Instructions (Signed)
 Use Tylenol  and the diclofenac  gel for your back pain.  Your kidney function was mildly elevated.  For this reason, avoid taking ibuprofen , Aleve or other anti-inflammatory medications.  You need to have this rechecked by your primary care doctor.  Return to the emergency room if you have any worsening symptoms.  You did have a cyst on your kidney which will need further evaluation with an MRI.  This can be ordered by your primary care doctor.

## 2023-09-25 NOTE — ED Notes (Addendum)
 Patient d/c with home care instructions. IV discontinued. Family at bedside

## 2023-09-25 NOTE — ED Provider Notes (Signed)
 [facility]MENT AT [facility] Provider Note   CSN: [id number] Arrival date & time: 09/25/23  1410     Patient presents with: Flank Pain   [name] is a 48 y.o. female.   Patient is a 48 year old female who presents with back pain.  She states she has had about a 3-day history of some pain in her left lower back.  Its gotten worse over the last day or 2.  There is no radiation of the pain.  No radiation down her legs.  No associated abdominal pain.  No urinary symptoms.  No fevers.  No loss of bowel or bladder control.  No numbness or weakness to her extremities.  No recent injury noted to her back.  No similar symptoms in the past.  She did have a recent colonoscopy about 2 weeks ago and was diagnosed with diverticulosis.  No nausea or vomiting.  No change in her stools.  Patient says the pain is worse with movements and with ambulation.       Prior to Admission medications   Medication Sig Start Date End Date Taking? Authorizing Provider  diclofenac  Sodium (VOLTAREN ) 1 % GEL Apply 4 g topically 4 (four) times daily. 09/25/23  Yes Lenor Hollering, MD  Accu-Chek FastClix Lancets MISC Use as instructed. Check blood glucose level by fingerstick twice per day. 08/08/22   Fleming, Zelda W, NP  ARIPiprazole  ER (ABILIFY  MAINTENA) 400 MG SRER injection Inject 2 mLs (400 mg total) into the muscle every 28 (twenty-eight) days. Due 8/25 10/29/18   Malinda Rogue, MD  atorvastatin  (LIPITOR) 20 MG tablet TAKE ONE TABLET BY MOUTH DAILY 08/08/23   Fleming, Zelda W, NP  benztropine  (COGENTIN ) 1 MG tablet Take 1 mg by mouth 2 (two) times daily.    [provider]  Blood Glucose Monitoring Suppl (ACCU-CHEK GUIDE ME) w/Device KIT Use to check blood sugar twice per day 08/08/22   Fleming, Zelda W, NP  FEROSUL 325 (65 Fe) MG tablet TAKE ONE TABLET BY MOUTH DAILY 07/10/23   Fleming, Zelda W, NP  glucose blood (ACCU-CHEK GUIDE) test strip Use as instructed. Check blood glucose  level by fingerstick twice per day. 08/08/22   Fleming, Zelda W, NP  hydrOXYzine  (ATARAX ) 50 MG tablet Take 50 mg by mouth 3 (three) times daily as needed.    [provider]  hydrOXYzine  (VISTARIL ) 50 MG capsule Take 50 mg by mouth 3 (three) times daily. 06/30/23   [provider]  Lancets Misc. (ACCU-CHEK FASTCLIX LANCET) KIT Use as instructed. Check blood glucose level by fingerstick twice per day. 08/08/22   Fleming, Zelda W, NP  LORazepam  (ATIVAN ) 1 MG tablet lorazepam  1 mg tablet    [provider]  losartan  (COZAAR ) 25 MG tablet TAKE ONE TABLET BY MOUTH DAILY 08/08/23   Fleming, Zelda W, NP  metFORMIN  (GLUCOPHAGE ) 500 MG tablet TAKE 1.5 TABLETS (750 MG TOTAL) BY MOUTH TWO (TWO) TIMES DAILY WITH A MEAL 08/08/23   Theotis Haze ORN, NP    Allergies: Geodon  [ziprasidone ], Haldol  Dacy.Cramp ], Risperidone  and paliperidone , and Valproic acid     Review of Systems  Constitutional:  Negative for chills, diaphoresis, fatigue and fever.  HENT:  Negative for congestion, rhinorrhea and sneezing.   Eyes: Negative.   Respiratory:  Negative for cough, chest tightness and shortness of breath.   Cardiovascular:  Negative for chest pain and leg swelling.  Gastrointestinal:  Negative for abdominal pain, blood in stool, diarrhea, nausea and vomiting.  Genitourinary:  Negative for difficulty urinating, flank pain, frequency and hematuria.  Musculoskeletal:  Positive for back pain. Negative for arthralgias.  Skin:  Negative for rash.  Neurological:  Negative for dizziness, speech difficulty, weakness, numbness and headaches.    Updated Vital Signs BP (!) 137/96   Pulse 99   Temp 98.4 F (36.9 C) (Oral)   Resp 18   Ht 5' 3 (1.6 m)   Wt 97.1 kg   SpO2 99%   BMI 37.91 kg/m   Physical Exam Constitutional:      Appearance: She is well-developed.  HENT:     Head: Normocephalic and atraumatic.  Eyes:     Pupils: Pupils are equal, round, and reactive to light.  Cardiovascular:      Rate and Rhythm: Normal rate and regular rhythm.     Heart sounds: Normal heart sounds.  Pulmonary:     Effort: Pulmonary effort is normal. No respiratory distress.     Breath sounds: Normal breath sounds. No wheezing or rales.  Chest:     Chest wall: No tenderness.  Abdominal:     General: Bowel sounds are normal.     Palpations: Abdomen is soft.     Tenderness: There is no abdominal tenderness. There is no guarding or rebound.  Musculoskeletal:        General: Normal range of motion.     Cervical back: Normal range of motion and neck supple.     Comments: No reproducible tenderness to the spine or lower back.,  Negative straight leg raise bilaterally  Lymphadenopathy:     Cervical: No cervical adenopathy.  Skin:    General: Skin is warm and dry.     Findings: No rash.  Neurological:     Mental Status: She is alert and oriented to person, place, and time.     Comments: Motor 5 out of 5 all extremities, sensation grossly intact to light touch all extremities, pedal pulses intact     (all labs ordered are listed, but only abnormal results are displayed) Labs Reviewed  URINALYSIS, ROUTINE W REFLEX MICROSCOPIC - Abnormal; Notable for the following components:      Result Value   APPearance HAZY (*)    Leukocytes,Ua MODERATE (*)    Bacteria, UA RARE (*)    All other components within normal limits  BASIC METABOLIC PANEL WITH GFR - Abnormal; Notable for the following components:   Sodium 133 (*)    Potassium 5.2 (*)    CO2 20 (*)    Glucose, Bld 145 (*)    Creatinine, Ser 1.20 (*)    GFR, Estimated 57 (*)    All other components within normal limits  CBC - Abnormal; Notable for the following components:   WBC 10.9 (*)    Hemoglobin 11.0 (*)    HCT 35.0 (*)    All other components within normal limits  HEPATIC FUNCTION PANEL - Abnormal; Notable for the following components:   Total Protein 8.7 (*)    Total Bilirubin 1.5 (*)    Bilirubin, Direct 0.5 (*)    Indirect  Bilirubin 1.0 (*)    All other components within normal limits  HCG, SERUM, QUALITATIVE  LIPASE, BLOOD    EKG: None  Radiology: CT Renal Stone Study Result Date: 09/25/2023 CLINICAL DATA:  Abdominal/flank pain, stone suspected left sided flank pain for 2 days. Had a colonoscopy 2 weeks ago and was diagnosed with diverticulosis. No vomiting or diarrhea. No burning urination. EXAM: CT ABDOMEN AND PELVIS WITHOUT  CONTRAST TECHNIQUE: Multidetector CT imaging of the abdomen and pelvis was performed following the standard protocol without IV contrast. RADIATION DOSE REDUCTION: This exam was performed according to the departmental dose-optimization program which includes automated exposure control, adjustment of the mA and/or kV according to patient size and/or use of iterative reconstruction technique. COMPARISON:  None Available. FINDINGS: Lower chest: No acute abnormality. Hepatobiliary: No focal liver abnormality. No gallstones, gallbladder wall thickening, or pericholecystic fluid. No biliary dilatation. Pancreas: No focal lesion. Normal pancreatic contour. No surrounding inflammatory changes. No main pancreatic ductal dilatation. Spleen: Normal in size without focal abnormality. Adrenals/Urinary Tract: No adrenal nodule bilaterally. No nephrolithiasis and no hydronephrosis. A 1.2 cm right renal hyperdense lesion with a density of 57 Hounsfield units. No ureterolithiasis or hydroureter. No nephroureterolithiasis bilaterally. The urinary bladder is unremarkable. Stomach/Bowel: Stomach is within normal limits. No evidence of bowel wall thickening or dilatation. Colonic diverticulosis. Appendix appears normal. Vascular/Lymphatic: No abdominal aorta or iliac aneurysm. No abdominal, pelvic, or inguinal lymphadenopathy. Reproductive: Uterus and bilateral adnexa are unremarkable. Other: No intraperitoneal free fluid. No intraperitoneal free gas. No organized fluid collection. Musculoskeletal: No abdominal wall  hernia or abnormality. No suspicious lytic or blastic osseous lesions. No acute displaced fracture. IMPRESSION: 1. Colonic diverticulosis with no acute diverticulitis. 2. Indeterminate 1.2 cm right renal lesion. When the patient is clinically stable and able to follow directions and hold their breath (preferably as an outpatient) further evaluation with dedicated MRI renal protocol should be considered. Electronically Signed   By: Morgane  Naveau M.D.   On: 09/25/2023 18:35     Procedures   Medications Ordered in the ED  ketorolac  (TORADOL ) 15 MG/ML injection 15 mg (15 mg Intravenous Given 09/25/23 1547)                                    Medical Decision Making Amount and/or Complexity of Data Reviewed Labs: ordered. Radiology: ordered.  Risk Prescription drug management.   This patient presents to the ED for concern of back pain, this involves an extensive number of treatment options, and is a complaint that carries with it a high risk of complications and morbidity.  I considered the following differential and admission for this acute, potentially life threatening condition.  The differential diagnosis includes kidney stone, musculoskeletal pain, diverticulitis, perforation, cauda equina, epidural abscess/hematoma  MDM:    Patient is a 48 year old female who presents with some left-sided back pain.  She describes some flank pain but no specific abdominal pain.  Does seem to be worse with movement and likely is musculoskeletal.  However she was fairly tachycardic here in the ED with heart rates in the 120s.  She does states she has a history of anxiety but does not feel anxious here.  Her labs show minimal elevation in her WBC count.  Her creatinine is mildly elevated as compared to prior values.  Her glucose is mildly elevated without suggestions of DKA.  Her urine appears to be more of a dirty specimen rather than an actual UTI.  She is not having urinary symptoms.  Given her flank pain  with tachycardia, CT scan was performed which does not show any acute abnormalities.  There is a cystic structure on her kidney which will need further evaluation with MRI.  This can be done as an outpatient.  She was given 1 dose of Toradol  in the ED and her pain is much better.  Her heart rate has normalized.  I suspect she has musculoskeletal back pain.  No evidence of cauda equina.  No neurologic deficits.  Given her elevated creatinine, advised her to stay away from NSAIDs.  She was given a prescription for Voltaren  gel and advised that she can use Tylenol .  She declines the need for any further pain medications or muscle relaxers.  She was advised that she will need to follow-up with her PCP to have further evaluation of the renal cyst as well as recheck in her creatinine.  She was given some IV fluids as well.  Return precautions were given.  (Labs, imaging, consults)  Labs: I Ordered, and personally interpreted labs.  The pertinent results include: Mildly elevated creatinine, mildly elevated WBC count,minimal elevation in her potassium  Imaging Studies ordered: I ordered imaging studies including CT abdomen pelvis I independently visualized and interpreted imaging. I agree with the radiologist interpretation  Additional history obtained from chart.  External records from outside source obtained and reviewed including iron  notes, labs  Cardiac Monitoring: The patient is maintained on a cardiac monitor.  If on the cardiac monitor, I personally viewed and interpreted the cardiac monitored which showed an underlying rhythm of: Sinus tachycardia  Reevaluation: After the interventions noted above, I reevaluated the patient and found that they have :improved  Social Determinants of Health:  psychiatric disease  Disposition: Discharged home  Co morbidities that complicate the patient evaluation  Past Medical History:  Diagnosis Date   Anxiety    Bipolar affective (HCC)    Depression     Diabetes mellitus without complication (HCC)    Hyperlipidemia    Hypertension    OCD (obsessive compulsive disorder)    Schizoaffective disorder (HCC) 2000     Medicines Meds ordered this encounter  Medications   ketorolac  (TORADOL ) 15 MG/ML injection 15 mg   diclofenac  Sodium (VOLTAREN ) 1 % GEL    Sig: Apply 4 g topically 4 (four) times daily.    Dispense:  150 g    Refill:  0    I have reviewed the patients home medicines and have made adjustments as needed  Problem List / ED Course: Problem List Items Addressed This Visit   None Visit Diagnoses       Acute left-sided low back pain without sciatica    -  Primary   Relevant Medications   ketorolac  (TORADOL ) 15 MG/ML injection 15 mg (Completed)     Renal cyst                     Final diagnoses:  Acute left-sided low back pain without sciatica  Renal cyst    ED Discharge Orders          Ordered    diclofenac  Sodium (VOLTAREN ) 1 % GEL  4 times daily        09/25/23 2006               Lenor Hollering, MD 09/25/23 2020

## 2023-09-25 NOTE — Telephone Encounter (Signed)
 Inbound call from patient stating she recently had a colonoscopy procedure with Dr.Dorsey on 09/12/2023 and would like to speak to nurse in regards to sharp side pain. Informed patient we do not do same day visits but if she continues to feel that way to visit the ER. Requesting a call back   Please advise  Thank you

## 2023-09-25 NOTE — ED Notes (Signed)
 Patient resting in bed.

## 2023-11-17 ENCOUNTER — Other Ambulatory Visit: Payer: Self-pay | Admitting: Nurse Practitioner

## 2023-11-17 ENCOUNTER — Ambulatory Visit: Admitting: Internal Medicine

## 2023-11-17 DIAGNOSIS — I10 Essential (primary) hypertension: Secondary | ICD-10-CM

## 2023-11-17 DIAGNOSIS — E1165 Type 2 diabetes mellitus with hyperglycemia: Secondary | ICD-10-CM

## 2023-11-17 DIAGNOSIS — Z7984 Long term (current) use of oral hypoglycemic drugs: Secondary | ICD-10-CM

## 2023-11-17 DIAGNOSIS — E785 Hyperlipidemia, unspecified: Secondary | ICD-10-CM

## 2023-11-17 NOTE — Telephone Encounter (Signed)
 Copied from CRM [id number]. Topic: Clinical - Medication Refill >> Nov 17, 2023  4:06 PM Ivette P wrote: Medication: atorvastatin  (LIPITOR) 20 MG tablet losartan  (COZAAR ) 25 MG tablet metFORMIN  (GLUCOPHAGE ) 500 MG tablet     Has the patient contacted their pharmacy? Yes (Agent: If no, request that the patient contact the pharmacy for the refill. If patient does not wish to contact the pharmacy document the reason why and proceed with request.) (Agent: If yes, when and what did the pharmacy advise?)  This is the patient's preferred pharmacy:  [facility] - [place], [place] - [address] [place] [address] Phone: [phone] Fax: [phone]   Is this the correct pharmacy for this prescription? Yes If no, delete pharmacy and type the correct one.   Has the prescription been filled recently? No  Is the patient out of the medication? Yes  Has the patient been seen for an appointment in the last year OR does the patient have an upcoming appointment? Yes  Can we respond through MyChart? Yes  Agent: Please be advised that Rx refills may take up to 3 business days. We ask that you follow-up with your pharmacy.

## 2023-11-20 MED ORDER — LOSARTAN POTASSIUM 25 MG PO TABS: 25.0000 mg | ORAL_TABLET | Freq: Every day | ORAL | 0 refills | Status: DC

## 2023-11-20 MED ORDER — METFORMIN HCL 500 MG PO TABS: 750.0000 mg | ORAL_TABLET | Freq: Two times a day (BID) | ORAL | 0 refills | Status: DC

## 2023-11-20 MED ORDER — ATORVASTATIN CALCIUM 20 MG PO TABS: 20.0000 mg | ORAL_TABLET | Freq: Every day | ORAL | 0 refills | Status: DC

## 2023-11-20 NOTE — Telephone Encounter (Signed)
 [facility] Requested Prescriptions  Pending Prescriptions Disp Refills   atorvastatin  (LIPITOR) 20 MG tablet 90 tablet 0    Sig: Take 1 tablet (20 mg total) by mouth daily.     Cardiovascular:  Antilipid - Statins Failed - 11/20/2023 11:47 AM      Failed - Lipid Panel in normal range within the last 12 months    Cholesterol, Total  Date Value Ref Range Status  06/02/2023 107 100 - 199 mg/dL Final   LDL Chol Calc (NIH)  Date Value Ref Range Status  06/02/2023 60 0 - 99 mg/dL Final   HDL  Date Value Ref Range Status  06/02/2023 31 (L) >39 mg/dL Final   Triglycerides  Date Value Ref Range Status  06/02/2023 78 0 - 149 mg/dL Final         Passed - Patient is not pregnant      Passed - Valid encounter within last 12 months    Recent Outpatient Visits           5 months ago Type 2 diabetes mellitus with hyperglycemia, unspecified whether long term insulin use (HCC)   [facility] [place]. [facility] [place], [place] W, NP   10 months ago Type 2 diabetes mellitus with hyperglycemia, unspecified whether long term insulin use (HCC)   [facility] [place]. [facility] [place], [place], [place]   1 year ago Type 2 diabetes mellitus with hyperglycemia, unspecified whether long term insulin use (HCC)   [facility] [place]. [facility] [place], [place] W, NP   1 year ago Type 2 diabetes mellitus with hyperglycemia, unspecified whether long term insulin use (HCC)   [facility] Of [place]. [facility] [place], [place], [place]   1 year ago Encounter for Papanicolaou smear for cervical cancer screening   [facility] [place]. [facility] [place], Zelda W, NP               losartan  (COZAAR ) 25 MG tablet 90 tablet 0    Sig: Take 1 tablet (25 mg total) by mouth daily.     Cardiovascular:   Angiotensin Receptor Blockers Failed - 11/20/2023 11:47 AM      Failed - Cr in normal range and within 180 days    Creatinine, Ser  Date Value Ref Range Status  09/25/2023 1.20 (H) 0.44 - 1.00 mg/dL Final   Creatinine,U  Date Value Ref Range Status  02/27/2007   Final   65.6 (NOTE)  Cutoff Values for Urine Drug Screen:        Drug Class           Cutoff (ng/mL)        Amphetamines            1000        Barbiturates             200        Cocaine Metabolites      300        Benzodiazepines          200        Methadone                 300        Opiates  2000        Phencyclidine             25        Propoxyphene             300        Marijuana Metabolites     50  For medical purposes only.         Failed - K in normal range and within 180 days    Potassium  Date Value Ref Range Status  09/25/2023 5.2 (H) 3.5 - 5.1 mmol/L Final    Comment:    MODERATE HEMOLYSIS         Failed - Last BP in normal range    BP Readings from Last 1 Encounters:  09/25/23 (!) 116/98         Passed - Patient is not pregnant      Passed - Valid encounter within last 6 months    Recent Outpatient Visits           5 months ago Type 2 diabetes mellitus with hyperglycemia, unspecified whether long term insulin use (HCC)   [facility] [place]. [facility] [place], [place] W, NP   10 months ago Type 2 diabetes mellitus with hyperglycemia, unspecified whether long term insulin use (HCC)   [facility] [place]. [facility] [place], [place], [place]   1 year ago Type 2 diabetes mellitus with hyperglycemia, unspecified whether long term insulin use (HCC)   [facility] Dept Of [place]. [facility] [place], [place] W, NP   1 year ago Type 2 diabetes mellitus with hyperglycemia, unspecified whether long term insulin use (HCC)   [facility] Dept Of [place]. [facility] [place], [place], [place]   1 year ago Encounter for Papanicolaou smear for cervical cancer screening   [facility] Dept Of [place]. [facility] [place], [place] W, NP               metFORMIN  (GLUCOPHAGE ) 500 MG tablet 270 tablet 0    Sig: Take 1.5 tablets (750 mg total) by mouth 2 (two) times daily with a meal.     Endocrinology:  Diabetes - Biguanides Failed - 11/20/2023 11:47 AM      Failed - Cr in normal range and within 360 days    Creatinine, Ser  Date Value Ref Range Status  09/25/2023 1.20 (H) 0.44 - 1.00 mg/dL Final   Creatinine,U  Date Value Ref Range Status  02/27/2007   Final   65.6 (NOTE)  Cutoff Values for Urine Drug Screen:        Drug Class           Cutoff (ng/mL)        Amphetamines            1000        Barbiturates             200        Cocaine Metabolites      300        Benzodiazepines          200        Methadone                 300  Opiates                 2000        Phencyclidine             25        Propoxyphene             300        Marijuana Metabolites     50  For medical purposes only.         Failed - eGFR in normal range and within 360 days    GFR calc Af Amer  Date Value Ref Range Status  09/27/2018 58 (L) >60 mL/min Final   GFR, Estimated  Date Value Ref Range Status  09/25/2023 57 (L) >60 mL/min Final    Comment:    (NOTE) Calculated using the CKD-EPI Creatinine Equation (2021)    eGFR  Date Value Ref Range Status  06/02/2023 66 >59 mL/min/1.73 Final         Failed - B12 Level in normal range and within 720 days    No results found for: VITAMINB12       Passed - HBA1C is between 0 and 7.9 and within 180 days    HbA1c, POC (controlled diabetic range)  Date Value Ref Range Status  06/02/2023 6.7 0.0 - 7.0 % Final         Passed - Valid encounter within last 6 months    Recent Outpatient Visits           5 months ago Type 2 diabetes mellitus with hyperglycemia,  unspecified whether long term insulin use (HCC)   [facility] [place]. [facility] [place], [place] W, NP   10 months ago Type 2 diabetes mellitus with hyperglycemia, unspecified whether long term insulin use (HCC)   [facility] Of [place]. [facility] [place], [place], [place]   1 year ago Type 2 diabetes mellitus with hyperglycemia, unspecified whether long term insulin use (HCC)   [facility] Dept Of [place]. [facility] [place], [place] W, NP   1 year ago Type 2 diabetes mellitus with hyperglycemia, unspecified whether long term insulin use (HCC)   [facility] [place]. [facility] [place], [place], [place]   1 year ago Encounter for Papanicolaou smear for cervical cancer screening   [facility] [place]. [facility] [place], Haze ORN, NP              Passed - CBC within normal limits and completed in the last 12 months    WBC  Date Value Ref Range Status  09/25/2023 10.9 (H) 4.0 - 10.5 K/uL Final   RBC  Date Value Ref Range Status  09/25/2023 4.17 3.87 - 5.11 MIL/uL Final   Hemoglobin  Date Value Ref Range Status  09/25/2023 11.0 (L) 12.0 - 15.0 g/dL Final  96/71/7974 88.7 11.1 - 15.9 g/dL Final   HCT  Date Value Ref Range Status  09/25/2023 35.0 (L) 36.0 - 46.0 % Final   Hematocrit  Date Value Ref Range Status  06/02/2023 34.9 34.0 - 46.6 % Final   MCHC  Date Value Ref Range Status  09/25/2023 31.4 30.0 - 36.0 g/dL Final   [facility]  Date Value Ref Range Status  09/25/2023 26.4  26.0 - 34.0 pg Final   MCV  Date Value Ref Range Status  09/25/2023 83.9 80.0 - 100.0 fL Final  06/02/2023 85 79 - 97 fL Final   No results found for: PLTCOUNTKUC, LABPLAT, POCPLA RDW  Date Value Ref Range Status  09/25/2023 14.3 11.5 - 15.5 % Final  06/02/2023 15.2 11.7 - 15.4 % Final

## 2023-12-04 ENCOUNTER — Ambulatory Visit: Attending: Nurse Practitioner | Admitting: Nurse Practitioner

## 2023-12-04 ENCOUNTER — Encounter: Payer: Self-pay | Admitting: Nurse Practitioner

## 2023-12-04 DIAGNOSIS — E785 Hyperlipidemia, unspecified: Secondary | ICD-10-CM | POA: Diagnosis not present

## 2023-12-04 DIAGNOSIS — Z7984 Long term (current) use of oral hypoglycemic drugs: Secondary | ICD-10-CM | POA: Diagnosis not present

## 2023-12-04 DIAGNOSIS — I10 Essential (primary) hypertension: Secondary | ICD-10-CM | POA: Diagnosis not present

## 2023-12-04 DIAGNOSIS — Z79899 Other long term (current) drug therapy: Secondary | ICD-10-CM | POA: Diagnosis not present

## 2023-12-04 DIAGNOSIS — E1165 Type 2 diabetes mellitus with hyperglycemia: Secondary | ICD-10-CM

## 2023-12-04 LAB — POCT GLYCOSYLATED HEMOGLOBIN (HGB A1C): HbA1c, POC (controlled diabetic range): 6.4 % (ref 0.0–7.0)

## 2023-12-04 MED ORDER — ATORVASTATIN CALCIUM 20 MG PO TABS: 20.0000 mg | ORAL_TABLET | Freq: Every day | ORAL | 1 refills | Status: AC

## 2023-12-04 MED ORDER — LOSARTAN POTASSIUM 25 MG PO TABS: 25.0000 mg | ORAL_TABLET | Freq: Every day | ORAL | 1 refills | Status: AC

## 2023-12-04 NOTE — Patient Instructions (Signed)
 [facility] [address] [place],  [place]  [address] Main: [phone]

## 2023-12-04 NOTE — Progress Notes (Addendum)
 12/05/2023 [name] [id number] 1975-08-12  Referring provider: Theotis [name] ORN, NP Primary GI doctor: Dr. Federico  ASSESSMENT AND PLAN:  Colitis on colonoscopy Patient has very rare NSAID use ( once a month), rare ETOH (every few months) and denies symptoms of colitis, no rashes, no joint pain, no family history of IBD 09/12/2023 colonoscopy Dr. Federico for screening purposes excellent bowel prep normal TI, diverticulosis sigmoid descending colon, localized scattered inflammation found in sigmoid colon and descending colon, nonbleeding internal hemorrhoids.  Pathology showed chronic active colitis with mild to moderate activity negative for dysplasia recall colonoscopy 1 year 09/25/2023 CT renal stone colonic diverticulosis without diverticulitis no evidence of colitis, 1.2 cm right renal lesion needs MRI renal protocol  Uncertain if this is potentially very early IBD without symptoms versus NSAIDs/medication effect but patient has no symptoms at this time so we will check stool for inflammation get MRI abdomen for renal lesion but also evaluate bowel and monitor closely -Pending fecal calprotectin (declines further blood work on yesterday with primary care) - Avoid NSAIDs - Alert our office if any changes in symptoms bowels etc.  Right renal lesion on CT renal stone study 1.2 cm right renal lesion interderminate -Will schedule for MRI renal protocol to evaluate -Follow up PCP  Iron  deficiency anemia 09/25/2023  HGB 11.0 MCV 83.9 Platelets 364 06/02/2023 Iron  28 Ferritin 40  Recent Labs    06/02/23 1041 09/25/23 1450 12/04/23 1044  HGB 11.2 11.0* 11.4  On an oral iron , likely from menses -Check H pylori, mild GERD, treat if positive -Continue to monitor -Consider EGD  Bipolar disorder, cannabis use Denies tobacco use or drug use  I have reviewed the clinic note as outlined by [name] and agree with the assessment, plan and medical decision making.  [name] presents for follow-up status post screening colonoscopy 09/2023.  This showed evidence of diverticulosis and scattered inflammatory changes in the descending and sigmoid colon.  Biopsies with chronic colitis.  Query if this may represent SCAD.  Patient is asymptomatic.  Advised that patient can be offered trial of mesalamine such as Lialda 4.8 g daily with a follow-up fecal calprotectin in 2 to 3 months.  Alternatively, patient can be monitored conservatively.  If symptoms evolve or fecal calprotectin rechecked in 2 to 3 months is higher consider initiation of treatment at that time.  It is possible that SCAD could be contributing to iron  deficiency anemia.  Patient can follow-up with Dr. Federico upon her return to the office.  Barbara Hausen, MD   Patient Care Team: [name] ORN, NP as PCP - General (Nurse Practitioner)  HISTORY OF PRESENT ILLNESS: 48 y.o. female with a past medical history listed below presents for evaluation of colitis on colonoscopy.   Discussed the use of AI scribe software for clinical note transcription with the patient, who gave verbal consent to proceed.  History of Present Illness   [name] is a 48 year old female who presents for follow-up after a colonoscopy showing diverticulosis, internal hemorrhoids, and inflammation in the sigmoid and descending colon.  She underwent a colonoscopy on July 8th for screening purposes, which revealed diverticulosis, internal hemorrhoids, and inflammation in the sigmoid and descending colon. Biopsies were taken during the colonoscopy, and showed active chronic colitis but no cancer or dysplasia.  She does not regularly take NSAIDs such as Aleve, ibuprofen , or Goody Powders, only using them rarely for headaches. She did not experience any significant pain  or injury prior to the colonoscopy.  Post-procedure, she experienced some back and side pain but feels fine now. No changes in bowel habits, diarrhea, constipation,  blood in stool, or abdominal pain. She has regular bowel movements every morning.  No weight loss, rashes, joint pain, nausea, vomiting, or dark black stools, although she takes iron  pills. She experiences occasional heartburn but does not take medication for it.  There is no family history of autoimmune diseases like Crohn's or ulcerative colitis. She consumes alcohol very infrequently, approximately every four to five months, and does not use tobacco or drugs.     She  reports that she has quit smoking. Her smoking use included cigarettes. She has a 1 pack-year smoking history. She has never used smokeless tobacco. She reports that she does not currently use alcohol. She reports that she does not currently use drugs after having used the following drugs: Marijuana.  RELEVANT GI HISTORY, IMAGING AND LABS: Results   DIAGNOSTIC Colonoscopy: Diverticulosis, internal hemorrhoids, inflammation in sigmoid and descending colon (09/12/2023)  PATHOLOGY Biopsy: Active chronic inflammation, no dysplasia, no malignancy (09/12/2023)      CBC    Component Value Date/Time   WBC 8.6 12/04/2023 1044   WBC 10.9 (H) 09/25/2023 1450   RBC 4.14 12/04/2023 1044   RBC 4.17 09/25/2023 1450   HGB 11.4 12/04/2023 1044   HCT 35.8 12/04/2023 1044   PLT 371 12/04/2023 1044   MCV 87 12/04/2023 1044   MCH 27.5 12/04/2023 1044   MCH 26.4 09/25/2023 1450   MCHC 31.8 12/04/2023 1044   MCHC 31.4 09/25/2023 1450   RDW 15.4 12/04/2023 1044   LYMPHSABS 2.6 12/04/2023 1044   MONOABS 0.3 08/25/2016 1820   EOSABS 0.1 12/04/2023 1044   BASOSABS 0.0 12/04/2023 1044   Recent Labs    06/02/23 1041 09/25/23 1450 12/04/23 1044  HGB 11.2 11.0* 11.4    CMP     Component Value Date/Time   NA 136 12/04/2023 1044   K 4.9 12/04/2023 1044   CL 103 12/04/2023 1044   CO2 16 (L) 12/04/2023 1044   GLUCOSE 144 (H) 12/04/2023 1044   GLUCOSE 145 (H) 09/25/2023 1450   BUN 17 12/04/2023 1044   CREATININE 1.08 (H)  12/04/2023 1044   CALCIUM  9.7 12/04/2023 1044   PROT 8.0 12/04/2023 1044   ALBUMIN 4.4 12/04/2023 1044   AST 14 12/04/2023 1044   ALT 14 12/04/2023 1044   ALKPHOS 74 12/04/2023 1044   BILITOT 0.2 12/04/2023 1044   GFRNONAA 57 (L) 09/25/2023 1450   GFRAA 58 (L) 09/27/2018 0655      Latest Ref Rng & Units 12/04/2023   10:44 AM 09/25/2023    2:50 PM 06/02/2023   10:41 AM  Hepatic Function  Total Protein 6.0 - 8.5 g/dL 8.0  8.7  8.0   Albumin 3.9 - 4.9 g/dL 4.4  3.9  4.4   AST 0 - 40 IU/L 14  34  19   ALT 0 - 32 IU/L 14  22  17    Alk Phosphatase 41 - 116 IU/L 74  60  67   Total Bilirubin 0.0 - 1.2 mg/dL 0.2  1.5  0.3   Bilirubin, Direct 0.0 - 0.2 mg/dL  0.5        Current Medications:   Current Outpatient Medications (Endocrine & Metabolic):    metFORMIN  (GLUCOPHAGE ) 500 MG tablet, Take 1.5 tablets (750 mg total) by mouth 2 (two) times daily with a meal.  Current Outpatient Medications (Cardiovascular):  atorvastatin  (LIPITOR) 20 MG tablet, Take 1 tablet (20 mg total) by mouth daily.   losartan  (COZAAR ) 25 MG tablet, Take 1 tablet (25 mg total) by mouth daily.    Current Outpatient Medications (Hematological):    FEROSUL 325 (65 Fe) MG tablet, TAKE ONE TABLET BY MOUTH DAILY  Current Outpatient Medications (Other):    Accu-Chek FastClix Lancets MISC, Use as instructed. Check blood glucose level by fingerstick twice per day.   ARIPiprazole  ER (ABILIFY  MAINTENA) 400 MG SRER injection, Inject 2 mLs (400 mg total) into the muscle every 28 (twenty-eight) days. Due 8/25   benztropine  (COGENTIN ) 1 MG tablet, Take 1 mg by mouth 2 (two) times daily.   Blood Glucose Monitoring Suppl (ACCU-CHEK GUIDE ME) w/Device KIT, Use to check blood sugar twice per day   glucose blood (ACCU-CHEK GUIDE) test strip, Use as instructed. Check blood glucose level by fingerstick twice per day.   Lancets Misc. (ACCU-CHEK FASTCLIX LANCET) KIT, Use as instructed. Check blood glucose level by fingerstick  twice per day.   LORazepam  (ATIVAN ) 1 MG tablet, lorazepam  1 mg tablet   diclofenac  Sodium (VOLTAREN ) 1 % GEL, Apply 4 g topically 4 (four) times daily. (Patient not taking: Reported on 12/05/2023)   hydrOXYzine  (ATARAX ) 50 MG tablet, Take 50 mg by mouth 3 (three) times daily as needed. (Patient not taking: Reported on 12/05/2023)   hydrOXYzine  (VISTARIL ) 50 MG capsule, Take 50 mg by mouth 3 (three) times daily. (Patient not taking: Reported on 12/05/2023)  Medical History:  Past Medical History:  Diagnosis Date   Anxiety    Bipolar affective (HCC)    Depression    Diabetes mellitus without complication (HCC)    Hyperlipidemia    Hypertension    OCD (obsessive compulsive disorder)    Schizoaffective disorder (HCC) 2000   Allergies:  Allergies  Allergen Reactions   Geodon  [Ziprasidone ] Anaphylaxis and Other (See Comments)    EPS symptoms   Haldol  [Haloperidol ] Other (See Comments)    Lock-jaw, EPS symptoms   Risperidone  And Paliperidone  Other (See Comments)    EPS symptoms   Valproic Acid  Other (See Comments)    Reaction unknown     Surgical History:  She  has a past surgical history that includes no past surgery. Family History:  Her family history includes Diabetes in her mother; Drug abuse in her father; Heart attack in her father; Hyperthyroidism in her mother; Mental illness in an other family member.  REVIEW OF SYSTEMS  : All other systems reviewed and negative except where noted in the History of Present Illness.  PHYSICAL EXAM: BP 122/78   Pulse 100   Ht 5' 3 (1.6 m)   Wt 219 lb (99.3 kg)   BMI 38.79 kg/m  Physical Exam   GENERAL APPEARANCE: Well nourished, in no apparent distress HEENT: No cervical lymphadenopathy, unremarkable thyroid , sclerae anicteric, conjunctiva pink RESPIRATORY: Respiratory effort normal, breath sounds equal bilaterally without rales, rhonchi, or wheezing. Lungs clear to auscultation bilaterally. CARDIO: Regular rate and rhythm with no  murmurs, rubs, or gallops, peripheral pulses intact ABDOMEN: Soft, non-distended, active bowel sounds in all four quadrants, no tenderness to palpation, no rebound, no mass appreciated RECTAL: Declines MUSCULOSKELETAL: Full range of motion, normal gait, without edema SKIN: Dry, intact without rashes or lesions. No jaundice. NEURO: Alert, oriented, no focal deficits PSYCH: Cooperative, normal mood and affect. EXTREMITIES: No rashes on extremities.      Barbara JONELLE Coombs, [name]-C 11:41 AM

## 2023-12-04 NOTE — Progress Notes (Signed)
 Assessment & Plan:  [name] was seen today for hypertension and diabetes.  Diagnoses and all orders for this visit:  Primary hypertension -     losartan  (COZAAR ) 25 MG tablet; Take 1 tablet (25 mg total) by mouth daily. Well-controlled with normal blood pressure readings. On antihypertensive medication with diuretic effect. - Advise increased water  intake to compensate for diuretic effect.  Type 2 diabetes mellitus with hyperglycemia, unspecified whether long term insulin use (HCC) -     Urine Albumin/Creatinine with ratio (send out) [LAB689] -     POCT glycosylated hemoglobin (Hb A1C) -     CBC with Differential/Platelet -     CMP14+EGFR -     losartan  (COZAAR ) 25 MG tablet; Take 1 tablet (25 mg total) by mouth daily. Improved glycemic control. Hemoglobin A1c decreased to 6.4%, within prediabetes range. - Encourage regular physical activity, such as walking. - Advise increased water  intake, at least 80 ounces per day.  Dyslipidemia, goal LDL below 70 -     atorvastatin  (LIPITOR) 20 MG tablet; Take 1 tablet (20 mg total) by mouth daily. INSTRUCTIONS: Work on a low fat, heart healthy diet and participate in regular aerobic exercise program by working out at least 150 minutes per week; 5 days a week-30 minutes per day. Avoid red meat/beef/steak,  fried foods. junk foods, sodas, sugary drinks, unhealthy snacking, alcohol and smoking.  Drink at least 80 oz of water  per day and monitor your carbohydrate intake daily.      Patient has been counseled on age-appropriate routine health concerns for screening and prevention. These are reviewed and up-to-date. Referrals have been placed accordingly. Immunizations are up-to-date or declined.    Subjective:   Chief Complaint  Patient presents with   Hypertension   Diabetes    [name] 48 y.o. female presents to office today for DM and HTN f/U   She has a history of prediabetes, dyslipidemia and bipolar disorder, OCD and  schizoaffective disorder.  She is currently followed by a mental health action team which helps with her compliance with medications.  She denies any acute issues at today's visit. She receives her medications in a pill pack from the [employer] .    DM 2 She is currently taking metformin  750 mg twice daily. Her A1c levels have improved, decreasing from 7.4% to 6.4%. A1c at goal of less than 7.   Lab Results  Component Value Date   HGBA1C 6.4 12/04/2023     HTN Blood pressure at goal with losartan  25 mg daily.  BP Readings from Last 3 Encounters:  12/04/23 124/85  09/25/23 (!) 116/98  09/12/23 114/74     She recently underwent a colonoscopy that revealed diverticulosis. She is actively trying to improve her diet and we discussed the use of probiotics to enhance gut health.  She has not been able to join the [facility] for exercise as planned because her friend, who was her accountability partner, stopped attending.   Review of Systems  Constitutional:  Negative for fever, malaise/fatigue and weight loss.  HENT: Negative.  Negative for nosebleeds.   Eyes: Negative.  Negative for blurred vision, double vision and photophobia.  Respiratory: Negative.  Negative for cough and shortness of breath.   Cardiovascular: Negative.  Negative for chest pain, palpitations and leg swelling.  Gastrointestinal: Negative.  Negative for heartburn, nausea and vomiting.  Musculoskeletal: Negative.  Negative for myalgias.  Neurological: Negative.  Negative for dizziness, focal weakness, seizures and headaches.  Psychiatric/Behavioral:  Negative.  Negative for suicidal ideas.     Past Medical History:  Diagnosis Date   Anxiety    Bipolar affective (HCC)    Depression    Diabetes mellitus without complication (HCC)    Hyperlipidemia    Hypertension    OCD (obsessive compulsive disorder)    Schizoaffective disorder (HCC) 2000    Past Surgical History:  Procedure Laterality Date   no past  surgery      Family History  Problem Relation Age of Onset   Hyperthyroidism Mother    Diabetes Mother    Drug abuse Father    Heart attack Father    Mental illness Other    Breast cancer Neg Hx    Colon cancer Neg Hx    Colon polyps Neg Hx    Esophageal cancer Neg Hx    Rectal cancer Neg Hx    Stomach cancer Neg Hx     Social History Reviewed with no changes to be made today.   Outpatient Medications Prior to Visit  Medication Sig Dispense Refill   Accu-Chek FastClix Lancets MISC Use as instructed. Check blood glucose level by fingerstick twice per day. 100 each 3   ARIPiprazole  ER (ABILIFY  MAINTENA) 400 MG SRER injection Inject 2 mLs (400 mg total) into the muscle every 28 (twenty-eight) days. Due 8/25 1 each 11   benztropine  (COGENTIN ) 1 MG tablet Take 1 mg by mouth 2 (two) times daily.     Blood Glucose Monitoring Suppl (ACCU-CHEK GUIDE ME) w/Device KIT Use to check blood sugar twice per day 1 kit 0   FEROSUL 325 (65 Fe) MG tablet TAKE ONE TABLET BY MOUTH DAILY 90 tablet 10   glucose blood (ACCU-CHEK GUIDE) test strip Use as instructed. Check blood glucose level by fingerstick twice per day. 100 each 12   hydrOXYzine  (ATARAX ) 50 MG tablet Take 50 mg by mouth 3 (three) times daily as needed.     hydrOXYzine  (VISTARIL ) 50 MG capsule Take 50 mg by mouth 3 (three) times daily.     Lancets Misc. (ACCU-CHEK FASTCLIX LANCET) KIT Use as instructed. Check blood glucose level by fingerstick twice per day. 1 kit 11   LORazepam  (ATIVAN ) 1 MG tablet lorazepam  1 mg tablet     metFORMIN  (GLUCOPHAGE ) 500 MG tablet Take 1.5 tablets (750 mg total) by mouth 2 (two) times daily with a meal. 270 tablet 0   atorvastatin  (LIPITOR) 20 MG tablet Take 1 tablet (20 mg total) by mouth daily. 90 tablet 0   losartan  (COZAAR ) 25 MG tablet Take 1 tablet (25 mg total) by mouth daily. 90 tablet 0   diclofenac  Sodium (VOLTAREN ) 1 % GEL Apply 4 g topically 4 (four) times daily. 150 g 0   No  facility-administered medications prior to visit.    Allergies  Allergen Reactions   Geodon  [Ziprasidone ] Anaphylaxis and Other (See Comments)    EPS symptoms   Haldol  [Haloperidol ] Other (See Comments)    Lock-jaw, EPS symptoms   Risperidone  And Paliperidone  Other (See Comments)    EPS symptoms   Valproic Acid  Other (See Comments)    Reaction unknown       Objective:    BP 124/85 (BP Location: Left Arm, Patient Position: Sitting, Cuff Size: Normal)   Pulse 79   Ht 5' 3 (1.6 m)   Wt 221 lb (100.2 kg)   SpO2 100%   BMI 39.15 kg/m  Wt Readings from Last 3 Encounters:  12/04/23 221 lb (100.2 kg)  09/25/23  214 lb (97.1 kg)  09/12/23 215 lb (97.5 kg)    Physical Exam Vitals and nursing note reviewed.  Constitutional:      Appearance: She is well-developed.  HENT:     Head: Normocephalic and atraumatic.  Cardiovascular:     Rate and Rhythm: Normal rate and regular rhythm.     Heart sounds: Normal heart sounds. No murmur heard.    No friction rub. No gallop.  Pulmonary:     Effort: Pulmonary effort is normal. No tachypnea or respiratory distress.     Breath sounds: Normal breath sounds. No decreased breath sounds, wheezing, rhonchi or rales.  Chest:     Chest wall: No tenderness.  Musculoskeletal:        General: Normal range of motion.     Cervical back: Normal range of motion.  Skin:    General: Skin is warm and dry.  Neurological:     Mental Status: She is alert and oriented to person, place, and time.     Coordination: Coordination normal.  Psychiatric:        Behavior: Behavior normal. Behavior is cooperative.        Thought Content: Thought content normal.        Judgment: Judgment normal.          Patient has been counseled extensively about nutrition and exercise as well as the importance of adherence with medications and regular follow-up. The patient was given clear instructions to go to ER or return to medical center if symptoms don't improve,  worsen or new problems develop. The patient verbalized understanding.   Follow-up: Return in about 6 months (around 06/02/2024).   Haze LELON Servant, FNP-BC [facility] and [facility] [place], [place] [phone]   12/04/2023, 10:48 AM

## 2023-12-05 ENCOUNTER — Encounter: Payer: Self-pay | Admitting: Physician Assistant

## 2023-12-05 ENCOUNTER — Ambulatory Visit: Payer: Self-pay | Admitting: Nurse Practitioner

## 2023-12-05 ENCOUNTER — Ambulatory Visit (INDEPENDENT_AMBULATORY_CARE_PROVIDER_SITE_OTHER): Admitting: Physician Assistant

## 2023-12-05 ENCOUNTER — Other Ambulatory Visit

## 2023-12-05 ENCOUNTER — Ambulatory Visit

## 2023-12-05 VITALS — BP 122/78 | HR 100 | Ht 63.0 in | Wt 219.0 lb

## 2023-12-05 DIAGNOSIS — D500 Iron deficiency anemia secondary to blood loss (chronic): Secondary | ICD-10-CM | POA: Diagnosis not present

## 2023-12-05 DIAGNOSIS — D509 Iron deficiency anemia, unspecified: Secondary | ICD-10-CM

## 2023-12-05 DIAGNOSIS — N289 Disorder of kidney and ureter, unspecified: Secondary | ICD-10-CM

## 2023-12-05 DIAGNOSIS — K529 Noninfective gastroenteritis and colitis, unspecified: Secondary | ICD-10-CM

## 2023-12-05 DIAGNOSIS — N2889 Other specified disorders of kidney and ureter: Secondary | ICD-10-CM

## 2023-12-05 DIAGNOSIS — Z1211 Encounter for screening for malignant neoplasm of colon: Secondary | ICD-10-CM | POA: Diagnosis not present

## 2023-12-05 DIAGNOSIS — K219 Gastro-esophageal reflux disease without esophagitis: Secondary | ICD-10-CM

## 2023-12-05 NOTE — Patient Instructions (Signed)
 _______________________________________________________  If your blood pressure at your visit was 140/90 or greater, please contact your primary care physician to follow up on this.  _______________________________________________________  If you are age 46 or older, your body mass index should be between 23-30. Your Body mass index is 38.79 kg/m. If this is out of the aforementioned range listed, please consider follow up with your Primary Care Provider.  If you are age 63 or younger, your body mass index should be between 19-25. Your Body mass index is 38.79 kg/m. If this is out of the aformentioned range listed, please consider follow up with your Primary Care Provider.   ________________________________________________________  The [facility] providers would like to encourage you to use MYCHART to communicate with providers for non-urgent requests or questions.  Due to long hold times on the telephone, sending your provider a message by [facility] may be a faster and more efficient way to get a response.  Please allow 48 business hours for a response.  Please remember that this is for non-urgent requests.  _______________________________________________________  [facility] is using a team-based approach to care.  Your team is made up of your doctor and two to three APPS. Our APPS (Nurse Practitioners and Physician Assistants) work with your physician to ensure care continuity for you. They are fully qualified to address your health concerns and develop a treatment plan. They communicate directly with your gastroenterologist to care for you. Seeing the Advanced Practice Practitioners on your physician's team can help you by facilitating care more promptly, often allowing for earlier appointments, access to diagnostic testing, procedures, and other specialty referrals.   Your provider has requested that you go to the basement level for lab work before leaving today. Press B on the  elevator. The lab is located at the first door on the left as you exit the elevator.  You have been scheduled for an MRI at [facility] on 12-08-23. Your appointment time is 10:30am. Please arrive 15 minutes prior to your appointment time for registration purposes. Please make certain not to have anything to eat or drink 4 hours prior to your test. In addition, if you have any metal in your body, have a pacemaker or defibrillator, please be sure to let your ordering physician know. This test typically takes 45 minutes to 1 hour to complete. Should you need to reschedule, please call [phone] to do so.  Due to recent changes in healthcare laws, you may see the results of your imaging and laboratory studies on MyChart before your provider has had a chance to review them.  We understand that in some cases there may be results that are confusing or concerning to you. Not all laboratory results come back in the same time frame and the provider may be waiting for multiple results in order to interpret others.  Please give us  48 hours in order for your provider to thoroughly review all the results before contacting the office for clarification of your results.   VISIT SUMMARY:  You came in today for a follow-up after your recent colonoscopy, which showed diverticulosis, internal hemorrhoids, and inflammation in the sigmoid and descending colon. Biopsies taken during the procedure were negative for cancer or dysplasia. You reported feeling fine post-procedure with no significant changes in your bowel habits or other concerning symptoms.  YOUR PLAN:  -COLONIC INFLAMMATION OF SIGMOID AND DESCENDING COLON: You have chronic active inflammation in the sigmoid and descending colon. This means there is ongoing inflammation in these parts of  your colon. The biopsies did not show any cancer or precancerous changes. To further investigate the cause, which could be an early-stage autoimmune condition like  Crohn's disease, we will conduct a fecal calprotectin test. You will receive a stool collection kit for this test.  -DIVERTICULOSIS OF COLON: Diverticulosis means there are small pouches in the walls of your colon. These pouches are not causing any symptoms right now, so no treatment is needed at this time.  -INTERNAL HEMORRHOIDS: Internal hemorrhoids are swollen veins inside your rectum. They are not causing any symptoms right now, so no treatment is needed at this time.  INSTRUCTIONS:  Please complete the fecal calprotectin test using the stool collection kit provided. Follow the instructions included with the kit and return the sample as directed. We will contact you with the results and any further steps based on those results.  Thank you for entrusting me with your care and choosing [facility].  Alan Coombs, PA-C

## 2023-12-06 ENCOUNTER — Other Ambulatory Visit

## 2023-12-06 DIAGNOSIS — D509 Iron deficiency anemia, unspecified: Secondary | ICD-10-CM | POA: Diagnosis not present

## 2023-12-06 DIAGNOSIS — K219 Gastro-esophageal reflux disease without esophagitis: Secondary | ICD-10-CM | POA: Diagnosis not present

## 2023-12-06 DIAGNOSIS — K529 Noninfective gastroenteritis and colitis, unspecified: Secondary | ICD-10-CM

## 2023-12-06 LAB — CBC WITH DIFFERENTIAL/PLATELET
Basophils Absolute: 0 x10E3/uL (ref 0.0–0.2)
Basos: 0 %
EOS (ABSOLUTE): 0.1 x10E3/uL (ref 0.0–0.4)
Eos: 2 %
Hematocrit: 35.8 % (ref 34.0–46.6)
Hemoglobin: 11.4 g/dL (ref 11.1–15.9)
Immature Grans (Abs): 0 x10E3/uL (ref 0.0–0.1)
Immature Granulocytes: 0 %
Lymphocytes Absolute: 2.6 x10E3/uL (ref 0.7–3.1)
Lymphs: 30 %
MCH: 27.5 pg (ref 26.6–33.0)
MCHC: 31.8 g/dL (ref 31.5–35.7)
MCV: 87 fL (ref 79–97)
Monocytes Absolute: 0.6 x10E3/uL (ref 0.1–0.9)
Monocytes: 7 %
Neutrophils Absolute: 5.3 x10E3/uL (ref 1.4–7.0)
Neutrophils: 61 %
Platelets: 371 x10E3/uL (ref 150–450)
RBC: 4.14 x10E6/uL (ref 3.77–5.28)
RDW: 15.4 % (ref 11.7–15.4)
WBC: 8.6 x10E3/uL (ref 3.4–10.8)

## 2023-12-06 LAB — CMP14+EGFR
ALT: 14 IU/L (ref 0–32)
AST: 14 IU/L (ref 0–40)
Albumin: 4.4 g/dL (ref 3.9–4.9)
Alkaline Phosphatase: 74 IU/L (ref 41–116)
BUN/Creatinine Ratio: 16 (ref 9–23)
BUN: 17 mg/dL (ref 6–24)
Bilirubin Total: 0.2 mg/dL (ref 0.0–1.2)
CO2: 16 mmol/L — ABNORMAL LOW (ref 20–29)
Calcium: 9.7 mg/dL (ref 8.7–10.2)
Chloride: 103 mmol/L (ref 96–106)
Creatinine, Ser: 1.08 mg/dL — ABNORMAL HIGH (ref 0.57–1.00)
Globulin, Total: 3.6 g/dL (ref 1.5–4.5)
Glucose: 144 mg/dL — ABNORMAL HIGH (ref 70–99)
Potassium: 4.9 mmol/L (ref 3.5–5.2)
Sodium: 136 mmol/L (ref 134–144)
Total Protein: 8 g/dL (ref 6.0–8.5)
eGFR: 64 mL/min/1.73 (ref >59)

## 2023-12-06 LAB — MICROALBUMIN / CREATININE URINE RATIO
Creatinine, Urine: 196.5 mg/dL
Microalb/Creat Ratio: 6 mg/g{creat} (ref 0–29)
Microalbumin, Urine: 11.7 ug/mL

## 2023-12-07 ENCOUNTER — Ambulatory Visit: Payer: Self-pay | Admitting: Physician Assistant

## 2023-12-07 LAB — HELICOBACTER PYLORI  SPECIAL ANTIGEN
MICRO NUMBER:: 17042271
RESULT:: DETECTED — AB
SPECIMEN QUALITY: ADEQUATE

## 2023-12-08 ENCOUNTER — Ambulatory Visit (HOSPITAL_BASED_OUTPATIENT_CLINIC_OR_DEPARTMENT_OTHER)

## 2023-12-08 LAB — CALPROTECTIN, FECAL: Calprotectin, Fecal: 288 ug/g — ABNORMAL HIGH (ref 0–120)

## 2023-12-08 MED ORDER — DOXYCYCLINE HYCLATE 100 MG PO CAPS: 100.0000 mg | ORAL_CAPSULE | Freq: Two times a day (BID) | ORAL | 0 refills | Status: AC

## 2023-12-08 MED ORDER — METRONIDAZOLE 500 MG PO TABS: 500.0000 mg | ORAL_TABLET | Freq: Two times a day (BID) | ORAL | 0 refills | Status: AC

## 2023-12-08 MED ORDER — PEPTO-BISMOL 262 MG PO TABS: 2.0000 | ORAL_TABLET | Freq: Four times a day (QID) | ORAL | 0 refills | Status: AC

## 2023-12-08 MED ORDER — PANTOPRAZOLE SODIUM 40 MG PO TBEC: 40.0000 mg | DELAYED_RELEASE_TABLET | Freq: Two times a day (BID) | ORAL | 0 refills | Status: AC

## 2023-12-14 ENCOUNTER — Ambulatory Visit (HOSPITAL_COMMUNITY)
Admission: RE | Admit: 2023-12-14 | Discharge: 2023-12-14 | Disposition: A | Discharge status: Home / Self Care | Source: Ambulatory Visit | Attending: Physician Assistant | Admitting: Physician Assistant

## 2023-12-14 DIAGNOSIS — N2889 Other specified disorders of kidney and ureter: Secondary | ICD-10-CM | POA: Diagnosis not present

## 2023-12-14 DIAGNOSIS — K760 Fatty (change of) liver, not elsewhere classified: Secondary | ICD-10-CM | POA: Diagnosis not present

## 2023-12-14 DIAGNOSIS — N281 Cyst of kidney, acquired: Secondary | ICD-10-CM | POA: Diagnosis not present

## 2023-12-14 MED ORDER — GADOBUTROL 1 MMOL/ML IV SOLN
10.0000 mL | Freq: Once | INTRAVENOUS | Status: AC | PRN
  Administered 2023-12-14: 10 mL via INTRAVENOUS

## 2023-12-20 NOTE — Telephone Encounter (Signed)
 Patient informed of results of MRI.  Patient agreed and verbalized understanding.  No further questions or concerns.

## 2024-01-01 ENCOUNTER — Other Ambulatory Visit: Payer: Self-pay | Admitting: Nurse Practitioner

## 2024-01-01 DIAGNOSIS — D649 Anemia, unspecified: Secondary | ICD-10-CM

## 2024-01-01 DIAGNOSIS — R921 Mammographic calcification found on diagnostic imaging of breast: Secondary | ICD-10-CM

## 2024-01-01 DIAGNOSIS — E1165 Type 2 diabetes mellitus with hyperglycemia: Secondary | ICD-10-CM

## 2024-01-01 DIAGNOSIS — E782 Mixed hyperlipidemia: Secondary | ICD-10-CM

## 2024-01-01 DIAGNOSIS — Z1211 Encounter for screening for malignant neoplasm of colon: Secondary | ICD-10-CM

## 2024-01-04 ENCOUNTER — Encounter

## 2024-01-08 ENCOUNTER — Ambulatory Visit (HOSPITAL_COMMUNITY)
Admission: EM | Admit: 2024-01-08 | Discharge: 2024-01-10 | Disposition: A | Discharge status: Other Acute Inpatient Hospital | Attending: Psychiatry | Admitting: Psychiatry

## 2024-01-08 DIAGNOSIS — Z7984 Long term (current) use of oral hypoglycemic drugs: Secondary | ICD-10-CM | POA: Insufficient documentation

## 2024-01-08 DIAGNOSIS — F259 Schizoaffective disorder, unspecified: Secondary | ICD-10-CM | POA: Diagnosis present

## 2024-01-08 DIAGNOSIS — F200 Paranoid schizophrenia: Secondary | ICD-10-CM | POA: Diagnosis present

## 2024-01-08 DIAGNOSIS — Z91148 Patient's other noncompliance with medication regimen for other reason: Secondary | ICD-10-CM | POA: Insufficient documentation

## 2024-01-08 DIAGNOSIS — Z79899 Other long term (current) drug therapy: Secondary | ICD-10-CM | POA: Insufficient documentation

## 2024-01-08 DIAGNOSIS — R410 Disorientation, unspecified: Secondary | ICD-10-CM | POA: Diagnosis not present

## 2024-01-08 LAB — CBC WITH DIFFERENTIAL/PLATELET
Abs Immature Granulocytes: 0.02 K/uL (ref 0.00–0.07)
Basophils Absolute: 0 K/uL (ref 0.0–0.1)
Basophils Relative: 0 %
Eosinophils Absolute: 0.2 K/uL (ref 0.0–0.5)
Eosinophils Relative: 2 %
HCT: 37.2 % (ref 36.0–46.0)
Hemoglobin: 12.4 g/dL (ref 12.0–15.0)
Immature Granulocytes: 0 %
Lymphocytes Relative: 26 %
Lymphs Abs: 1.9 K/uL (ref 0.7–4.0)
MCH: 28 pg (ref 26.0–34.0)
MCHC: 33.3 g/dL (ref 30.0–36.0)
MCV: 84 fL (ref 80.0–100.0)
Monocytes Absolute: 0.7 K/uL (ref 0.1–1.0)
Monocytes Relative: 9 %
Neutro Abs: 4.6 K/uL (ref 1.7–7.7)
Neutrophils Relative %: 63 %
Platelets: 335 K/uL (ref 150–400)
RBC: 4.43 MIL/uL (ref 3.87–5.11)
RDW: 15.5 % (ref 11.5–15.5)
WBC: 7.5 K/uL (ref 4.0–10.5)
nRBC: 0 % (ref 0.0–0.2)

## 2024-01-08 LAB — LIPID PANEL
Cholesterol: 154 mg/dL (ref 0–200)
HDL: 30 mg/dL — ABNORMAL LOW (ref >40)
LDL Cholesterol: 91 mg/dL (ref 0–99)
Total CHOL/HDL Ratio: 5.1 ratio
Triglycerides: 164 mg/dL — ABNORMAL HIGH (ref <150)
VLDL: 33 mg/dL (ref 0–40)

## 2024-01-08 LAB — COMPREHENSIVE METABOLIC PANEL WITH GFR
ALT: 20 U/L (ref 0–44)
AST: 29 U/L (ref 15–41)
Albumin: 4.2 g/dL (ref 3.5–5.0)
Alkaline Phosphatase: 55 U/L (ref 38–126)
Anion gap: 14 (ref 5–15)
BUN: 31 mg/dL — ABNORMAL HIGH (ref 6–20)
CO2: 18 mmol/L — ABNORMAL LOW (ref 22–32)
Calcium: 9.1 mg/dL (ref 8.9–10.3)
Chloride: 102 mmol/L (ref 98–111)
Creatinine, Ser: 1.6 mg/dL — ABNORMAL HIGH (ref 0.44–1.00)
GFR, Estimated: 40 mL/min — ABNORMAL LOW (ref >60)
Glucose, Bld: 215 mg/dL — ABNORMAL HIGH (ref 70–99)
Potassium: 3.7 mmol/L (ref 3.5–5.1)
Sodium: 134 mmol/L — ABNORMAL LOW (ref 135–145)
Total Bilirubin: 0.7 mg/dL (ref 0.0–1.2)
Total Protein: 8.8 g/dL — ABNORMAL HIGH (ref 6.5–8.1)

## 2024-01-08 LAB — HEMOGLOBIN A1C
Hgb A1c MFr Bld: 6.6 % — ABNORMAL HIGH (ref 4.8–5.6)
Mean Plasma Glucose: 142.72 mg/dL

## 2024-01-08 LAB — TSH: TSH: 1.668 u[IU]/mL (ref 0.350–4.500)

## 2024-01-08 LAB — ETHANOL: Alcohol, Ethyl (B): <15 mg/dL (ref <15)

## 2024-01-08 LAB — MAGNESIUM: Magnesium: 2.1 mg/dL (ref 1.7–2.4)

## 2024-01-08 MED ORDER — OLANZAPINE 5 MG PO TBDP
5.0000 mg | ORAL_TABLET | Freq: Three times a day (TID) | ORAL | Status: DC | PRN
  Administered 2024-01-10: 5 mg via ORAL
  Filled 2024-01-08 (×3): qty 1

## 2024-01-08 MED ORDER — OLANZAPINE 10 MG IM SOLR
10.0000 mg | Freq: Three times a day (TID) | INTRAMUSCULAR | Status: DC | PRN
  Administered 2024-01-09: 10 mg via INTRAMUSCULAR
  Filled 2024-01-08: qty 10

## 2024-01-08 MED ORDER — OLANZAPINE 10 MG IM SOLR: 5.0000 mg | Freq: Three times a day (TID) | INTRAMUSCULAR | Status: DC | PRN

## 2024-01-08 MED ORDER — ALUM & MAG HYDROXIDE-SIMETH 200-200-20 MG/5ML PO SUSP
30.0000 mL | ORAL | Status: DC | PRN
  Administered 2024-01-10: 30 mL via ORAL
  Filled 2024-01-08: qty 30

## 2024-01-08 MED ORDER — MAGNESIUM HYDROXIDE 400 MG/5ML PO SUSP: 30.0000 mL | Freq: Every day | ORAL | Status: DC | PRN

## 2024-01-08 MED ORDER — ACETAMINOPHEN 325 MG PO TABS: 650.0000 mg | ORAL_TABLET | Freq: Four times a day (QID) | ORAL | Status: DC | PRN

## 2024-01-08 NOTE — Progress Notes (Signed)
   01/08/24 1407  [facility] Triage Screening (Walk-ins at [facility] only)  How Did You Hear About Us ? Legal System  What Is the Reason for Your Visit/Call Today? Pt presents to [facility] involuntarily and unaccompanied. Pt deneis SI, HI, AVH/Drug and Alcohol use.  Pt IVC's reads  Respondent is diagnosed with schizoaffective disorder.  Is prescribed medication which she is not taking.  Today stated to her mental health provider that she took 6 Tylenol  pills at once and claimed her doctor had prescribed them which he did not.  she also claimed to take robintussin recently.  Respondent is very paranoid, claims there are people out to get her.  Is currently under the influcence of unknown substances.  Is not aware of her environment or surrundings.  Respondent not tending  to hygiene.  Respondent has been committed prevously.  Is a danger to herself.  Pt admits to MH diagnosis or prescribed medicaton for symptom management.  How Long Has This Been Causing You Problems? 1 wk - 1 month  Have You Recently Had Any Thoughts About Hurting Yourself? No (Per IVC Today stated to her mental health provider that she took 6 Tylenol  pills at once and claimed her doctor had prescribed them which he did not. she also claimed to take robintussin recently)  Are You Planning to Commit Suicide/Harm Yourself At This time? No  Have you Recently Had Thoughts About Hurting Someone Sherral? No  Are You Planning To Harm Someone At This Time? No  Physical Abuse Denies  Verbal Abuse Denies  Sexual Abuse Denies  Exploitation of patient/patient's resources Denies  Self-Neglect Denies  Possible abuse reported to: Other (Comment)  Are you currently experiencing any auditory, visual or other hallucinations? No  Have You Used Any Alcohol or Drugs in the Past 24 Hours? No  Do you have any current medical co-morbidities that require immediate attention? No  Clinician description of patient physical appearance/behavior: anxious  What Do You Feel Would  Help You the Most Today? Treatment for Depression or other mood problem  If access to [facility] was not available, would you have sought care in the Emergency Department? Yes  Determination of Need Urgent (48 hours)  Options For Referral Facility-Based Crisis  Determination of Need filed? Yes

## 2024-01-08 NOTE — ED Provider Notes (Signed)
 [facility] Urgent Care Continuous Assessment Admission H&P  Date: 01/08/24 Patient Name: [name] MRN: [id number] Chief Complaint: my case worker sent me here  Diagnoses:  Final diagnoses:  Schizoaffective disorder, unspecified type [facility])    HPI: [name] is a 48 year-old female who presents to [facility] under involuntary commitment stating: Respondent is diagnosed with Schizoaffective Disorder. Is prescribed medication which she is not taking. Today stated to her mental health provider that she took 6 Tylenol  pills at once and claimed her doctor has prescribed them which he did not. She also claimed to take Robitussin recently. Respondent is very paranoid, claims there are people out to get her. Is currently under the influence of unknown substances. Is not aware of her environment or surroundings. Respondent not tending to hygiene. Respondent has been committed previously. Is a danger to herself  Patient's mental health provider Erminio Cone [phone] is contacted and reports that patient has been refusing to take her medications. When provider visited today, she noticed that patient's thought process was altered and noted that she was decompensating. Patient has ACTT services through [facility] wit Dr Malinda. Patient's hygiene is decreased. She is very disorganized and paranoid.    Assessment: [name] is a 48 year -old female sitting in the assessment room alone. She is restless and labile, frequently standing and coming out of the room, intrusive with childlike behaviors. She is alert but disoriented to situations, stating that she does not know why she is here, that her caregiver sent her here. She denies having a mental illness but later states that she used to take medications for her mind.  Her speech volume is increased. Patient appears to be responsive to internal stimuli as evidenced by her inappropriate laughing throughout this encounter.  Patient keeps   staring at this clinician, and laughing. When asked what makes her laugh, patient states it is what I am told. She denies SI/HI. Admits to hx of taking medications but has refused to take them for a couple of months.  Patient denies taking any pills prior to coming here. She does admit to having ACTT services and Dr Malinda is her psychiatrist.  Patient becomes more and more agitated during this assessment and asks provider to get out that's too many questions.  Per med review, the most recent documented home medications include Abilify  Maintena, Benztropin, Klonopin , and Lorazepam . Medications to be confirmed with the ACTT nurse.    Total Time spent with patient: 45 minutes  Musculoskeletal  Strength & Muscle Tone: within normal limits Gait & Station: normal Patient leans: N/A  Psychiatric Specialty Exam  Presentation General Appearance:  Bizarre  Eye Contact: Fleeting  Speech: Clear and Coherent  Speech Volume: Increased  Handedness: Right   Mood and Affect  Mood: Anxious; Labile  Affect: Labile   Thought Process  Thought Processes: Disorganized; Irrevelant  Descriptions of Associations:Tangential  Orientation:Partial  Thought Content:Obsessions; Tangential    Hallucinations:Hallucinations: None  Ideas of Reference:None  Suicidal Thoughts:Suicidal Thoughts: No  Homicidal Thoughts:Homicidal Thoughts: No   Sensorium  Memory: Immediate Fair; Recent Fair; Remote Poor  Judgment: Impaired  Insight: Poor   Executive Functions  Concentration: Poor  Attention Span: Poor  Recall: [employer] of Knowledge: Fair  Language: Fair   Psychomotor Activity  Psychomotor Activity: Psychomotor Activity: Restlessness   Assets  Assets: Manufacturing Systems Engineer; Social Support; Physical Health   Sleep  Sleep: Sleep: Fair (I try to sleep but people keep disturbing me)   Nutritional Assessment (For  OBS and FBC admissions only) Has the  patient had a weight loss or gain of 10 pounds or more in the last 3 months?: No Has the patient had a decrease in food intake/or appetite?: No Does the patient have dental problems?: No Does the patient have eating habits or behaviors that may be indicators of an eating disorder including binging or inducing vomiting?: No Has the patient recently lost weight without trying?: 0 Has the patient been eating poorly because of a decreased appetite?: 0 Malnutrition Screening Tool Score: 0    Physical Exam Vitals and nursing note reviewed.  HENT:     Head: Normocephalic and atraumatic.     Right Ear: Tympanic membrane normal.     Left Ear: Tympanic membrane normal.     Nose: Nose normal.     Mouth/Throat:     Mouth: Mucous membranes are moist.  Eyes:     Extraocular Movements: Extraocular movements intact.     Pupils: Pupils are equal, round, and reactive to light.  Cardiovascular:     Rate and Rhythm: Normal rate.     Pulses: Normal pulses.  Pulmonary:     Effort: Pulmonary effort is normal.  Musculoskeletal:        General: Normal range of motion.     Cervical back: Normal range of motion.  Neurological:     General: No focal deficit present.     Mental Status: She is alert.    Review of Systems  Constitutional: Negative.   HENT: Negative.    Eyes: Negative.   Respiratory: Negative.    Cardiovascular: Negative.   Gastrointestinal: Negative.   Genitourinary: Negative.   Musculoskeletal: Negative.   Skin: Negative.   Neurological: Negative.   Endo/Heme/Allergies: Negative.   Psychiatric/Behavioral:  Positive for hallucinations. The patient is nervous/anxious.     There were no vitals taken for this visit. There is no height or weight on file to calculate BMI.  Past Psychiatric History: Schizoaffective Disorder, Bipolar, OCD  Is the patient at risk to self? No  Has the patient been a risk to self in the past 6 months? No .    Has the patient been a risk to self within  the distant past? No   Is the patient a risk to others? No   Has the patient been a risk to others in the past 6 months? No   Has the patient been a risk to others within the distant past? No   Past Medical History: HTN, DM, Hyperlipidemia  Family History: NA  Social History: Patient reports she lives alone. States she sees her mother occasionally. Has ACTT services [employer])  Last Labs:  Appointment on 12/06/2023  Component Date Value Ref Range Status   MICRO NUMBER: 12/06/2023 [id number]   Final   SPECIMEN QUALITY 12/06/2023 Adequate   Final   SOURCE: 12/06/2023 STOOL   Final   STATUS: 12/06/2023 FINAL   Final   RESULT: 12/06/2023 Detected (A)   Final   Reference Range:Not Detected   Calprotectin, Fecal 12/06/2023 288 (H)  0 - 120 ug/g Final   Comment: Concentration     Interpretation   Follow-Up < 5 - 50 ug/g     Normal           None >50 -120 ug/g     Borderline       Re-evaluate in 4-6 weeks     >120 ug/g     Abnormal  Repeat as clinically                                    indicated   Office Visit on 12/04/2023  Component Date Value Ref Range Status   Creatinine, Urine 12/04/2023 196.5  Not Estab. mg/dL Final   Microalbumin, Urine 12/04/2023 11.7  Not Estab. ug/mL Final   Microalb/Creat Ratio 12/04/2023 6  0 - 29 mg/g creat Final   Comment:                        Normal:                0 -  29                        Moderately increased: 30 - 300                        Severely increased:       >300    HbA1c, POC (controlled diabetic ra* 12/04/2023 6.4  0.0 - 7.0 % Final   WBC 12/04/2023 8.6  3.4 - 10.8 x10E3/uL Final   RBC 12/04/2023 4.14  3.77 - 5.28 x10E6/uL Final   Hemoglobin 12/04/2023 11.4  11.1 - 15.9 g/dL Final   Hematocrit 90/70/7974 35.8  34.0 - 46.6 % Final   MCV 12/04/2023 87  79 - 97 fL Final   MCH 12/04/2023 27.5  26.6 - 33.0 pg Final   MCHC 12/04/2023 31.8  31.5 - 35.7 g/dL Final   RDW 90/70/7974 15.4  11.7 - 15.4 % Final    Platelets 12/04/2023 371  150 - 450 x10E3/uL Final   Neutrophils 12/04/2023 61  Not Estab. % Final   Lymphs 12/04/2023 30  Not Estab. % Final   Monocytes 12/04/2023 7  Not Estab. % Final   Eos 12/04/2023 2  Not Estab. % Final   Basos 12/04/2023 0  Not Estab. % Final   Neutrophils Absolute 12/04/2023 5.3  1.4 - 7.0 x10E3/uL Final   Lymphocytes Absolute 12/04/2023 2.6  0.7 - 3.1 x10E3/uL Final   Monocytes Absolute 12/04/2023 0.6  0.1 - 0.9 x10E3/uL Final   EOS (ABSOLUTE) 12/04/2023 0.1  0.0 - 0.4 x10E3/uL Final   Basophils Absolute 12/04/2023 0.0  0.0 - 0.2 x10E3/uL Final   Immature Granulocytes 12/04/2023 0  Not Estab. % Final   Immature Grans (Abs) 12/04/2023 0.0  0.0 - 0.1 x10E3/uL Final   Glucose 12/04/2023 144 (H)  70 - 99 mg/dL Final   BUN 90/70/7974 17  6 - 24 mg/dL Final   Creatinine, Ser 12/04/2023 1.08 (H)  0.57 - 1.00 mg/dL Final   eGFR 90/70/7974 64  >59 mL/min/1.73 Final   BUN/Creatinine Ratio 12/04/2023 16  9 - 23 Final   Sodium 12/04/2023 136  134 - 144 mmol/L Final   Potassium 12/04/2023 4.9  3.5 - 5.2 mmol/L Final   Chloride 12/04/2023 103  96 - 106 mmol/L Final   CO2 12/04/2023 16 (L)  20 - 29 mmol/L Final   Calcium  12/04/2023 9.7  8.7 - 10.2 mg/dL Final   Total Protein 90/70/7974 8.0  6.0 - 8.5 g/dL Final   Albumin 90/70/7974 4.4  3.9 - 4.9 g/dL Final   Globulin, Total 12/04/2023 3.6  1.5 - 4.5 g/dL Final   Bilirubin Total 12/04/2023 0.2  0.0 - 1.2  mg/dL Final   Alkaline Phosphatase 12/04/2023 74  41 - 116 IU/L Final   AST 12/04/2023 14  0 - 40 IU/L Final   ALT 12/04/2023 14  0 - 32 IU/L Final  Admission on 09/25/2023, Discharged on 09/25/2023  Component Date Value Ref Range Status   Color, Urine 09/25/2023 YELLOW  YELLOW Final   APPearance 09/25/2023 HAZY (A)  CLEAR Final   Specific Gravity, Urine 09/25/2023 1.021  1.005 - 1.030 Final   pH 09/25/2023 5.0  5.0 - 8.0 Final   Glucose, UA 09/25/2023 NEGATIVE  NEGATIVE mg/dL Final   Hgb urine dipstick 09/25/2023  NEGATIVE  NEGATIVE Final   Bilirubin Urine 09/25/2023 NEGATIVE  NEGATIVE Final   Ketones, ur 09/25/2023 NEGATIVE  NEGATIVE mg/dL Final   Protein, ur 92/78/7974 NEGATIVE  NEGATIVE mg/dL Final   Nitrite 92/78/7974 NEGATIVE  NEGATIVE Final   Leukocytes,Ua 09/25/2023 MODERATE (A)  NEGATIVE Final   RBC / HPF 09/25/2023 0-5  0 - 5 RBC/hpf Final   WBC, UA 09/25/2023 0-5  0 - 5 WBC/hpf Final   Bacteria, UA 09/25/2023 RARE (A)  NONE SEEN Final   Squamous Epithelial / HPF 09/25/2023 11-20  0 - 5 /HPF Final   Mucus 09/25/2023 PRESENT   Final   Hyaline Casts, UA 09/25/2023 PRESENT   Final   Performed at [facility], [address], [place], [place] [address]   Preg, Serum 09/25/2023 NEGATIVE  NEGATIVE Final   Comment:        THE SENSITIVITY OF THIS METHODOLOGY IS >10 mIU/mL. Performed at [facility], [address] [place] [facility] [place], [place] [address]    Sodium 09/25/2023 133 (L)  135 - 145 mmol/L Final   Potassium 09/25/2023 5.2 (H)  3.5 - 5.1 mmol/L Final   MODERATE HEMOLYSIS   Chloride 09/25/2023 105  98 - 111 mmol/L Final   CO2 09/25/2023 20 (L)  22 - 32 mmol/L Final   Glucose, Bld 09/25/2023 145 (H)  70 - 99 mg/dL Final   Glucose reference range applies only to samples taken after fasting for at least 8 hours.   BUN 09/25/2023 16  6 - 20 mg/dL Final   Creatinine, Ser 09/25/2023 1.20 (H)  0.44 - 1.00 mg/dL Final   Calcium  09/25/2023 8.9  8.9 - 10.3 mg/dL Final   GFR, Estimated 09/25/2023 57 (L)  >60 mL/min Final   Comment: (NOTE) Calculated using the CKD-EPI Creatinine Equation (2021)    Anion gap 09/25/2023 8  5 - 15 Final   Performed at [facility], [address], [place], [place] [address]   WBC 09/25/2023 10.9 (H)  4.0 - 10.5 K/uL Final   RBC 09/25/2023 4.17  3.87 - 5.11 MIL/uL Final   Hemoglobin 09/25/2023 11.0 (L)  12.0 - 15.0 g/dL Final   HCT 92/78/7974 35.0 (L)  36.0 - 46.0 % Final   MCV 09/25/2023 83.9  80.0 - 100.0 fL Final    MCH 09/25/2023 26.4  26.0 - 34.0 pg Final   MCHC 09/25/2023 31.4  30.0 - 36.0 g/dL Final   RDW 92/78/7974 14.3  11.5 - 15.5 % Final   Platelets 09/25/2023 364  150 - 400 K/uL Final   nRBC 09/25/2023 0.0  0.0 - 0.2 % Final   Performed at [facility], [address] [place], [place] [address]   Lipase 09/25/2023 24  11 - 51 U/L Final   Comment: HEMOLYSIS AT THIS LEVEL MAY AFFECT RESULT Performed at [facility], [address] [place], [place] [address]    Total Protein 09/25/2023 8.7 (H)  6.5 - 8.1 g/dL Final   Albumin 92/78/7974 3.9  3.5 - 5.0 g/dL Final   AST 92/78/7974 34  15 - 41 U/L Final   ALT 09/25/2023 22  0 - 44 U/L Final   Alkaline Phosphatase 09/25/2023 60  38 - 126 U/L Final   Total Bilirubin 09/25/2023 1.5 (H)  0.0 - 1.2 mg/dL Final   MODERATE HEMOLYSIS   Bilirubin, Direct 09/25/2023 0.5 (H)  0.0 - 0.2 mg/dL Final   HEMOLYSIS AT THIS LEVEL MAY AFFECT RESULT   Indirect Bilirubin 09/25/2023 1.0 (H)  0.3 - 0.9 mg/dL Final   Performed at [facility], [address] [place], [place] [address]  Procedure visit on 09/12/2023  Component Date Value Ref Range Status   SURGICAL PATHOLOGY 09/12/2023    Final-Edited                   Value:SURGICAL PATHOLOGY [facility] [address] [place], [place] [address] Telephone [phone] or [phone] Fax [phone]  REPORT OF SURGICAL PATHOLOGY   Accession #: [id number] Patient Name: THAI, HEMRICK Visit # : [id number]  MRN: [id number] Physician: Federico Rosario Stagger DOB/Age 09-07-1975 (Age: 32) Gender: F Collected Date: 09/12/2023 Received Date: 09/14/2023  FINAL DIAGNOSIS       1. Surgical [P], colon, descending and sigmoid :       - CHRONIC ACTIVE COLITIS WITH MILD TO MODERATE ACTIVITY.      - NEGATIVE FOR DYSPLASIA.       DATE SIGNED OUT: 09/15/2023 ELECTRONIC SIGNATURE : Coronel Md, Misti, Sports Administrator, Electronic  Signature  MICROSCOPIC DESCRIPTION  CASE COMMENTS STAINS USED IN DIAGNOSIS: H&E    CLINICAL HISTORY  SPECIMEN(S) OBTAINED 1. Surgical [P], Colon, Descending And Sigmoid  SPECIMEN COMMENTS: 1. Special screening for malignant neoplasms, colon SPECIMEN CLINICAL INFORMATION: 1. R/O                          other Crohn's/ SCAD    Gross Description 1. Received in formalin are tan, soft tissue fragments that are submitted in toto. Number: multiple, Size: 0.2 cm smallest to 0.4 cm largest, (1B) ( TA )        Report signed out from the following location(s) . [facility] [address] [place], [place] [address] CLIA #: [id number]  [facility] [address] [place], [place] [address] CLIA #: [id number]     Allergies: Geodon  [ziprasidone ], Haldol  [haloperidol ], Risperidone  and paliperidone , and Valproic acid   Medications:  Facility Ordered Medications  Medication   acetaminophen  (TYLENOL ) tablet 650 mg   alum & mag hydroxide-simeth (MAALOX/MYLANTA) 200-200-20 MG/5ML suspension 30 mL   magnesium  hydroxide (MILK OF MAGNESIA) suspension 30 mL   OLANZapine  zydis (ZYPREXA ) disintegrating tablet 5 mg   OLANZapine  (ZYPREXA ) injection 5 mg   OLANZapine  (ZYPREXA ) injection 10 mg   PTA Medications  Medication Sig   ARIPiprazole  ER (ABILIFY  MAINTENA) 400 MG SRER injection Inject 2 mLs (400 mg total) into the muscle every 28 (twenty-eight) days. Due 8/25 (Patient taking differently: Inject 400 mg into the muscle every 28 (twenty-eight) days.)   LORazepam  (ATIVAN ) 1 MG tablet Take 1 mg by mouth 2 (two) times daily.   benztropine  (COGENTIN ) 1 MG tablet Take 1 mg by mouth 2 (two) times daily.   glucose blood (ACCU-CHEK GUIDE) test  strip Use as instructed. Check blood glucose level by fingerstick twice per day.   Lancets Misc. (ACCU-CHEK FASTCLIX LANCET) KIT Use as instructed. Check blood glucose level by fingerstick twice per day.   Accu-Chek  FastClix Lancets MISC Use as instructed. Check blood glucose level by fingerstick twice per day.   Blood Glucose Monitoring Suppl (ACCU-CHEK GUIDE ME) w/Device KIT Use to check blood sugar twice per day   FEROSUL 325 (65 Fe) MG tablet TAKE ONE TABLET BY MOUTH DAILY   metFORMIN  (GLUCOPHAGE ) 500 MG tablet Take 1.5 tablets (750 mg total) by mouth 2 (two) times daily with a meal. (Patient taking differently: Take 500 mg by mouth in the morning, at noon, and at bedtime.)   atorvastatin  (LIPITOR) 20 MG tablet Take 1 tablet (20 mg total) by mouth daily.   losartan  (COZAAR ) 25 MG tablet Take 1 tablet (25 mg total) by mouth daily.   pantoprazole (PROTONIX) 40 MG tablet Take 1 tablet (40 mg total) by mouth 2 (two) times daily before a meal for 14 days.   clonazePAM  (KLONOPIN ) 0.5 MG tablet Take 0.5 mg by mouth in the morning, at noon, and at bedtime. Take for 5 days starting on 01/04/24 until gone      Medical Decision Making  Patient presents with active psychotic symptoms including paranoid and tangential  thinking, self-care deficit, labile mood, bizarre behaviors etc. She does admit to not taking her antipsychotic medications.  Considering the above symptoms and her psychiatric history, and collateral information, and in consultation with Dr Cole, inpatient is recommended for stabilization.   Medications:  Agitation Protocol Maalox 30 ml PO Q4 PRN Milk of Magnesia 30 ml PO Daily PRN Acetaminophen  650 mg PO Q 6 PRN Home medications under review  EKG ordered  Labs: CBC, CMP, THS, A1C,     Recommendations  Based on my evaluation the patient does not appear to have an emergency medical condition.  Randall Bouquet, NP 01/08/24  3:22 PM

## 2024-01-08 NOTE — Progress Notes (Signed)
 Pt is admitted to [facility] due to paranoia. Pt is disorganized and tangential. Pt is ambulatory and is oriented to staff/unit. Pt was cooperative with labs and skin assessment. Pt is is not able to complete admission assessment due to disorganized thought process.SABRA
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
## 2024-01-08 NOTE — ED Notes (Signed)
 Pt is disorganized, unable to answer assessment questions. No noted distress. Will continue to monitor for safety

## 2024-01-09 DIAGNOSIS — F259 Schizoaffective disorder, unspecified: Secondary | ICD-10-CM | POA: Diagnosis not present

## 2024-01-09 LAB — GLUCOSE, CAPILLARY: Glucose-Capillary: 115 mg/dL — ABNORMAL HIGH (ref 70–99)

## 2024-01-09 MED ORDER — OLANZAPINE 5 MG PO TBDP
5.0000 mg | ORAL_TABLET | Freq: Once | ORAL | Status: AC
  Administered 2024-01-09: 5 mg via ORAL
  Filled 2024-01-09: qty 1

## 2024-01-09 MED ORDER — METFORMIN HCL 500 MG PO TABS
750.0000 mg | ORAL_TABLET | Freq: Once | ORAL | Status: AC
  Administered 2024-01-09: 750 mg via ORAL
  Filled 2024-01-09: qty 2

## 2024-01-09 MED ORDER — LOSARTAN POTASSIUM 50 MG PO TABS
25.0000 mg | ORAL_TABLET | Freq: Every day | ORAL | Status: DC
  Administered 2024-01-09 – 2024-01-10 (×2): 25 mg via ORAL
  Filled 2024-01-09 (×2): qty 1

## 2024-01-09 MED ORDER — ATORVASTATIN CALCIUM 10 MG PO TABS
20.0000 mg | ORAL_TABLET | Freq: Every day | ORAL | Status: DC
  Administered 2024-01-09 – 2024-01-10 (×2): 20 mg via ORAL
  Filled 2024-01-09 (×2): qty 2

## 2024-01-09 NOTE — ED Notes (Signed)
 Pt sleeping at this time. Rise and fall of chest noted. Pt in NAD at this time. Will continue to monitor.

## 2024-01-09 NOTE — ED Provider Notes (Signed)
 FBC/OBS ASAP Discharge Summary  Date and Time: 01/09/2024 12:38 PM  Name: [name]  MRN:  [id number]   Discharge Diagnoses:  Final diagnoses:  Schizoaffective disorder, unspecified type [facility])    Subjective:   [name] is a 48 year-old female who presented to [facility] under involuntary commitment stating: Respondent is diagnosed with Schizoaffective Disorder. Is prescribed medication which she is not taking. Today stated to her mental health provider that she took 6 Tylenol  pills at once and claimed her doctor has prescribed them which he did not. She also claimed to take Robitussin recently. Respondent is very paranoid, claims there are people out to get her. Is currently under the influence of unknown substances. Is not aware of her environment or surroundings. Respondent not tending to hygiene. Respondent has been committed previously. Is a danger to herself   Patient's mental health provider Erminio Cone [phone] was  contacted and reported  that patient has been refusing to take her medications. When provider visited her in the community, she noticed that patient's thought process was altered and noted that she was decompensating. Patient has ACTT services through [facility] wit Dr Malinda. She noticed that patient's hygiene was decreased. Patient was  very disorganized and paranoid.      Stay Summary: Patient was evaluated here and was noted to be in acute psychosis as evidenced by her disorganized thinking and behaviors, self-care deficiency and her medications non-adherence. She was admitted to Observation unit and inpatient was recommended for stabilization. Home medications were prescribed. She received Olanzapine  5 mg PO for her psychotic symptoms.   Per chart review, Abilify  Phyllistine is her last LAI documented, along with other home  medications. Her ACT team provider was not able to provide medication updates; stated she will ask the nurse to call.  Per  nursing, patient has been laughing to herself.    Assessment: Patient is evaluated face-to-face by this provider. She is in bed awake. Appears bizarre and disheveled. She is preoccupied, frequently laughing to herself, talking to herself. Has poor insight, disoriented to situations. She is not aggressive. She is actively responding to internal stimuli.  Patient continues to report that there is nothing wrong with her, that there is no need for medications. Today, patient's symptoms have intensified as evidenced by talking to self and laughing intensively,  not performing hygiene, poor eye contact and increased anxiety/restlessness. She remains labile and minimizing need for treatment. Patient is guarded and brief due to her preoccupations.  She denies SI/HI.  SABRA  Per med review, the most recent documented home medications include Abilify  Maintena, Benztropin, Klonopin , and Lorazepam . Medications have not been  confirmed with the ACTT nurse yet.    Per SW, patient is accepted at [facility] by Dr Ilsa.   Total Time spent with patient: 30 minutes  Past Psychiatric History: Schizoaffective Disorder Past Medical History: DM, hyperlipidemia Family History: NA Family Psychiatric History: NA Social History: Lives alone. Has   ACT Team services Tobacco Cessation:  N/A, patient does not currently use tobacco products  Current Medications:  Current Facility-Administered Medications  Medication Dose Route Frequency Provider Last Rate Last Admin   acetaminophen  (TYLENOL ) tablet 650 mg  650 mg Oral Q6H PRN Randall Starlyn HERO, NP       alum & mag hydroxide-simeth (MAALOX/MYLANTA) 200-200-20 MG/5ML suspension 30 mL  30 mL Oral Q4H PRN Randall, Avonell Lenig M, NP       atorvastatin  (LIPITOR) tablet 20 mg  20 mg Oral Daily Randall Starlyn  M, NP   20 mg at 01/09/24 1047   losartan  (COZAAR ) tablet 25 mg  25 mg Oral Daily Myalee Stengel M, NP   25 mg at 01/09/24 1047   magnesium  hydroxide (MILK OF  MAGNESIA) suspension 30 mL  30 mL Oral Daily PRN Randall Starlyn HERO, NP       OLANZapine  (ZYPREXA ) injection 10 mg  10 mg Intramuscular TID PRN Randall Starlyn HERO, NP       OLANZapine  (ZYPREXA ) injection 5 mg  5 mg Intramuscular TID PRN Randall Starlyn HERO, NP       OLANZapine  zydis (ZYPREXA ) disintegrating tablet 5 mg  5 mg Oral TID PRN Randall Starlyn HERO, NP       Current Outpatient Medications  Medication Sig Dispense Refill   Accu-Chek FastClix Lancets MISC Use as instructed. Check blood glucose level by fingerstick twice per day. 100 each 3   ARIPiprazole  ER (ABILIFY  MAINTENA) 400 MG SRER injection Inject 2 mLs (400 mg total) into the muscle every 28 (twenty-eight) days. Due 8/25 (Patient taking differently: Inject 400 mg into the muscle every 28 (twenty-eight) days.) 1 each 11   atorvastatin  (LIPITOR) 20 MG tablet Take 1 tablet (20 mg total) by mouth daily. 90 tablet 1   benztropine  (COGENTIN ) 1 MG tablet Take 1 mg by mouth 2 (two) times daily.     Blood Glucose Monitoring Suppl (ACCU-CHEK GUIDE ME) w/Device KIT Use to check blood sugar twice per day 1 kit 0   clonazePAM  (KLONOPIN ) 0.5 MG tablet Take 0.5 mg by mouth in the morning, at noon, and at bedtime. Take for 5 days starting on 01/04/24 until gone     FEROSUL 325 (65 Fe) MG tablet TAKE ONE TABLET BY MOUTH DAILY 90 tablet 10   glucose blood (ACCU-CHEK GUIDE) test strip Use as instructed. Check blood glucose level by fingerstick twice per day. 100 each 12   Lancets Misc. (ACCU-CHEK FASTCLIX LANCET) KIT Use as instructed. Check blood glucose level by fingerstick twice per day. 1 kit 11   LORazepam  (ATIVAN ) 1 MG tablet Take 1 mg by mouth 2 (two) times daily.     losartan  (COZAAR ) 25 MG tablet Take 1 tablet (25 mg total) by mouth daily. 90 tablet 1   metFORMIN  (GLUCOPHAGE ) 500 MG tablet Take 1.5 tablets (750 mg total) by mouth 2 (two) times daily with a meal. (Patient taking differently: Take 500 mg by mouth in the morning, at  noon, and at bedtime.) 270 tablet 0   pantoprazole (PROTONIX) 40 MG tablet Take 1 tablet (40 mg total) by mouth 2 (two) times daily before a meal for 14 days. 28 tablet 0    PTA Medications:  Facility Ordered Medications  Medication   acetaminophen  (TYLENOL ) tablet 650 mg   alum & mag hydroxide-simeth (MAALOX/MYLANTA) 200-200-20 MG/5ML suspension 30 mL   magnesium  hydroxide (MILK OF MAGNESIA) suspension 30 mL   OLANZapine  zydis (ZYPREXA ) disintegrating tablet 5 mg   OLANZapine  (ZYPREXA ) injection 5 mg   OLANZapine  (ZYPREXA ) injection 10 mg   [COMPLETED] OLANZapine  zydis (ZYPREXA ) disintegrating tablet 5 mg   [COMPLETED] metFORMIN  (GLUCOPHAGE ) tablet 750 mg   losartan  (COZAAR ) tablet 25 mg   atorvastatin  (LIPITOR) tablet 20 mg   PTA Medications  Medication Sig   ARIPiprazole  ER (ABILIFY  MAINTENA) 400 MG SRER injection Inject 2 mLs (400 mg total) into the muscle every 28 (twenty-eight) days. Due 8/25 (Patient taking differently: Inject 400 mg into the muscle every 28 (twenty-eight) days.)   LORazepam  (ATIVAN ) 1 MG  tablet Take 1 mg by mouth 2 (two) times daily.   benztropine  (COGENTIN ) 1 MG tablet Take 1 mg by mouth 2 (two) times daily.   glucose blood (ACCU-CHEK GUIDE) test strip Use as instructed. Check blood glucose level by fingerstick twice per day.   Lancets Misc. (ACCU-CHEK FASTCLIX LANCET) KIT Use as instructed. Check blood glucose level by fingerstick twice per day.   Accu-Chek FastClix Lancets MISC Use as instructed. Check blood glucose level by fingerstick twice per day.   Blood Glucose Monitoring Suppl (ACCU-CHEK GUIDE ME) w/Device KIT Use to check blood sugar twice per day   FEROSUL 325 (65 Fe) MG tablet TAKE ONE TABLET BY MOUTH DAILY   metFORMIN  (GLUCOPHAGE ) 500 MG tablet Take 1.5 tablets (750 mg total) by mouth 2 (two) times daily with a meal. (Patient taking differently: Take 500 mg by mouth in the morning, at noon, and at bedtime.)   atorvastatin  (LIPITOR) 20 MG tablet Take  1 tablet (20 mg total) by mouth daily.   losartan  (COZAAR ) 25 MG tablet Take 1 tablet (25 mg total) by mouth daily.   pantoprazole (PROTONIX) 40 MG tablet Take 1 tablet (40 mg total) by mouth 2 (two) times daily before a meal for 14 days.   clonazePAM  (KLONOPIN ) 0.5 MG tablet Take 0.5 mg by mouth in the morning, at noon, and at bedtime. Take for 5 days starting on 01/04/24 until gone       01/09/2024   12:37 PM 01/08/2024    3:12 PM 12/04/2023    9:45 AM  Depression screen PHQ 2/9  Decreased Interest 0 0 2  Down, Depressed, Hopeless 0 0 1  PHQ - 2 Score 0 0 3  Altered sleeping   1  Tired, decreased energy   2  Change in appetite   0  Feeling bad or failure about yourself    1  Trouble concentrating   1  Moving slowly or fidgety/restless   1  Suicidal thoughts   0  PHQ-9 Score   9  Difficult doing work/chores   Somewhat difficult    Flowsheet Row ED from 01/08/2024 in [facility] ED from 09/25/2023 in [facility] Department at [facility] Admission (Discharged) from [facility]  C-SSRS RISK CATEGORY No Risk No Risk No Risk    Musculoskeletal  Strength & Muscle Tone: within normal limits Gait & Station: normal Patient leans: N/A  Psychiatric Specialty Exam  Presentation  General Appearance:  Bizarre; Disheveled  Eye Contact: Fleeting  Speech: Clear and Coherent  Speech Volume: Increased  Handedness: Right   Mood and Affect  Mood: Anxious; Labile  Affect: Labile   Thought Process  Thought Processes: Disorganized; Irrevelant  Descriptions of Associations:Tangential  Orientation:Partial  Thought Content:Illogical; Obsessions; Perseveration; Tangential  Diagnosis of Schizophrenia or Schizoaffective disorder in past: Yes  Duration of Psychotic Symptoms: Greater than six months   Hallucinations:Hallucinations: Auditory Description of Auditory  Hallucinations: talking to self and laughing loud  Ideas of Reference:Delusions  Suicidal Thoughts:Suicidal Thoughts: No  Homicidal Thoughts:Homicidal Thoughts: No   Sensorium  Memory: Immediate Fair; Recent Poor; Remote Poor  Judgment: Impaired  Insight: Lacking   Executive Functions  Concentration: Poor  Attention Span: Fair; Poor  Recall: Poor  Fund of Knowledge: Fair  Language: Fair   Psychomotor Activity  Psychomotor Activity: Psychomotor Activity: Restlessness   Assets  Assets: Communication Skills; Social Support; Housing   Sleep  Sleep: Sleep:  Fair  No Safety Checks orders active in given range  Nutritional Assessment (For OBS and [facility] admissions only) Has the patient had a weight loss or gain of 10 pounds or more in the last 3 months?: No Has the patient had a decrease in food intake/or appetite?: No Does the patient have dental problems?: No Does the patient have eating habits or behaviors that may be indicators of an eating disorder including binging or inducing vomiting?: No Has the patient recently lost weight without trying?: 0 Has the patient been eating poorly because of a decreased appetite?: 0 Malnutrition Screening Tool Score: 0    Physical Exam  Physical Exam Vitals and nursing note reviewed.  Constitutional:      Appearance: She is obese.  HENT:     Head: Normocephalic and atraumatic.     Right Ear: Tympanic membrane normal.     Left Ear: Tympanic membrane normal.     Nose: Nose normal.     Mouth/Throat:     Mouth: Mucous membranes are moist.  Eyes:     Extraocular Movements: Extraocular movements intact.     Pupils: Pupils are equal, round, and reactive to light.  Cardiovascular:     Rate and Rhythm: Normal rate.     Pulses: Normal pulses.  Pulmonary:     Effort: Pulmonary effort is normal.  Musculoskeletal:        General: Normal range of motion.     Cervical back: Normal range of motion and neck supple.   Neurological:     General: No focal deficit present.     Mental Status: She is alert.    Review of Systems  Constitutional: Negative.   HENT: Negative.    Eyes: Negative.   Respiratory: Negative.    Cardiovascular: Negative.   Gastrointestinal: Negative.   Genitourinary: Negative.   Musculoskeletal: Negative.   Skin: Negative.   Neurological: Negative.   Endo/Heme/Allergies: Negative.   Psychiatric/Behavioral:  Positive for hallucinations. The patient is nervous/anxious.    Blood pressure (!) 140/98, pulse 100, temperature 98.4 F (36.9 C), temperature source Oral, resp. rate 18, SpO2 100%. There is no height or weight on file to calculate BMI.  Demographic Factors:  Low socioeconomic status and Living alone  Loss Factors: NA  Historical Factors: NA  Risk Reduction Factors:   Positive social support  Continued Clinical Symptoms:  Bipolar Disorder:   Mixed State Schizophrenia:   Paranoid or undifferentiated type  Cognitive Features That Contribute To Risk:  None    Suicide Risk:  Minimal: No identifiable suicidal ideation.  Patients presenting with no risk factors but with morbid ruminations; may be classified as minimal risk based on the severity of the depressive symptoms  Plan Of Care/Follow-up recommendations:  Activity:  As tolerated Diet:  Regular  Disposition: Patient has been recommended for inpatient hospitalization. Accepted by DR Ilsa    at [facility].   Randall Bouquet, NP 01/09/2024, 12:38 PM

## 2024-01-09 NOTE — ED Notes (Signed)
CBG: 115 

## 2024-01-09 NOTE — Progress Notes (Signed)
 LCSW Progress Note  [id number]   [name]  01/09/2024  10:05 AM  Description:   Inpatient Psychiatric Referral  Patient was recommended inpatient per Starlyn Patron NP (NP). There are no available beds at [facility], per [facility] Carlo RN). Patient was referred to the following out of network facilities:   Destination  Service Provider Address Phone Fax  [facility]  [address], [place] [address] [phone]  [facility]  [address] [place], [place] [address] [phone]  [facility]  [address] [place] [address] [phone]  [facility]  [address] [place] [address] [phone]  [facility]  [address] [place] [address] [phone]  [facility]  [address], [place] [address] [phone]  [facility]  [address] [place] [address] [phone]  [facility] EFAX  [address] [place], [place] [phone]      Situation ongoing, CSW to continue following and update chart as more information becomes available.     [place] Aramis Zobel, MSW, LCSW  01/09/2024 10:05 AM

## 2024-01-09 NOTE — ED Notes (Signed)
 Patient still holding conversations with self, pacing and oriented to self only. Nutrition and fluids offered, toileting offered while awake. No acute distress noted. Active listening, support and encouragement provided. Routine safety checks conducted according to facility protocol.

## 2024-01-09 NOTE — ED Notes (Signed)
 RN spoke with patient she is oriented to person. Denies intent to harm self/others when asked. Denies A/VH or any physical complaints when asked. No acute distress noted. Active listening, support and encouragement provided. Patient is very boisterous and conversation continuously wandering. Routine safety checks conducted according to facility protocol. Encouraged patient to notify staff if thoughts of harm toward self or others arise.

## 2024-01-09 NOTE — Progress Notes (Signed)
 Pt has been accepted to [facility] on 01/09/2024  Bed assignment: [place] [facility]   Pt meets inpatient criteria per: Starlyn Patron NP  Attending Physician will be: Dr,Buttar  Report can be called to:[phone]  Pt can arrive after Blood Sugar Regulated   Care Team Notified: [facility]  Cherylynn Ernst RN, Damien Fireman RN, Starlyn Patron NP  [place] Anshul Meddings LCSW-A   01/09/2024 10:31 AM

## 2024-01-09 NOTE — ED Notes (Signed)
 Pt observed/assessed in recliner sleeping. RR even and unlabored, appearing in no noted distress. Environmental check complete, will continue to monitor for safety

## 2024-01-09 NOTE — ED Notes (Signed)
 Pt irritable and labile w/ loud speech and inappropriate laughing. Pt responding to internal stimuli.

## 2024-01-09 NOTE — ED Notes (Signed)
 Pt has been laughing to her self all morning.

## 2024-01-09 NOTE — ED Notes (Signed)
 Pt sleeping at present, no distress noted.  Monitoring for safety.

## 2024-01-10 DIAGNOSIS — F259 Schizoaffective disorder, unspecified: Secondary | ICD-10-CM | POA: Diagnosis not present

## 2024-01-10 LAB — GLUCOSE, CAPILLARY: Glucose-Capillary: 168 mg/dL — ABNORMAL HIGH (ref 70–99)

## 2024-01-10 NOTE — ED Notes (Signed)
 Pt requested medication for upset stomach. Maalox provided. Pt tolerated well. Talkative, rambling on incoherantly. Unintelligible conversation but redirectable. Safety maintained.

## 2024-01-10 NOTE — ED Notes (Signed)
 Writer called nurse to nurse report to [employer], RN @ [facility]. Writer was informed by intake specialist that pt will be going to [facility] unit under the care of Dr. Jess. Donnice [place], LCSW made aware via secure chat. Sheriff called for report and states they will call when en route to transport pt to [facility]. Safety maintained.

## 2024-01-10 NOTE — ED Notes (Signed)
 Pt rambling on loudly stating, :my mama is trying to put me in a group home. She is going to hell. I am grown. I don't take medicines for my mental health because of the side effects. They all do the same. The doctors just try to switch them around. They ain't fooling me. Unable to be reoriented to reality. Singing loudly with random loud bouts of unintelligible statements. Will continue to monitor for safety.

## 2024-01-10 NOTE — ED Notes (Signed)
 Patient is transferring to [place] at this time via sheriff. IVC, EMTALA, and other transfer paperwork sent with patient and provided to transport. Patient cooperative at time of transfer. All valuables/belongings sent with patient. Patient in no current distress.

## 2024-01-10 NOTE — ED Notes (Signed)
 RN gave report to Jodie Daring, RN at [facility] for transfer of care. Message left at [facility] transport number for pickup.

## 2024-01-10 NOTE — ED Notes (Signed)
 Patient awake and slightly agitated. No acute distress noted. Active listening, support and encouragement provided. Routine safety checks conducted according to facility protocol.

## 2024-01-10 NOTE — ED Notes (Signed)
 Writer spoke with Luke, RN @[facility] , with updates on pt ETA. Informed Kim that sheriff transport just called stating they were en route to pick pt up currently. Pt notified of updates. Safety maintained.

## 2024-01-10 NOTE — ED Notes (Addendum)
 Patient continue with delusional thinking. Loud in tone, excessive use of profanity talking about her primary care doctor. Pt states, [name] is my brother. Pt reports getting good sleep last night while talking to provider. Pt able to state her full name and DOB. Able to be redirected. No aggressive behaviors noted toward staff. Pt denies SI/HI/AVH. Took am medication from clinical research associate without difficulty. Pt states, I'm not taking any meds for my mental health. Just the ones for my physical. I know my meds. Routine safety checks conducted according to facility protocol. Encouraged patient to notify staff if thoughts of harm toward self or others arise. Patient verbalize understanding and agreement. Will continue to monitor for safety.

## 2024-01-17 ENCOUNTER — Encounter

## 2024-01-29 ENCOUNTER — Other Ambulatory Visit: Payer: Self-pay | Admitting: Nurse Practitioner

## 2024-01-29 DIAGNOSIS — E1165 Type 2 diabetes mellitus with hyperglycemia: Secondary | ICD-10-CM

## 2024-01-29 DIAGNOSIS — Z7984 Long term (current) use of oral hypoglycemic drugs: Secondary | ICD-10-CM

## 2024-01-31 ENCOUNTER — Other Ambulatory Visit: Payer: Self-pay | Admitting: Nurse Practitioner

## 2024-01-31 MED ORDER — METFORMIN HCL ER 750 MG PO TB24: 750.0000 mg | ORAL_TABLET | Freq: Two times a day (BID) | ORAL | 1 refills | Status: AC

## 2024-02-05 ENCOUNTER — Telehealth: Payer: Self-pay

## 2024-02-05 DIAGNOSIS — A048 Other specified bacterial intestinal infections: Secondary | ICD-10-CM

## 2024-02-05 NOTE — Telephone Encounter (Signed)
-----   Message from Nurse Lafontaine B sent at 12/08/2023 11:02 AM EDT ----- Regarding: H pylori diatherix H pylori diatherix due week of 12/12 - need to order / needs follow up with Craig ----- Message ----- From: Mercer Cristino SAILOR, RN Sent: 02/16/2024  12:00 AM EDT To: Cristino SAILOR Mercer, RN Subject: H pylori diatherix                             H pylori diatherix - need to order - Craig

## 2024-02-06 ENCOUNTER — Encounter

## 2024-02-07 NOTE — Telephone Encounter (Signed)
Called and spoke with patient to remind her that she is due for [employer] H. Pylori stool test at this time. Pt will stop by the 2nd floor to pick up [employer] H. Pylori kit and instructions. Patient verbalized understanding and had no concerns at the end of the call.    Misc order in epic.  Letter with instructions printed and placed with [employer] H. Pylori stool kit at 2nd floor receptionist desk. Demographics and insurance information attached with order.

## 2024-02-20 ENCOUNTER — Other Ambulatory Visit: Payer: Self-pay

## 2024-02-20 DIAGNOSIS — K529 Noninfective gastroenteritis and colitis, unspecified: Secondary | ICD-10-CM

## 2024-02-27 ENCOUNTER — Encounter

## 2024-03-11 ENCOUNTER — Telehealth: Payer: Self-pay

## 2024-03-11 NOTE — Telephone Encounter (Signed)
 Unable to reach pt at this time nor leave a voice message due to pt mailbox being full at this time.

## 2024-03-11 NOTE — Telephone Encounter (Signed)
-----   Message from Nurse Cristino NOVAK, RN sent at 03/09/2024  8:12 AM EST ----- Regarding: FW: 36-month fecal cal  ----- Message ----- From: Mercer Cristino SAILOR, RN Sent: 03/09/2024  12:00 AM EST To: Cristino SAILOR Mercer, RN Subject: 62-month fecal cal                              Fecal calprotectin - need to order

## 2024-03-11 NOTE — Telephone Encounter (Signed)
 Pt was made aware of the request to repeat the Fecal Cal.  Pt stated that she has picked up the kit.  Orders for the lab provided to pt. Pt verbalized understanding with all questions answered.

## 2024-03-12 NOTE — Progress Notes (Signed)
 I agree with the assessment and plan as outlined by Ms. Craig.

## 2024-03-13 ENCOUNTER — Ambulatory Visit
Admission: RE | Admit: 2024-03-13 | Discharge: 2024-03-13 | Disposition: A | Discharge status: Home / Self Care | Source: Ambulatory Visit | Attending: Nurse Practitioner | Admitting: Nurse Practitioner

## 2024-03-13 ENCOUNTER — Other Ambulatory Visit: Payer: Self-pay | Admitting: Nurse Practitioner

## 2024-03-13 ENCOUNTER — Ambulatory Visit: Payer: Self-pay | Admitting: Nurse Practitioner

## 2024-03-13 DIAGNOSIS — R921 Mammographic calcification found on diagnostic imaging of breast: Secondary | ICD-10-CM

## 2024-03-14 ENCOUNTER — Other Ambulatory Visit

## 2024-03-14 DIAGNOSIS — K529 Noninfective gastroenteritis and colitis, unspecified: Secondary | ICD-10-CM

## 2024-03-16 LAB — CALPROTECTIN, FECAL: Calprotectin, Fecal: 2740 ug/g — ABNORMAL HIGH (ref 0–120)

## 2024-03-18 ENCOUNTER — Ambulatory Visit: Payer: Self-pay | Admitting: Physician Assistant

## 2024-03-18 ENCOUNTER — Ambulatory Visit: Admitting: Physician Assistant

## 2024-03-18 NOTE — Progress Notes (Unsigned)
 "    03/18/2024 [name] [id number] 1975-10-15  Referring provider: Theotis Haze ORN, NP Primary GI doctor: Dr. Federico  ASSESSMENT AND PLAN:  Colitis on colonoscopy Patient has very rare NSAID use ( once a month), rare ETOH (every few months) and denies symptoms of colitis, no rashes, no joint pain, no family history of IBD 09/12/2023 colonoscopy Dr. Federico for screening purposes excellent bowel prep normal TI, diverticulosis sigmoid descending colon, localized scattered inflammation found in sigmoid colon and descending colon, nonbleeding internal hemorrhoids.   Pathology showed chronic active colitis with mild to moderate activity negative for dysplasia recall colonoscopy 1 year 09/25/2023 CT renal stone colonic diverticulosis without diverticulitis no evidence of colitis, 1.2 cm right renal lesion needs MRI renal protocol 12/14/2023 MR abdomen with and without moderate diffuse hepatic steatosis bowels unremarkable 12/06/2023 fecal calprotectin 288 03/14/2024 fecal calprotectin 2740  Uncertain if this is potentially very early IBD without symptoms versus NSAIDs/medication effect but patient has no symptoms at this time so we will check stool for inflammation get MRI abdomen for renal lesion but also evaluate bowel and monitor closely -Pending fecal calprotectin (declines further blood work on yesterday with primary care) - Avoid NSAIDs - Alert our office if any changes in symptoms bowels etc.  Iron  deficiency anemia 09/25/2023  HGB 11.0 MCV 83.9 Platelets 364 06/02/2023 Iron  28 Ferritin 40  Recent Labs    06/02/23 1041 09/25/23 1450 12/04/23 1044 01/08/24 1627  HGB 11.2 11.0* 11.4 12.4  On an oral iron , likely from menses 12/07/2023 H. pylori gastritis treated with quadruple therapy -Continue to monitor -Consider EGD  H. pylori infection 12/07/2023 H. pylori gastritis treated with quadruple therapy Get eradication study  Hepatic steatosis seen on MR abdomen with without  contrast    Latest Ref Rng & Units 01/08/2024    4:27 PM 12/04/2023   10:44 AM 09/25/2023    2:50 PM  Hepatic Function  Total Protein 6.5 - 8.1 g/dL 8.8  8.0  8.7   Albumin 3.5 - 5.0 g/dL 4.2  4.4  3.9   AST 15 - 41 U/L 29  14  34   ALT 0 - 44 U/L 20  14  22    Alk Phosphatase 38 - 126 U/L 55  74  60   Total Bilirubin 0.0 - 1.2 mg/dL 0.7  0.2  1.5   Bilirubin, Direct 0.0 - 0.2 mg/dL   0.5    Platelets 664  Without LFT elevation   Bipolar disorder, cannabis use Denies tobacco use or drug use   Patient Care Team: Theotis Haze ORN, NP as PCP - General (Nurse Practitioner)  HISTORY OF PRESENT ILLNESS: 49 y.o. female with a past medical history listed below presents for evaluation of colitis on colonoscopy.   Discussed the use of AI scribe software for clinical note transcription with the patient, who gave verbal consent to proceed.  History of Present Illness   [name] is a 49 year old female who presents for follow-up after a colonoscopy showing diverticulosis, internal hemorrhoids, and inflammation in the sigmoid and descending colon.  She underwent a colonoscopy on July 8th for screening purposes, which revealed diverticulosis, internal hemorrhoids, and inflammation in the sigmoid and descending colon. Biopsies were taken during the colonoscopy, and showed active chronic colitis but no cancer or dysplasia.  She does not regularly take NSAIDs such as Aleve, ibuprofen , or Goody Powders, only using them rarely for headaches. She did not experience any significant pain or injury prior to  the colonoscopy.  Post-procedure, she experienced some back and side pain but feels fine now. No changes in bowel habits, diarrhea, constipation, blood in stool, or abdominal pain. She has regular bowel movements every morning.  No weight loss, rashes, joint pain, nausea, vomiting, or dark black stools, although she takes iron  pills. She experiences occasional heartburn but does not take  medication for it.  There is no family history of autoimmune diseases like Crohn's or ulcerative colitis. She consumes alcohol very infrequently, approximately every four to five months, and does not use tobacco or drugs.     She  reports that she has quit smoking. Her smoking use included cigarettes. She has a 1 pack-year smoking history. She has never used smokeless tobacco. She reports that she does not currently use alcohol. She reports that she does not currently use drugs after having used the following drugs: Marijuana.  RELEVANT GI HISTORY, IMAGING AND LABS: Results          CBC    Component Value Date/Time   WBC 7.5 01/08/2024 1627   RBC 4.43 01/08/2024 1627   HGB 12.4 01/08/2024 1627   HGB 11.4 12/04/2023 1044   HCT 37.2 01/08/2024 1627   HCT 35.8 12/04/2023 1044   PLT 335 01/08/2024 1627   PLT 371 12/04/2023 1044   MCV 84.0 01/08/2024 1627   MCV 87 12/04/2023 1044   MCH 28.0 01/08/2024 1627   MCHC 33.3 01/08/2024 1627   RDW 15.5 01/08/2024 1627   RDW 15.4 12/04/2023 1044   LYMPHSABS 1.9 01/08/2024 1627   LYMPHSABS 2.6 12/04/2023 1044   MONOABS 0.7 01/08/2024 1627   EOSABS 0.2 01/08/2024 1627   EOSABS 0.1 12/04/2023 1044   BASOSABS 0.0 01/08/2024 1627   BASOSABS 0.0 12/04/2023 1044   Recent Labs    06/02/23 1041 09/25/23 1450 12/04/23 1044 01/08/24 1627  HGB 11.2 11.0* 11.4 12.4    CMP     Component Value Date/Time   NA 134 (L) 01/08/2024 1627   NA 136 12/04/2023 1044   K 3.7 01/08/2024 1627   CL 102 01/08/2024 1627   CO2 18 (L) 01/08/2024 1627   GLUCOSE 215 (H) 01/08/2024 1627   BUN 31 (H) 01/08/2024 1627   BUN 17 12/04/2023 1044   CREATININE 1.60 (H) 01/08/2024 1627   CALCIUM  9.1 01/08/2024 1627   PROT 8.8 (H) 01/08/2024 1627   PROT 8.0 12/04/2023 1044   ALBUMIN 4.2 01/08/2024 1627   ALBUMIN 4.4 12/04/2023 1044   AST 29 01/08/2024 1627   ALT 20 01/08/2024 1627   ALKPHOS 55 01/08/2024 1627   BILITOT 0.7 01/08/2024 1627   BILITOT 0.2  12/04/2023 1044   GFRNONAA 40 (L) 01/08/2024 1627   GFRAA 58 (L) 09/27/2018 0655      Latest Ref Rng & Units 01/08/2024    4:27 PM 12/04/2023   10:44 AM 09/25/2023    2:50 PM  Hepatic Function  Total Protein 6.5 - 8.1 g/dL 8.8  8.0  8.7   Albumin 3.5 - 5.0 g/dL 4.2  4.4  3.9   AST 15 - 41 U/L 29  14  34   ALT 0 - 44 U/L 20  14  22    Alk Phosphatase 38 - 126 U/L 55  74  60   Total Bilirubin 0.0 - 1.2 mg/dL 0.7  0.2  1.5   Bilirubin, Direct 0.0 - 0.2 mg/dL   0.5       Current Medications:   Current Outpatient Medications (Endocrine & Metabolic):  metFORMIN  (GLUCOPHAGE -XR) 750 MG 24 hr tablet, Take 1 tablet (750 mg total) by mouth 2 (two) times daily. NEW DOSE.  Current Outpatient Medications (Cardiovascular):    atorvastatin  (LIPITOR) 20 MG tablet, Take 1 tablet (20 mg total) by mouth daily.   losartan  (COZAAR ) 25 MG tablet, Take 1 tablet (25 mg total) by mouth daily.  Current Outpatient Medications (Hematological):    FEROSUL 325 (65 Fe) MG tablet, TAKE ONE TABLET BY MOUTH DAILY  Current Outpatient Medications (Other):    Accu-Chek FastClix Lancets MISC, Use as instructed. Check blood glucose level by fingerstick twice per day.   ARIPiprazole  ER (ABILIFY  MAINTENA) 400 MG SRER injection, Inject 2 mLs (400 mg total) into the muscle every 28 (twenty-eight) days. Due 8/25 (Patient taking differently: Inject 400 mg into the muscle every 28 (twenty-eight) days.)   benztropine  (COGENTIN ) 1 MG tablet, Take 1 mg by mouth 2 (two) times daily.   Blood Glucose Monitoring Suppl (ACCU-CHEK GUIDE ME) w/Device KIT, Use to check blood sugar twice per day   clonazePAM  (KLONOPIN ) 0.5 MG tablet, Take 0.5 mg by mouth in the morning, at noon, and at bedtime. Take for 5 days starting on 01/04/24 until gone   glucose blood (ACCU-CHEK GUIDE) test strip, Use as instructed. Check blood glucose level by fingerstick twice per day.   Lancets Misc. (ACCU-CHEK FASTCLIX LANCET) KIT, Use as instructed. Check  blood glucose level by fingerstick twice per day.   LORazepam  (ATIVAN ) 1 MG tablet, Take 1 mg by mouth 2 (two) times daily.   pantoprazole  (PROTONIX ) 40 MG tablet, Take 1 tablet (40 mg total) by mouth 2 (two) times daily before a meal for 14 days.  Medical History:  Past Medical History:  Diagnosis Date   Anxiety    Bipolar affective (HCC)    Depression    Diabetes mellitus without complication (HCC)    Hyperlipidemia    Hypertension    OCD (obsessive compulsive disorder)    Schizoaffective disorder (HCC) 2000   Allergies:  Allergies  Allergen Reactions   Geodon  [Ziprasidone ] Anaphylaxis and Other (See Comments)    EPS symptoms   Haldol  [Haloperidol ] Other (See Comments)    Lock-jaw, EPS symptoms   Risperidone  And Paliperidone  Other (See Comments)    EPS symptoms   Valproic Acid  Other (See Comments)    Reaction unknown     Surgical History:  She  has a past surgical history that includes no past surgery. Family History:  Her family history includes Diabetes in her mother; Drug abuse in her father; Heart attack in her father; Hyperthyroidism in her mother; Mental illness in an other family member.  REVIEW OF SYSTEMS  : All other systems reviewed and negative except where noted in the History of Present Illness.  PHYSICAL EXAM: There were no vitals taken for this visit. Physical Exam          Alan JONELLE Coombs, PA-C 7:55 AM   "

## 2024-03-19 ENCOUNTER — Other Ambulatory Visit: Payer: Self-pay

## 2024-03-19 DIAGNOSIS — R195 Other fecal abnormalities: Secondary | ICD-10-CM

## 2024-04-02 ENCOUNTER — Ambulatory Visit

## 2024-04-02 ENCOUNTER — Telehealth: Payer: Self-pay

## 2024-04-02 NOTE — Telephone Encounter (Signed)
-----   Message from Nurse Elspeth RAMAN, RN sent at 03/19/2024  9:42 AM EST ----- Personal reminder placed 03/19/2024 Alan Coombs PA   repeating fecal calprotectin with CRP and sed rate in 2 to 4 weeks.

## 2024-04-02 NOTE — Telephone Encounter (Signed)
 Reminder was received in Epic.  Pt made aware of the labs that Alan Coombs is requesting.  Pt verbalized understanding with all questions answered.

## 2024-04-08 ENCOUNTER — Ambulatory Visit: Admitting: Physician Assistant

## 2024-04-26 ENCOUNTER — Ambulatory Visit: Admitting: Physician Assistant

## 2024-05-21 ENCOUNTER — Ambulatory Visit

## 2024-06-11 ENCOUNTER — Ambulatory Visit: Admitting: Nurse Practitioner
